# Patient Record
Sex: Female | Born: 1990 | Race: Black or African American | Hispanic: No | Marital: Single | Smoking: Former smoker
Health system: Southern US, Community
[De-identification: ages and names within clinical notes are randomized; demographics above are authoritative.]

## PROBLEM LIST (undated history)

## (undated) DIAGNOSIS — J45909 Unspecified asthma, uncomplicated: Secondary | ICD-10-CM

## (undated) DIAGNOSIS — I10 Essential (primary) hypertension: Secondary | ICD-10-CM

## (undated) DIAGNOSIS — N83209 Unspecified ovarian cyst, unspecified side: Secondary | ICD-10-CM

## (undated) DIAGNOSIS — K2970 Gastritis, unspecified, without bleeding: Secondary | ICD-10-CM

## (undated) DIAGNOSIS — N390 Urinary tract infection, site not specified: Secondary | ICD-10-CM

## (undated) HISTORY — DX: Urinary tract infection, site not specified: N39.0

## (undated) HISTORY — PX: NO PAST SURGERIES: SHX2092

## (undated) HISTORY — DX: Essential (primary) hypertension: I10

---
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
## 2009-08-26 ENCOUNTER — Emergency Department (HOSPITAL_COMMUNITY): Admission: EM | Admit: 2009-08-26 | Discharge: 2009-08-26 | Payer: Self-pay | Admitting: Emergency Medicine

## 2010-09-20 LAB — DIFFERENTIAL
Basophils Absolute: 0.1 10*3/uL (ref 0.0–0.1)
Basophils Relative: 0 % (ref 0–1)
Eosinophils Absolute: 0 10*3/uL (ref 0.0–0.7)
Eosinophils Relative: 0 % (ref 0–5)
Lymphocytes Relative: 3 % — ABNORMAL LOW (ref 12–46)
Lymphs Abs: 0.6 10*3/uL — ABNORMAL LOW (ref 0.7–4.0)
Monocytes Absolute: 0.8 10*3/uL (ref 0.1–1.0)
Monocytes Relative: 4 % (ref 3–12)
Neutro Abs: 19 10*3/uL — ABNORMAL HIGH (ref 1.7–7.7)
Neutrophils Relative %: 93 % — ABNORMAL HIGH (ref 43–77)

## 2010-09-20 LAB — URINALYSIS, ROUTINE W REFLEX MICROSCOPIC
Bilirubin Urine: NEGATIVE
Glucose, UA: NEGATIVE mg/dL
Nitrite: NEGATIVE
Protein, ur: 30 mg/dL — AB
Specific Gravity, Urine: 1.02 (ref 1.005–1.030)
Urobilinogen, UA: 0.2 mg/dL (ref 0.0–1.0)
pH: 6 (ref 5.0–8.0)

## 2010-09-20 LAB — WET PREP, GENITAL
Trich, Wet Prep: NONE SEEN
Yeast Wet Prep HPF POC: NONE SEEN

## 2010-09-20 LAB — URINE CULTURE: Colony Count: 30000

## 2010-09-20 LAB — CBC
HCT: 37.8 % (ref 36.0–46.0)
Hemoglobin: 12.2 g/dL (ref 12.0–15.0)
MCHC: 32.3 g/dL (ref 30.0–36.0)
MCV: 81.5 fL (ref 78.0–100.0)
Platelets: 339 10*3/uL (ref 150–400)
RBC: 4.63 MIL/uL (ref 3.87–5.11)
RDW: 13.4 % (ref 11.5–15.5)
WBC: 20.5 10*3/uL — ABNORMAL HIGH (ref 4.0–10.5)

## 2010-09-20 LAB — URINE MICROSCOPIC-ADD ON

## 2010-09-20 LAB — POCT PREGNANCY, URINE: Preg Test, Ur: NEGATIVE

## 2010-09-20 LAB — POCT I-STAT, CHEM 8
BUN: 7 mg/dL (ref 6–23)
Calcium, Ion: 1.13 mmol/L (ref 1.12–1.32)
Chloride: 106 mEq/L (ref 96–112)
Creatinine, Ser: 0.8 mg/dL (ref 0.4–1.2)
Glucose, Bld: 101 mg/dL — ABNORMAL HIGH (ref 70–99)
HCT: 40 % (ref 36.0–46.0)
Hemoglobin: 13.6 g/dL (ref 12.0–15.0)
Potassium: 3.6 mEq/L (ref 3.5–5.1)
Sodium: 139 mEq/L (ref 135–145)
TCO2: 24 mmol/L (ref 0–100)

## 2010-09-20 LAB — GC/CHLAMYDIA PROBE AMP, GENITAL
Chlamydia, DNA Probe: NEGATIVE
GC Probe Amp, Genital: POSITIVE — AB

## 2010-10-14 ENCOUNTER — Emergency Department (HOSPITAL_COMMUNITY)
Admission: EM | Admit: 2010-10-14 | Discharge: 2010-10-14 | Disposition: A | Discharge status: Home / Self Care | Payer: 59 | Attending: Emergency Medicine | Admitting: Emergency Medicine

## 2010-10-14 DIAGNOSIS — N949 Unspecified condition associated with female genital organs and menstrual cycle: Secondary | ICD-10-CM | POA: Insufficient documentation

## 2010-10-14 DIAGNOSIS — N925 Other specified irregular menstruation: Secondary | ICD-10-CM | POA: Insufficient documentation

## 2010-10-14 DIAGNOSIS — N938 Other specified abnormal uterine and vaginal bleeding: Secondary | ICD-10-CM | POA: Insufficient documentation

## 2010-10-14 LAB — WET PREP, GENITAL
Trich, Wet Prep: NONE SEEN
Yeast Wet Prep HPF POC: NONE SEEN

## 2010-10-14 LAB — POCT I-STAT, CHEM 8
BUN: 9 mg/dL (ref 6–23)
Calcium, Ion: 1.19 mmol/L (ref 1.12–1.32)
Chloride: 104 mEq/L (ref 96–112)
Creatinine, Ser: 0.8 mg/dL (ref 0.4–1.2)
Glucose, Bld: 99 mg/dL (ref 70–99)
HCT: 34 % — ABNORMAL LOW (ref 36.0–46.0)
Hemoglobin: 11.6 g/dL — ABNORMAL LOW (ref 12.0–15.0)
Potassium: 4.1 mEq/L (ref 3.5–5.1)
Sodium: 140 mEq/L (ref 135–145)
TCO2: 23 mmol/L (ref 0–100)

## 2010-10-14 LAB — URINALYSIS, ROUTINE W REFLEX MICROSCOPIC
Bilirubin Urine: NEGATIVE
Glucose, UA: NEGATIVE mg/dL
Ketones, ur: NEGATIVE mg/dL
Leukocytes, UA: NEGATIVE
Nitrite: NEGATIVE
Protein, ur: 30 mg/dL — AB
Specific Gravity, Urine: 1.026 (ref 1.005–1.030)
Urobilinogen, UA: 1 mg/dL (ref 0.0–1.0)
pH: 8 (ref 5.0–8.0)

## 2010-10-14 LAB — URINE MICROSCOPIC-ADD ON

## 2010-10-14 LAB — POCT PREGNANCY, URINE: Preg Test, Ur: NEGATIVE

## 2010-10-15 LAB — GC/CHLAMYDIA PROBE AMP, GENITAL
Chlamydia, DNA Probe: NEGATIVE
GC Probe Amp, Genital: NEGATIVE

## 2011-08-14 ENCOUNTER — Encounter: Payer: Self-pay | Admitting: Internal Medicine

## 2011-08-14 DIAGNOSIS — Z0000 Encounter for general adult medical examination without abnormal findings: Secondary | ICD-10-CM | POA: Insufficient documentation

## 2011-08-19 ENCOUNTER — Ambulatory Visit (INDEPENDENT_AMBULATORY_CARE_PROVIDER_SITE_OTHER): Payer: 59 | Admitting: Internal Medicine

## 2011-08-19 ENCOUNTER — Encounter: Payer: Self-pay | Admitting: Internal Medicine

## 2011-08-19 ENCOUNTER — Other Ambulatory Visit (INDEPENDENT_AMBULATORY_CARE_PROVIDER_SITE_OTHER): Payer: 59

## 2011-08-19 VITALS — BP 122/80 | HR 79 | Temp 98.1°F | Ht <= 58 in | Wt 129.5 lb

## 2011-08-19 DIAGNOSIS — Z0000 Encounter for general adult medical examination without abnormal findings: Secondary | ICD-10-CM

## 2011-08-19 LAB — URINALYSIS, ROUTINE W REFLEX MICROSCOPIC
Bilirubin Urine: NEGATIVE
Hgb urine dipstick: NEGATIVE
Ketones, ur: NEGATIVE
Leukocytes, UA: NEGATIVE
Nitrite: POSITIVE
Specific Gravity, Urine: 1.015 (ref 1.000–1.030)
Total Protein, Urine: NEGATIVE
Urine Glucose: NEGATIVE
Urobilinogen, UA: 0.2 (ref 0.0–1.0)
pH: 7 (ref 5.0–8.0)

## 2011-08-19 LAB — BASIC METABOLIC PANEL
BUN: 6 mg/dL (ref 6–23)
CO2: 27 mEq/L (ref 19–32)
Calcium: 9 mg/dL (ref 8.4–10.5)
Chloride: 103 mEq/L (ref 96–112)
Creatinine, Ser: 0.8 mg/dL (ref 0.4–1.2)
GFR: 113.92 mL/min (ref >60.00)
Glucose, Bld: 83 mg/dL (ref 70–99)
Potassium: 4.3 mEq/L (ref 3.5–5.1)
Sodium: 136 mEq/L (ref 135–145)

## 2011-08-19 LAB — HEPATIC FUNCTION PANEL
ALT: 17 U/L (ref 0–35)
AST: 28 U/L (ref 0–37)
Albumin: 4 g/dL (ref 3.5–5.2)
Alkaline Phosphatase: 60 U/L (ref 39–117)
Bilirubin, Direct: 0.1 mg/dL (ref 0.0–0.3)
Total Bilirubin: 0.5 mg/dL (ref 0.3–1.2)
Total Protein: 7.8 g/dL (ref 6.0–8.3)

## 2011-08-19 LAB — CBC WITH DIFFERENTIAL/PLATELET
Basophils Absolute: 0 10*3/uL (ref 0.0–0.1)
Basophils Relative: 0.6 % (ref 0.0–3.0)
Eosinophils Absolute: 0.4 10*3/uL (ref 0.0–0.7)
Eosinophils Relative: 6.1 % — ABNORMAL HIGH (ref 0.0–5.0)
HCT: 35.9 % — ABNORMAL LOW (ref 36.0–46.0)
Hemoglobin: 11.4 g/dL — ABNORMAL LOW (ref 12.0–15.0)
Lymphocytes Relative: 44.5 % (ref 12.0–46.0)
Lymphs Abs: 2.9 10*3/uL (ref 0.7–4.0)
MCHC: 31.6 g/dL (ref 30.0–36.0)
MCV: 76.9 fl — ABNORMAL LOW (ref 78.0–100.0)
Monocytes Absolute: 0.4 10*3/uL (ref 0.1–1.0)
Monocytes Relative: 5.7 % (ref 3.0–12.0)
Neutro Abs: 2.8 10*3/uL (ref 1.4–7.7)
Neutrophils Relative %: 43.1 % (ref 43.0–77.0)
Platelets: 378 10*3/uL (ref 150.0–400.0)
RBC: 4.67 Mil/uL (ref 3.87–5.11)
RDW: 17.6 % — ABNORMAL HIGH (ref 11.5–14.6)
WBC: 6.5 10*3/uL (ref 4.5–10.5)

## 2011-08-19 LAB — LIPID PANEL
Cholesterol: 169 mg/dL (ref 0–200)
HDL: 79.7 mg/dL (ref >39.00)
LDL Cholesterol: 81 mg/dL (ref 0–99)
Total CHOL/HDL Ratio: 2
Triglycerides: 41 mg/dL (ref 0.0–149.0)
VLDL: 8.2 mg/dL (ref 0.0–40.0)

## 2011-08-19 LAB — TSH: TSH: 0.59 u[IU]/mL (ref 0.35–5.50)

## 2011-08-19 NOTE — Progress Notes (Signed)
  Subjective:    Patient ID: Andrea Daniels, female    DOB: 1991-02-02, 21 y.o.   MRN: [id number]  HPI  Here for wellness and f/u;  Overall doing ok;  Pt denies CP, worsening SOB, DOE, wheezing, orthopnea, PND, worsening LE edema, palpitations, dizziness or syncope.  Pt denies neurological change such as new Headache, facial or extremity weakness.  Pt denies polydipsia, polyuria, or low sugar symptoms. Pt states overall good compliance with treatment and medications, good tolerability, and trying to follow lower cholesterol diet.  Pt denies worsening depressive symptoms, suicidal ideation or panic. No fever, wt loss, night sweats, loss of appetite, or other constitutional symptoms.  Pt states good ability with ADL's, low fall risk, home safety reviewed and adequate, no significant changes in hearing or vision, and occasionally active with exercise.  No other complaints.  Due for pap. Past Medical History  Diagnosis Date  . UTI (lower urinary tract infection)    History reviewed. No pertinent past surgical history.  reports that she has been smoking.  She does not have any smokeless tobacco history on file. She reports that she drinks alcohol. She reports that she does not use illicit drugs. family history includes Arthritis in her other; Cancer in her other; Diabetes in her other; Hyperlipidemia in her other; and Hypertension in her others. No Known Allergies No current outpatient prescriptions on file prior to visit.   Review of Systems Review of Systems  Constitutional: Negative for diaphoresis, activity change, appetite change and unexpected weight change.  HENT: Negative for hearing loss, ear pain, facial swelling, mouth sores and neck stiffness.   Eyes: Negative for pain, redness and visual disturbance.  Respiratory: Negative for shortness of breath and wheezing.   Cardiovascular: Negative for chest pain and palpitations.  Gastrointestinal: Negative for diarrhea, blood in stool, abdominal  distention and rectal pain.  Genitourinary: Negative for hematuria, flank pain and decreased urine volume.  Musculoskeletal: Negative for myalgias and joint swelling.  Skin: Negative for color change and wound.  Neurological: Negative for syncope and numbness.  Hematological: Negative for adenopathy.  Psychiatric/Behavioral: Negative for hallucinations, self-injury, decreased concentration and agitation.      Objective:   Physical Exam BP 122/80  Pulse 79  Temp(Src) 98.1 F (36.7 C) (Oral)  Ht 4\' 10"  (1.473 m)  Wt 129 lb 8 oz (58.741 kg)  BMI 27.07 kg/m2  SpO2 98%  LMP 07/29/2011 Physical Exam  VS noted Constitutional: Pt is oriented to person, place, and time. Appears well-developed and well-nourished.  HENT:  Head: Normocephalic and atraumatic.  Right Ear: External ear normal.  Left Ear: External ear normal.  Nose: Nose normal.  Mouth/Throat: Oropharynx is clear and moist.  Eyes: Conjunctivae and EOM are normal. Pupils are equal, round, and reactive to light.  Neck: Normal range of motion. Neck supple. No JVD present. No tracheal deviation present.  Cardiovascular: Normal rate, regular rhythm, normal heart sounds and intact distal pulses.   Pulmonary/Chest: Effort normal and breath sounds normal.  Abdominal: Soft. Bowel sounds are normal. There is no tenderness.  Musculoskeletal: Normal range of motion. Exhibits no edema.  Lymphadenopathy:  Has no cervical adenopathy.  Neurological: Pt is alert and oriented to person, place, and time. Pt has normal reflexes. No cranial nerve deficit.  Skin: Skin is warm and dry. No rash noted.  Psychiatric:  Has  normal mood and affect. Behavior is normal.     Assessment & Plan:

## 2011-08-19 NOTE — Patient Instructions (Addendum)
No new medications or issues today Please remember to followup with your GYN for the yearly pap smear Please stop smoking (you can call for Chantix if you want, as we discussed) You are otherwise up to date with prevention Please go to LAB in the Basement for the blood and/or urine tests to be done today Please call the phone number [phone] (the [employer] System) for results of testing in 2-3 days;  When calling, simply dial the number, and when prompted enter the MRN number above (the Medical Record Number) and the # key, then the message should start. You can return as needed, and remember you are no considered an "active" patient here for the next 3 yrs

## 2011-08-19 NOTE — Assessment & Plan Note (Signed)
Overall doing well, age appropriate education and counseling updated, referrals for preventative services and immunizations addressed, dietary and smoking counseling addressed, most recent labs and ECG reviewed.  I have personally reviewed and have noted: 1) the patient's medical and social history 2) The pt's use of alcohol, tobacco, and illicit drugs 3) The patient's current medications and supplements 4) Functional ability including ADL's, fall risk, home safety risk, hearing and visual impairment 5) Diet and physical activities 6) Evidence for depression or mood disorder 7) The patient's height, weight, and BMI have been recorded in the chart I have made referrals, and provided counseling and education based on review of the above Due for pap, pt to call GYN.  Also for chantix for smoking if she requests, d/w pt today.

## 2012-03-03 ENCOUNTER — Emergency Department (HOSPITAL_BASED_OUTPATIENT_CLINIC_OR_DEPARTMENT_OTHER)
Admission: EM | Admit: 2012-03-03 | Discharge: 2012-03-03 | Disposition: A | Discharge status: Home / Self Care | Payer: 59 | Attending: Emergency Medicine | Admitting: Emergency Medicine

## 2012-03-03 ENCOUNTER — Encounter (HOSPITAL_BASED_OUTPATIENT_CLINIC_OR_DEPARTMENT_OTHER): Payer: Self-pay | Admitting: Emergency Medicine

## 2012-03-03 DIAGNOSIS — F17200 Nicotine dependence, unspecified, uncomplicated: Secondary | ICD-10-CM | POA: Insufficient documentation

## 2012-03-03 DIAGNOSIS — Z853 Personal history of malignant neoplasm of breast: Secondary | ICD-10-CM | POA: Insufficient documentation

## 2012-03-03 DIAGNOSIS — E119 Type 2 diabetes mellitus without complications: Secondary | ICD-10-CM | POA: Insufficient documentation

## 2012-03-03 DIAGNOSIS — I10 Essential (primary) hypertension: Secondary | ICD-10-CM | POA: Insufficient documentation

## 2012-03-03 DIAGNOSIS — A499 Bacterial infection, unspecified: Secondary | ICD-10-CM | POA: Insufficient documentation

## 2012-03-03 DIAGNOSIS — N760 Acute vaginitis: Secondary | ICD-10-CM | POA: Insufficient documentation

## 2012-03-03 DIAGNOSIS — B9689 Other specified bacterial agents as the cause of diseases classified elsewhere: Secondary | ICD-10-CM | POA: Insufficient documentation

## 2012-03-03 DIAGNOSIS — N946 Dysmenorrhea, unspecified: Secondary | ICD-10-CM | POA: Insufficient documentation

## 2012-03-03 LAB — CBC WITH DIFFERENTIAL/PLATELET
Basophils Absolute: 0.1 10*3/uL (ref 0.0–0.1)
Basophils Relative: 1 % (ref 0–1)
Eosinophils Absolute: 0.3 10*3/uL (ref 0.0–0.7)
Eosinophils Relative: 5 % (ref 0–5)
HCT: 35.4 % — ABNORMAL LOW (ref 36.0–46.0)
Hemoglobin: 12.1 g/dL (ref 12.0–15.0)
Lymphocytes Relative: 23 % (ref 12–46)
Lymphs Abs: 1.5 10*3/uL (ref 0.7–4.0)
MCH: 26.5 pg (ref 26.0–34.0)
MCHC: 34.2 g/dL (ref 30.0–36.0)
MCV: 77.6 fL — ABNORMAL LOW (ref 78.0–100.0)
Monocytes Absolute: 0.4 10*3/uL (ref 0.1–1.0)
Monocytes Relative: 7 % (ref 3–12)
Neutro Abs: 4.1 10*3/uL (ref 1.7–7.7)
Neutrophils Relative %: 64 % (ref 43–77)
Platelets: 156 10*3/uL (ref 150–400)
RBC: 4.56 MIL/uL (ref 3.87–5.11)
RDW: 13.9 % (ref 11.5–15.5)
WBC: 6.4 10*3/uL (ref 4.0–10.5)

## 2012-03-03 LAB — BASIC METABOLIC PANEL
BUN: 7 mg/dL (ref 6–23)
CO2: 20 mEq/L (ref 19–32)
Calcium: 8.3 mg/dL — ABNORMAL LOW (ref 8.4–10.5)
Chloride: 105 mEq/L (ref 96–112)
Creatinine, Ser: 0.8 mg/dL (ref 0.50–1.10)
GFR calc Af Amer: >90 mL/min (ref >90)
GFR calc non Af Amer: >90 mL/min (ref >90)
Glucose, Bld: 105 mg/dL — ABNORMAL HIGH (ref 70–99)
Potassium: 4.3 mEq/L (ref 3.5–5.1)
Sodium: 138 mEq/L (ref 135–145)

## 2012-03-03 LAB — URINALYSIS, ROUTINE W REFLEX MICROSCOPIC
Bilirubin Urine: NEGATIVE
Glucose, UA: NEGATIVE mg/dL
Ketones, ur: NEGATIVE mg/dL
Leukocytes, UA: NEGATIVE
Nitrite: NEGATIVE
Protein, ur: NEGATIVE mg/dL
Specific Gravity, Urine: 1.012 (ref 1.005–1.030)
Urobilinogen, UA: 0.2 mg/dL (ref 0.0–1.0)
pH: 7 (ref 5.0–8.0)

## 2012-03-03 LAB — URINE MICROSCOPIC-ADD ON

## 2012-03-03 LAB — WET PREP, GENITAL
Trich, Wet Prep: NONE SEEN
Yeast Wet Prep HPF POC: NONE SEEN

## 2012-03-03 LAB — PREGNANCY, URINE: Preg Test, Ur: NEGATIVE

## 2012-03-03 LAB — LACTIC ACID, PLASMA: Lactic Acid, Venous: 3.1 mmol/L — ABNORMAL HIGH (ref 0.5–2.2)

## 2012-03-03 MED ORDER — ONDANSETRON HCL 4 MG/2ML IJ SOLN
4.0000 mg | Freq: Once | INTRAMUSCULAR | Status: AC
  Administered 2012-03-03: 4 mg via INTRAVENOUS
  Filled 2012-03-03: qty 2

## 2012-03-03 MED ORDER — IBUPROFEN 800 MG PO TABS: 800.0000 mg | ORAL_TABLET | Freq: Three times a day (TID) | ORAL | Status: AC

## 2012-03-03 MED ORDER — ONDANSETRON HCL 4 MG/2ML IJ SOLN
INTRAMUSCULAR | Status: AC
  Administered 2012-03-03: 4 mg via INTRAVENOUS
  Filled 2012-03-03: qty 2

## 2012-03-03 MED ORDER — SODIUM CHLORIDE 0.9 % IV BOLUS (SEPSIS)
1000.0000 mL | Freq: Once | INTRAVENOUS | Status: AC
  Administered 2012-03-03: 1000 mL via INTRAVENOUS

## 2012-03-03 MED ORDER — METRONIDAZOLE 500 MG PO TABS: 500.0000 mg | ORAL_TABLET | Freq: Two times a day (BID) | ORAL | Status: AC

## 2012-03-03 MED ORDER — KETOROLAC TROMETHAMINE 30 MG/ML IJ SOLN
30.0000 mg | Freq: Once | INTRAMUSCULAR | Status: AC
  Administered 2012-03-03: 30 mg via INTRAVENOUS
  Filled 2012-03-03: qty 1

## 2012-03-03 NOTE — ED Notes (Addendum)
Patient c/o severe menstrual cramps, pelvic pain, states she has a Hx of severe cramps and today is the worse.

## 2012-03-05 LAB — GC/CHLAMYDIA PROBE AMP, GENITAL
Chlamydia, DNA Probe: NEGATIVE
GC Probe Amp, Genital: NEGATIVE

## 2012-03-08 NOTE — ED Provider Notes (Signed)
History     CSN: [id number]  Arrival date & time 03/03/12  1359   First MD Initiated Contact with Patient 03/03/12 1416      Chief Complaint  Patient presents with  . Menstrual Problem    (Consider location/radiation/quality/duration/timing/severity/associated sxs/prior treatment) HPI Patient presented by EMS shivering and complaining of 10/10 bilateral lower abdominal menstrual cramps beginning 2 hours to presentation.  Shivering was distractible and abnormal initial vital signs taken during extreme patient behavior normalized with patient relaxation and repeated measurement.  Cessation of shaking movements and vital sign improvement were precipitated by notifying patient of need to perform rectal temp if chattering of teeth could not cease to perform oral temp.  Patient reported taking midol at home without relief.  She endorsed history of anemia but has never received blood transfusion for this.  Patient denied vaginal discharge other than menstrual bleeding and denied possibility of pregnancy.  Endorsed nausea but denied vomiting.  Denied other GI symptoms and urinary symptoms.  No recent sick contacts, cough, fever, chest pain, or shortness of breath.  Nothing makes pain better and palpation worsens it.  No radiation of pain.  There are no other associated or modifying factors.  Past Medical History  Diagnosis Date  . UTI (lower urinary tract infection)     History reviewed. No pertinent past surgical history.  Family History  Problem Relation Age of Onset  . Hyperlipidemia Other   . Hypertension Other   . Arthritis Other   . Cancer Other     breast cancer  . Hypertension Other   . Diabetes Other     History  Substance Use Topics  . Smoking status: Current Every Day Smoker  . Smokeless tobacco: Not on file  . Alcohol Use: Yes    OB History    Grav Para Term Preterm Abortions TAB SAB Ect Mult Living                  Review of Systems  Constitutional: Positive for  chills.  HENT: Negative.   Eyes: Negative.   Respiratory: Negative.   Cardiovascular: Negative.   Gastrointestinal: Positive for abdominal pain.  Genitourinary: Positive for vaginal bleeding and menstrual problem.  Musculoskeletal: Negative.   Skin: Negative.   Neurological: Negative.   Hematological: Negative.   Psychiatric/Behavioral: Negative.   All other systems reviewed and are negative.    Allergies  Review of patient's allergies indicates no known allergies.  Home Medications   Current Outpatient Rx  Name Route Sig Dispense Refill  . IBUPROFEN 200 MG PO TABS Oral Take 400-600 mg by mouth as needed.     . IBUPROFEN 800 MG PO TABS Oral Take 1 tablet (800 mg total) by mouth 3 (three) times daily. 30 tablet 0    Please take on a regular basis during your menstru ...  . METRONIDAZOLE 500 MG PO TABS Oral Take 1 tablet (500 mg total) by mouth 2 (two) times daily. 14 tablet 0    BP 124/76  Pulse 74  Temp 97.4 F (36.3 C) (Oral)  Resp 20  SpO2 100%  Physical Exam  Nursing note and vitals reviewed. GEN: Well-developed, well-nourished female shaking on the stretcher and minimally cooperative initially with improved verbal response and cessation of shaking upon notification of possible need for rectal temp HEENT: Atraumatic, normocephalic. Oropharynx clear without erythema EYES: PERRLA BL, no scleral icterus. NECK: Trachea midline, no meningismus CV: regular rate and rhythm. No murmurs, rubs, or gallops PULM: No respiratory distress.  No crackles, wheezes, or rales. GI: soft, mild BL lower quadrant TTP. No guarding, rebound, or distension. + bowel sounds  GU: moderate vaginal bleeding, no CMT or adnexal TTP, specimens collected Neuro: cranial nerves grossly 2-12 intact, no abnormalities of strength or sensation, A and O x 3 MSK: Patient moves all 4 extremities symmetrically, no deformity, edema, or injury noted Skin: No rashes petechiae, purpura, or jaundice Psych: no  abnormality of mood   ED Course  Procedures (including critical care time)  Labs Reviewed  CBC WITH DIFFERENTIAL - Abnormal; Notable for the following:    HCT 35.4 (*)     MCV 77.6 (*)     All other components within normal limits  BASIC METABOLIC PANEL - Abnormal; Notable for the following:    Glucose, Bld 105 (*)     Calcium 8.3 (*)     All other components within normal limits  URINALYSIS, ROUTINE W REFLEX MICROSCOPIC - Abnormal; Notable for the following:    Hgb urine dipstick LARGE (*)     All other components within normal limits  LACTIC ACID, PLASMA - Abnormal; Notable for the following:    Lactic Acid, Venous 3.1 (*)     All other components within normal limits  WET PREP, GENITAL - Abnormal; Notable for the following:    Clue Cells Wet Prep HPF POC FEW (*)     WBC, Wet Prep HPF POC FEW (*)     All other components within normal limits  URINE MICROSCOPIC-ADD ON - Abnormal; Notable for the following:    Bacteria, UA FEW (*)     All other components within normal limits  PREGNANCY, URINE  GC/CHLAMYDIA PROBE AMP, GENITAL   No results found.   1. BV (bacterial vaginosis)   2. Menstrual cramps       MDM  Patient evaluated by myself and treated initially with IVF.  No anemia, leukocytosis, electrolyte abnormalities noted on labs.  Inadvertently elevated lactate performed and elevated.  Likely due to patient repeated shaking movements vs lab error.  Not repeated as vitals and patient condition continued to improve.  Patient received a total of 2 L of NS and pain treated with toradol after u preg neg.  RBCs on UA due to menstrual blood.  Pelvic unremarkable aside from menstrual bleeding.  Patient had clue cells noted on exam but denied vaginal discharge.  Discharged with Rx for flagyl in case d/c noted after completion of period.  Patient advised to follow-up with her OB and to start taking ibuprofen 800 mg po TID 2 days before her period is scheduled to start in the future  to prevent severe cramping.  Patient stated understanding and was discharged in good condition.        Cyndra Numbers, MD 03/08/12 1225

## 2012-04-24 ENCOUNTER — Ambulatory Visit: Payer: 59 | Admitting: Internal Medicine

## 2012-04-24 ENCOUNTER — Encounter: Payer: Self-pay | Admitting: Internal Medicine

## 2012-04-24 ENCOUNTER — Ambulatory Visit (INDEPENDENT_AMBULATORY_CARE_PROVIDER_SITE_OTHER): Payer: 59 | Admitting: Internal Medicine

## 2012-04-24 VITALS — BP 110/80 | HR 98 | Temp 97.4°F | Ht 59.0 in | Wt 129.5 lb

## 2012-04-24 DIAGNOSIS — J309 Allergic rhinitis, unspecified: Secondary | ICD-10-CM

## 2012-04-24 DIAGNOSIS — R062 Wheezing: Secondary | ICD-10-CM

## 2012-04-24 DIAGNOSIS — J029 Acute pharyngitis, unspecified: Secondary | ICD-10-CM

## 2012-04-24 MED ORDER — ALBUTEROL SULFATE HFA 108 (90 BASE) MCG/ACT IN AERS: 2.0000 | INHALATION_SPRAY | Freq: Four times a day (QID) | RESPIRATORY_TRACT | Status: DC | PRN

## 2012-04-24 MED ORDER — HYDROCODONE-HOMATROPINE 5-1.5 MG/5ML PO SYRP: 5.0000 mL | ORAL_SOLUTION | Freq: Four times a day (QID) | ORAL | Status: DC | PRN

## 2012-04-24 MED ORDER — AZITHROMYCIN 250 MG PO TABS: ORAL_TABLET | ORAL | Status: DC

## 2012-04-24 MED ORDER — PREDNISONE 10 MG PO TABS: ORAL_TABLET | ORAL | Status: DC

## 2012-04-24 NOTE — Patient Instructions (Addendum)
Take all new medications as prescribed - the antibiotic, cough medicine, prednisone, and inhaler Continue all other medications as before You may not need the inhaler during the winter, but you may need to re-start in the spring If you are using the Albuterol Inhaler more than 2-3 times per week in the future after this illness, you may want to also take Advair as this will treat the wheezing better You can also take Allegra OTC for allergy symptoms Please remember to sign up for My Chart at your earliest convenience, as this will be important to you in the future with finding out test results.

## 2012-04-24 NOTE — Assessment & Plan Note (Signed)
Ok also for [employer] prn,  to f/u any worsening symptoms or concerns

## 2012-04-24 NOTE — Assessment & Plan Note (Signed)
I suspect new for her  Mild intermittent asthma prior to onset more recent infecitous symtpoms, for alb MDI prn, consider add advair , for short course predpack now

## 2012-04-24 NOTE — Assessment & Plan Note (Signed)
Mild to mod, for antibx course,  to f/u any worsening symptoms or concerns 

## 2012-04-24 NOTE — Progress Notes (Signed)
  Subjective:    Patient ID: Andrea Daniels, female    DOB: 08-11-1990, 21 y.o.   MRN: [id number]  HPI   Here with 3 days acute onset fever, facial pain, pressure, general weakness and malaise,  with severe ST worse on the left with left neck lumps and tenderness as well,and Pt denies chest pain, increased sob or doe, wheezing, orthopnea, PND, increased LE swelling, palpitations, dizziness or syncope. Pt denies new neurological symptoms such as new headache, or facial or extremity weakness or numbness  Pt denies polydipsia, polyuria.   Does have several wks ongoing nasal allergy symptoms with clear congestion, itch and sneeze, without fever, pain, ST, but has had intemittent non prod cough and wheezing that has sometimes woke her up at night. Past Medical History  Diagnosis Date  . UTI (lower urinary tract infection)    No past surgical history on file.  reports that she has been smoking.  She does not have any smokeless tobacco history on file. She reports that she drinks alcohol. She reports that she does not use illicit drugs. family history includes Arthritis in her other; Cancer in her other; Diabetes in her other; Hyperlipidemia in her other; and Hypertension in her others. No Known Allergies Current Outpatient Prescriptions on File Prior to Visit  Medication Sig Dispense Refill  . albuterol (PROVENTIL HFA;VENTOLIN HFA) 108 (90 BASE) MCG/ACT inhaler Inhale 2 puffs into the lungs every 6 (six) hours as needed for wheezing.  1 Inhaler  2  . ibuprofen (ADVIL,MOTRIN) 200 MG tablet Take 400-600 mg by mouth as needed.        Review of Systems  Constitutional: Negative for diaphoresis and unexpected weight change.  HENT: Negative for tinnitus.   Eyes: Negative for photophobia and visual disturbance.  Respiratory: Negative for choking and stridor.   Gastrointestinal: Negative for vomiting and blood in stool.  Genitourinary: Negative for hematuria and decreased urine volume.  Musculoskeletal:  Negative for gait problem.  Skin: Negative for color change and wound.  Neurological: Negative for tremors and numbness.  Psychiatric/Behavioral: Negative for decreased concentration. The patient is not hyperactive.       Objective:   Physical Exam BP 110/80  Pulse 98  Temp 97.4 F (36.3 C) (Oral)  Ht 4\' 11"  (1.499 m)  Wt 129 lb 8 oz (58.741 kg)  BMI 26.16 kg/m2  SpO2 97% Physical Exam  VS noted, mild ill Constitutional: Pt appears well-developed and well-nourished.  HENT: Head: Normocephalic.  Right Ear: External ear normal.  Left Ear: External ear normal.  Bilat tm's mild erythema.  Sinus nontender.  Pharynx severe erythema, tonsil enlarged with white areas Eyes: Conjunctivae and EOM are normal. Pupils are equal, round, and reactive to light.  Neck: Normal range of motion. Neck supple.  Cardiovascular: Normal rate and regular rhythm.   Pulmonary/Chest: Effort normal and breath sounds decreased bilat, few wheeze bilat.  Neurological: Pt is alert. Not confused  Skin: Skin is warm. No erythema.  Psychiatric: Pt behavior is normal. Thought content normal.     Assessment & Plan:

## 2012-08-09 ENCOUNTER — Ambulatory Visit: Payer: 59 | Admitting: Internal Medicine

## 2012-08-16 ENCOUNTER — Ambulatory Visit (INDEPENDENT_AMBULATORY_CARE_PROVIDER_SITE_OTHER): Payer: 59 | Admitting: Internal Medicine

## 2012-08-16 ENCOUNTER — Encounter: Payer: Self-pay | Admitting: Internal Medicine

## 2012-08-16 ENCOUNTER — Ambulatory Visit (INDEPENDENT_AMBULATORY_CARE_PROVIDER_SITE_OTHER)
Admission: RE | Admit: 2012-08-16 | Discharge: 2012-08-16 | Disposition: A | Discharge status: Home / Self Care | Payer: 59 | Source: Ambulatory Visit | Attending: Internal Medicine | Admitting: Internal Medicine

## 2012-08-16 ENCOUNTER — Ambulatory Visit: Payer: 59 | Admitting: Internal Medicine

## 2012-08-16 ENCOUNTER — Other Ambulatory Visit (INDEPENDENT_AMBULATORY_CARE_PROVIDER_SITE_OTHER): Payer: 59

## 2012-08-16 VITALS — BP 130/90 | HR 101 | Temp 97.9°F

## 2012-08-16 DIAGNOSIS — R062 Wheezing: Secondary | ICD-10-CM

## 2012-08-16 DIAGNOSIS — Z0000 Encounter for general adult medical examination without abnormal findings: Secondary | ICD-10-CM

## 2012-08-16 DIAGNOSIS — J209 Acute bronchitis, unspecified: Secondary | ICD-10-CM

## 2012-08-16 DIAGNOSIS — R197 Diarrhea, unspecified: Secondary | ICD-10-CM | POA: Insufficient documentation

## 2012-08-16 DIAGNOSIS — R112 Nausea with vomiting, unspecified: Secondary | ICD-10-CM

## 2012-08-16 LAB — CBC WITH DIFFERENTIAL/PLATELET
Basophils Absolute: 0 10*3/uL (ref 0.0–0.1)
Basophils Relative: 0.4 % (ref 0.0–3.0)
Eosinophils Absolute: 0.9 10*3/uL — ABNORMAL HIGH (ref 0.0–0.7)
Eosinophils Relative: 14.1 % — ABNORMAL HIGH (ref 0.0–5.0)
HCT: 41.2 % (ref 36.0–46.0)
Hemoglobin: 13.9 g/dL (ref 12.0–15.0)
Lymphocytes Relative: 17.8 % (ref 12.0–46.0)
Lymphs Abs: 1.2 10*3/uL (ref 0.7–4.0)
MCHC: 33.7 g/dL (ref 30.0–36.0)
MCV: 80.6 fl (ref 78.0–100.0)
Monocytes Absolute: 0.3 10*3/uL (ref 0.1–1.0)
Monocytes Relative: 4.8 % (ref 3.0–12.0)
Neutro Abs: 4.2 10*3/uL (ref 1.4–7.7)
Neutrophils Relative %: 62.9 % (ref 43.0–77.0)
Platelets: 338 10*3/uL (ref 150.0–400.0)
RBC: 5.11 Mil/uL (ref 3.87–5.11)
RDW: 13.8 % (ref 11.5–14.6)
WBC: 6.6 10*3/uL (ref 4.5–10.5)

## 2012-08-16 LAB — URINALYSIS, ROUTINE W REFLEX MICROSCOPIC
Bilirubin Urine: NEGATIVE
Hgb urine dipstick: NEGATIVE
Ketones, ur: NEGATIVE
Leukocytes, UA: NEGATIVE
Nitrite: POSITIVE
Specific Gravity, Urine: 1.02 (ref 1.000–1.030)
Total Protein, Urine: NEGATIVE
Urine Glucose: NEGATIVE
Urobilinogen, UA: 0.2 (ref 0.0–1.0)
pH: 8 (ref 5.0–8.0)

## 2012-08-16 LAB — HEPATIC FUNCTION PANEL
ALT: 15 U/L (ref 0–35)
AST: 24 U/L (ref 0–37)
Albumin: 4.1 g/dL (ref 3.5–5.2)
Alkaline Phosphatase: 59 U/L (ref 39–117)
Bilirubin, Direct: 0.1 mg/dL (ref 0.0–0.3)
Total Bilirubin: 1 mg/dL (ref 0.3–1.2)
Total Protein: 8 g/dL (ref 6.0–8.3)

## 2012-08-16 LAB — BASIC METABOLIC PANEL
BUN: 7 mg/dL (ref 6–23)
CO2: 26 mEq/L (ref 19–32)
Calcium: 9.1 mg/dL (ref 8.4–10.5)
Chloride: 108 mEq/L (ref 96–112)
Creatinine, Ser: 0.8 mg/dL (ref 0.4–1.2)
GFR: 116.2 mL/min (ref >60.00)
Glucose, Bld: 96 mg/dL (ref 70–99)
Potassium: 4.6 mEq/L (ref 3.5–5.1)
Sodium: 140 mEq/L (ref 135–145)

## 2012-08-16 LAB — LIPID PANEL
Cholesterol: 158 mg/dL (ref 0–200)
HDL: 62.1 mg/dL (ref >39.00)
LDL Cholesterol: 90 mg/dL (ref 0–99)
Total CHOL/HDL Ratio: 3
Triglycerides: 32 mg/dL (ref 0.0–149.0)
VLDL: 6.4 mg/dL (ref 0.0–40.0)

## 2012-08-16 LAB — TSH: TSH: 0.98 u[IU]/mL (ref 0.35–5.50)

## 2012-08-16 MED ORDER — HYDROCODONE-HOMATROPINE 5-1.5 MG/5ML PO SYRP: 5.0000 mL | ORAL_SOLUTION | Freq: Four times a day (QID) | ORAL | Status: DC | PRN

## 2012-08-16 MED ORDER — METHYLPREDNISOLONE ACETATE 80 MG/ML IJ SUSP
80.0000 mg | Freq: Once | INTRAMUSCULAR | Status: AC
  Administered 2012-08-16: 80 mg via INTRAMUSCULAR

## 2012-08-16 MED ORDER — ALBUTEROL SULFATE HFA 108 (90 BASE) MCG/ACT IN AERS: 2.0000 | INHALATION_SPRAY | Freq: Four times a day (QID) | RESPIRATORY_TRACT | Status: DC | PRN

## 2012-08-16 MED ORDER — AZITHROMYCIN 250 MG PO TABS: ORAL_TABLET | ORAL | Status: DC

## 2012-08-16 MED ORDER — PREDNISONE 10 MG PO TABS: ORAL_TABLET | ORAL | Status: DC

## 2012-08-16 MED ORDER — ONDANSETRON HCL 4 MG PO TABS: ORAL_TABLET | ORAL | Status: DC

## 2012-08-16 NOTE — Patient Instructions (Addendum)
You had the steroid shot today Please take all new medication as prescribed - the antibiotic, cough medicine, nausea medication, and prednisone Please continue all other medications as before, including the inhaler Please go to the LAB in the Basement (turn left off the elevator) for the tests to be done today You will be contacted by phone if any changes need to be made immediately.  Otherwise, you will receive a letter about your results with an explanation, but please check with MyChart first. Thank you for enrolling in MyChart. Please follow the instructions below to securely access your online medical record. MyChart allows you to send messages to your doctor, view your test results, renew your prescriptions, schedule appointments, and more. To Log into My Chart online, please go by [employer] or [employer] to [email or web address], or download the MyChart App from the [employer] of [employer] .  Your Username is: [email or web address] (pass me-time)

## 2012-08-16 NOTE — Assessment & Plan Note (Signed)
Overall doing well, age appropriate education and counseling updated, referrals for preventative services and immunizations addressed, dietary and smoking counseling addressed, most recent labs reviewed.  I have personally reviewed and have noted: 1) the patient's medical and social history 2) The pt's use of alcohol, tobacco, and illicit drugs 3) The patient's current medications and supplements 4) Functional ability including ADL's, fall risk, home safety risk, hearing and visual impairment 5) Diet and physical activities 6) Evidence for depression or mood disorder 7) The patient's height, weight, and BMI have been recorded in the chart I have made referrals, and provided counseling and education based on review of the above Pt states not pregnant, has f/u planned with GYN

## 2012-08-16 NOTE — Assessment & Plan Note (Signed)
Mild to mod, for depomedrol 880 mg, predpac asd,  to f/u any worsening symptoms or concerns

## 2012-08-16 NOTE — Progress Notes (Signed)
Subjective:    Patient ID: Andrea Daniels, female    DOB: 06/22/1991, 22 y.o.   MRN: [id number]  HPI  Here for wellness and f/u;  Overall doing ok;  Pt denies CP, worsening SOB, DOE, wheezing, orthopnea, PND, worsening LE edema, palpitations, dizziness or syncope,, except for fever, nonprod cough, and mild wheezing for 3-4 days with mild sob/doe  Pt denies neurological change such as new headache, facial or extremity weakness.  Pt denies polydipsia, polyuria, or low sugar symptoms. Pt states overall good compliance with treatment and medications, good tolerability.  Pt denies worsening depressive symptoms, suicidal ideation or panic. No night sweats, wt loss, loss of appetite, or other constitutional symptoms.  Pt states good ability with ADL's, home safety reviewed and adequate, no other significant changes in hearing or vision, and only occasionally active with exercise. Did have an episode of n/v last night as well but Denies worsening reflux, abd pain, dysphagia, n/v, bowel change or blood. Past Medical History  Diagnosis Date  . UTI (lower urinary tract infection)    No past surgical history on file.  reports that she has been smoking.  She does not have any smokeless tobacco history on file. She reports that  drinks alcohol. She reports that she does not use illicit drugs. family history includes Arthritis in her other; Cancer in her other; Diabetes in her other; Hyperlipidemia in her other; and Hypertension in her others. No Known Allergies No current outpatient prescriptions on file prior to visit.   No current facility-administered medications on file prior to visit.   Review of Systems VS noted,  Constitutional: Pt is oriented to person, place, and time. Appears well-developed and well-nourished.  Head: Normocephalic and atraumatic.  Right Ear: External ear normal.  Left Ear: External ear normal.  Nose: Nose normal.  Mouth/Throat: Oropharynx is clear and moist.  Eyes: Conjunctivae  and EOM are normal. Pupils are equal, round, and reactive to light.  Neck: Normal range of motion. Neck supple. No JVD present. No tracheal deviation present.  Cardiovascular: Normal rate, regular rhythm, normal heart sounds and intact distal pulses.   Pulmonary/Chest: Effort normal and breath sounds normal.  Abdominal: Soft. Bowel sounds are normal. There is no tenderness. No HSM  Musculoskeletal: Normal range of motion. Exhibits no edema.  Lymphadenopathy:  Has no cervical adenopathy.  Neurological: Pt is alert and oriented to person, place, and time. Pt has normal reflexes. No cranial nerve deficit.  Skin: Skin is warm and dry. No rash noted.  Psychiatric:  Has  normal mood and affect. Behavior is normal.      Objective:   Physical Exam BP 130/90  Pulse 101  Temp(Src) 97.9 F (36.6 C) (Oral)  LMP 08/12/2012 VS noted, mild ill appearing Constitutional: Pt is oriented to person, place, and time. Appears well-developed and well-nourished.  Head: Normocephalic and atraumatic.  Right Ear: External ear normal.  Left Ear: External ear normal.  Nose: Nose normal.  Mouth/Throat: Oropharynx is clear and moist.  but mild erythema Eyes: Conjunctivae and EOM are normal. Pupils are equal, round, and reactive to light.  Neck: Normal range of motion. Neck supple. No JVD present. No tracheal deviation present.  Cardiovascular: Normal rate, regular rhythm, normal heart sounds and intact distal pulses.   Pulmonary/Chest: Effort normal and breath sounds decreased with bilat few wheezes.  Abdominal: Soft. Bowel sounds are normal. There is no tenderness. No HSM  Musculoskeletal: Normal range of motion. Exhibits no edema.  Lymphadenopathy:  Has no cervical  adenopathy.  Neurological: Pt is alert and oriented to person, place, and time. Pt has normal reflexes. No cranial nerve deficit.  Skin: Skin is warm and dry. No rash noted.  Psychiatric:  Has  normal mood and affect. Behavior is normal.      Assessment & Plan:

## 2012-08-16 NOTE — Assessment & Plan Note (Signed)
For zofran prn,  to f/u any worsening symptoms or concerns 

## 2012-08-16 NOTE — Assessment & Plan Note (Signed)
Mild to mod, for antibx course,  to f/u any worsening symptoms or concerns, cant ro pna - for cxr 

## 2012-11-11 ENCOUNTER — Emergency Department (HOSPITAL_BASED_OUTPATIENT_CLINIC_OR_DEPARTMENT_OTHER)
Admission: EM | Admit: 2012-11-11 | Discharge: 2012-11-11 | Disposition: A | Discharge status: Home / Self Care | Payer: 59 | Attending: Emergency Medicine | Admitting: Emergency Medicine

## 2012-11-11 ENCOUNTER — Encounter (HOSPITAL_BASED_OUTPATIENT_CLINIC_OR_DEPARTMENT_OTHER): Payer: Self-pay | Admitting: *Deleted

## 2012-11-11 DIAGNOSIS — F17200 Nicotine dependence, unspecified, uncomplicated: Secondary | ICD-10-CM | POA: Insufficient documentation

## 2012-11-11 DIAGNOSIS — Z3202 Encounter for pregnancy test, result negative: Secondary | ICD-10-CM | POA: Insufficient documentation

## 2012-11-11 DIAGNOSIS — N946 Dysmenorrhea, unspecified: Secondary | ICD-10-CM | POA: Insufficient documentation

## 2012-11-11 DIAGNOSIS — IMO0002 Reserved for concepts with insufficient information to code with codable children: Secondary | ICD-10-CM | POA: Insufficient documentation

## 2012-11-11 DIAGNOSIS — R112 Nausea with vomiting, unspecified: Secondary | ICD-10-CM | POA: Insufficient documentation

## 2012-11-11 DIAGNOSIS — Z79899 Other long term (current) drug therapy: Secondary | ICD-10-CM | POA: Insufficient documentation

## 2012-11-11 DIAGNOSIS — Z792 Long term (current) use of antibiotics: Secondary | ICD-10-CM | POA: Insufficient documentation

## 2012-11-11 DIAGNOSIS — Z87440 Personal history of urinary (tract) infections: Secondary | ICD-10-CM | POA: Insufficient documentation

## 2012-11-11 LAB — URINALYSIS, ROUTINE W REFLEX MICROSCOPIC
Bilirubin Urine: NEGATIVE
Glucose, UA: NEGATIVE mg/dL
Ketones, ur: NEGATIVE mg/dL
Leukocytes, UA: NEGATIVE
Nitrite: NEGATIVE
Protein, ur: 100 mg/dL — AB
Specific Gravity, Urine: 1.024 (ref 1.005–1.030)
Urobilinogen, UA: 0.2 mg/dL (ref 0.0–1.0)
pH: 8.5 — ABNORMAL HIGH (ref 5.0–8.0)

## 2012-11-11 LAB — PREGNANCY, URINE: Preg Test, Ur: NEGATIVE

## 2012-11-11 LAB — URINE MICROSCOPIC-ADD ON

## 2012-11-11 MED ORDER — IBUPROFEN 800 MG PO TABS
800.0000 mg | ORAL_TABLET | Freq: Once | ORAL | Status: AC
  Administered 2012-11-11: 800 mg via ORAL
  Filled 2012-11-11: qty 1

## 2012-11-11 MED ORDER — ONDANSETRON 4 MG PO TBDP
4.0000 mg | ORAL_TABLET | Freq: Once | ORAL | Status: AC
  Administered 2012-11-11: 4 mg via ORAL
  Filled 2012-11-11: qty 1

## 2012-11-11 MED ORDER — IBUPROFEN 800 MG PO TABS: 800.0000 mg | ORAL_TABLET | Freq: Three times a day (TID) | ORAL | Status: DC | PRN

## 2012-11-11 MED ORDER — ONDANSETRON HCL 4 MG PO TABS: 4.0000 mg | ORAL_TABLET | Freq: Three times a day (TID) | ORAL | Status: DC | PRN

## 2012-11-11 NOTE — ED Notes (Signed)
Pt states she has always had problems with abd cramping, but sometimes it is worse than others. Usually takes Ibuprofen, but did not this time.

## 2012-11-11 NOTE — ED Notes (Signed)
Pt reports onset of lower abd cramping that began today, pt also started her menses today - states she normally takes ibuprofen for abd cramping however did not today b/c she was afraid she was going to vomit. Pt in no acute distress at present, abd non-tender, non-distended.

## 2012-11-11 NOTE — ED Provider Notes (Signed)
History    This chart was scribed for Akacia Boltz B. Bernette Mayers, MD by Quintella Reichert, ED scribe.  This patient was seen in room MH10/MH10 and the patient's care was started at 9:35 PM.   CSN: [id number]  Arrival date & time 11/11/12  2019      Chief Complaint  Patient presents with  . Abdominal Cramping     The history is provided by the patient. No language interpreter was used.    HPI Comments: Andrea Daniels is a 22 y.o. female who presents to the Emergency Department complaining of constant, moderate abdominal cramping that began 3-4 hours ago, with accompanying nausea and emesis.  Pt notes that her period began today.  She reports h/o episodes of unusually severe menstrual cramping every 5-6 months for the past 3-4 years.  Pt states she usually takes ibuprofen with some relief but did not today because she was afraid she would throw it up.  She denies fever, chills, diarrhea, urinary symptoms, weakness, numbness or any other associated symptoms.  Pt does not have an OB/GYN.  She is not on birth control or hormone medication.  Past Medical History  Diagnosis Date  . UTI (lower urinary tract infection)     History reviewed. No pertinent past surgical history.  Family History  Problem Relation Age of Onset  . Hyperlipidemia Other   . Hypertension Other   . Arthritis Other   . Cancer Other     breast cancer  . Hypertension Other   . Diabetes Other     History  Substance Use Topics  . Smoking status: Current Every Day Smoker  . Smokeless tobacco: Not on file  . Alcohol Use: Yes    OB History   Grav Para Term Preterm Abortions TAB SAB Ect Mult Living                  Review of Systems A complete 10 system review of systems was obtained and all systems are negative except as noted in the HPI and PMH.    Allergies  Review of patient's allergies indicates no known allergies.  Home Medications   Current Outpatient Rx  Name  Route  Sig  Dispense  Refill  .  albuterol (PROVENTIL HFA;VENTOLIN HFA) 108 (90 BASE) MCG/ACT inhaler   Inhalation   Inhale 2 puffs into the lungs every 6 (six) hours as needed for wheezing.   1 Inhaler   2   . azithromycin (ZITHROMAX Z-PAK) 250 MG tablet      Use as dirrected   6 each   1   . HYDROcodone-homatropine (HYCODAN) 5-1.5 MG/5ML syrup   Oral   Take 5 mLs by mouth every 6 (six) hours as needed for cough.   120 mL   1   . ondansetron (ZOFRAN) 4 MG tablet      1-2 tabs by mouth every 8 hrs as needed for nausea   30 tablet   0   . predniSONE (DELTASONE) 10 MG tablet      3 tabs by mouth per day for 3 days,2tabs per day for 3 days,1tab per day for 3 days   18 tablet   0     BP 151/99  Pulse 71  Temp(Src) 97.5 F (36.4 C) (Oral)  Resp 16  Ht 4\' 11"  (1.499 m)  Wt 129 lb (58.514 kg)  BMI 26.04 kg/m2  SpO2 100%  LMP 11/11/2012  Physical Exam  Nursing note and vitals reviewed. Constitutional: She is oriented  to person, place, and time. She appears well-developed and well-nourished.  HENT:  Head: Normocephalic and atraumatic.  Eyes: EOM are normal. Pupils are equal, round, and reactive to light.  Neck: Normal range of motion. Neck supple.  Cardiovascular: Normal rate, normal heart sounds and intact distal pulses.   Pulmonary/Chest: Effort normal and breath sounds normal.  Abdominal: Bowel sounds are normal. She exhibits no distension. There is no tenderness.  Musculoskeletal: Normal range of motion. She exhibits no edema and no tenderness.  Neurological: She is alert and oriented to person, place, and time. She has normal strength. No cranial nerve deficit or sensory deficit.  Skin: Skin is warm and dry. No rash noted.  Psychiatric: She has a normal mood and affect.    ED Course  Procedures (including critical care time)  DIAGNOSTIC STUDIES: Oxygen Saturation is 100% on room air, normal by my interpretation.    COORDINATION OF CARE: 9:38 PM-Informed pt that her labs came back normal.   Discussed treatment plan which includes IV fluids, pain medication and anti-emetics with pt at bedside and pt agreed to plan.      Labs Reviewed  URINALYSIS, ROUTINE W REFLEX MICROSCOPIC - Abnormal; Notable for the following:    APPearance CLOUDY (*)    pH 8.5 (*)    Hgb urine dipstick LARGE (*)    Protein, ur 100 (*)    All other components within normal limits  URINE MICROSCOPIC-ADD ON - Abnormal; Notable for the following:    Squamous Epithelial / LPF FEW (*)    Bacteria, UA MANY (*)    All other components within normal limits  PREGNANCY, URINE   No results found.   No diagnosis found.    MDM  Pt with dysmenorrhea ongoing for months during her menses reports cramping for the last 3 hours. Preg negative. UA contaminated, but no infection. Abd benign. Advised follow up with Gyn for management of her symptoms. NSAIDs, antiemetics if needed at home. No indication for further ED workup.       I personally performed the services described in this documentation, which was scribed in my presence. The recorded information has been reviewed and is accurate.     Kris No B. Bernette Mayers, MD 11/11/12 2148

## 2012-12-12 ENCOUNTER — Ambulatory Visit: Payer: 59 | Admitting: Internal Medicine

## 2012-12-12 DIAGNOSIS — Z0289 Encounter for other administrative examinations: Secondary | ICD-10-CM

## 2013-01-28 ENCOUNTER — Encounter (HOSPITAL_COMMUNITY): Payer: Self-pay

## 2013-01-28 ENCOUNTER — Emergency Department (HOSPITAL_COMMUNITY)
Admission: EM | Admit: 2013-01-28 | Discharge: 2013-01-28 | Disposition: A | Discharge status: Home / Self Care | Payer: 59 | Attending: Emergency Medicine | Admitting: Emergency Medicine

## 2013-01-28 DIAGNOSIS — F17200 Nicotine dependence, unspecified, uncomplicated: Secondary | ICD-10-CM | POA: Insufficient documentation

## 2013-01-28 DIAGNOSIS — Z3202 Encounter for pregnancy test, result negative: Secondary | ICD-10-CM | POA: Insufficient documentation

## 2013-01-28 DIAGNOSIS — R112 Nausea with vomiting, unspecified: Secondary | ICD-10-CM

## 2013-01-28 DIAGNOSIS — R197 Diarrhea, unspecified: Secondary | ICD-10-CM | POA: Insufficient documentation

## 2013-01-28 DIAGNOSIS — Z87440 Personal history of urinary (tract) infections: Secondary | ICD-10-CM | POA: Insufficient documentation

## 2013-01-28 DIAGNOSIS — Z79899 Other long term (current) drug therapy: Secondary | ICD-10-CM | POA: Insufficient documentation

## 2013-01-28 DIAGNOSIS — R109 Unspecified abdominal pain: Secondary | ICD-10-CM

## 2013-01-28 LAB — CBC WITH DIFFERENTIAL/PLATELET
Band Neutrophils: 0 % (ref 0–10)
Basophils Absolute: 0.1 10*3/uL (ref 0.0–0.1)
Basophils Relative: 3 % — ABNORMAL HIGH (ref 0–1)
Blasts: 0 %
Eosinophils Absolute: 0.2 10*3/uL (ref 0.0–0.7)
Eosinophils Relative: 5 % (ref 0–5)
HCT: 39.1 % (ref 36.0–46.0)
Hemoglobin: 13.7 g/dL (ref 12.0–15.0)
Lymphocytes Relative: 52 % — ABNORMAL HIGH (ref 12–46)
Lymphs Abs: 2.1 10*3/uL (ref 0.7–4.0)
MCH: 28.5 pg (ref 26.0–34.0)
MCHC: 35 g/dL (ref 30.0–36.0)
MCV: 81.3 fL (ref 78.0–100.0)
Metamyelocytes Relative: 0 %
Monocytes Absolute: 0.2 10*3/uL (ref 0.1–1.0)
Monocytes Relative: 4 % (ref 3–12)
Myelocytes: 0 %
Neutro Abs: 1.4 10*3/uL — ABNORMAL LOW (ref 1.7–7.7)
Neutrophils Relative %: 36 % — ABNORMAL LOW (ref 43–77)
Platelets: 361 10*3/uL (ref 150–400)
Promyelocytes Absolute: 0 %
RBC: 4.81 MIL/uL (ref 3.87–5.11)
RDW: 13 % (ref 11.5–15.5)
WBC: 4 10*3/uL (ref 4.0–10.5)
nRBC: 0 /100 WBC

## 2013-01-28 LAB — COMPREHENSIVE METABOLIC PANEL
ALT: 18 U/L (ref 0–35)
AST: 24 U/L (ref 0–37)
Albumin: 3.8 g/dL (ref 3.5–5.2)
Alkaline Phosphatase: 73 U/L (ref 39–117)
BUN: 8 mg/dL (ref 6–23)
CO2: 25 mEq/L (ref 19–32)
Calcium: 8.9 mg/dL (ref 8.4–10.5)
Chloride: 104 mEq/L (ref 96–112)
Creatinine, Ser: 0.68 mg/dL (ref 0.50–1.10)
GFR calc Af Amer: >90 mL/min (ref >90)
GFR calc non Af Amer: >90 mL/min (ref >90)
Glucose, Bld: 101 mg/dL — ABNORMAL HIGH (ref 70–99)
Potassium: 3.9 mEq/L (ref 3.5–5.1)
Sodium: 140 mEq/L (ref 135–145)
Total Bilirubin: 0.8 mg/dL (ref 0.3–1.2)
Total Protein: 7.8 g/dL (ref 6.0–8.3)

## 2013-01-28 LAB — URINALYSIS, ROUTINE W REFLEX MICROSCOPIC
Bilirubin Urine: NEGATIVE
Glucose, UA: NEGATIVE mg/dL
Hgb urine dipstick: NEGATIVE
Ketones, ur: NEGATIVE mg/dL
Nitrite: NEGATIVE
Protein, ur: 30 mg/dL — AB
Specific Gravity, Urine: 1.021 (ref 1.005–1.030)
Urobilinogen, UA: 0.2 mg/dL (ref 0.0–1.0)
pH: 8.5 — ABNORMAL HIGH (ref 5.0–8.0)

## 2013-01-28 LAB — URINE MICROSCOPIC-ADD ON

## 2013-01-28 LAB — POCT PREGNANCY, URINE: Preg Test, Ur: NEGATIVE

## 2013-01-28 LAB — LIPASE, BLOOD: Lipase: 38 U/L (ref 11–59)

## 2013-01-28 MED ORDER — HYDROCODONE-ACETAMINOPHEN 5-325 MG PO TABS: ORAL_TABLET | ORAL | Status: DC

## 2013-01-28 MED ORDER — MORPHINE SULFATE 4 MG/ML IJ SOLN
4.0000 mg | Freq: Once | INTRAMUSCULAR | Status: AC
  Administered 2013-01-28: 4 mg via INTRAVENOUS
  Filled 2013-01-28: qty 1

## 2013-01-28 MED ORDER — FAMOTIDINE IN NACL 20-0.9 MG/50ML-% IV SOLN
20.0000 mg | Freq: Once | INTRAVENOUS | Status: AC
  Administered 2013-01-28: 20 mg via INTRAVENOUS
  Filled 2013-01-28: qty 50

## 2013-01-28 MED ORDER — SODIUM CHLORIDE 0.9 % IV BOLUS (SEPSIS)
1000.0000 mL | Freq: Once | INTRAVENOUS | Status: AC
  Administered 2013-01-28: 1000 mL via INTRAVENOUS

## 2013-01-28 MED ORDER — PROMETHAZINE HCL 25 MG PO TABS: 25.0000 mg | ORAL_TABLET | Freq: Four times a day (QID) | ORAL | Status: DC | PRN

## 2013-01-28 MED ORDER — GI COCKTAIL ~~LOC~~
30.0000 mL | Freq: Once | ORAL | Status: AC
  Administered 2013-01-28: 30 mL via ORAL
  Filled 2013-01-28: qty 30

## 2013-01-28 MED ORDER — ONDANSETRON 4 MG PO TBDP
8.0000 mg | ORAL_TABLET | Freq: Once | ORAL | Status: AC
  Administered 2013-01-28: 8 mg via ORAL
  Filled 2013-01-28: qty 2

## 2013-01-28 MED ORDER — DICYCLOMINE HCL 20 MG PO TABS: 20.0000 mg | ORAL_TABLET | Freq: Two times a day (BID) | ORAL | Status: DC

## 2013-01-28 MED ORDER — ONDANSETRON HCL 4 MG/2ML IJ SOLN
4.0000 mg | Freq: Once | INTRAMUSCULAR | Status: AC
  Administered 2013-01-28: 4 mg via INTRAVENOUS
  Filled 2013-01-28: qty 2

## 2013-01-28 NOTE — ED Notes (Addendum)
Abdominal pain with n/v /d began this am. Pt. Is alert and oriented X4, Skin is warm and dry. Pt. Vomiting in triage

## 2013-01-28 NOTE — ED Provider Notes (Signed)
CSN: [id number]     Arrival date & time 01/28/13  1417 History     First MD Initiated Contact with Patient 01/28/13 1653     Chief Complaint  Patient presents with  . Abdominal Pain   (Consider location/radiation/quality/duration/timing/severity/associated sxs/prior Treatment) HPI  Andrea Daniels is a 22 y.o. female complaining of acute onset of nausea vomiting diarrhea and epigastric abdominal pain this morning at approximately 9 AM. Patient has had 6 episodes of nonbloody, nonbilious, no coffee ground emesis and 2 episodes of loose stool. Patient denies fever, sick contacts, recent travel, camping. Rates her pain at 5/6, described as aching and no exacerbating or alleviating factors identified. Patient has never had abdominal surgery.   Past Medical History  Diagnosis Date  . UTI (lower urinary tract infection)    History reviewed. No pertinent past surgical history. Family History  Problem Relation Age of Onset  . Hyperlipidemia Other   . Hypertension Other   . Arthritis Other   . Cancer Other     breast cancer  . Hypertension Other   . Diabetes Other    History  Substance Use Topics  . Smoking status: Current Every Day Smoker  . Smokeless tobacco: Not on file  . Alcohol Use: Yes   OB History   Grav Para Term Preterm Abortions TAB SAB Ect Mult Living                 Review of Systems 10 systems reviewed and found to be negative, except as noted in the HPI  Allergies  Review of patient's allergies indicates no known allergies.  Home Medications   Current Outpatient Rx  Name  Route  Sig  Dispense  Refill  . albuterol (PROVENTIL HFA;VENTOLIN HFA) 108 (90 BASE) MCG/ACT inhaler   Inhalation   Inhale 2 puffs into the lungs every 6 (six) hours as needed for wheezing.   1 Inhaler   2    BP 143/105  Pulse 75  Temp(Src) 98.2 F (36.8 C) (Oral)  Resp 20  SpO2 98% Physical Exam  Nursing note and vitals reviewed. Constitutional: She is oriented to person, place,  and time. She appears well-developed and well-nourished. No distress.  HENT:  Head: Normocephalic.  Eyes: Conjunctivae and EOM are normal.  Cardiovascular: Normal rate.   Pulmonary/Chest: Effort normal and breath sounds normal. No stridor. No respiratory distress. She has no wheezes. She has no rales. She exhibits no tenderness.  Abdominal: Soft. Bowel sounds are normal. She exhibits no distension and no mass. There is tenderness. There is no rebound and no guarding.  Patient has tender in the epigastrium, right upper and right lower quadrant. Rovsing, obturator and psoas are negative. No guarding or rebound.  Musculoskeletal: Normal range of motion.  Neurological: She is alert and oriented to person, place, and time.  Psychiatric: She has a normal mood and affect.    ED Course   Procedures (including critical care time)  Labs Reviewed  CBC WITH DIFFERENTIAL - Abnormal; Notable for the following:    Neutrophils Relative % 36 (*)    Lymphocytes Relative 52 (*)    Basophils Relative 3 (*)    Neutro Abs 1.4 (*)    All other components within normal limits  COMPREHENSIVE METABOLIC PANEL - Abnormal; Notable for the following:    Glucose, Bld 101 (*)    All other components within normal limits  URINALYSIS, ROUTINE W REFLEX MICROSCOPIC - Abnormal; Notable for the following:    pH 8.5 (*)  Protein, ur 30 (*)    Leukocytes, UA TRACE (*)    All other components within normal limits  URINE MICROSCOPIC-ADD ON - Abnormal; Notable for the following:    Squamous Epithelial / LPF FEW (*)    Bacteria, UA MANY (*)    All other components within normal limits  LIPASE, BLOOD  POCT PREGNANCY, URINE   No results found. 1. Nausea vomiting and diarrhea   2. Abdominal pain     MDM   Filed Vitals:   01/28/13 1450 01/28/13 1843 01/28/13 1900 01/28/13 1930  BP: 143/105 145/95 133/88 126/93  Pulse: 75 63 79 73  Temp: 98.2 F (36.8 C)     TempSrc: Oral     Resp: 20 16    SpO2: 98% 98% 97%  100%     Andrea Daniels is a 22 y.o. female with nausea vomiting diarrhea and abdominal pain. Patient cannot say exactly which came first. Serial abdominal exams remained nonsurgical. Patient is tolerating by mouth and is amenable to discharge. We have discussed return precautions including but not limited to pain localization to the right lower quadrant.  Medications  ondansetron (ZOFRAN-ODT) disintegrating tablet 8 mg (8 mg Oral Given 01/28/13 1457)  sodium chloride 0.9 % bolus 1,000 mL (0 mLs Intravenous Stopped 01/28/13 1959)  famotidine (PEPCID) IVPB 20 mg (0 mg Intravenous Stopped 01/28/13 1959)  ondansetron (ZOFRAN) injection 4 mg (4 mg Intravenous Given 01/28/13 1845)  morphine 4 MG/ML injection 4 mg (4 mg Intravenous Given 01/28/13 1845)  gi cocktail (Maalox,Lidocaine,Donnatal) (30 mLs Oral Given 01/28/13 1845)    Pt is hemodynamically stable, appropriate for, and amenable to discharge at this time. Pt verbalized understanding and agrees with care plan. All questions answered. Outpatient follow-up and specific return precautions discussed.    Discharge Medication List as of 01/28/2013  6:59 PM    START taking these medications   Details  dicyclomine (BENTYL) 20 MG tablet Take 1 tablet (20 mg total) by mouth 2 (two) times daily., Starting 01/28/2013, Until Discontinued, Print    HYDROcodone-acetaminophen (NORCO/VICODIN) 5-325 MG per tablet Take 1-2 tablets by mouth every 6 hours as needed for pain., Print    promethazine (PHENERGAN) 25 MG tablet Take 1 tablet (25 mg total) by mouth every 6 (six) hours as needed for nausea., Starting 01/28/2013, Until Discontinued, Print        Note: Portions of this report may have been transcribed using voice recognition software. Every effort was made to ensure accuracy; however, inadvertent computerized transcription errors may be present    Wynetta Emery, PA-C 01/28/13 2100

## 2013-01-29 NOTE — ED Provider Notes (Signed)
Medical screening examination/treatment/procedure(s) were performed by non-physician practitioner and as supervising physician I was immediately available for consultation/collaboration.   Richardean Canal, MD 01/29/13 [phone]

## 2013-01-30 LAB — URINE CULTURE: Colony Count: >=100000

## 2013-01-31 ENCOUNTER — Telehealth (HOSPITAL_COMMUNITY): Payer: Self-pay | Admitting: Emergency Medicine

## 2013-01-31 NOTE — ED Notes (Signed)
Post ED Visit - Positive Culture Follow-up  Culture report reviewed by antimicrobial stewardship pharmacist: [x]  Wes Dulaney, Pharm.D., BCPS []  Celedonio Miyamoto, Pharm.D., BCPS []  Georgina Pillion, [address].D., BCPS []  [place], [address].D., BCPS, AAHIVP []  Estella Husk, Pharm.D., BCPS, AAHIVP  Positive urine culture Per Heather VanWingen PA-C, no treatment indicated and no further patient follow-up is required at this time.  Kylie A Holland 01/31/2013, 2:42 PM

## 2013-01-31 NOTE — Progress Notes (Signed)
ED Antimicrobial Stewardship Positive Culture Follow Up   Andrea Daniels is an 22 y.o. female who presented to [facility] on 01/28/2013 with a chief complaint of  Chief Complaint  Patient presents with  . Abdominal Pain    Recent Results (from the past 720 hour(s))  URINE CULTURE     Status: None   Collection Time    01/28/13  6:14 PM      Result Value Range Status   Specimen Description URINE, CLEAN CATCH   Final   Special Requests NONE   Final   Culture  Setup Time     Final   Value: 01/28/2013 20:43     Performed at [facility] Count     Final   Value: >=100,000 COLONIES/ML     Performed at [facility]   Culture     Final   Value: ESCHERICHIA COLI     Performed at [facility]   Report Status 01/30/2013 FINAL   Final   Organism ID, Bacteria ESCHERICHIA COLI   Final    No antibiotic received at discharge. Asymptomatic bacteruria - no treatment indicated at this time.   ED Provider: Doran Durand, PA-C   Andrea Daniels 01/31/2013, 10:25 AM Infectious Diseases Pharmacist Phone# [phone]

## 2013-02-26 ENCOUNTER — Emergency Department (HOSPITAL_COMMUNITY)
Admission: EM | Admit: 2013-02-26 | Discharge: 2013-02-26 | Disposition: A | Discharge status: Home / Self Care | Payer: 59 | Attending: Emergency Medicine | Admitting: Emergency Medicine

## 2013-02-26 ENCOUNTER — Encounter (HOSPITAL_COMMUNITY): Payer: Self-pay | Admitting: *Deleted

## 2013-02-26 DIAGNOSIS — Z79899 Other long term (current) drug therapy: Secondary | ICD-10-CM | POA: Insufficient documentation

## 2013-02-26 DIAGNOSIS — R197 Diarrhea, unspecified: Secondary | ICD-10-CM | POA: Insufficient documentation

## 2013-02-26 DIAGNOSIS — R000 Tachycardia, unspecified: Secondary | ICD-10-CM | POA: Insufficient documentation

## 2013-02-26 DIAGNOSIS — R112 Nausea with vomiting, unspecified: Secondary | ICD-10-CM | POA: Insufficient documentation

## 2013-02-26 DIAGNOSIS — Z3202 Encounter for pregnancy test, result negative: Secondary | ICD-10-CM | POA: Insufficient documentation

## 2013-02-26 DIAGNOSIS — F17200 Nicotine dependence, unspecified, uncomplicated: Secondary | ICD-10-CM | POA: Insufficient documentation

## 2013-02-26 DIAGNOSIS — Z87440 Personal history of urinary (tract) infections: Secondary | ICD-10-CM | POA: Insufficient documentation

## 2013-02-26 DIAGNOSIS — R1012 Left upper quadrant pain: Secondary | ICD-10-CM | POA: Insufficient documentation

## 2013-02-26 LAB — URINALYSIS, ROUTINE W REFLEX MICROSCOPIC
Bilirubin Urine: NEGATIVE
Glucose, UA: NEGATIVE mg/dL
Hgb urine dipstick: NEGATIVE
Ketones, ur: NEGATIVE mg/dL
Leukocytes, UA: NEGATIVE
Nitrite: NEGATIVE
Protein, ur: NEGATIVE mg/dL
Specific Gravity, Urine: 1.021 (ref 1.005–1.030)
Urobilinogen, UA: 0.2 mg/dL (ref 0.0–1.0)
pH: 6 (ref 5.0–8.0)

## 2013-02-26 LAB — COMPREHENSIVE METABOLIC PANEL
ALT: 26 U/L (ref 0–35)
AST: 30 U/L (ref 0–37)
Albumin: 4.1 g/dL (ref 3.5–5.2)
Alkaline Phosphatase: 62 U/L (ref 39–117)
BUN: 7 mg/dL (ref 6–23)
CO2: 23 mEq/L (ref 19–32)
Calcium: 9 mg/dL (ref 8.4–10.5)
Chloride: 101 mEq/L (ref 96–112)
Creatinine, Ser: 0.73 mg/dL (ref 0.50–1.10)
GFR calc Af Amer: >90 mL/min (ref >90)
GFR calc non Af Amer: >90 mL/min (ref >90)
Glucose, Bld: 102 mg/dL — ABNORMAL HIGH (ref 70–99)
Potassium: 3.2 mEq/L — ABNORMAL LOW (ref 3.5–5.1)
Sodium: 136 mEq/L (ref 135–145)
Total Bilirubin: 0.4 mg/dL (ref 0.3–1.2)
Total Protein: 8.1 g/dL (ref 6.0–8.3)

## 2013-02-26 LAB — POCT PREGNANCY, URINE: Preg Test, Ur: NEGATIVE

## 2013-02-26 LAB — CBC WITH DIFFERENTIAL/PLATELET
Basophils Absolute: 0.1 10*3/uL (ref 0.0–0.1)
Basophils Relative: 1 % (ref 0–1)
Eosinophils Absolute: 0.3 10*3/uL (ref 0.0–0.7)
Eosinophils Relative: 4 % (ref 0–5)
HCT: 36.4 % (ref 36.0–46.0)
Hemoglobin: 13.2 g/dL (ref 12.0–15.0)
Lymphocytes Relative: 20 % (ref 12–46)
Lymphs Abs: 1.3 10*3/uL (ref 0.7–4.0)
MCH: 28.4 pg (ref 26.0–34.0)
MCHC: 36.3 g/dL — ABNORMAL HIGH (ref 30.0–36.0)
MCV: 78.3 fL (ref 78.0–100.0)
Monocytes Absolute: 0.3 10*3/uL (ref 0.1–1.0)
Monocytes Relative: 4 % (ref 3–12)
Neutro Abs: 4.8 10*3/uL (ref 1.7–7.7)
Neutrophils Relative %: 70 % (ref 43–77)
Platelets: 323 10*3/uL (ref 150–400)
RBC: 4.65 MIL/uL (ref 3.87–5.11)
RDW: 12.7 % (ref 11.5–15.5)
WBC: 6.8 10*3/uL (ref 4.0–10.5)

## 2013-02-26 MED ORDER — SODIUM CHLORIDE 0.9 % IV SOLN
1000.0000 mL | Freq: Once | INTRAVENOUS | Status: AC
  Administered 2013-02-26: 1000 mL via INTRAVENOUS

## 2013-02-26 MED ORDER — LOPERAMIDE HCL 2 MG PO CAPS
4.0000 mg | ORAL_CAPSULE | Freq: Once | ORAL | Status: AC
  Administered 2013-02-26: 4 mg via ORAL
  Filled 2013-02-26: qty 2

## 2013-02-26 MED ORDER — ONDANSETRON HCL 4 MG PO TABS: 4.0000 mg | ORAL_TABLET | Freq: Four times a day (QID) | ORAL | Status: DC | PRN

## 2013-02-26 MED ORDER — SODIUM CHLORIDE 0.9 % IV SOLN
1000.0000 mL | INTRAVENOUS | Status: DC
  Administered 2013-02-26: 1000 mL via INTRAVENOUS

## 2013-02-26 MED ORDER — ONDANSETRON HCL 4 MG/2ML IJ SOLN
4.0000 mg | Freq: Once | INTRAMUSCULAR | Status: AC
  Administered 2013-02-26: 4 mg via INTRAVENOUS
  Filled 2013-02-26: qty 2

## 2013-02-26 NOTE — ED Notes (Signed)
Pt states she woke up 2 hours ago with diffuse abdominal pain, diarrhea, and vomiting and feels like heart is racing.

## 2013-02-26 NOTE — ED Notes (Signed)
MD at bedside. 

## 2013-02-26 NOTE — ED Notes (Signed)
Patient presents with c/o an acute onset of abdominal pain, n/v and diarrhea. Started about 2 hours PTA "all of a sudden." reports having 5 episodes of diarrhea and 4 episodes on vomiting PTA. Denies any blood in stool/urine. No fevers, sweats or chills. Reports being in normal state of health prior to onset of sx tonight.

## 2013-02-26 NOTE — ED Provider Notes (Signed)
CSN: [id number]     Arrival date & time 02/26/13  0533 History   First MD Initiated Contact with Patient 02/26/13 0541     Chief Complaint  Patient presents with  . Abdominal Pain  . Emesis  . Diarrhea  . Racing heart rate    (Consider location/radiation/quality/duration/timing/severity/associated sxs/prior Treatment) Patient is a 22 y.o. female presenting with abdominal pain, vomiting, and diarrhea. The history is provided by the patient.  Abdominal Pain Associated symptoms: diarrhea and vomiting   Emesis Associated symptoms: abdominal pain and diarrhea   Diarrhea Associated symptoms: abdominal pain and vomiting   She woke up about 2 hours ago with crampy abdominal pain which is worse than the left upper quadrant, nausea, vomiting, diarrhea. Abdominal pain is not improved after vomiting or diarrhea. She rates her pain at 8/10. She denies fever, chills, sweats. There's no blood or mucus in stool or emesis. She has not had any sick contacts but she did heat some chicken that didn't taste right to have a family cookout. No one else got sick who was at the cookout. Her mother gave her a pill for nausea but it did not help.  Past Medical History  Diagnosis Date  . UTI (lower urinary tract infection)    History reviewed. No pertinent past surgical history. Family History  Problem Relation Age of Onset  . Hyperlipidemia Other   . Hypertension Other   . Arthritis Other   . Cancer Other     breast cancer  . Hypertension Other   . Diabetes Other    History  Substance Use Topics  . Smoking status: Current Every Day Smoker  . Smokeless tobacco: Not on file  . Alcohol Use: Yes   OB History   Grav Para Term Preterm Abortions TAB SAB Ect Mult Living                 Review of Systems  Gastrointestinal: Positive for vomiting, abdominal pain and diarrhea.  All other systems reviewed and are negative.    Allergies  Review of patient's allergies indicates no known allergies.  Home  Medications   Current Outpatient Rx  Name  Route  Sig  Dispense  Refill  . albuterol (PROVENTIL HFA;VENTOLIN HFA) 108 (90 BASE) MCG/ACT inhaler   Inhalation   Inhale 2 puffs into the lungs every 6 (six) hours as needed for wheezing.   1 Inhaler   2   . dicyclomine (BENTYL) 20 MG tablet   Oral   Take 1 tablet (20 mg total) by mouth 2 (two) times daily.   20 tablet   0   . HYDROcodone-acetaminophen (NORCO/VICODIN) 5-325 MG per tablet      Take 1-2 tablets by mouth every 6 hours as needed for pain.   15 tablet   0   . promethazine (PHENERGAN) 25 MG tablet   Oral   Take 1 tablet (25 mg total) by mouth every 6 (six) hours as needed for nausea.   12 tablet   0    BP 176/104  Pulse 81  Temp(Src) 98.8 F (37.1 C) (Oral)  SpO2 94% Physical Exam  Nursing note and vitals reviewed.  22 year old female, resting comfortably and in no acute distress. Vital signs are significant for hypertension with blood pressure 176/104. Oxygen saturation is 94%, which is normal. Head is normocephalic and atraumatic. PERRLA, EOMI. Oropharynx is clear. Neck is nontender and supple without adenopathy or JVD. Back is nontender and there is no CVA tenderness. Lungs  are clear without rales, wheezes, or rhonchi. Chest is nontender. Heart has regular rate and rhythm without murmur. Abdomen is soft, flat, with mild tenderness diffusely. There is no rebound or guarding. There are no masses or hepatosplenomegaly and peristalsis is hypoactive. Extremities have no cyanosis or edema, full range of motion is present. Skin is warm and dry without rash. Neurologic: Mental status is normal, cranial nerves are intact, there are no motor or sensory deficits.  ED Course  Procedures (including critical care time) Labs Review Results for orders placed during the hospital encounter of 02/26/13  CBC WITH DIFFERENTIAL      Result Value Range   WBC 6.8  4.0 - 10.5 K/uL   RBC 4.65  3.87 - 5.11 MIL/uL   Hemoglobin  13.2  12.0 - 15.0 g/dL   HCT 16.1  09.6 - 04.5 %   MCV 78.3  78.0 - 100.0 fL   MCH 28.4  26.0 - 34.0 pg   MCHC 36.3 (*) 30.0 - 36.0 g/dL   RDW 40.9  81.1 - 91.4 %   Platelets 323  150 - 400 K/uL   Neutrophils Relative % 70  43 - 77 %   Neutro Abs 4.8  1.7 - 7.7 K/uL   Lymphocytes Relative 20  12 - 46 %   Lymphs Abs 1.3  0.7 - 4.0 K/uL   Monocytes Relative 4  3 - 12 %   Monocytes Absolute 0.3  0.1 - 1.0 K/uL   Eosinophils Relative 4  0 - 5 %   Eosinophils Absolute 0.3  0.0 - 0.7 K/uL   Basophils Relative 1  0 - 1 %   Basophils Absolute 0.1  0.0 - 0.1 K/uL  COMPREHENSIVE METABOLIC PANEL      Result Value Range   Sodium 136  135 - 145 mEq/L   Potassium 3.2 (*) 3.5 - 5.1 mEq/L   Chloride 101  96 - 112 mEq/L   CO2 23  19 - 32 mEq/L   Glucose, Bld 102 (*) 70 - 99 mg/dL   BUN 7  6 - 23 mg/dL   Creatinine, Ser 7.82  0.50 - 1.10 mg/dL   Calcium 9.0  8.4 - 95.6 mg/dL   Total Protein 8.1  6.0 - 8.3 g/dL   Albumin 4.1  3.5 - 5.2 g/dL   AST 30  0 - 37 U/L   ALT 26  0 - 35 U/L   Alkaline Phosphatase 62  39 - 117 U/L   Total Bilirubin 0.4  0.3 - 1.2 mg/dL   GFR calc non Af Amer >90  >90 mL/min   GFR calc Af Amer >90  >90 mL/min  URINALYSIS, ROUTINE W REFLEX MICROSCOPIC      Result Value Range   Color, Urine YELLOW  YELLOW   APPearance CLEAR  CLEAR   Specific Gravity, Urine 1.021  1.005 - 1.030   pH 6.0  5.0 - 8.0   Glucose, UA NEGATIVE  NEGATIVE mg/dL   Hgb urine dipstick NEGATIVE  NEGATIVE   Bilirubin Urine NEGATIVE  NEGATIVE   Ketones, ur NEGATIVE  NEGATIVE mg/dL   Protein, ur NEGATIVE  NEGATIVE mg/dL   Urobilinogen, UA 0.2  0.0 - 1.0 mg/dL   Nitrite NEGATIVE  NEGATIVE   Leukocytes, UA NEGATIVE  NEGATIVE  POCT PREGNANCY, URINE      Result Value Range   Preg Test, Ur NEGATIVE  NEGATIVE    Date: 02/26/2013  Rate: 76  Rhythm: normal sinus rhythm and sinus arrhythmia  QRS Axis: normal  Intervals: normal  ST/T Wave abnormalities: normal  Conduction Disutrbances:none   Narrative Interpretation: Sinus rhythm with sinus arrhythmia. No prior ECG available for comparison.  Old EKG Reviewed: none available MDM   1. Nausea vomiting and diarrhea    Crampy abdominal pain, vomiting, diarrhea most consistent with viral gastroenteritis. Her exam is not worrisome for more serious pathology. Screening labs will be obtained and she'll be given IV fluids and IV ondansetron, and oral loperamide.  She feels much better after above noted treatment. Laboratory workup is unremarkable except for mild hypokalemia. She is discharged with prescription for ondansetron and is advised to use over-the-counter loperamide as needed for diarrhea.  [name], MD 02/26/13 0730

## 2013-04-19 ENCOUNTER — Emergency Department (HOSPITAL_COMMUNITY)
Admission: EM | Admit: 2013-04-19 | Discharge: 2013-04-19 | Disposition: A | Discharge status: Home / Self Care | Payer: 59 | Attending: Emergency Medicine | Admitting: Emergency Medicine

## 2013-04-19 ENCOUNTER — Encounter (HOSPITAL_COMMUNITY): Payer: Self-pay | Admitting: Emergency Medicine

## 2013-04-19 DIAGNOSIS — R1013 Epigastric pain: Secondary | ICD-10-CM | POA: Insufficient documentation

## 2013-04-19 DIAGNOSIS — R109 Unspecified abdominal pain: Secondary | ICD-10-CM

## 2013-04-19 DIAGNOSIS — Z3202 Encounter for pregnancy test, result negative: Secondary | ICD-10-CM | POA: Insufficient documentation

## 2013-04-19 DIAGNOSIS — Z87440 Personal history of urinary (tract) infections: Secondary | ICD-10-CM | POA: Insufficient documentation

## 2013-04-19 DIAGNOSIS — F17200 Nicotine dependence, unspecified, uncomplicated: Secondary | ICD-10-CM | POA: Insufficient documentation

## 2013-04-19 LAB — CBC WITH DIFFERENTIAL/PLATELET
Basophils Absolute: 0.1 10*3/uL (ref 0.0–0.1)
Basophils Relative: 1 % (ref 0–1)
Eosinophils Absolute: 0.5 10*3/uL (ref 0.0–0.7)
Eosinophils Relative: 7 % — ABNORMAL HIGH (ref 0–5)
HCT: 39.8 % (ref 36.0–46.0)
Hemoglobin: 14 g/dL (ref 12.0–15.0)
Lymphocytes Relative: 29 % (ref 12–46)
Lymphs Abs: 2.2 10*3/uL (ref 0.7–4.0)
MCH: 28.2 pg (ref 26.0–34.0)
MCHC: 35.2 g/dL (ref 30.0–36.0)
MCV: 80.1 fL (ref 78.0–100.0)
Monocytes Absolute: 0.5 10*3/uL (ref 0.1–1.0)
Monocytes Relative: 6 % (ref 3–12)
Neutro Abs: 4.1 10*3/uL (ref 1.7–7.7)
Neutrophils Relative %: 56 % (ref 43–77)
Platelets: 301 10*3/uL (ref 150–400)
RBC: 4.97 MIL/uL (ref 3.87–5.11)
RDW: 13.2 % (ref 11.5–15.5)
WBC: 7.4 10*3/uL (ref 4.0–10.5)

## 2013-04-19 LAB — BASIC METABOLIC PANEL
BUN: 13 mg/dL (ref 6–23)
CO2: 21 mEq/L (ref 19–32)
Calcium: 9.4 mg/dL (ref 8.4–10.5)
Chloride: 103 mEq/L (ref 96–112)
Creatinine, Ser: 0.75 mg/dL (ref 0.50–1.10)
GFR calc Af Amer: >90 mL/min (ref >90)
GFR calc non Af Amer: >90 mL/min (ref >90)
Glucose, Bld: 86 mg/dL (ref 70–99)
Potassium: 3.9 mEq/L (ref 3.5–5.1)
Sodium: 139 mEq/L (ref 135–145)

## 2013-04-19 LAB — HEPATIC FUNCTION PANEL
ALT: 29 U/L (ref 0–35)
AST: 28 U/L (ref 0–37)
Albumin: 4.1 g/dL (ref 3.5–5.2)
Alkaline Phosphatase: 64 U/L (ref 39–117)
Bilirubin, Direct: <0.1 mg/dL (ref 0.0–0.3)
Total Bilirubin: 0.3 mg/dL (ref 0.3–1.2)
Total Protein: 8.5 g/dL — ABNORMAL HIGH (ref 6.0–8.3)

## 2013-04-19 LAB — URINE MICROSCOPIC-ADD ON

## 2013-04-19 LAB — URINALYSIS, ROUTINE W REFLEX MICROSCOPIC
Bilirubin Urine: NEGATIVE
Glucose, UA: NEGATIVE mg/dL
Ketones, ur: 15 mg/dL — AB
Leukocytes, UA: NEGATIVE
Nitrite: NEGATIVE
Protein, ur: NEGATIVE mg/dL
Specific Gravity, Urine: >1.03 — ABNORMAL HIGH (ref 1.005–1.030)
Urobilinogen, UA: 0.2 mg/dL (ref 0.0–1.0)
pH: 6 (ref 5.0–8.0)

## 2013-04-19 LAB — LIPASE, BLOOD: Lipase: 40 U/L (ref 11–59)

## 2013-04-19 LAB — POCT PREGNANCY, URINE: Preg Test, Ur: NEGATIVE

## 2013-04-19 MED ORDER — DICYCLOMINE HCL 10 MG PO CAPS
10.0000 mg | ORAL_CAPSULE | Freq: Once | ORAL | Status: AC
  Administered 2013-04-19: 10 mg via ORAL
  Filled 2013-04-19: qty 1

## 2013-04-19 MED ORDER — ACETAMINOPHEN 325 MG PO TABS
650.0000 mg | ORAL_TABLET | Freq: Once | ORAL | Status: AC
  Administered 2013-04-19: 650 mg via ORAL
  Filled 2013-04-19: qty 2

## 2013-04-19 MED ORDER — GI COCKTAIL ~~LOC~~
30.0000 mL | Freq: Once | ORAL | Status: AC
  Administered 2013-04-19: 30 mL via ORAL
  Filled 2013-04-19: qty 30

## 2013-04-19 MED ORDER — DICYCLOMINE HCL 20 MG PO TABS: 20.0000 mg | ORAL_TABLET | Freq: Two times a day (BID) | ORAL | Status: DC

## 2013-04-19 NOTE — ED Provider Notes (Signed)
CSN: [id number]     Arrival date & time 04/19/13  0957 History   First MD Initiated Contact with Patient 04/19/13 1009     Chief Complaint  Patient presents with  . Abdominal Pain    nausea, no Vomiting, diarrhea   (Consider location/radiation/quality/duration/timing/severity/associated sxs/prior Treatment) HPI  Andrea Daniels is a 22 y.o. female complaining of 6/10 epigastric abdominal pain associated with single episode of slightly looser than normal stool. Onset this a.m. Patient denies fever, sick contacts, nausea vomiting, melena or hematochezia, dysuria. Patient has had several episodes similar exacerbations in the past.   Past Medical History  Diagnosis Date  . UTI (lower urinary tract infection)    History reviewed. No pertinent past surgical history. Family History  Problem Relation Age of Onset  . Hyperlipidemia Other   . Hypertension Other   . Arthritis Other   . Cancer Other     breast cancer  . Hypertension Other   . Diabetes Other    History  Substance Use Topics  . Smoking status: Current Every Day Smoker  . Smokeless tobacco: Never Used  . Alcohol Use: Yes     Comment: 1-5 cans a week   OB History   Grav Para Term Preterm Abortions TAB SAB Ect Mult Living                 Review of Systems  10 systems reviewed and found to be negative, except as noted in the HPI   Allergies  Review of patient's allergies indicates no known allergies.  Home Medications   Current Outpatient Rx  Name  Route  Sig  Dispense  Refill  . albuterol (PROVENTIL HFA;VENTOLIN HFA) 108 (90 BASE) MCG/ACT inhaler   Inhalation   Inhale 2 puffs into the lungs every 6 (six) hours as needed for wheezing.   1 Inhaler   2   . ibuprofen (ADVIL,MOTRIN) 200 MG tablet   Oral   Take 800 mg by mouth every 6 (six) hours as needed for pain.          BP 148/108  Pulse 95  Temp(Src) 98.7 F (37.1 C) (Oral)  Resp 18  SpO2 97%  LMP 03/13/2013 Physical Exam  Nursing note and vitals  reviewed. Constitutional: She is oriented to person, place, and time. She appears well-developed and well-nourished. No distress.  HENT:  Head: Normocephalic.  Eyes: Conjunctivae and EOM are normal.  Neck: Normal range of motion. Neck supple.  Cardiovascular: Normal rate, regular rhythm and intact distal pulses.   Pulmonary/Chest: Effort normal and breath sounds normal. No stridor. No respiratory distress. She has no wheezes. She has no rales. She exhibits no tenderness.  Abdominal: Soft. Bowel sounds are normal.  Musculoskeletal: Normal range of motion.  Neurological: She is alert and oriented to person, place, and time.  Psychiatric: She has a normal mood and affect.    ED Course  Procedures (including critical care time) Labs Review Labs Reviewed - No data to display Imaging Review No results found.  EKG Interpretation   None       MDM   1. Abdominal pain    Filed Vitals:   04/19/13 1200 04/19/13 1215 04/19/13 1300 04/19/13 1400  BP: 141/87 138/93 132/84 142/103  Pulse: 87 93 100 107  Temp:      TempSrc:      Resp:      SpO2: 97% 96% 94% 98%     Andrea Daniels is a 22 y.o. female with isolated  epigastric abdominal pain and single episode of loose stool earlier in the day. Serial abdominal exams remained benign. Patient's blood work and urinalysis are nonacute. Patient's pain has improved with a GI cocktail and Bentyl. I have encouraged patient to follow with her primary care physician if she's had several similar episodes in the past and is also reasonable to have a GI evaluation for IBS.  Medications  dicyclomine (BENTYL) capsule 10 mg (10 mg Oral Given 04/19/13 1123)  acetaminophen (TYLENOL) tablet 650 mg (650 mg Oral Given 04/19/13 1123)  gi cocktail (Maalox,Lidocaine,Donnatal) (30 mLs Oral Given 04/19/13 1222)    Pt is hemodynamically stable, appropriate for, and amenable to discharge at this time. Pt verbalized understanding and agrees with care plan. All  questions answered. Outpatient follow-up and specific return precautions discussed.    Discharge Medication List as of 04/19/2013  1:23 PM    START taking these medications   Details  dicyclomine (BENTYL) 20 MG tablet Take 1 tablet (20 mg total) by mouth 2 (two) times daily., Starting 04/19/2013, Until Discontinued, Print        Note: Portions of this report may have been transcribed using voice recognition software. Every effort was made to ensure accuracy; however, inadvertent computerized transcription errors may be present      Wynetta Emery, PA-C 04/20/13 1537

## 2013-04-19 NOTE — ED Notes (Signed)
Family at bedside. 

## 2013-04-19 NOTE — ED Notes (Signed)
Patient is alert and orientedx4.  Patient was explained discharge instructions and they understood them with no questions.   Andrea Daniels, her friend is taking the patient home.

## 2013-04-19 NOTE — ED Notes (Signed)
Patient said she woke up with abdominal pain, nausea no vomiting.  The patient also said she had one instance of diarrhea.  She rates her pain a 10/10 in the epigastric area and her stomach is distended but non tender to touch.

## 2013-04-20 NOTE — ED Provider Notes (Signed)
Medical screening examination/treatment/procedure(s) were performed by non-physician practitioner and as supervising physician I was immediately available for consultation/collaboration.    Devoria Albe, MD, Armando Gang   Ward Givens, MD 04/20/13 [phone]

## 2013-05-02 ENCOUNTER — Other Ambulatory Visit: Payer: Self-pay

## 2013-05-11 ENCOUNTER — Emergency Department (HOSPITAL_COMMUNITY)
Admission: EM | Admit: 2013-05-11 | Discharge: 2013-05-11 | Disposition: A | Discharge status: Home / Self Care | Payer: 59 | Attending: Emergency Medicine | Admitting: Emergency Medicine

## 2013-05-11 ENCOUNTER — Encounter (HOSPITAL_COMMUNITY): Payer: Self-pay | Admitting: Emergency Medicine

## 2013-05-11 DIAGNOSIS — J45909 Unspecified asthma, uncomplicated: Secondary | ICD-10-CM | POA: Insufficient documentation

## 2013-05-11 DIAGNOSIS — Z87440 Personal history of urinary (tract) infections: Secondary | ICD-10-CM | POA: Insufficient documentation

## 2013-05-11 DIAGNOSIS — K2970 Gastritis, unspecified, without bleeding: Secondary | ICD-10-CM

## 2013-05-11 DIAGNOSIS — R112 Nausea with vomiting, unspecified: Secondary | ICD-10-CM

## 2013-05-11 DIAGNOSIS — Z3202 Encounter for pregnancy test, result negative: Secondary | ICD-10-CM | POA: Insufficient documentation

## 2013-05-11 DIAGNOSIS — F17200 Nicotine dependence, unspecified, uncomplicated: Secondary | ICD-10-CM | POA: Insufficient documentation

## 2013-05-11 DIAGNOSIS — R197 Diarrhea, unspecified: Secondary | ICD-10-CM | POA: Insufficient documentation

## 2013-05-11 DIAGNOSIS — R51 Headache: Secondary | ICD-10-CM | POA: Insufficient documentation

## 2013-05-11 DIAGNOSIS — Z79899 Other long term (current) drug therapy: Secondary | ICD-10-CM | POA: Insufficient documentation

## 2013-05-11 HISTORY — DX: Unspecified asthma, uncomplicated: J45.909

## 2013-05-11 LAB — URINALYSIS, ROUTINE W REFLEX MICROSCOPIC
Bilirubin Urine: NEGATIVE
Glucose, UA: NEGATIVE mg/dL
Hgb urine dipstick: NEGATIVE
Ketones, ur: 15 mg/dL — AB
Leukocytes, UA: NEGATIVE
Nitrite: POSITIVE — AB
Protein, ur: NEGATIVE mg/dL
Specific Gravity, Urine: 1.023 (ref 1.005–1.030)
Urobilinogen, UA: 0.2 mg/dL (ref 0.0–1.0)
pH: 6 (ref 5.0–8.0)

## 2013-05-11 LAB — COMPREHENSIVE METABOLIC PANEL
ALT: 28 U/L (ref 0–35)
AST: 30 U/L (ref 0–37)
Albumin: 4.1 g/dL (ref 3.5–5.2)
Alkaline Phosphatase: 71 U/L (ref 39–117)
BUN: 6 mg/dL (ref 6–23)
CO2: 20 mEq/L (ref 19–32)
Calcium: 9 mg/dL (ref 8.4–10.5)
Chloride: 105 mEq/L (ref 96–112)
Creatinine, Ser: 0.67 mg/dL (ref 0.50–1.10)
GFR calc Af Amer: >90 mL/min (ref >90)
GFR calc non Af Amer: >90 mL/min (ref >90)
Glucose, Bld: 88 mg/dL (ref 70–99)
Potassium: 4 mEq/L (ref 3.5–5.1)
Sodium: 140 mEq/L (ref 135–145)
Total Bilirubin: 0.4 mg/dL (ref 0.3–1.2)
Total Protein: 8.5 g/dL — ABNORMAL HIGH (ref 6.0–8.3)

## 2013-05-11 LAB — URINE MICROSCOPIC-ADD ON

## 2013-05-11 LAB — CBC WITH DIFFERENTIAL/PLATELET
Basophils Absolute: 0 10*3/uL (ref 0.0–0.1)
Basophils Relative: 0 % (ref 0–1)
Eosinophils Absolute: 0 10*3/uL (ref 0.0–0.7)
Eosinophils Relative: 0 % (ref 0–5)
HCT: 41.2 % (ref 36.0–46.0)
Hemoglobin: 14.5 g/dL (ref 12.0–15.0)
Lymphocytes Relative: 18 % (ref 12–46)
Lymphs Abs: 1.3 10*3/uL (ref 0.7–4.0)
MCH: 28.3 pg (ref 26.0–34.0)
MCHC: 35.2 g/dL (ref 30.0–36.0)
MCV: 80.3 fL (ref 78.0–100.0)
Monocytes Absolute: 0.2 10*3/uL (ref 0.1–1.0)
Monocytes Relative: 3 % (ref 3–12)
Neutro Abs: 5.4 10*3/uL (ref 1.7–7.7)
Neutrophils Relative %: 78 % — ABNORMAL HIGH (ref 43–77)
Platelets: 329 10*3/uL (ref 150–400)
RBC: 5.13 MIL/uL — ABNORMAL HIGH (ref 3.87–5.11)
RDW: 13 % (ref 11.5–15.5)
WBC: 7 10*3/uL (ref 4.0–10.5)

## 2013-05-11 LAB — PREGNANCY, URINE: Preg Test, Ur: NEGATIVE

## 2013-05-11 LAB — LIPASE, BLOOD: Lipase: 24 U/L (ref 11–59)

## 2013-05-11 MED ORDER — HYDROMORPHONE HCL PF 1 MG/ML IJ SOLN
1.0000 mg | Freq: Once | INTRAMUSCULAR | Status: AC
  Administered 2013-05-11: 1 mg via INTRAVENOUS
  Filled 2013-05-11: qty 1

## 2013-05-11 MED ORDER — SODIUM CHLORIDE 0.9 % IV BOLUS (SEPSIS)
1000.0000 mL | Freq: Once | INTRAVENOUS | Status: AC
  Administered 2013-05-11: 1000 mL via INTRAVENOUS

## 2013-05-11 MED ORDER — ONDANSETRON HCL 4 MG PO TABS
4.0000 mg | ORAL_TABLET | Freq: Three times a day (TID) | ORAL | Status: DC | PRN
Start: 2013-05-11 — End: 2013-07-11

## 2013-05-11 MED ORDER — ONDANSETRON HCL 4 MG/2ML IJ SOLN
4.0000 mg | Freq: Once | INTRAMUSCULAR | Status: AC
  Administered 2013-05-11: 4 mg via INTRAVENOUS
  Filled 2013-05-11: qty 2

## 2013-05-11 NOTE — ED Provider Notes (Signed)
CSN: [id number]     Arrival date & time 05/11/13  1034 History   First MD Initiated Contact with Patient 05/11/13 1059     Chief Complaint  Patient presents with  . Abdominal Pain  . Headache  . Nausea  . Emesis  . Diarrhea   (Consider location/radiation/quality/duration/timing/severity/associated sxs/prior Treatment) Patient is a 23 y.o. female presenting with abdominal pain, headaches, vomiting, and diarrhea.  Abdominal Pain Associated symptoms: diarrhea and vomiting   Headache Associated symptoms: abdominal pain, diarrhea and vomiting   Emesis Associated symptoms: abdominal pain, diarrhea and headaches   Diarrhea Associated symptoms: abdominal pain, headaches and vomiting    Pt with history of intermittent bouts of abdominal pain, nausea and vomiting off and on for months reports onset of similar symptoms last night after drinking EtOH and continuing this morning. No blood in emesis or stool. Complaining of diffuse abdominal soreness and nausea now. No fever.   Past Medical History  Diagnosis Date  . UTI (lower urinary tract infection)   . Asthma    History reviewed. No pertinent past surgical history. Family History  Problem Relation Age of Onset  . Hyperlipidemia Other   . Hypertension Other   . Arthritis Other   . Cancer Other     breast cancer  . Hypertension Other   . Diabetes Other    History  Substance Use Topics  . Smoking status: Current Every Day Smoker  . Smokeless tobacco: Never Used  . Alcohol Use: Yes     Comment: 1-5 cans a week   OB History   Grav Para Term Preterm Abortions TAB SAB Ect Mult Living                 Review of Systems  Gastrointestinal: Positive for vomiting, abdominal pain and diarrhea.  Neurological: Positive for headaches.   All other systems reviewed and are negative except as noted in HPI.   Allergies  Review of patient's allergies indicates no known allergies.  Home Medications   Current Outpatient Rx  Name  Route   Sig  Dispense  Refill  . albuterol (PROVENTIL HFA;VENTOLIN HFA) 108 (90 BASE) MCG/ACT inhaler   Inhalation   Inhale 2 puffs into the lungs every 6 (six) hours as needed for wheezing.   1 Inhaler   2   . dicyclomine (BENTYL) 20 MG tablet   Oral   Take 1 tablet (20 mg total) by mouth 2 (two) times daily.   20 tablet   0    BP 149/71  Pulse 79  Temp(Src) 97.8 F (36.6 C) (Oral)  Resp 30  SpO2 100%  LMP 04/26/2013 Physical Exam  Nursing note and vitals reviewed. Constitutional: She is oriented to person, place, and time. She appears well-developed and well-nourished.  HENT:  Head: Normocephalic and atraumatic.  Eyes: EOM are normal. Pupils are equal, round, and reactive to light.  Neck: Normal range of motion. Neck supple.  Cardiovascular: Normal rate, normal heart sounds and intact distal pulses.   Pulmonary/Chest: Effort normal and breath sounds normal.  Abdominal: Bowel sounds are normal. She exhibits no distension. There is tenderness (diffuse, mild, no peritoneal signs). There is no rebound and no guarding.  Musculoskeletal: Normal range of motion. She exhibits no edema and no tenderness.  Neurological: She is alert and oriented to person, place, and time. She has normal strength. No cranial nerve deficit or sensory deficit.  Skin: Skin is warm and dry. No rash noted.  Psychiatric: She has a normal mood  and affect.    ED Course  Procedures (including critical care time) Labs Review Labs Reviewed  CBC WITH DIFFERENTIAL - Abnormal; Notable for the following:    RBC 5.13 (*)    Neutrophils Relative % 78 (*)    All other components within normal limits  COMPREHENSIVE METABOLIC PANEL - Abnormal; Notable for the following:    Total Protein 8.5 (*)    All other components within normal limits  URINALYSIS, ROUTINE W REFLEX MICROSCOPIC - Abnormal; Notable for the following:    APPearance CLOUDY (*)    Ketones, ur 15 (*)    Nitrite POSITIVE (*)    All other components  within normal limits  URINE MICROSCOPIC-ADD ON - Abnormal; Notable for the following:    Bacteria, UA MANY (*)    All other components within normal limits  LIPASE, BLOOD  PREGNANCY, URINE   Imaging Review No results found.  EKG Interpretation   None       MDM   1. Nausea with vomiting   2. Gastritis     Labs reviewed and unremarkable, tolerating PO here without vomiting in the ED. Recently started Bentyl for same, advised to continue and follow up with PCP/GI as needed.    Charles B. Bernette Mayers, MD 05/11/13 1309

## 2013-05-11 NOTE — ED Notes (Signed)
Pt arrived from home by [facility] with c/o h/a, n/v/d, and abdominal pain. EMS reported some alcohol use last night. Pt stated that her headache started last night and she woke up early this morning with abd pain, n/v/d. Denies any blood in stool.

## 2013-06-07 ENCOUNTER — Inpatient Hospital Stay (HOSPITAL_COMMUNITY)
Admission: EM | Admit: 2013-06-07 | Discharge: 2013-06-09 | DRG: 203 | Disposition: A | Discharge status: Home / Self Care | Payer: 59 | Attending: Internal Medicine | Admitting: Internal Medicine

## 2013-06-07 ENCOUNTER — Encounter (HOSPITAL_COMMUNITY): Payer: Self-pay | Admitting: Emergency Medicine

## 2013-06-07 ENCOUNTER — Emergency Department (HOSPITAL_COMMUNITY): Payer: 59

## 2013-06-07 DIAGNOSIS — J111 Influenza due to unidentified influenza virus with other respiratory manifestations: Secondary | ICD-10-CM | POA: Diagnosis present

## 2013-06-07 DIAGNOSIS — G8929 Other chronic pain: Secondary | ICD-10-CM | POA: Diagnosis present

## 2013-06-07 DIAGNOSIS — Z23 Encounter for immunization: Secondary | ICD-10-CM

## 2013-06-07 DIAGNOSIS — R062 Wheezing: Secondary | ICD-10-CM

## 2013-06-07 DIAGNOSIS — J101 Influenza due to other identified influenza virus with other respiratory manifestations: Secondary | ICD-10-CM

## 2013-06-07 DIAGNOSIS — I498 Other specified cardiac arrhythmias: Secondary | ICD-10-CM | POA: Diagnosis present

## 2013-06-07 DIAGNOSIS — R112 Nausea with vomiting, unspecified: Secondary | ICD-10-CM

## 2013-06-07 DIAGNOSIS — Z87440 Personal history of urinary (tract) infections: Secondary | ICD-10-CM

## 2013-06-07 DIAGNOSIS — Z833 Family history of diabetes mellitus: Secondary | ICD-10-CM

## 2013-06-07 DIAGNOSIS — F17200 Nicotine dependence, unspecified, uncomplicated: Secondary | ICD-10-CM | POA: Diagnosis present

## 2013-06-07 DIAGNOSIS — R109 Unspecified abdominal pain: Secondary | ICD-10-CM | POA: Diagnosis present

## 2013-06-07 DIAGNOSIS — Z803 Family history of malignant neoplasm of breast: Secondary | ICD-10-CM

## 2013-06-07 DIAGNOSIS — J45902 Unspecified asthma with status asthmaticus: Principal | ICD-10-CM | POA: Diagnosis present

## 2013-06-07 LAB — CBC
HCT: 39.1 % (ref 36.0–46.0)
Hemoglobin: 13.7 g/dL (ref 12.0–15.0)
MCH: 28.2 pg (ref 26.0–34.0)
MCHC: 35 g/dL (ref 30.0–36.0)
MCV: 80.5 fL (ref 78.0–100.0)
Platelets: 311 10*3/uL (ref 150–400)
RBC: 4.86 MIL/uL (ref 3.87–5.11)
RDW: 13.2 % (ref 11.5–15.5)
WBC: 6.2 10*3/uL (ref 4.0–10.5)

## 2013-06-07 LAB — BASIC METABOLIC PANEL
BUN: 4 mg/dL — ABNORMAL LOW (ref 6–23)
CO2: 22 mEq/L (ref 19–32)
Calcium: 9.2 mg/dL (ref 8.4–10.5)
Chloride: 103 mEq/L (ref 96–112)
Creatinine, Ser: 0.77 mg/dL (ref 0.50–1.10)
GFR calc Af Amer: >90 mL/min (ref >90)
GFR calc non Af Amer: >90 mL/min (ref >90)
Glucose, Bld: 102 mg/dL — ABNORMAL HIGH (ref 70–99)
Potassium: 3.7 mEq/L (ref 3.5–5.1)
Sodium: 138 mEq/L (ref 135–145)

## 2013-06-07 MED ORDER — PREDNISONE 20 MG PO TABS
60.0000 mg | ORAL_TABLET | Freq: Once | ORAL | Status: AC
  Administered 2013-06-07: 60 mg via ORAL
  Filled 2013-06-07: qty 3

## 2013-06-07 MED ORDER — ACETAMINOPHEN 325 MG PO TABS
650.0000 mg | ORAL_TABLET | Freq: Once | ORAL | Status: AC
  Administered 2013-06-07: 650 mg via ORAL
  Filled 2013-06-07: qty 2

## 2013-06-07 MED ORDER — LORAZEPAM 1 MG PO TABS
1.0000 mg | ORAL_TABLET | ORAL | Status: AC
  Administered 2013-06-07: 1 mg via ORAL
  Filled 2013-06-07: qty 1

## 2013-06-07 MED ORDER — ONDANSETRON HCL 4 MG/2ML IJ SOLN
4.0000 mg | Freq: Once | INTRAMUSCULAR | Status: AC
  Administered 2013-06-07: 4 mg via INTRAVENOUS
  Filled 2013-06-07: qty 2

## 2013-06-07 MED ORDER — ALBUTEROL SULFATE (5 MG/ML) 0.5% IN NEBU
10.0000 mg | INHALATION_SOLUTION | Freq: Once | RESPIRATORY_TRACT | Status: AC
  Administered 2013-06-07: 10 mg via RESPIRATORY_TRACT
  Filled 2013-06-07 (×2): qty 2

## 2013-06-07 MED ORDER — ALBUTEROL (5 MG/ML) CONTINUOUS INHALATION SOLN
10.0000 mg/h | INHALATION_SOLUTION | RESPIRATORY_TRACT | Status: DC
  Administered 2013-06-07: 10 mg/h via RESPIRATORY_TRACT
  Filled 2013-06-07: qty 20

## 2013-06-07 MED ORDER — KETOROLAC TROMETHAMINE 30 MG/ML IJ SOLN
30.0000 mg | Freq: Once | INTRAMUSCULAR | Status: AC
  Administered 2013-06-07: 30 mg via INTRAVENOUS
  Filled 2013-06-07: qty 1

## 2013-06-07 MED ORDER — SODIUM CHLORIDE 0.9 % IV BOLUS (SEPSIS)
1000.0000 mL | Freq: Once | INTRAVENOUS | Status: AC
  Administered 2013-06-07: 1000 mL via INTRAVENOUS

## 2013-06-07 NOTE — ED Notes (Signed)
Pt HR 120s prior to giving albuterol treatment, MD aware. After albuterol, pt's HR 150-160. Notified MD. Obtained EKG. Pt is alert and resting in bed. No further orders at this time.

## 2013-06-07 NOTE — ED Provider Notes (Signed)
CSN: [id number]     Arrival date & time 06/07/13  1716 History   First MD Initiated Contact with Patient 06/07/13 1720     Chief Complaint  Patient presents with  . Cough   (Consider location/radiation/quality/duration/timing/severity/associated sxs/prior Treatment) Patient is a 22 y.o. female presenting with cough.  Cough Associated symptoms: shortness of breath and wheezing   Associated symptoms: no chest pain, no chills, no fever, no headaches, no rash and no rhinorrhea    Andrea Daniels is a 22 y.o. female who presents to the emergency department for concern of cough, congestion, runny nose, malaise, diffuse myalgias, and subjective f/c.  Started yesterday.  No alleviating/aggravating factors.  Moderate in severity.  Patient reports that she has asthma and that this is making this worse.  Now with SOB.  Feels similar to previous asthma exacerbation.  Took some albuterol with improvement.  No other symptoms.  Past Medical History  Diagnosis Date  . UTI (lower urinary tract infection)   . Asthma    History reviewed. No pertinent past surgical history. Family History  Problem Relation Age of Onset  . Hyperlipidemia Other   . Hypertension Other   . Arthritis Other   . Cancer Other     breast cancer  . Hypertension Other   . Diabetes Other    History  Substance Use Topics  . Smoking status: Current Every Day Smoker  . Smokeless tobacco: Never Used  . Alcohol Use: Yes     Comment: 1-5 cans a week   OB History   Grav Para Term Preterm Abortions TAB SAB Ect Mult Living                 Review of Systems  Constitutional: Negative for fever and chills.  HENT: Negative for congestion and rhinorrhea.   Respiratory: Positive for cough, shortness of breath and wheezing.   Cardiovascular: Negative for chest pain.  Gastrointestinal: Positive for nausea and vomiting. Negative for abdominal pain, diarrhea and abdominal distention.  Endocrine: Negative for polyuria.  Genitourinary:  Negative for dysuria.  Musculoskeletal: Negative for neck pain and neck stiffness.  Skin: Negative for rash.  Neurological: Negative for headaches.  Psychiatric/Behavioral: Negative.     Allergies  Review of patient's allergies indicates no known allergies.  Home Medications   Current Outpatient Rx  Name  Route  Sig  Dispense  Refill  . albuterol (PROVENTIL HFA;VENTOLIN HFA) 108 (90 BASE) MCG/ACT inhaler   Inhalation   Inhale 2 puffs into the lungs every 6 (six) hours as needed for wheezing.   1 Inhaler   2   . ondansetron (ZOFRAN) 4 MG tablet   Oral   Take 1 tablet (4 mg total) by mouth every 8 (eight) hours as needed for nausea or vomiting.   12 tablet   0    BP 149/87  Pulse 142  Temp(Src) 99.2 F (37.3 C) (Oral)  Resp 22  SpO2 100%  LMP 04/26/2013 Physical Exam  Nursing note and vitals reviewed. Constitutional: She is oriented to person, place, and time. She appears well-developed and well-nourished. No distress.  HENT:  Head: Normocephalic and atraumatic.  Right Ear: External ear normal.  Left Ear: External ear normal.  Nose: Nose normal.  Mouth/Throat: Oropharynx is clear and moist. No oropharyngeal exudate.  Eyes: EOM are normal. Pupils are equal, round, and reactive to light.  Neck: Normal range of motion. Neck supple. No tracheal deviation present.  Cardiovascular: Normal rate.   Pulmonary/Chest: Effort normal. No stridor.  No respiratory distress. She has wheezes (diffuse, symmetric). She has no rales.  Abdominal: Soft. She exhibits no distension. There is no tenderness. There is no rebound.  Musculoskeletal: Normal range of motion.  Neurological: She is alert and oriented to person, place, and time.  Skin: Skin is warm and dry. She is not diaphoretic.    ED Course  Procedures (including critical care time) Labs Review Labs Reviewed  BASIC METABOLIC PANEL - Abnormal; Notable for the following:    Glucose, Bld 102 (*)    BUN 4 (*)    All other  components within normal limits  CBC   Imaging Review No results found.  EKG Interpretation    Date/Time:  Friday June 07 2013 19:06:16 EST Ventricular Rate:  153 PR Interval:  78 QRS Duration: 68 QT Interval:  283 QTC Calculation: 451 R Axis:   71 Text Interpretation:  Age not entered, assumed to be  22 years old for purpose of ECG interpretation Sinus tachycardia Nonspecific repol abnormality, diffuse leads compared to prior, rate is increased and nonspecific ST/T changes now present.   Confirmed by [facility]  MD, TREY ([id number]) on 06/07/2013 7:11:39 PM            MDM   1. Status asthmaticus   2. Nausea with vomiting   3. Wheezing    Andrea Daniels is a 22 y.o. female with history of asthma who presents to the emergency department with flu like symptoms.  On exam, mildly tachycardic and with mild wheezing.  Albuterol administered with some resolution of symptoms, however patient still hypoxic.  Requiring admission for further monitoring and treatment.  No longer in distress.  Patient safe for admission.  No indication for ICU treatment.  Patient admitted.    Arloa Koh, MD 06/09/13 [phone]

## 2013-06-07 NOTE — ED Notes (Addendum)
Pt has been in the bathroom vomiting for the last . Had 1 episode of incontinence. Will be moved to B15. For IV placement. Ok'd with PA and charge RN

## 2013-06-07 NOTE — ED Notes (Signed)
Pt ambulatory to bathroom

## 2013-06-07 NOTE — Progress Notes (Signed)
Pts HR currently 136. Verified with MD before starting 10mg  Albuterol CAT.

## 2013-06-07 NOTE — ED Notes (Addendum)
Pt with one episode of vomiting while in ED. Denies recent fever or abdominal pain at present. States hx of gastritis.

## 2013-06-07 NOTE — ED Provider Notes (Signed)
Patient at triage presents with cough and congestion has been ongoing for the past couple of days. This provider was unable to see the patient for the patient was in the bathroom for approximately 25-30 minutes with continuous retching and vomiting. Patient has a low-grade fever of 99.73F, tachycardic with a heart rate of 117 beats per minute. This provider ordered IV saline lock, IV Zofran, IV fluids. Patient does not meet fast-track criteria. Patient requires further workup to be performed, IV fluids. Patient transferred to main Emergency Department. Patient stable for transfer.   Raymon Mutton, PA-C 06/08/13 [phone]

## 2013-06-07 NOTE — ED Notes (Signed)
Pt in c/o cough and congestion over the last few days, no relief with OTC medications, unknown fever at home

## 2013-06-08 ENCOUNTER — Encounter (HOSPITAL_COMMUNITY): Payer: Self-pay | Admitting: *Deleted

## 2013-06-08 DIAGNOSIS — J45902 Unspecified asthma with status asthmaticus: Secondary | ICD-10-CM | POA: Diagnosis present

## 2013-06-08 DIAGNOSIS — R062 Wheezing: Secondary | ICD-10-CM

## 2013-06-08 DIAGNOSIS — R112 Nausea with vomiting, unspecified: Secondary | ICD-10-CM

## 2013-06-08 LAB — MRSA PCR SCREENING: MRSA by PCR: NEGATIVE

## 2013-06-08 LAB — PREGNANCY, URINE: Preg Test, Ur: NEGATIVE

## 2013-06-08 LAB — INFLUENZA PANEL BY PCR (TYPE A & B)
H1N1 flu by pcr: NOT DETECTED
Influenza A By PCR: POSITIVE — AB
Influenza B By PCR: NEGATIVE

## 2013-06-08 MED ORDER — ACETAMINOPHEN 325 MG PO TABS: 650.0000 mg | ORAL_TABLET | Freq: Four times a day (QID) | ORAL | Status: DC | PRN

## 2013-06-08 MED ORDER — LEVALBUTEROL HCL 0.63 MG/3ML IN NEBU
0.6300 mg | INHALATION_SOLUTION | Freq: Four times a day (QID) | RESPIRATORY_TRACT | Status: DC
  Administered 2013-06-08 – 2013-06-09 (×3): 0.63 mg via RESPIRATORY_TRACT
  Filled 2013-06-08 (×4): qty 3

## 2013-06-08 MED ORDER — KETOROLAC TROMETHAMINE 30 MG/ML IJ SOLN
30.0000 mg | Freq: Three times a day (TID) | INTRAMUSCULAR | Status: DC | PRN
  Administered 2013-06-08: 30 mg via INTRAVENOUS
  Filled 2013-06-08 (×2): qty 1

## 2013-06-08 MED ORDER — GUAIFENESIN-DM 100-10 MG/5ML PO SYRP
5.0000 mL | ORAL_SOLUTION | ORAL | Status: DC | PRN
  Administered 2013-06-08 – 2013-06-09 (×3): 5 mL via ORAL
  Filled 2013-06-08 (×2): qty 5

## 2013-06-08 MED ORDER — OSELTAMIVIR PHOSPHATE 75 MG PO CAPS
75.0000 mg | ORAL_CAPSULE | Freq: Two times a day (BID) | ORAL | Status: DC
  Administered 2013-06-08 – 2013-06-09 (×2): 75 mg via ORAL
  Filled 2013-06-08 (×4): qty 1

## 2013-06-08 MED ORDER — LEVALBUTEROL HCL 0.63 MG/3ML IN NEBU: 0.6300 mg | INHALATION_SOLUTION | RESPIRATORY_TRACT | Status: DC | PRN

## 2013-06-08 MED ORDER — PNEUMOCOCCAL VAC POLYVALENT 25 MCG/0.5ML IJ INJ
0.5000 mL | INJECTION | INTRAMUSCULAR | Status: AC
  Administered 2013-06-09: 0.5 mL via INTRAMUSCULAR
  Filled 2013-06-08: qty 0.5

## 2013-06-08 MED ORDER — ALBUTEROL SULFATE (5 MG/ML) 0.5% IN NEBU
2.5000 mg | INHALATION_SOLUTION | Freq: Once | RESPIRATORY_TRACT | Status: AC
  Administered 2013-06-08: 2.5 mg via RESPIRATORY_TRACT
  Filled 2013-06-08: qty 0.5

## 2013-06-08 MED ORDER — LEVALBUTEROL HCL 0.63 MG/3ML IN NEBU
0.6300 mg | INHALATION_SOLUTION | Freq: Four times a day (QID) | RESPIRATORY_TRACT | Status: DC | PRN
  Administered 2013-06-08: 0.63 mg via RESPIRATORY_TRACT
  Filled 2013-06-08: qty 3

## 2013-06-08 MED ORDER — SODIUM CHLORIDE 0.9 % IJ SOLN
3.0000 mL | Freq: Two times a day (BID) | INTRAMUSCULAR | Status: DC
  Administered 2013-06-08 – 2013-06-09 (×3): 3 mL via INTRAVENOUS

## 2013-06-08 MED ORDER — METHYLPREDNISOLONE SODIUM SUCC 125 MG IJ SOLR
60.0000 mg | Freq: Two times a day (BID) | INTRAMUSCULAR | Status: AC
  Administered 2013-06-08 (×3): 60 mg via INTRAVENOUS
  Filled 2013-06-08 (×2): qty 0.96
  Filled 2013-06-08: qty 2

## 2013-06-08 MED ORDER — INFLUENZA VAC SPLIT QUAD 0.5 ML IM SUSP
0.5000 mL | INTRAMUSCULAR | Status: AC
  Administered 2013-06-09: 0.5 mL via INTRAMUSCULAR
  Filled 2013-06-08: qty 0.5

## 2013-06-08 MED ORDER — MAGNESIUM SULFATE 40 MG/ML IJ SOLN
2.0000 g | Freq: Once | INTRAMUSCULAR | Status: AC
  Administered 2013-06-08: 2 g via INTRAVENOUS
  Filled 2013-06-08: qty 50

## 2013-06-08 MED ORDER — METHYLPREDNISOLONE SODIUM SUCC 125 MG IJ SOLR: 60.0000 mg | Freq: Four times a day (QID) | INTRAMUSCULAR | Status: DC

## 2013-06-08 MED ORDER — ENOXAPARIN SODIUM 40 MG/0.4ML ~~LOC~~ SOLN
40.0000 mg | SUBCUTANEOUS | Status: DC
  Administered 2013-06-08: 40 mg via SUBCUTANEOUS
  Filled 2013-06-08 (×2): qty 0.4

## 2013-06-08 MED ORDER — IPRATROPIUM BROMIDE 0.02 % IN SOLN
0.5000 mg | Freq: Once | RESPIRATORY_TRACT | Status: AC
  Administered 2013-06-08: 0.5 mg via RESPIRATORY_TRACT
  Filled 2013-06-08: qty 2.5

## 2013-06-08 MED ORDER — PREDNISONE 50 MG PO TABS
60.0000 mg | ORAL_TABLET | Freq: Every day | ORAL | Status: DC
  Administered 2013-06-09: 60 mg via ORAL
  Filled 2013-06-08 (×3): qty 1

## 2013-06-08 NOTE — H&P (Signed)
[facility] History and Physical  Andrea Daniels ZOX:[id number] DOB: [birth year] DOA: 06/07/2013  Referring physician: EDP PCP: Oliver Barre, MD   Chief Complaint: Asthma attack   HPI: Andrea Daniels is a 22 y.o. female presents to ED with cough and wheezing ongoing for past couple of days.  Review of Systems: positive for chronic abdominal pain, n/v, Systems reviewed.  As above, otherwise negative  Past Medical History  Diagnosis Date  . UTI (lower urinary tract infection)   . Asthma    History reviewed. No pertinent past surgical history. Social History:  reports that she has been smoking.  She has never used smokeless tobacco. She reports that she drinks alcohol. She reports that she does not use illicit drugs.  No Known Allergies  Family History  Problem Relation Age of Onset  . Hyperlipidemia Other   . Hypertension Other   . Arthritis Other   . Cancer Other     breast cancer  . Hypertension Other   . Diabetes Other      Prior to Admission medications   Medication Sig Start Date End Date Taking? Authorizing Provider  albuterol (PROVENTIL HFA;VENTOLIN HFA) 108 (90 BASE) MCG/ACT inhaler Inhale 2 puffs into the lungs every 6 (six) hours as needed for wheezing. 08/16/12  Yes Corwin Levins, MD  ondansetron (ZOFRAN) 4 MG tablet Take 1 tablet (4 mg total) by mouth every 8 (eight) hours as needed for nausea or vomiting. 05/11/13  Yes Charles B. Bernette Mayers, MD   Physical Exam: Filed Vitals:   06/08/13 0100  BP: 122/65  Pulse: 145  Temp:   Resp: 12    BP 122/65  Pulse 145  Temp(Src) 98.8 F (37.1 C) (Oral)  Resp 12  SpO2 98%  LMP 05/17/2013  General Appearance:    Alert, oriented, no distress, appears stated age  Head:    Normocephalic, atraumatic  Eyes:    PERRL, EOMI, sclera non-icteric        Nose:   Nares without drainage or epistaxis. Mucosa, turbinates normal  Throat:   Moist mucous membranes. Oropharynx without erythema or exudate.  Neck:   Supple. No  carotid bruits.  No thyromegaly.  No lymphadenopathy.   Back:     No CVA tenderness, no spinal tenderness  Lungs:     Clear to auscultation bilaterally, without wheezes, rhonchi or rales  Chest wall:    No tenderness to palpitation  Heart:    Regular rate and rhythm without murmurs, gallops, rubs  Abdomen:     Soft, non-tender, nondistended, normal bowel sounds, no organomegaly  Genitalia:    deferred  Rectal:    deferred  Extremities:   No clubbing, cyanosis or edema.  Pulses:   2+ and symmetric all extremities  Skin:   Skin color, texture, turgor normal, no rashes or lesions  Lymph nodes:   Cervical, supraclavicular, and axillary nodes normal  Neurologic:   CNII-XII intact. Normal strength, sensation and reflexes      throughout    Labs on Admission:  Basic Metabolic Panel:  Recent Labs Lab 06/07/13 1825  NA 138  K 3.7  CL 103  CO2 22  GLUCOSE 102*  BUN 4*  CREATININE 0.77  CALCIUM 9.2   Liver Function Tests: No results found for this basename: AST, ALT, ALKPHOS, BILITOT, PROT, ALBUMIN,  in the last 168 hours No results found for this basename: LIPASE, AMYLASE,  in the last 168 hours No results found for this basename: AMMONIA,  in the  last 168 hours CBC:  Recent Labs Lab 06/07/13 1825  WBC 6.2  HGB 13.7  HCT 39.1  MCV 80.5  PLT 311   Cardiac Enzymes: No results found for this basename: CKTOTAL, CKMB, CKMBINDEX, TROPONINI,  in the last 168 hours  BNP (last 3 results) No results found for this basename: PROBNP,  in the last 8760 hours CBG: No results found for this basename: GLUCAP,  in the last 168 hours  Radiological Exams on Admission: Dg Chest 2 View  06/07/2013   CLINICAL DATA:  Three day history of cough and chest congestion. Onset of fever and chest pain today. Headache and dizziness.  EXAM: CHEST  2 VIEW  COMPARISON:  08/16/2012.  FINDINGS: Cardiomediastinal silhouette unremarkable. Lungs clear. Bronchovascular markings normal. Pulmonary vascularity  normal. No pneumothorax. No pleural effusions. Visualized bony thorax intact.  IMPRESSION: Normal examination.   Electronically Signed   By: Hulan Saas M.D.   On: 06/07/2013 20:09    EKG: Independently reviewed.  Assessment/Plan Principal Problem:   Status asthmaticus   1. Status asthmaticus - admitting patient to SDU, adult wheeze protocol, O2 via protocol, adding magnesium, steroids. 2. Chronic abdominal pain - with N/V, patient frequents the ED often with this complaint, will attempt to manage with Toradol IV and avoid IV dilaudid at this time.    Code Status: Full  Family Communication: Family at bedside Disposition Plan: Admit to inpatient   Time spent: 50 min  Jarry Manon M. [facility] Pager [phone]  If 7AM-7PM, please contact the day team taking care of the patient [email or web address] Password [email or web address] 06/08/2013, 2:02 AM

## 2013-06-08 NOTE — Progress Notes (Signed)
[facility] Progress Note [place] TEAM 1 - Stepdown/ICU TEAM   Andrea Daniels WUJ:[id number] DOB: 11-26-1990 DOA: 06/07/2013 PCP: Oliver Barre, MD  Admit HPI / Brief Narrative: 22 y.o. female presented to the ED with cough and wheezing for a couple of days.  HPI/Subjective: Pt seen for f/u visit.   Assessment/Plan:  Acute exacerbation of chronic asthma / status asthmaticus Much improved - rapidly wean steroids - counseled on absolute need to stop smoking - wean O2 as able - ambulate - possible d/c in 24-48hrs - would benefit from outpt PFTs to more clearly define the nature of her disease and allow tayloring of medical tx   Sinus tachycardia Due to albuterol nebs - change to xopenex and follow   Chronic abdom pain with N/V patient frequents the ED often with this complaint - well controlled at time of exam today - avoid narcotics which will lead to constipation and worsen pain  Tobacco abuse  counseled on absolute need to stop smoking   Code Status: FULL Family Communication: spoke w/ pt and both parents at bedside  Disposition Plan: stable for transfer to medical bed - wean steroids and O2 - hopeful for d/c home in 24-48hrs  Consultants: none  Procedures: none  Antibiotics: none  DVT prophylaxis: lovenox started today  Objective: Blood pressure 123/70, pulse 134, temperature 98.6 F (37 C), temperature source Oral, resp. rate 27, height 4\' 11"  (1.499 m), weight 64 kg (141 lb 1.5 oz), last menstrual period 05/17/2013, SpO2 100.00%.  Intake/Output Summary (Last 24 hours) at 06/08/13 1422 Last data filed at 06/08/13 1029  Gross per 24 hour  Intake    243 ml  Output      0 ml  Net    243 ml   Exam: F/U exam completed  Data Reviewed: Basic Metabolic Panel:  Recent Labs Lab 06/07/13 1825  NA 138  K 3.7  CL 103  CO2 22  GLUCOSE 102*  BUN 4*  CREATININE 0.77  CALCIUM 9.2   Liver Function Tests: No results found for this basename: AST, ALT,  ALKPHOS, BILITOT, PROT, ALBUMIN,  in the last 168 hours No results found for this basename: LIPASE, AMYLASE,  in the last 168 hours No results found for this basename: AMMONIA,  in the last 168 hours  CBC:  Recent Labs Lab 06/07/13 1825  WBC 6.2  HGB 13.7  HCT 39.1  MCV 80.5  PLT 311    Recent Results (from the past 240 hour(s))  MRSA PCR SCREENING     Status: None   Collection Time    06/08/13  2:23 AM      Result Value Range Status   MRSA by PCR NEGATIVE  NEGATIVE Final   Comment:            The GeneXpert MRSA Assay (FDA     approved for NASAL specimens     only), is one component of a     comprehensive MRSA colonization     surveillance program. It is not     intended to diagnose MRSA     infection nor to guide or     monitor treatment for     MRSA infections.     Studies:  Recent x-ray studies have been reviewed in detail by the Attending Physician  Scheduled Meds:  Scheduled Meds: . enoxaparin (LOVENOX) injection  40 mg Subcutaneous Q24H  . [START ON 06/09/2013] influenza vac split quadrivalent PF  0.5 mL Intramuscular Tomorrow-1000  .  levalbuterol  0.63 mg Nebulization Q6H  . methylPREDNISolone (SOLU-MEDROL) injection  60 mg Intravenous Q12H  . [START ON 06/09/2013] pneumococcal 23 valent vaccine  0.5 mL Intramuscular Tomorrow-1000  . [START ON 06/09/2013] predniSONE  60 mg Oral Q breakfast  . sodium chloride  3 mL Intravenous Q12H    Time spent on care of this patient: 25+ mins   [facility] T  [facility] Office  [phone] Pager - Text Page per Loretha Stapler as per below:  On-Call/Text Page:      [email or web address]      password [email or web address]  If 7PM-7AM, please contact night-coverage [email or web address] Password [email or web address] 06/08/2013, 2:22 PM   LOS: 1 day

## 2013-06-08 NOTE — Progress Notes (Signed)
Pt. Showing signs/symptoms of flu; cough, runny nose, fever (99.9), and n/v.  MD made aware; received orders for flu pcr and droplet precautions.  Will continue to monitor.  Vivi Martens RN

## 2013-06-08 NOTE — Progress Notes (Signed)
Informed from lab that patient tested positive for flu A.  MD made aware; will continue to monitor.  Vivi Martens RN

## 2013-06-09 DIAGNOSIS — J111 Influenza due to unidentified influenza virus with other respiratory manifestations: Secondary | ICD-10-CM

## 2013-06-09 DIAGNOSIS — J101 Influenza due to other identified influenza virus with other respiratory manifestations: Secondary | ICD-10-CM | POA: Diagnosis present

## 2013-06-09 MED ORDER — LEVALBUTEROL HCL 0.63 MG/3ML IN NEBU
0.6300 mg | INHALATION_SOLUTION | Freq: Three times a day (TID) | RESPIRATORY_TRACT | Status: DC
  Administered 2013-06-09: 0.63 mg via RESPIRATORY_TRACT
  Filled 2013-06-09 (×5): qty 3

## 2013-06-09 MED ORDER — PREDNISONE 10 MG PO TABS: ORAL_TABLET | ORAL | Status: DC

## 2013-06-09 MED ORDER — OSELTAMIVIR PHOSPHATE 75 MG PO CAPS: 75.0000 mg | ORAL_CAPSULE | Freq: Two times a day (BID) | ORAL | Status: DC

## 2013-06-09 NOTE — Progress Notes (Signed)
06/09/13 Patient to be discharged home. IV site removed, Discharge instructions reviewed with patient.

## 2013-06-09 NOTE — ED Provider Notes (Signed)
I saw and evaluated the patient, reviewed the resident's note and I agree with the findings and plan.  EKG Interpretation    Date/Time:  Friday June 07 2013 19:06:16 EST Ventricular Rate:  153 PR Interval:  78 QRS Duration: 68 QT Interval:  283 QTC Calculation: 451 R Axis:   71 Text Interpretation:  Age not entered, assumed to be  22 years old for purpose of ECG interpretation Sinus tachycardia Nonspecific repol abnormality, diffuse leads compared to prior, rate is increased and nonspecific ST/T changes now present.   Confirmed by [facility]  MD, TREY [phone]) on 06/07/2013 7:11:39 PM            22 yo female w hx of asthma presenting with flu like symptoms and SOB.  On exam, uncomfortable, tachypneic, tachycardic, wheezing and poor air movement in all lung fields.  Some improvement with albuterol treatments, but required admission for further respiratory treatment and monitoring.   Clinical Impression: 1. Status asthmaticus   2. Nausea with vomiting   3. Wheezing      Candyce Churn, MD 06/09/13 1044

## 2013-06-09 NOTE — Progress Notes (Signed)
Resp Care Note;Pt not wanting to wake up at night for tx,pt assessment done,BBS clear,cxry clear no O2 required,changed tx frequency to TID,to allow pt to sleep at night.

## 2013-06-09 NOTE — Discharge Summary (Signed)
Physician Discharge Summary  Andrea Daniels ZOX:[id number] DOB: Dec 24, 1990 DOA: 06/07/2013  PCP: Oliver Barre, MD  Admit date: 06/07/2013 Discharge date: 06/09/2013  Time spent: 40 minutes  Recommendations for Outpatient Follow-up:  1. Followup with primary care physician within one week  Discharge Diagnoses:  Principal Problem:   Status asthmaticus Active Problems:   Influenza A with respiratory manifestations   Discharge Condition: Stable  Diet recommendation: Regular  Filed Weights   06/08/13 0227 06/08/13 1813  Weight: 64 kg (141 lb 1.5 oz) 58.695 kg (129 lb 6.4 oz)    History of present illness:  22 year old African American female with past medical history of asthma, came in to the hospital with cough and wheezing for one day prior to admission. Patient had extensive wheezes and admitted to step down for further evaluation.  Hospital Course:   1. Acute exacerbation of asthma: Status  Asthmaticus, patient presents with severe wheezing and shortness of breath, admitted to the step down level initially. Started on high dose of steroids and oxygen as well as bronchodilators. Her asthma exacerbation was secondary to influenza type A. upper respiratory tract infection. Patient started on Tamiflu above in addition and to continue that for total of 5 days. On discharge patient will be on albuterol and prednisone taper.  2. Influenza type A infection: Likely this would cause of her acute asthma exacerbation, patient started on Tamiflu and she is afebrile today, advised to keep herself hydrated and to followup with her primary care physician within one week.  3. Chronic abdominal pain: Patient has frequent ED visits because of his complaints, no issues at this admission. Patient to followup with primary care physician.  4. Tobacco abuse: Counseled extensively about smoking cessation.  Procedures:  None  Consultations:  None  Discharge Exam: Filed Vitals:   06/09/13 0549   BP: 140/95  Pulse: 84  Temp: 98 F (36.7 C)  Resp: 20   General: Alert and awake, oriented x3, not in any acute distress. HEENT: anicteric sclera, pupils reactive to light and accommodation, EOMI CVS: S1-S2 clear, no murmur rubs or gallops Chest: clear to auscultation bilaterally, no wheezing, rales or rhonchi Abdomen: soft nontender, nondistended, normal bowel sounds, no organomegaly Extremities: no cyanosis, clubbing or edema noted bilaterally Neuro: Cranial nerves II-XII intact, no focal neurological deficits  Discharge Instructions  Discharge Orders   Future Orders Complete By Expires   Increase activity slowly  As directed        Medication List         albuterol 108 (90 BASE) MCG/ACT inhaler  Commonly known as:  PROVENTIL HFA;VENTOLIN HFA  Inhale 2 puffs into the lungs every 6 (six) hours as needed for wheezing.     ondansetron 4 MG tablet  Commonly known as:  ZOFRAN  Take 1 tablet (4 mg total) by mouth every 8 (eight) hours as needed for nausea or vomiting.     oseltamivir 75 MG capsule  Commonly known as:  TAMIFLU  Take 1 capsule (75 mg total) by mouth 2 (two) times daily.     predniSONE 10 MG tablet  Commonly known as:  DELTASONE  Take 6-5-4-3-2-1 tablets PO daily till gone       No Known Allergies     Follow-up Information   Follow up with Oliver Barre, MD In 1 week.   Specialties:  Internal Medicine, Radiology   Contact information:   [address] [place] [address] [phone]  The results of significant diagnostics from this hospitalization (including imaging, microbiology, ancillary and laboratory) are listed below for reference.    Significant Diagnostic Studies: Dg Chest 2 View  06/07/2013   CLINICAL DATA:  Three day history of cough and chest congestion. Onset of fever and chest pain today. Headache and dizziness.  EXAM: CHEST  2 VIEW  COMPARISON:  08/16/2012.  FINDINGS: Cardiomediastinal silhouette unremarkable. Lungs  clear. Bronchovascular markings normal. Pulmonary vascularity normal. No pneumothorax. No pleural effusions. Visualized bony thorax intact.  IMPRESSION: Normal examination.   Electronically Signed   By: Hulan Saas M.D.   On: 06/07/2013 20:09    Microbiology: Recent Results (from the past 240 hour(s))  MRSA PCR SCREENING     Status: None   Collection Time    06/08/13  2:23 AM      Result Value Range Status   MRSA by PCR NEGATIVE  NEGATIVE Final   Comment:            The GeneXpert MRSA Assay (FDA     approved for NASAL specimens     only), is one component of a     comprehensive MRSA colonization     surveillance program. It is not     intended to diagnose MRSA     infection nor to guide or     monitor treatment for     MRSA infections.     Labs: Basic Metabolic Panel:  Recent Labs Lab 06/07/13 1825  NA 138  K 3.7  CL 103  CO2 22  GLUCOSE 102*  BUN 4*  CREATININE 0.77  CALCIUM 9.2   Liver Function Tests: No results found for this basename: AST, ALT, ALKPHOS, BILITOT, PROT, ALBUMIN,  in the last 168 hours No results found for this basename: LIPASE, AMYLASE,  in the last 168 hours No results found for this basename: AMMONIA,  in the last 168 hours CBC:  Recent Labs Lab 06/07/13 1825  WBC 6.2  HGB 13.7  HCT 39.1  MCV 80.5  PLT 311   Cardiac Enzymes: No results found for this basename: CKTOTAL, CKMB, CKMBINDEX, TROPONINI,  in the last 168 hours BNP: BNP (last 3 results) No results found for this basename: PROBNP,  in the last 8760 hours CBG: No results found for this basename: GLUCAP,  in the last 168 hours     Signed:  Almond Fitzgibbon A  [facility] 06/09/2013, 10:27 AM

## 2013-06-09 NOTE — ED Provider Notes (Signed)
Medical screening examination/treatment/procedure(s) were performed by non-physician practitioner and as supervising physician I was immediately available for consultation/collaboration.  Jatavia Keltner L Raechell Singleton, MD 06/09/13 0043 

## 2013-06-09 NOTE — ED Provider Notes (Signed)
I saw and evaluated the patient, reviewed the resident's note and I agree with the findings and plan.  EKG Interpretation    Date/Time:  Friday June 07 2013 19:06:16 EST Ventricular Rate:  153 PR Interval:  78 QRS Duration: 68 QT Interval:  283 QTC Calculation: 451 R Axis:   71 Text Interpretation:  Age not entered, assumed to be  22 years old for purpose of ECG interpretation Sinus tachycardia Nonspecific repol abnormality, diffuse leads compared to prior, rate is increased and nonspecific ST/T changes now present.   Confirmed by [facility]  MD, TREY [phone]) on 06/07/2013 7:11:39 PM              Candyce Churn, MD 06/09/13 1044

## 2013-06-11 NOTE — Progress Notes (Signed)
UR completed. Russ Looper RN CCM Case Mgmt 

## 2013-07-11 ENCOUNTER — Ambulatory Visit (INDEPENDENT_AMBULATORY_CARE_PROVIDER_SITE_OTHER): Payer: 59 | Admitting: Internal Medicine

## 2013-07-11 ENCOUNTER — Encounter: Payer: Self-pay | Admitting: Internal Medicine

## 2013-07-11 VITALS — BP 106/70 | HR 78 | Temp 99.0°F | Ht 59.0 in | Wt 144.5 lb

## 2013-07-11 DIAGNOSIS — J45902 Unspecified asthma with status asthmaticus: Secondary | ICD-10-CM

## 2013-07-11 DIAGNOSIS — J309 Allergic rhinitis, unspecified: Secondary | ICD-10-CM

## 2013-07-11 DIAGNOSIS — L509 Urticaria, unspecified: Secondary | ICD-10-CM

## 2013-07-11 MED ORDER — METHYLPREDNISOLONE ACETATE 80 MG/ML IJ SUSP
80.0000 mg | Freq: Once | INTRAMUSCULAR | Status: AC
  Administered 2013-07-11: 80 mg via INTRAMUSCULAR

## 2013-07-11 MED ORDER — PREDNISONE 10 MG PO TABS: ORAL_TABLET | ORAL | Status: DC

## 2013-07-11 NOTE — Patient Instructions (Signed)
You had the steroid shot today Please take all new medication as prescribed - the prednisone You can also consider taking OTC Pepcid AC which can help with the rash, as well as benadryl OTC (pills at 50 mg every 6 hrs as needed) or the cream Please continue all other medications as before Please have the pharmacy call with any other refills you may need.  If not better or the rash comes right back in 1-2 wks, you may need to consider seeing an allergist

## 2013-07-12 ENCOUNTER — Telehealth: Payer: Self-pay | Admitting: Internal Medicine

## 2013-07-12 NOTE — Telephone Encounter (Signed)
Relevant patient education assigned to patient using Emmi. ° °

## 2013-07-13 NOTE — Assessment & Plan Note (Signed)
stable overall by history and exam, and pt to continue medical treatment as before,  to f/u any worsening symptoms or concerns 

## 2013-07-13 NOTE — Assessment & Plan Note (Signed)
Etiology unclear, for benadryl otc po prn, as well as depomedrol IM, predpack asd

## 2013-07-13 NOTE — Progress Notes (Signed)
   Subjective:    Patient ID: Andrea Daniels, female    DOB: 06/20/1991, 23 y.o.   MRN: [id number]  HPI  Here after having flu shot appro 3 wks ago.  Had onset hive like rash x 6 days with marked itching, but no angioedema like edema, lip or tongue swelling, no sob, wheezing.  Pt denies chest pain, increased sob or doe, wheezing, orthopnea, PND, increased LE swelling, palpitations, dizziness or syncope   Pt denies polydipsia, polyuria.   Pt denies fever, wt loss, night sweats, loss of appetite, or other constitutional symptoms Past Medical History  Diagnosis Date  . UTI (lower urinary tract infection)   . Asthma    No past surgical history on file.  reports that she has been smoking.  She has never used smokeless tobacco. She reports that she drinks alcohol. She reports that she does not use illicit drugs. family history includes Arthritis in her other; Cancer in her other; Diabetes in her other; Hyperlipidemia in her other; Hypertension in her other and other. No Known Allergies Current Outpatient Prescriptions on File Prior to Visit  Medication Sig Dispense Refill  . albuterol (PROVENTIL HFA;VENTOLIN HFA) 108 (90 BASE) MCG/ACT inhaler Inhale 2 puffs into the lungs every 6 (six) hours as needed for wheezing.  1 Inhaler  2  . oseltamivir (TAMIFLU) 75 MG capsule Take 1 capsule (75 mg total) by mouth 2 (two) times daily.  10 capsule  0   No current facility-administered medications on file prior to visit.   Review of Systems  Constitutional: Negative for unexpected weight change, or unusual diaphoresis  HENT: Negative for tinnitus.   Eyes: Negative for photophobia and visual disturbance.  Respiratory: Negative for choking and stridor.   Gastrointestinal: Negative for vomiting and blood in stool.  Genitourinary: Negative for hematuria and decreased urine volume.  Musculoskeletal: Negative for acute joint swelling Skin: Negative for color change and wound.  Neurological: Negative for tremors  and numbness other than noted  Psychiatric/Behavioral: Negative for decreased concentration or  hyperactivity.       Objective:   Physical Exam BP 106/70  Pulse 78  Temp(Src) 99 F (37.2 C) (Oral)  Ht 4\' 11"  (1.499 m)  Wt 144 lb 8 oz (65.545 kg)  BMI 29.17 kg/m2  SpO2 97% VS noted,  Constitutional: Pt appears well-developed and well-nourished.  HENT: Head: NCAT.  Right Ear: External ear normal.  Left Ear: External ear normal.  Eyes: Conjunctivae and EOM are normal. Pupils are equal, round, and reactive to light.  Neck: Normal range of motion. Neck supple.  Cardiovascular: Normal rate and regular rhythm.   Pulmonary/Chest: Effort normal and breath sounds normal.  - no rales or wheezing Neurological: Pt is alert. Not confused  Skin: with diffuse hive like rash to torso and extrmities Psychiatric: Pt behavior is normal. Thought content normal.     Assessment & Plan:

## 2013-08-06 ENCOUNTER — Encounter (HOSPITAL_COMMUNITY): Payer: Self-pay | Admitting: Emergency Medicine

## 2013-08-06 ENCOUNTER — Emergency Department (HOSPITAL_COMMUNITY)
Admission: EM | Admit: 2013-08-06 | Discharge: 2013-08-06 | Disposition: A | Discharge status: Home / Self Care | Payer: 59 | Attending: Emergency Medicine | Admitting: Emergency Medicine

## 2013-08-06 ENCOUNTER — Emergency Department (HOSPITAL_COMMUNITY): Payer: 59

## 2013-08-06 DIAGNOSIS — J45901 Unspecified asthma with (acute) exacerbation: Secondary | ICD-10-CM | POA: Insufficient documentation

## 2013-08-06 DIAGNOSIS — Z87440 Personal history of urinary (tract) infections: Secondary | ICD-10-CM | POA: Insufficient documentation

## 2013-08-06 DIAGNOSIS — K529 Noninfective gastroenteritis and colitis, unspecified: Secondary | ICD-10-CM

## 2013-08-06 DIAGNOSIS — Z79899 Other long term (current) drug therapy: Secondary | ICD-10-CM | POA: Insufficient documentation

## 2013-08-06 DIAGNOSIS — F17200 Nicotine dependence, unspecified, uncomplicated: Secondary | ICD-10-CM | POA: Insufficient documentation

## 2013-08-06 DIAGNOSIS — K5289 Other specified noninfective gastroenteritis and colitis: Secondary | ICD-10-CM | POA: Insufficient documentation

## 2013-08-06 DIAGNOSIS — R059 Cough, unspecified: Secondary | ICD-10-CM

## 2013-08-06 DIAGNOSIS — R05 Cough: Secondary | ICD-10-CM

## 2013-08-06 DIAGNOSIS — Z3202 Encounter for pregnancy test, result negative: Secondary | ICD-10-CM | POA: Insufficient documentation

## 2013-08-06 LAB — COMPREHENSIVE METABOLIC PANEL
ALT: 43 U/L — ABNORMAL HIGH (ref 0–35)
AST: 40 U/L — ABNORMAL HIGH (ref 0–37)
Albumin: 4 g/dL (ref 3.5–5.2)
Alkaline Phosphatase: 54 U/L (ref 39–117)
BUN: 7 mg/dL (ref 6–23)
CO2: 20 mEq/L (ref 19–32)
Calcium: 8.8 mg/dL (ref 8.4–10.5)
Chloride: 106 mEq/L (ref 96–112)
Creatinine, Ser: 0.71 mg/dL (ref 0.50–1.10)
GFR calc Af Amer: >90 mL/min (ref >90)
GFR calc non Af Amer: >90 mL/min (ref >90)
Glucose, Bld: 98 mg/dL (ref 70–99)
Potassium: 3.5 mEq/L — ABNORMAL LOW (ref 3.7–5.3)
Sodium: 141 mEq/L (ref 137–147)
Total Bilirubin: 0.5 mg/dL (ref 0.3–1.2)
Total Protein: 8.2 g/dL (ref 6.0–8.3)

## 2013-08-06 LAB — CBC WITH DIFFERENTIAL/PLATELET
Basophils Absolute: 0.1 10*3/uL (ref 0.0–0.1)
Basophils Relative: 1 % (ref 0–1)
Eosinophils Absolute: 0.7 10*3/uL (ref 0.0–0.7)
Eosinophils Relative: 12 % — ABNORMAL HIGH (ref 0–5)
HCT: 36.5 % (ref 36.0–46.0)
Hemoglobin: 12.7 g/dL (ref 12.0–15.0)
Lymphocytes Relative: 29 % (ref 12–46)
Lymphs Abs: 1.7 10*3/uL (ref 0.7–4.0)
MCH: 27.7 pg (ref 26.0–34.0)
MCHC: 34.8 g/dL (ref 30.0–36.0)
MCV: 79.7 fL (ref 78.0–100.0)
Monocytes Absolute: 0.3 10*3/uL (ref 0.1–1.0)
Monocytes Relative: 5 % (ref 3–12)
Neutro Abs: 3.1 10*3/uL (ref 1.7–7.7)
Neutrophils Relative %: 53 % (ref 43–77)
Platelets: 342 10*3/uL (ref 150–400)
RBC: 4.58 MIL/uL (ref 3.87–5.11)
RDW: 13.8 % (ref 11.5–15.5)
WBC: 5.8 10*3/uL (ref 4.0–10.5)

## 2013-08-06 LAB — URINALYSIS, ROUTINE W REFLEX MICROSCOPIC
Bilirubin Urine: NEGATIVE
Glucose, UA: NEGATIVE mg/dL
Ketones, ur: NEGATIVE mg/dL
Leukocytes, UA: NEGATIVE
Nitrite: NEGATIVE
Protein, ur: NEGATIVE mg/dL
Specific Gravity, Urine: 1.035 — ABNORMAL HIGH (ref 1.005–1.030)
Urobilinogen, UA: 0.2 mg/dL (ref 0.0–1.0)
pH: 6 (ref 5.0–8.0)

## 2013-08-06 LAB — URINE MICROSCOPIC-ADD ON

## 2013-08-06 LAB — PREGNANCY, URINE: Preg Test, Ur: NEGATIVE

## 2013-08-06 LAB — LIPASE, BLOOD: Lipase: 27 U/L (ref 11–59)

## 2013-08-06 MED ORDER — ALBUTEROL SULFATE HFA 108 (90 BASE) MCG/ACT IN AERS
2.0000 | INHALATION_SPRAY | Freq: Once | RESPIRATORY_TRACT | Status: AC
  Administered 2013-08-06: 2 via RESPIRATORY_TRACT
  Filled 2013-08-06: qty 6.7

## 2013-08-06 MED ORDER — ONDANSETRON HCL 4 MG/2ML IJ SOLN: 4.0000 mg | Freq: Once | INTRAMUSCULAR | Status: DC

## 2013-08-06 MED ORDER — ONDANSETRON 4 MG PO TBDP
4.0000 mg | ORAL_TABLET | Freq: Once | ORAL | Status: AC
  Administered 2013-08-06: 4 mg via ORAL
  Filled 2013-08-06: qty 1

## 2013-08-06 MED ORDER — IPRATROPIUM-ALBUTEROL 0.5-2.5 (3) MG/3ML IN SOLN
3.0000 mL | Freq: Once | RESPIRATORY_TRACT | Status: AC
  Administered 2013-08-06: 3 mL via RESPIRATORY_TRACT
  Filled 2013-08-06: qty 3

## 2013-08-06 NOTE — ED Notes (Signed)
To ED from home via [facility], vomiting since last night, diarrhea today, no fever, A/O X4, ambulatory, VSS

## 2013-08-06 NOTE — ED Provider Notes (Signed)
CSN: [id number]     Arrival date & time 08/06/13  1417 History   First MD Initiated Contact with Patient 08/06/13 1717     Chief Complaint  Patient presents with  . Emesis    HPI  Andrea Daniels is a 23 y.o. female with a PMH of UTI and asthma who presents to the ED for evaluation of emesis.  History was provided by the patient. Patient states she had had 2-3 episodes of emesis last night and this morning. Emesis is described as food-like and yellow in color. No hematemesis. She also had 2-3 episodes of watery-brown diarrhea today with no hematochezia. She states she continues to have nausea. She had diffuse abdominal cramping with diarrhea and vomiting but this has since resolved. No back pain, dysuria, hematuria, vaginal bleeding/discharge. Her LNMP ended last night. She denies any recent travel. No close contacts with similar symptoms. She also complains of a chronic cough since December when she was hospitalized for Influenza. She states her cough has not worsened or improved since discharge. She has a hx of asthma has been having intermittent wheezing which improves with her inhaler. Cough is non-productive. No chest pain, SOB, or dyspnea. No fevers, chills, change in appetite/activity, rhinorrhea, congestion, sore throat, headaches, dizziness or lightheadedness.    Past Medical History  Diagnosis Date  . UTI (lower urinary tract infection)   . Asthma    History reviewed. No pertinent past surgical history. Family History  Problem Relation Age of Onset  . Hyperlipidemia Other   . Hypertension Other   . Arthritis Other   . Cancer Other     breast cancer  . Hypertension Other   . Diabetes Other    History  Substance Use Topics  . Smoking status: Current Every Day Smoker  . Smokeless tobacco: Never Used  . Alcohol Use: Yes     Comment: 1-5 cans a week   OB History   Grav Para Term Preterm Abortions TAB SAB Ect Mult Living                 Review of Systems  Constitutional:  Negative for fever, chills, diaphoresis, activity change, appetite change and fatigue.  HENT: Negative for congestion, ear pain, rhinorrhea and sore throat.   Respiratory: Positive for cough and wheezing. Negative for chest tightness and shortness of breath.   Cardiovascular: Negative for chest pain and leg swelling.  Gastrointestinal: Positive for nausea, vomiting, abdominal pain (resolved) and diarrhea. Negative for constipation, blood in stool and rectal pain.  Genitourinary: Negative for dysuria, hematuria, vaginal bleeding, vaginal discharge, difficulty urinating, vaginal pain and menstrual problem.  Musculoskeletal: Negative for back pain and myalgias.  Neurological: Negative for dizziness, weakness, light-headedness and headaches.    Allergies  Review of patient's allergies indicates no known allergies.  Home Medications   Current Outpatient Rx  Name  Route  Sig  Dispense  Refill  . acetaminophen (TYLENOL) 325 MG tablet   Oral   Take 650 mg by mouth every 6 (six) hours as needed for mild pain.         Marland Kitchen albuterol (PROVENTIL HFA;VENTOLIN HFA) 108 (90 BASE) MCG/ACT inhaler   Inhalation   Inhale 2 puffs into the lungs every 6 (six) hours as needed for wheezing.   1 Inhaler   2   . dicyclomine (BENTYL) 10 MG capsule   Oral   Take 30 mg by mouth daily as needed for spasms.         Marland Kitchen  diphenhydrAMINE (BENADRYL) 25 MG tablet   Oral   Take 25 mg by mouth every 6 (six) hours as needed for itching or allergies.         . Pseudoeph-Doxylamine-DM-APAP (NYQUIL PO)   Oral   Take 2 tablets by mouth at bedtime as needed (cold).         . Pseudoephedrine-APAP-DM (DAYQUIL PO)   Oral   Take 2 tablets by mouth every 6 (six) hours as needed (cold).          BP 157/103  Pulse 77  Temp(Src) 97.1 F (36.2 C) (Oral)  Resp 18  SpO2 100%  Filed Vitals:   08/06/13 1425 08/06/13 1846  BP: 157/103 144/90  Pulse: 77 77  Temp: 97.1 F (36.2 C)   TempSrc: Oral   Resp: 18 15   SpO2: 100% 100%    Physical Exam  Nursing note and vitals reviewed. Constitutional: She is oriented to person, place, and time. She appears well-developed and well-nourished. No distress.  HENT:  Head: Normocephalic and atraumatic.  Right Ear: External ear normal.  Left Ear: External ear normal.  Nose: Nose normal.  Mouth/Throat: Oropharynx is clear and moist. No oropharyngeal exudate.  Eyes: Conjunctivae are normal. Right eye exhibits no discharge. Left eye exhibits no discharge.  Neck: Normal range of motion. Neck supple.  Cardiovascular: Normal rate, regular rhythm, normal heart sounds and intact distal pulses.  Exam reveals no gallop and no friction rub.   No murmur heard. Pulmonary/Chest: Effort normal. No respiratory distress. She has wheezes. She has no rales. She exhibits no tenderness.  Mild intermittent inspiratory and expiratory wheezing throughout.   Abdominal: Soft. Bowel sounds are normal. She exhibits no distension and no mass. There is no tenderness. There is no rebound and no guarding.  Musculoskeletal: Normal range of motion. She exhibits no edema and no tenderness.  No LE edema or calf tenderness bilaterally. No tenderness to palpation to the thoracic or lumbar spinous processes throughout.  No tenderness to palpation to the paraspinal muscles throughout.    Neurological: She is alert and oriented to person, place, and time.  Skin: Skin is warm and dry. She is not diaphoretic.    ED Course  Procedures (including critical care time) Labs Review Labs Reviewed  CBC WITH DIFFERENTIAL - Abnormal; Notable for the following:    Eosinophils Relative 12 (*)    All other components within normal limits  COMPREHENSIVE METABOLIC PANEL - Abnormal; Notable for the following:    Potassium 3.5 (*)    AST 40 (*)    ALT 43 (*)    All other components within normal limits  URINALYSIS, ROUTINE W REFLEX MICROSCOPIC - Abnormal; Notable for the following:    Specific Gravity,  Urine 1.035 (*)    Hgb urine dipstick LARGE (*)    All other components within normal limits  PREGNANCY, URINE  LIPASE, BLOOD  URINE MICROSCOPIC-ADD ON   Imaging Review No results found.  EKG Interpretation   None      Results for orders placed during the hospital encounter of 08/06/13  CBC WITH DIFFERENTIAL      Result Value Ref Range   WBC 5.8  4.0 - 10.5 K/uL   RBC 4.58  3.87 - 5.11 MIL/uL   Hemoglobin 12.7  12.0 - 15.0 g/dL   HCT 95.636.5  38.736.0 - 56.446.0 %   MCV 79.7  78.0 - 100.0 fL   MCH 27.7  26.0 - 34.0 pg   MCHC 34.8  30.0 - 36.0 g/dL   RDW 96.0  45.4 - 09.8 %   Platelets 342  150 - 400 K/uL   Neutrophils Relative % 53  43 - 77 %   Neutro Abs 3.1  1.7 - 7.7 K/uL   Lymphocytes Relative 29  12 - 46 %   Lymphs Abs 1.7  0.7 - 4.0 K/uL   Monocytes Relative 5  3 - 12 %   Monocytes Absolute 0.3  0.1 - 1.0 K/uL   Eosinophils Relative 12 (*) 0 - 5 %   Eosinophils Absolute 0.7  0.0 - 0.7 K/uL   Basophils Relative 1  0 - 1 %   Basophils Absolute 0.1  0.0 - 0.1 K/uL  COMPREHENSIVE METABOLIC PANEL      Result Value Ref Range   Sodium 141  137 - 147 mEq/L   Potassium 3.5 (*) 3.7 - 5.3 mEq/L   Chloride 106  96 - 112 mEq/L   CO2 20  19 - 32 mEq/L   Glucose, Bld 98  70 - 99 mg/dL   BUN 7  6 - 23 mg/dL   Creatinine, Ser 1.19  0.50 - 1.10 mg/dL   Calcium 8.8  8.4 - 14.7 mg/dL   Total Protein 8.2  6.0 - 8.3 g/dL   Albumin 4.0  3.5 - 5.2 g/dL   AST 40 (*) 0 - 37 U/L   ALT 43 (*) 0 - 35 U/L   Alkaline Phosphatase 54  39 - 117 U/L   Total Bilirubin 0.5  0.3 - 1.2 mg/dL   GFR calc non Af Amer >90  >90 mL/min   GFR calc Af Amer >90  >90 mL/min  PREGNANCY, URINE      Result Value Ref Range   Preg Test, Ur NEGATIVE  NEGATIVE  URINALYSIS, ROUTINE W REFLEX MICROSCOPIC      Result Value Ref Range   Color, Urine YELLOW  YELLOW   APPearance CLEAR  CLEAR   Specific Gravity, Urine 1.035 (*) 1.005 - 1.030   pH 6.0  5.0 - 8.0   Glucose, UA NEGATIVE  NEGATIVE mg/dL   Hgb urine dipstick  LARGE (*) NEGATIVE   Bilirubin Urine NEGATIVE  NEGATIVE   Ketones, ur NEGATIVE  NEGATIVE mg/dL   Protein, ur NEGATIVE  NEGATIVE mg/dL   Urobilinogen, UA 0.2  0.0 - 1.0 mg/dL   Nitrite NEGATIVE  NEGATIVE   Leukocytes, UA NEGATIVE  NEGATIVE  LIPASE, BLOOD      Result Value Ref Range   Lipase 27  11 - 59 U/L  URINE MICROSCOPIC-ADD ON      Result Value Ref Range   Squamous Epithelial / LPF RARE  RARE   RBC / HPF 7-10  <3 RBC/hpf   Bacteria, UA RARE  RARE   Urine-Other AMORPHOUS URATES/PHOSPHATES      DG Chest 2 View (Final result)  Result time: 08/06/13 18:08:24    Final result by Rad Results In Interface (08/06/13 82:95:62)    Narrative:   CLINICAL DATA: Chest pain, cough  EXAM: CHEST 2 VIEW  COMPARISON: None prior radiograph from 06/07/2013  FINDINGS: The cardiac and mediastinal silhouettes are stable in size and contour, and remain within normal limits.  The lungs are normally inflated. No airspace consolidation, pleural effusion, or pulmonary edema is identified. There is no pneumothorax.  No acute osseous abnormality identified.  IMPRESSION: No active cardiopulmonary disease.   Electronically Signed By: Rise Mu M.D. On: 08/06/2013 18:08  MDM   Andrea Daniels is a 23 y.o. female with a PMH of UTI and asthma who presents to the ED for evaluation of emesis.    Rechecks  7:05 PM = Patient states she feels better. No nausea. Drinking water without difficulty. Patient states breathing improved. No SOB or dyspnea. Lungs clear to auscultation.    Etiology of nausea, emesis, and diarrhea possibly due to gastroenteritis. Abdominal exam benign. Patient able to tolerate PO liquids in the ED. Labs showed mild elevated liver enzymes. Patient instructed to follow-up regarding this (AST 40 and ALT 43). Labs otherwise unremarkable. No evidence of UTI on UA. Patient also complained of a chronic cough, which may be due to asthma/bronchospasm vs bronchitis.  Chest x-ray negative for an acute cardiopulmonary process. Patient's wheezing and symptoms resolved with 1 Duoneb. Did not feel prednisone was necessary at this time. No hypoxia or respiratory distress. Patient instructed to follow-up with her PCP, drink plenty of fluids and rest. Return precautions, discharge instructions, and follow-up was discussed with the patient before discharge.     Discharge Medication List as of 08/06/2013  7:08 PM      Final impressions: 1. Gastroenteritis   2. Cough       Greer Ee Marks Scalera PA-C          Jillyn Ledger, PA-C 08/07/13 1013

## 2013-08-06 NOTE — Discharge Instructions (Signed)
Continue to use albuterol inhaler. 2 puffs every 4-6 hours  Drink a clear liquid diet for the next 24-48 hours - see below  Return to the emergency department if you develop any changing/worsening condition, abdominal pain, repeated vomiting, blood in your stool/vomit, feeling like you are going to faint, or any other concerns (please read additional information regarding your condition below)   Cough, Adult  A cough is a reflex that helps clear your throat and airways. It can help heal the body or may be a reaction to an irritated airway. A cough may only last 2 or 3 weeks (acute) or may last more than 8 weeks (chronic).  CAUSES Acute cough:  Viral or bacterial infections. Chronic cough:  Infections.  Allergies.  Asthma.  Post-nasal drip.  Smoking.  Heartburn or acid reflux.  Some medicines.  Chronic lung problems (COPD).  Cancer. SYMPTOMS   Cough.  Fever.  Chest pain.  Increased breathing rate.  High-pitched whistling sound when breathing (wheezing).  Colored mucus that you cough up (sputum). TREATMENT   A bacterial cough may be treated with antibiotic medicine.  A viral cough must run its course and will not respond to antibiotics.  Your caregiver may recommend other treatments if you have a chronic cough. HOME CARE INSTRUCTIONS   Only take over-the-counter or prescription medicines for pain, discomfort, or fever as directed by your caregiver. Use cough suppressants only as directed by your caregiver.  Use a cold steam vaporizer or humidifier in your bedroom or home to help loosen secretions.  Sleep in a semi-upright position if your cough is worse at night.  Rest as needed.  Stop smoking if you smoke. SEEK IMMEDIATE MEDICAL CARE IF:   You have pus in your sputum.  Your cough starts to worsen.  You cannot control your cough with suppressants and are losing sleep.  You begin coughing up blood.  You have difficulty breathing.  You develop pain  which is getting worse or is uncontrolled with medicine.  You have a fever. MAKE SURE YOU:   Understand these instructions.  Will watch your condition.  Will get help right away if you are not doing well or get worse. Document Released: 12/10/2010 Document Revised: 09/05/2011 Document Reviewed: 12/10/2010 [facility] Patient Information 2014 [place], [place].  Diet The clear liquid diet consists of foods that are liquid or will become liquid at room temperature. Examples of foods allowed on a clear liquid diet include fruit juice, broth or bouillon, gelatin, or frozen ice pops. You should be able to see through the liquid. The purpose of this diet is to provide the necessary fluids, electrolytes (such as sodium and potassium), and energy to keep the body functioning during times when you are not able to consume a regular diet. A clear liquid diet should not be continued for long periods of time, as it is not nutritionally adequate.  A CLEAR LIQUID DIET MAY BE NEEDED:  When a sudden-onset (acute) condition occurs before or after surgery.   As the first step in oral feeding.   For fluid and electrolyte replacement in diarrheal diseases.   As a diet before certain medical tests are performed.  ADEQUACY The clear liquid diet is adequate only in ascorbic acid, according to the Recommended Dietary Allowances of the [employer].  CHOOSING FOODS Breads and Starches  Allowed: None are allowed.   Avoid: All are to be avoided.  Vegetables  Allowed: Strained vegetable juices.   Avoid: Any others.  Fruit  Allowed: Strained fruit juices and fruit drinks. Include 1 serving of citrus or vitamin C-enriched fruit juice daily.   Avoid: Any others.  Meat and Meat Substitutes  Allowed: None are allowed.   Avoid: All are to be avoided.  Milk Products  Allowed: None are allowed.   Avoid: All are to be avoided.  Soups and Combination  Foods  Allowed: Clear bouillon, broth, or strained broth-based soups.   Avoid: Any others.  Desserts and Sweets  Allowed: Sugar, honey. High-protein gelatin. Flavored gelatin, ices, or frozen ice pops that do not contain milk.   Avoid: Any others.  Fats and Oils  Allowed: None are allowed.   Avoid: All are to be avoided.  Beverages  Allowed: Cereal beverages, coffee (regular or decaffeinated), tea, or soda at the discretion of your health care provider.   Avoid: Any others.  Condiments  Allowed: Salt.   Avoid: Any others, including pepper.  Supplements  Allowed: Liquid nutrition beverages that you can see through.   Avoid: Any others that contain lactose or fiber. SAMPLE MEAL PLAN Breakfast  4 oz (120 mL) strained orange juice.   to 1 cup (120 to 240 mL) gelatin (plain or fortified).  1 cup (240 mL) beverage (coffee or tea).  Sugar, if desired. Midmorning Snack   cup (120 mL) gelatin (plain or fortified). Lunch  1 cup (240 mL) broth or consomm.  4 oz (120 mL) strained grapefruit juice.   cup (120 mL) gelatin (plain or fortified).  1 cup (240 mL) beverage (coffee or tea).  Sugar, if desired. Midafternoon Snack   cup (120 mL) fruit ice.   cup (120 mL) strained fruit juice. Dinner  1 cup (240 mL) broth or consomm.   cup (120 mL) cranberry juice.   cup (120 mL) flavored gelatin (plain or fortified).  1 cup (240 mL) beverage (coffee or tea).  Sugar, if desired. Evening Snack  4 oz (120 mL) strained apple juice (vitamin C-fortified).   cup (120 mL) flavored gelatin (plain or fortified). MAKE SURE YOU:  Understand these instructions.  Will watch your child's condition.  Will get help right away if your child is not doing well or gets worse. Document Released: 06/13/2005 Document Revised: 02/13/2013 Document Reviewed: 11/13/2012 [facility] Patient Information 2014 [place], [place].  Viral  Gastroenteritis Viral gastroenteritis is also known as stomach flu. This condition affects the stomach and intestinal tract. It can cause sudden diarrhea and vomiting. The illness typically lasts 3 to 8 days. Most people develop an immune response that eventually gets rid of the virus. While this natural response develops, the virus can make you quite ill. CAUSES  Many different viruses can cause gastroenteritis, such as rotavirus or noroviruses. You can catch one of these viruses by consuming contaminated food or water. You may also catch a virus by sharing utensils or other personal items with an infected person or by touching a contaminated surface. SYMPTOMS  The most common symptoms are diarrhea and vomiting. These problems can cause a severe loss of body fluids (dehydration) and a body salt (electrolyte) imbalance. Other symptoms may include:  Fever.  Headache.  Fatigue.  Abdominal pain. DIAGNOSIS  Your caregiver can usually diagnose viral gastroenteritis based on your symptoms and a physical exam. A stool sample may also be taken to test for the presence of viruses or other infections. TREATMENT  This illness typically goes away on its own. Treatments are aimed at rehydration. The most serious cases of viral gastroenteritis involve vomiting so severely  that you are not able to keep fluids down. In these cases, fluids must be given through an intravenous line (IV). HOME CARE INSTRUCTIONS   Drink enough fluids to keep your urine clear or pale yellow. Drink small amounts of fluids frequently and increase the amounts as tolerated.  Ask your caregiver for specific rehydration instructions.  Avoid:  Foods high in sugar.  Alcohol.  Carbonated drinks.  Tobacco.  Juice.  Caffeine drinks.  Extremely hot or cold fluids.  Fatty, greasy foods.  Too much intake of anything at one time.  Dairy products until 24 to 48 hours after diarrhea stops.  You may consume probiotics.  Probiotics are active cultures of beneficial bacteria. They may lessen the amount and number of diarrheal stools in adults. Probiotics can be found in yogurt with active cultures and in supplements.  Wash your hands well to avoid spreading the virus.  Only take over-the-counter or prescription medicines for pain, discomfort, or fever as directed by your caregiver. Do not give aspirin to children. Antidiarrheal medicines are not recommended.  Ask your caregiver if you should continue to take your regular prescribed and over-the-counter medicines.  Keep all follow-up appointments as directed by your caregiver. SEEK IMMEDIATE MEDICAL CARE IF:   You are unable to keep fluids down.  You do not urinate at least once every 6 to 8 hours.  You develop shortness of breath.  You notice blood in your stool or vomit. This may look like coffee grounds.  You have abdominal pain that increases or is concentrated in one small area (localized).  You have persistent vomiting or diarrhea.  You have a fever.  The patient is a child younger than 3 months, and he or she has a fever.  The patient is a child older than 3 months, and he or she has a fever and persistent symptoms.  The patient is a child older than 3 months, and he or she has a fever and symptoms suddenly get worse.  The patient is a baby, and he or she has no tears when crying. MAKE SURE YOU:   Understand these instructions.  Will watch your condition.  Will get help right away if you are not doing well or get worse. Document Released: 06/13/2005 Document Revised: 09/05/2011 Document Reviewed: 03/30/2011 [facility] Patient Information 2014 [place], [place].   Emergency Department Resource Guide 1) Find a Doctor and Pay Out of Pocket Although you won't have to find out who is covered by your insurance plan, it is a good idea to ask around and get recommendations. You will then need to call the office and see if the doctor you have  chosen will accept you as a new patient and what types of options they offer for patients who are self-pay. Some doctors offer discounts or will set up payment plans for their patients who do not have insurance, but you will need to ask so you aren't surprised when you get to your appointment.  2) Contact Your Local Health Department Not all health departments have doctors that can see patients for sick visits, but many do, so it is worth a call to see if yours does. If you don't know where your local health department is, you can check in your phone book. The CDC also has a tool to help you locate your state's health department, and many state websites also have listings of all of their local health departments.  3) Find a Walk-in Clinic If your illness is not likely  to be very severe or complicated, you may want to try a walk in clinic. These are popping up all over the country in pharmacies, drugstores, and shopping centers. They're usually staffed by nurse practitioners or physician assistants that have been trained to treat common illnesses and complaints. They're usually fairly quick and inexpensive. However, if you have serious medical issues or chronic medical problems, these are probably not your best option.  No Primary Care Doctor: - Call Health Connect at  [phone] - they can help you locate a primary care doctor that  accepts your insurance, provides certain services, etc. - Physician Referral Service- [phone]  Chronic Pain Problems: Organization         Address  Phone   Notes  [facility]  [phone] Patients need to be referred by their primary care doctor.   Medication Assistance: Organization         Address  Phone   Notes  [facility] Medication [facility] [address] [place] [place], [place] [address] [phone] --Must be a resident of [facility] -- Must have NO insurance coverage whatsoever (no Medicaid/ Medicare,  etc.) -- The pt. MUST have a primary care doctor that directs their care regularly and follows them in the community   [facility]  [phone]   [employer]  [phone]    Agencies that provide inexpensive medical care: Organization         Address  Phone   Notes  [facility]  [phone]   [facility]    [phone]   [facility] [address] [place], [place] [address] [phone]   [facility] [address] [place], [place] [phone]   Planned Parenthood    [phone]   [facility]    [phone]   [facility]  [address], [place] Phone:  [phone], Fax:  [phone] Hours of Operation:  9 am - 6 pm, M-F.  Also accepts Medicaid/Medicare and self-pay.  [facility]  [address], [place] Phone: [phone], Fax: [phone]. Hours of Operation:  8:30 am - 5:30 pm, M-F.  Also accepts Medicaid and self-pay.  [facility] [place] [address] [place] Phone: [phone]   [facility] [address] Natasha Bence [place], [place] [phone], Ext. [phone] Mondays & Thursdays: 7-9 AM.  First 15 patients are seen on a first come, first serve basis.    Medicaid-accepting [facility] Providers:  Organization         Address  Phone   Notes  [facility] [address],  [phone] Also accepts self-pay patients.  [facility] [address] Laurell Josephs [place], [place]  [phone]   [facility] [address], [place] [phone]   [facility] [address] [place] [phone]   Renaye Rakers [address], [place]   [phone] Only accepts [place] patients after they have their name applied to their card.   Self-Pay (no  insurance) in [facility]:  Organization         Address  Phone   Notes  Sickle Cell Patients, [facility] [address] [place], [place] [phone]   [facility] [address] [place], [place] [phone]   [facility] [place]  [address], [place] [phone]   [facility]/Dr. Osei-Bonsu  [address] [place] or [address], [place] [phone] Phone number for both [place] and [place] locations is the same.  Urgent Medical and [facility] [address], [place] [phone]   [facility] [address], [place] or [address] [phone]   [facility] [address] [place] [phone], phone; [phone], fax Sees patients 1st and 3rd Saturday of every month.  Must not qualify for public or private insurance (i.e. Medicaid, Medicare, [place] Health Choice, Veterans' Benefits)  Household income should be no more than 200% of the poverty level The clinic cannot treat you if you are pregnant or think you are pregnant  Sexually transmitted diseases are not treated at the clinic.    Dental Care: Organization         Address  Phone  Notes  [facility] Department of [facility] [address] [place], [place] [phone] Accepts children up to age 43 who are enrolled in [place] or [place] Health Choice; pregnant women with a Medicaid card; and children who have applied for Medicaid or [place] Health Choice, but were declined, whose parents can pay a reduced fee at time of service.  [facility] Department of [facility]  [address], [place] [phone] Accepts children up to age 66 who are enrolled in [place] or [place] Health Choice; pregnant women with a Medicaid card; and children who have applied for Medicaid or [place] Health Choice, but were  declined, whose parents can pay a reduced fee at time of service.  [facility]  [address] [place], [place] [phone] Patients are seen by appointment only. Walk-ins are not accepted. [facility] will see patients 78 years of age and older. Monday - Tuesday (8am-5pm) Most Wednesdays (8:30-5pm) $30 per visit, cash only  [facility] Adult Dental Access PROGRAM  [address], [facility] [phone] Patients are seen by appointment only. Walk-ins are not accepted. [facility] will see patients 60 years of age and older. One Wednesday Evening (Monthly: Volunteer Based).  $30 per visit, cash only  [employer]  [phone] for adults; Children under age 45, call Graduate Pediatric Dentistry at [phone]. Children aged 38-14, please call [phone] to request a pediatric application.  Dental services are provided in all areas of dental care including fillings, crowns and bridges, complete and partial dentures, implants, gum treatment, root canals, and extractions. Preventive care is also provided. Treatment is provided to both adults and children. Patients are selected via a lottery and there is often a waiting list.   [facility] [address] [place]  [phone] [email or web address]   [facility] [address] [place], [place] [phone], Ext. [phone] Second and Fourth Thursday of each month, opens at 6:30 AM; Clinic ends at 9 AM.  Patients are seen on a first-come first-served basis, and a limited number are seen during each clinic.   [facility]  [address] [place], [place] [phone]   Eligibility Requirements You must have lived in [place], [place], or [place] counties for at least the last three months.  You cannot be eligible for state or federal sponsored [place]healthcare insurance, including [employer]ans [employer], [place]icaid, or [place]icare.   You generally cannot be  eligible for healthcare insurance through your employer.    How to apply: Eligibility screenings are held every Tuesday and Wednesday afternoon from 1:00 pm until 4:00 pm. You do not need an appointment for the interview!  [facility] [address], [place], [place] [phone]   [facility]  [phone]   [facility]  [phone]   [facility]  [phone]    Behavioral Health Resources in the Community: Intensive Outpatient Programs Organization         Address  Phone  Notes  [facility] [address], [place], [place] [phone]   [facility] [address], [place], [place] [phone]   [facility] [address], [place], [place]  [phone]   [facility] [address],  [place], [place] [phone] or [phone]   Substance Abuse Resources Organization         Address  Phone  Notes  [facility]  [phone]   [facility]  [phone]   [facility]  [phone]   [facility]  [phone]   Residential & Outpatient Substance Abuse Program  [phone]   Psychological Services Organization         Address  Phone  Notes  [facility]  [phone]   [facility]  [phone]- [phone]   [facility] [address], [place] [phone] or [phone]    Mobile Crisis Teams Organization         Address  Phone  Notes  [facility], Mobile Crisis Care Unit  [phone]   [facility]  [address] [place], [place] [phone]   Doristine LocksSharon DeEsch [address] [place] [phone]    Self-Help/Support Groups Organization         Address  Phone             Notes  [facility] [place] - variety of support groups  336- [phone] Call for more information   Narcotics Anonymous (NA), [facility]aring [facility] [address], [place] Home  2 meetings at this location   Statisticianesidential Treatment Programs Organization         Address  Phone  Notes  [facility] [address],    [place]  [phone]   [facility]  [address], [place] [phone], [place], [place] [phone]   [facility] [address], [place] [phone] Admissions: 8am-3pm M-F  [facility] [address]    [place], [place] [phone]   [facility] [address], [place], [place] [phone]   [facility] [address]  [place], [place] [phone]   [facility] - Intensive Outpatient [address], [place], [place] [phone]   [facility] ([facility] [address]  [place], [place] [phone] or [phone]   [facility] ([facility]) [address] [place], [place] [phone] Accepts Medicaid  [facility] [address]  [place] [phone] Substance Abuse/Addiction Treatment   [facility] Resources Organization         Address  Phone  Notes  [facility]  [phone]   Angie FavaJulie Brannon, PhD [phone] Coach  RdDuanne Moron, [place]   [phone] or [phone]   [facility]   [address] [place], [place] [phone]   [facility] [address], [place], [place] [phone] Insurance/Medicaid/sponsorship through [facility] Families [address]                                    [place], [place] [phone] Therapy/tele-psych/case  [facility] [address], [place] [phone]    Dr. Lolly Mustache  [phone]   [facility]  [employer] [facility] Dept. 1) [address] [place] 2) [address] [place] 3)  [address], [place] [phone]  [phone]   [facility] Child Abuse Hotline 4505739093 or [phone] (After Hours)

## 2013-08-06 NOTE — ED Notes (Signed)
Patient transported to X-ray 

## 2013-08-09 NOTE — ED Provider Notes (Signed)
Medical screening examination/treatment/procedure(s) were performed by non-physician practitioner and as supervising physician I was immediately available for consultation/collaboration.    Faren Florence R Judeen Geralds, MD 08/09/13 0910 

## 2013-09-30 ENCOUNTER — Encounter (HOSPITAL_COMMUNITY): Payer: Self-pay | Admitting: Emergency Medicine

## 2013-09-30 ENCOUNTER — Emergency Department (HOSPITAL_COMMUNITY)
Admission: EM | Admit: 2013-09-30 | Discharge: 2013-09-30 | Discharge status: Against Medical Advice | Payer: 59 | Attending: Emergency Medicine | Admitting: Emergency Medicine

## 2013-09-30 DIAGNOSIS — R1084 Generalized abdominal pain: Secondary | ICD-10-CM | POA: Insufficient documentation

## 2013-09-30 DIAGNOSIS — J45909 Unspecified asthma, uncomplicated: Secondary | ICD-10-CM | POA: Insufficient documentation

## 2013-09-30 DIAGNOSIS — F17200 Nicotine dependence, unspecified, uncomplicated: Secondary | ICD-10-CM | POA: Insufficient documentation

## 2013-09-30 DIAGNOSIS — R112 Nausea with vomiting, unspecified: Secondary | ICD-10-CM | POA: Insufficient documentation

## 2013-09-30 LAB — URINALYSIS, ROUTINE W REFLEX MICROSCOPIC
Bilirubin Urine: NEGATIVE
Glucose, UA: NEGATIVE mg/dL
Ketones, ur: NEGATIVE mg/dL
Nitrite: NEGATIVE
Protein, ur: 100 mg/dL — AB
Specific Gravity, Urine: 1.024 (ref 1.005–1.030)
Urobilinogen, UA: 1 mg/dL (ref 0.0–1.0)
pH: 8.5 — ABNORMAL HIGH (ref 5.0–8.0)

## 2013-09-30 LAB — URINE MICROSCOPIC-ADD ON

## 2013-09-30 LAB — POC URINE PREG, ED: Preg Test, Ur: NEGATIVE

## 2013-09-30 MED ORDER — ONDANSETRON 4 MG PO TBDP
8.0000 mg | ORAL_TABLET | Freq: Once | ORAL | Status: AC
  Administered 2013-09-30: 8 mg via ORAL
  Filled 2013-09-30: qty 2

## 2013-09-30 NOTE — ED Notes (Signed)
Pt reports nausea that started today. Vomited x1. Pt reports generalized abdominal pain with frequent urination.

## 2013-09-30 NOTE — ED Notes (Signed)
Called for room x3 °

## 2013-10-16 ENCOUNTER — Emergency Department (HOSPITAL_COMMUNITY)
Admission: EM | Admit: 2013-10-16 | Discharge: 2013-10-16 | Disposition: A | Discharge status: Home / Self Care | Payer: 59 | Attending: Emergency Medicine | Admitting: Emergency Medicine

## 2013-10-16 ENCOUNTER — Encounter (HOSPITAL_COMMUNITY): Payer: Self-pay | Admitting: Emergency Medicine

## 2013-10-16 DIAGNOSIS — J45901 Unspecified asthma with (acute) exacerbation: Secondary | ICD-10-CM | POA: Insufficient documentation

## 2013-10-16 DIAGNOSIS — Z87440 Personal history of urinary (tract) infections: Secondary | ICD-10-CM | POA: Insufficient documentation

## 2013-10-16 DIAGNOSIS — F17200 Nicotine dependence, unspecified, uncomplicated: Secondary | ICD-10-CM | POA: Insufficient documentation

## 2013-10-16 DIAGNOSIS — R109 Unspecified abdominal pain: Secondary | ICD-10-CM | POA: Insufficient documentation

## 2013-10-16 DIAGNOSIS — Z79899 Other long term (current) drug therapy: Secondary | ICD-10-CM | POA: Insufficient documentation

## 2013-10-16 LAB — URINALYSIS, ROUTINE W REFLEX MICROSCOPIC
Bilirubin Urine: NEGATIVE
Glucose, UA: NEGATIVE mg/dL
Hgb urine dipstick: NEGATIVE
Ketones, ur: NEGATIVE mg/dL
Leukocytes, UA: NEGATIVE
Nitrite: NEGATIVE
Protein, ur: NEGATIVE mg/dL
Specific Gravity, Urine: 1.015 (ref 1.005–1.030)
Urobilinogen, UA: 0.2 mg/dL (ref 0.0–1.0)
pH: 5.5 (ref 5.0–8.0)

## 2013-10-16 MED ORDER — ONDANSETRON HCL 4 MG/2ML IJ SOLN
4.0000 mg | Freq: Once | INTRAMUSCULAR | Status: AC
  Administered 2013-10-16: 4 mg via INTRAVENOUS
  Filled 2013-10-16: qty 2

## 2013-10-16 MED ORDER — PREDNISONE 10 MG PO TABS: 40.0000 mg | ORAL_TABLET | Freq: Every day | ORAL | Status: DC

## 2013-10-16 MED ORDER — SODIUM CHLORIDE 0.9 % IV BOLUS (SEPSIS)
1000.0000 mL | Freq: Once | INTRAVENOUS | Status: AC
  Administered 2013-10-16: 1000 mL via INTRAVENOUS

## 2013-10-16 MED ORDER — GI COCKTAIL ~~LOC~~
30.0000 mL | Freq: Once | ORAL | Status: AC
  Administered 2013-10-16: 30 mL via ORAL
  Filled 2013-10-16: qty 30

## 2013-10-16 MED ORDER — ALBUTEROL SULFATE HFA 108 (90 BASE) MCG/ACT IN AERS
6.0000 | INHALATION_SPRAY | Freq: Once | RESPIRATORY_TRACT | Status: AC
  Administered 2013-10-16: 6 via RESPIRATORY_TRACT
  Filled 2013-10-16: qty 6.7

## 2013-10-16 MED ORDER — SODIUM CHLORIDE 0.45 % IV BOLUS: 1000.0000 mL | Freq: Once | INTRAVENOUS | Status: DC

## 2013-10-16 NOTE — ED Notes (Signed)
Pt to ED via [facility] for further evaluation of shortness of breath.  Pt has hx of asthma, became short of breath earlier today- used Albuterol inhaler X 3 at home without relief and called EMS- inhaler was found to be expired.  EMS found pt to have expiratory wheezing throughout- duo neb administered 5 Albuterol 0.5 Atrovent- wheezing improved, EMS then found pt to have stridor- 2:1 Epi treatment given- pt refused transport at that time.  Called EMS 10 minutes later and expiratory wheezing was present again, a second duo neb 5 Albuterol 0.5 Atrovent was given, 125 Solumedrol- pt completing treatment upon arrival to ED.  Tachycardic at present, able to speak in full sentences.

## 2013-10-16 NOTE — ED Notes (Signed)
Social work at bedside.  

## 2013-10-16 NOTE — ED Notes (Signed)
Dr. Harper at bedside. 

## 2013-10-16 NOTE — Discharge Instructions (Signed)
TAKE 2 PUFFS OF YOUR INHALER EVERY 4 HOURS FOR THE NEXT THREE DAYS.  TAKE PREDNISONE AS DIRECTED.   Asthma, Adult Asthma is a recurring condition in which the airways tighten and narrow. Asthma can make it difficult to breathe. It can cause coughing, wheezing, and shortness of breath. Asthma episodes (also called asthma attacks) range from minor to life-threatening. Asthma cannot be cured, but medicines and lifestyle changes can help control it. CAUSES Asthma is believed to be caused by inherited (genetic) and environmental factors, but its exact cause is unknown. Asthma may be triggered by allergens, lung infections, or irritants in the air. Asthma triggers are different for each person. Common triggers include:   Animal dander.  Dust mites.  Cockroaches.  Pollen from trees or grass.  Mold.  Smoke.  Air pollutants such as dust, household cleaners, hair sprays, aerosol sprays, paint fumes, strong chemicals, or strong odors.  Cold air, weather changes, and winds (which increase molds and pollens in the air).  Strong emotional expressions such as crying or laughing hard.  Stress.  Certain medicines (such as aspirin) or types of drugs (such as beta-blockers).  Sulfites in foods and drinks. Foods and drinks that may contain sulfites include dried fruit, potato chips, and sparkling grape juice.  Infections or inflammatory conditions such as the flu, a cold, or an inflammation of the nasal membranes (rhinitis).  Gastroesophageal reflux disease (GERD).  Exercise or strenuous activity. SYMPTOMS Symptoms may occur immediately after asthma is triggered or many hours later. Symptoms include:  Wheezing.  Excessive nighttime or early morning coughing.  Frequent or severe coughing with a common cold.  Chest tightness.  Shortness of breath. DIAGNOSIS  The diagnosis of asthma is made by a review of your medical history and a physical exam. Tests may also be performed. These may  include:  Lung function studies. These tests show how much air you breath in and out.  Allergy tests.  Imaging tests such as X-rays. TREATMENT  Asthma cannot be cured, but it can usually be controlled. Treatment involves identifying and avoiding your asthma triggers. It also involves medicines. There are 2 classes of medicine used for asthma treatment:   Controller medicines. These prevent asthma symptoms from occurring. They are usually taken every day.  Reliever or rescue medicines. These quickly relieve asthma symptoms. They are used as needed and provide short-term relief. Your health care provider will help you create an asthma action plan. An asthma action plan is a written plan for managing and treating your asthma attacks. It includes a list of your asthma triggers and how they may be avoided. It also includes information on when medicines should be taken and when their dosage should be changed. An action plan may also involve the use of a device called a peak flow meter. A peak flow meter measures how well the lungs are working. It helps you monitor your condition. HOME CARE INSTRUCTIONS   Take medicine as directed by your health care provider. Speak with your health care provider if you have questions about how or when to take the medicines.  Use a peak flow meter as directed by your health care provider. Record and keep track of readings.  Understand and use the action plan to help minimize or stop an asthma attack without needing to seek medical care.  Control your home environment in the following ways to help prevent asthma attacks:  Do not smoke. Avoid being exposed to secondhand smoke.  Change your heating and air  conditioning filter regularly.  Limit your use of fireplaces and wood stoves.  Get rid of pests (such as roaches and mice) and their droppings.  Throw away plants if you see mold on them.  Clean your floors and dust regularly. Use unscented cleaning  products.  Try to have someone else vacuum for you regularly. Stay out of rooms while they are being vacuumed and for a short while afterward. If you vacuum, use a dust mask from a hardware store, a double-layered or microfilter vacuum cleaner bag, or a vacuum cleaner with a HEPA filter.  Replace carpet with wood, tile, or vinyl flooring. Carpet can trap dander and dust.  Use allergy-proof pillows, mattress covers, and box spring covers.  Wash bed sheets and blankets every week in hot water and dry them in a dryer.  Use blankets that are made of polyester or cotton.  Clean bathrooms and kitchens with bleach. If possible, have someone repaint the walls in these rooms with mold-resistant paint. Keep out of the rooms that are being cleaned and painted.  Wash hands frequently. SEEK MEDICAL CARE IF:   You have wheezing, shortness of breath, or a cough even if taking medicine to prevent attacks.  The colored mucus you cough up (sputum) is thicker than usual.  Your sputum changes from clear or white to yellow, green, gray, or bloody.  You have any problems that may be related to the medicines you are taking (such as a rash, itching, swelling, or trouble breathing).  You are using a reliever medicine more than 2 3 times per week.  Your peak flow is still at 50 79% of you personal best after following your action plan for 1 hour. SEEK IMMEDIATE MEDICAL CARE IF:   You seem to be getting worse and are unresponsive to treatment during an asthma attack.  You are short of breath even at rest.  You get short of breath when doing very little physical activity.  You have difficulty eating, drinking, or talking due to asthma symptoms.  You develop chest pain.  You develop a fast heartbeat.  You have a bluish color to your lips or fingernails.  You are lightheaded, dizzy, or faint.  Your peak flow is less than 50% of your personal best.  You have a fever or persistent symptoms for more  than 2 3 days.  You have a fever and symptoms suddenly get worse. MAKE SURE YOU:   Understand these instructions.  Will watch your condition.  Will get help right away if you are not doing well or get worse. Document Released: 06/13/2005 Document Revised: 02/13/2013 Document Reviewed: 01/10/2013 [facility] Patient Information 2014 [employer], [place].

## 2013-10-16 NOTE — ED Provider Notes (Signed)
CSN: [id number]     Arrival date & time 10/16/13  1808 History   First MD Initiated Contact with Patient 10/16/13 1812     Chief Complaint  Patient presents with  . Shortness of Breath     (Consider location/radiation/quality/duration/timing/severity/associated sxs/prior Treatment) HPI  This is a 23 y.o. female with PMH of UTIs, asthma, presenting with dyspnea. Onset earlier today, at home. Worsened, so she called EMS. EMS present gave her albuterol, Atrovent. Patient's symptoms improved, so she refused transport here. She then again became dyspneic. Additional DuoNeb was administered and she agreed for transfer here after also receiving 125 mg of Solu-Medrol IV. Patient states that her inhaler did not help today; however, it has become apparent that her inhaler is expired. Patient denies chest pain, fever.  Negative for nausea, vomiting, dysuria, hematuria. Positive for vague abdominal pain.    Past Medical History  Diagnosis Date  . UTI (lower urinary tract infection)   . Asthma    History reviewed. No pertinent past surgical history. Family History  Problem Relation Age of Onset  . Hyperlipidemia Other   . Hypertension Other   . Arthritis Other   . Cancer Other     breast cancer  . Hypertension Other   . Diabetes Other    History  Substance Use Topics  . Smoking status: Current Every Day Smoker  . Smokeless tobacco: Never Used  . Alcohol Use: Yes     Comment: 1-5 cans a week   OB History   Grav Para Term Preterm Abortions TAB SAB Ect Mult Living                 Review of Systems  Constitutional: Negative for fever and chills.  HENT: Negative for facial swelling.   Eyes: Negative for photophobia and pain.  Respiratory: Positive for shortness of breath. Negative for cough.   Cardiovascular: Negative for chest pain and leg swelling.  Gastrointestinal: Positive for abdominal pain. Negative for nausea and vomiting.  Genitourinary: Negative for dysuria.   Musculoskeletal: Negative for arthralgias.  Skin: Negative for rash and wound.  Neurological: Negative for seizures.  Hematological: Negative for adenopathy.      Allergies  Review of patient's allergies indicates no known allergies.  Home Medications   Prior to Admission medications   Medication Sig Start Date End Date Taking? Authorizing Provider  acetaminophen (TYLENOL) 325 MG tablet Take 650 mg by mouth every 6 (six) hours as needed for mild pain.   Yes Historical Provider, MD  albuterol (PROVENTIL HFA;VENTOLIN HFA) 108 (90 BASE) MCG/ACT inhaler Inhale 2 puffs into the lungs every 6 (six) hours as needed for wheezing. 08/16/12  Yes Corwin LevinsJames W John, MD  fexofenadine (ALLEGRA) 60 MG tablet Take 60 mg by mouth 2 (two) times daily.   Yes Historical Provider, MD   BP 156/86  Pulse 142  Temp(Src) 98.3 F (36.8 C)  Resp 24  Ht 4\' 11"  (1.499 m)  Wt 144 lb (65.318 kg)  BMI 29.07 kg/m2  SpO2 94%  LMP 09/30/2013 Physical Exam  Constitutional: She is oriented to person, place, and time. She appears well-developed and well-nourished. No distress.  HENT:  Head: Normocephalic and atraumatic.  Mouth/Throat: No oropharyngeal exudate.  Eyes: Conjunctivae are normal. Pupils are equal, round, and reactive to light. No scleral icterus.  Neck: Normal range of motion. No tracheal deviation present. No thyromegaly present.  Cardiovascular: Normal rate, regular rhythm and normal heart sounds.  Exam reveals no gallop and no friction rub.  No murmur heard. Pulmonary/Chest: Effort normal. No stridor. No respiratory distress. She has wheezes. She has no rales. She exhibits no tenderness.  Abdominal: Soft. She exhibits no distension and no mass. There is no tenderness. There is no rebound and no guarding.  Musculoskeletal: Normal range of motion. She exhibits no edema.  Neurological: She is alert and oriented to person, place, and time.  Skin: Skin is warm and dry. She is not diaphoretic.    ED  Course  Procedures (including critical care time) Labs Review Labs Reviewed  URINALYSIS, ROUTINE W REFLEX MICROSCOPIC    MDM   Final diagnoses:  None    This is a 23 y.o. female with PMH of UTIs, asthma, presenting with dyspnea. Onset earlier today, at home. Worsened, so she called EMS. EMS present gave her albuterol, Atrovent. Patient's symptoms improved, so she refused transport here. She then again became dyspneic. Additional DuoNeb was administered and she agreed for transfer here after also receiving 125 mg of Solu-Medrol IV. Patient states that her inhaler did not help today; however, it has become apparent that her inhaler is expired. Patient denies chest pain, fever.  Negative for nausea, vomiting, dysuria, hematuria. Positive for vague abdominal pain.   Patient states that abdominal pain is reminiscent of past UTIs. Examination reveals diffuse wheezing, tachycardia. Patient was not tachycardic per EMS until albuterol was administered. Patient is afebrile, with normal respiratory rate, normal work of breathing. I do not appreciate retractions at this time. Steroids had been administered, as well as 6 puffs from [facility] as well as after mentioned treatments by paramedics.  Will followup urinalysis, ambulate patient and ensure that she does not desaturate.  Patient is ambulated throughout the departments with lowest O2 saturation being 90%. This is reassuring. I have presented to bedside for reevaluation. Patient is lying comfortably, supine, without work of breathing. She states she is ready for discharge. I agree with this. Urinalysis is negative for signs of UTI. Pt stable for discharge with prescriptions for steroids, counseling on asthma exacerbation, FU.  All questions answered.  Return precautions given.  I have discussed case and care has been guided by my attending physician, Dr. Patria Maneampos.  Loma BostonStirling Henretta Quist, MD 10/17/13 [phone]

## 2013-10-17 NOTE — ED Provider Notes (Signed)
ECG interpretation   Date: 10/17/2013  Rate: 133  Rhythm: sinus tachycardia  QRS Axis: normal  Intervals: normal  ST/T Wave abnormalities: normal  Conduction Disutrbances: none  Narrative Interpretation:   Old EKG Reviewed: No significant changes noted     Lyanne CoKevin M Zuriel Yeaman, MD 10/17/13 [phone]

## 2013-10-18 NOTE — Progress Notes (Addendum)
Late Entry: ED CM consulted to met with patient regarding f/u care for asthma exacerbation. Pt presented to [facility] ED with asthma symptoms exacerbation. Pt has had 5 ED visits in the past 6 months. Pt has PCP and health coverage. Met with patient at bedside. Pt stated, that she has had to come to ED more often in the last couple of months.  Discussed an asthma action plan. Pt states, she is not familiar with the action plan. Printed an action plan and explained how it is used, Also recommended that she f/u with a Pulmonologist, patient agrees with plan.  Provided  patient with [facility] she selcted  Dr. P. Wright's office.  Number was given  to call schedule an appt. Pt states, she will call to schedule appt. Pt verbalizes understanding teach back done. Discussed discharge plan with Dr. Campos who is agreeable with plan. No further ED CM needs identified.  

## 2013-10-19 NOTE — ED Provider Notes (Signed)
I saw and evaluated the patient, reviewed the resident's note and I agree with the findings and plan.   EKG Interpretation   Date/Time:  Wednesday October 16 2013 18:17:47 EDT Ventricular Rate:  133 PR Interval:  112 QRS Duration: 73 QT Interval:  314 QTC Calculation: 467 R Axis:   64 Text Interpretation:  Sinus tachycardia Ventricular premature complex  Abnormal T, consider ischemia, diffuse leads ED PHYSICIAN INTERPRETATION  AVAILABLE IN [facility] Confirmed by TEST, Record ([id number]) on  10/18/2013 6:59:45 AM      Well appearing. Improvement in symptoms with bronchodilators.   Lyanne CoKevin M Janeal Abadi, MD 10/19/13 [phone]

## 2013-10-22 ENCOUNTER — Ambulatory Visit (INDEPENDENT_AMBULATORY_CARE_PROVIDER_SITE_OTHER): Payer: 59 | Admitting: Internal Medicine

## 2013-10-22 ENCOUNTER — Encounter: Payer: Self-pay | Admitting: Internal Medicine

## 2013-10-22 VITALS — BP 112/80 | HR 82 | Temp 98.2°F | Ht 59.0 in | Wt 146.4 lb

## 2013-10-22 DIAGNOSIS — J45901 Unspecified asthma with (acute) exacerbation: Secondary | ICD-10-CM

## 2013-10-22 MED ORDER — BUDESONIDE-FORMOTEROL FUMARATE 160-4.5 MCG/ACT IN AERO: 2.0000 | INHALATION_SPRAY | Freq: Two times a day (BID) | RESPIRATORY_TRACT | Status: DC

## 2013-10-22 MED ORDER — METHYLPREDNISOLONE ACETATE 80 MG/ML IJ SUSP
80.0000 mg | Freq: Once | INTRAMUSCULAR | Status: AC
  Administered 2013-10-22: 80 mg via INTRAMUSCULAR

## 2013-10-22 NOTE — Patient Instructions (Signed)
You had the steroid shot today  OK to finish the prednisone if you have some left over  Please use the Symbicort sample at 2 puffs twice per day, then start the prescription if not too expensive (ok to call for change if too expensive)  You can likely stop the symbicort about mid June, but please re-start if your symtpoms and wheezing return  Please return in 1 year for your yearly visit, or sooner if needed

## 2013-10-22 NOTE — Progress Notes (Signed)
Pre visit review using our clinic review tool, if applicable. No additional management support is needed unless otherwise documented below in the visit note. 

## 2013-10-22 NOTE — Assessment & Plan Note (Signed)
Likely seasonal exac; mild but with persistent wheeze and mild doe though improved with oral predpack from ER; ok for sample symbicort 160 asd, depomedrol IM x 1 today; I suspect seasonal allergy/asthma, so may be able to hold on taking symbicort about mid June; consider allergy referral if persists or worsens

## 2013-10-22 NOTE — Progress Notes (Signed)
   Subjective:    Patient ID: Andrea Daniels, female    DOB: 09/06/1990, 23 y.o.   MRN: [id number]  HPI  Here to f/u recent ER visit asthma exac, now subjectively improved but still mild cough, sob/doe, wheeze, tighness.  No fever and Pt denies chest pain, increased sob or doe, wheezing, orthopnea, PND, increased LE swelling, palpitations, dizziness or syncope other than above;Marland Kitchen. No nighttime awakenings. Symptoms onset with the recent onset spring allergy season.  Does have several wks ongoing nasal allergy symptoms with clearish congestion, itch and sneezing, Past Medical History  Diagnosis Date  . UTI (lower urinary tract infection)   . Asthma    No past surgical history on file.  reports that she has been smoking.  She has never used smokeless tobacco. She reports that she drinks alcohol. She reports that she does not use illicit drugs. family history includes Arthritis in her other; Cancer in her other; Diabetes in her other; Hyperlipidemia in her other; Hypertension in her other and other. No Known Allergies Current Outpatient Prescriptions on File Prior to Visit  Medication Sig Dispense Refill  . acetaminophen (TYLENOL) 325 MG tablet Take 650 mg by mouth every 6 (six) hours as needed for mild pain.      Marland Kitchen. albuterol (PROVENTIL HFA;VENTOLIN HFA) 108 (90 BASE) MCG/ACT inhaler Inhale 2 puffs into the lungs every 6 (six) hours as needed for wheezing.  1 Inhaler  2  . fexofenadine (ALLEGRA) 60 MG tablet Take 60 mg by mouth 2 (two) times daily.      . predniSONE (DELTASONE) 10 MG tablet Take 4 tablets (40 mg total) by mouth daily.  16 tablet  0   No current facility-administered medications on file prior to visit.   Review of Systems All otherwise neg per pt     Objective:   Physical Exam BP 112/80  Pulse 82  Temp(Src) 98.2 F (36.8 C) (Oral)  Ht 4\' 11"  (1.499 m)  Wt 146 lb 6 oz (66.395 kg)  BMI 29.55 kg/m2  SpO2 98%  LMP 09/30/2013 VS noted,  Constitutional: Pt appears  well-developed, well-nourished.  HENT: Head: NCAT.  Right Ear: External ear normal.  Left Ear: External ear normal.  Eyes: . Pupils are equal, round, and reactive to light. Conjunctivae and EOM are normal Neck: Normal range of motion. Neck supple.  Cardiovascular: Normal rate and regular rhythm.   Pulmonary/Chest: Effort normal and breath sounds decreased bilat with polyphonic wheeze bases bilat.  Neurological: Pt is alert. Not confused , motor grossly intact Skin: Skin is warm. No rash Psychiatric: Pt behavior is normal. No agitation.     Assessment & Plan:

## 2013-11-18 ENCOUNTER — Emergency Department (HOSPITAL_COMMUNITY)
Admission: EM | Admit: 2013-11-18 | Discharge: 2013-11-18 | Disposition: A | Discharge status: Home / Self Care | Payer: 59 | Attending: Emergency Medicine | Admitting: Emergency Medicine

## 2013-11-18 ENCOUNTER — Encounter (HOSPITAL_COMMUNITY): Payer: Self-pay | Admitting: Emergency Medicine

## 2013-11-18 DIAGNOSIS — R1032 Left lower quadrant pain: Secondary | ICD-10-CM | POA: Insufficient documentation

## 2013-11-18 DIAGNOSIS — R1013 Epigastric pain: Secondary | ICD-10-CM | POA: Insufficient documentation

## 2013-11-18 DIAGNOSIS — Z8719 Personal history of other diseases of the digestive system: Secondary | ICD-10-CM | POA: Insufficient documentation

## 2013-11-18 DIAGNOSIS — Z87440 Personal history of urinary (tract) infections: Secondary | ICD-10-CM | POA: Insufficient documentation

## 2013-11-18 DIAGNOSIS — IMO0002 Reserved for concepts with insufficient information to code with codable children: Secondary | ICD-10-CM | POA: Insufficient documentation

## 2013-11-18 DIAGNOSIS — F17200 Nicotine dependence, unspecified, uncomplicated: Secondary | ICD-10-CM | POA: Insufficient documentation

## 2013-11-18 DIAGNOSIS — J45909 Unspecified asthma, uncomplicated: Secondary | ICD-10-CM | POA: Insufficient documentation

## 2013-11-18 DIAGNOSIS — Z8742 Personal history of other diseases of the female genital tract: Secondary | ICD-10-CM | POA: Insufficient documentation

## 2013-11-18 DIAGNOSIS — Z3202 Encounter for pregnancy test, result negative: Secondary | ICD-10-CM | POA: Insufficient documentation

## 2013-11-18 DIAGNOSIS — Z79899 Other long term (current) drug therapy: Secondary | ICD-10-CM | POA: Insufficient documentation

## 2013-11-18 DIAGNOSIS — R112 Nausea with vomiting, unspecified: Secondary | ICD-10-CM

## 2013-11-18 HISTORY — DX: Gastritis, unspecified, without bleeding: K29.70

## 2013-11-18 HISTORY — DX: Unspecified ovarian cyst, unspecified side: N83.209

## 2013-11-18 LAB — COMPREHENSIVE METABOLIC PANEL
ALT: 9 U/L (ref 0–35)
AST: 17 U/L (ref 0–37)
Albumin: 3.9 g/dL (ref 3.5–5.2)
Alkaline Phosphatase: 71 U/L (ref 39–117)
BUN: 9 mg/dL (ref 6–23)
CO2: 20 mEq/L (ref 19–32)
Calcium: 9 mg/dL (ref 8.4–10.5)
Chloride: 103 mEq/L (ref 96–112)
Creatinine, Ser: 0.72 mg/dL (ref 0.50–1.10)
GFR calc Af Amer: >90 mL/min (ref >90)
GFR calc non Af Amer: >90 mL/min (ref >90)
Glucose, Bld: 92 mg/dL (ref 70–99)
Potassium: 3.8 mEq/L (ref 3.7–5.3)
Sodium: 139 mEq/L (ref 137–147)
Total Bilirubin: 0.4 mg/dL (ref 0.3–1.2)
Total Protein: 8.2 g/dL (ref 6.0–8.3)

## 2013-11-18 LAB — CBC WITH DIFFERENTIAL/PLATELET
Basophils Absolute: 0.1 10*3/uL (ref 0.0–0.1)
Basophils Relative: 1 % (ref 0–1)
Eosinophils Absolute: 0.3 10*3/uL (ref 0.0–0.7)
Eosinophils Relative: 5 % (ref 0–5)
HCT: 38.7 % (ref 36.0–46.0)
Hemoglobin: 13.4 g/dL (ref 12.0–15.0)
Lymphocytes Relative: 40 % (ref 12–46)
Lymphs Abs: 2.6 10*3/uL (ref 0.7–4.0)
MCH: 28 pg (ref 26.0–34.0)
MCHC: 34.6 g/dL (ref 30.0–36.0)
MCV: 80.8 fL (ref 78.0–100.0)
Monocytes Absolute: 0.5 10*3/uL (ref 0.1–1.0)
Monocytes Relative: 8 % (ref 3–12)
Neutro Abs: 3 10*3/uL (ref 1.7–7.7)
Neutrophils Relative %: 46 % (ref 43–77)
Platelets: 371 10*3/uL (ref 150–400)
RBC: 4.79 MIL/uL (ref 3.87–5.11)
RDW: 12.8 % (ref 11.5–15.5)
WBC: 6.4 10*3/uL (ref 4.0–10.5)

## 2013-11-18 LAB — URINALYSIS, ROUTINE W REFLEX MICROSCOPIC
Bilirubin Urine: NEGATIVE
Glucose, UA: NEGATIVE mg/dL
Hgb urine dipstick: NEGATIVE
Ketones, ur: NEGATIVE mg/dL
Leukocytes, UA: NEGATIVE
Nitrite: POSITIVE — AB
Protein, ur: NEGATIVE mg/dL
Specific Gravity, Urine: 1.015 (ref 1.005–1.030)
Urobilinogen, UA: 0.2 mg/dL (ref 0.0–1.0)
pH: 6.5 (ref 5.0–8.0)

## 2013-11-18 LAB — LIPASE, BLOOD: Lipase: 39 U/L (ref 11–59)

## 2013-11-18 LAB — POC URINE PREG, ED: Preg Test, Ur: NEGATIVE

## 2013-11-18 LAB — URINE MICROSCOPIC-ADD ON

## 2013-11-18 MED ORDER — ONDANSETRON HCL 4 MG PO TABS: 4.0000 mg | ORAL_TABLET | Freq: Four times a day (QID) | ORAL | Status: DC

## 2013-11-18 MED ORDER — PROMETHAZINE HCL 25 MG/ML IJ SOLN
12.5000 mg | Freq: Once | INTRAMUSCULAR | Status: AC
  Administered 2013-11-18: 12.5 mg via INTRAVENOUS
  Filled 2013-11-18: qty 1

## 2013-11-18 MED ORDER — SODIUM CHLORIDE 0.9 % IV SOLN
1000.0000 mL | Freq: Once | INTRAVENOUS | Status: AC
  Administered 2013-11-18: 1000 mL via INTRAVENOUS

## 2013-11-18 MED ORDER — ONDANSETRON HCL 4 MG/2ML IJ SOLN: 4.0000 mg | Freq: Once | INTRAMUSCULAR | Status: DC

## 2013-11-18 MED ORDER — ONDANSETRON HCL 4 MG/2ML IJ SOLN
4.0000 mg | Freq: Once | INTRAMUSCULAR | Status: AC | PRN
  Administered 2013-11-18: 4 mg via INTRAVENOUS
  Filled 2013-11-18: qty 2

## 2013-11-18 MED ORDER — HYDROMORPHONE HCL PF 1 MG/ML IJ SOLN
0.5000 mg | INTRAMUSCULAR | Status: DC | PRN
  Administered 2013-11-18: 0.5 mg via INTRAVENOUS
  Filled 2013-11-18: qty 1

## 2013-11-18 MED ORDER — SODIUM CHLORIDE 0.9 % IV SOLN
1000.0000 mL | INTRAVENOUS | Status: DC
  Administered 2013-11-18: 500 mL via INTRAVENOUS

## 2013-11-18 NOTE — ED Provider Notes (Signed)
CSN: [id number]     Arrival date & time 11/18/13  1110 History   First MD Initiated Contact with Patient 11/18/13 1112     Chief Complaint  Patient presents with  . Abdominal Pain  . Emesis     HPI Comments: Pt went out drinking last night.  Prior to that she felt fine.  This morning she woke up vomiting with pain.  Patient is a 23 y.o. female presenting with abdominal pain and vomiting. The history is provided by the patient.  Abdominal Pain Pain location:  LLQ Pain quality: aching   Pain radiates to:  Chest Pain severity:  Moderate Onset quality:  Gradual Timing:  Constant Progression:  Worsening Chronicity:  Recurrent Context: alcohol use   Relieved by:  Nothing Worsened by:  Nothing tried Associated symptoms: nausea and vomiting   Associated symptoms: no constipation, no diarrhea, no dysuria, no fever, no vaginal bleeding and no vaginal discharge   Emesis Associated symptoms: abdominal pain   Associated symptoms: no diarrhea    patient states she's had trouble with gastritis in the past. These symptoms feel similar to that. she denies any prior abdominal surgeries.  Past Medical History  Diagnosis Date  . UTI (lower urinary tract infection)   . Asthma   . Gastritis   . Ovarian cyst    History reviewed. No pertinent past surgical history. Family History  Problem Relation Age of Onset  . Hyperlipidemia Other   . Hypertension Other   . Arthritis Other   . Cancer Other     breast cancer  . Hypertension Other   . Diabetes Other    History  Substance Use Topics  . Smoking status: Current Every Day Smoker  . Smokeless tobacco: Never Used  . Alcohol Use: Yes     Comment: 1-5 cans a week   OB History   Grav Para Term Preterm Abortions TAB SAB Ect Mult Living                 Review of Systems  Constitutional: Negative for fever.  Gastrointestinal: Positive for nausea, vomiting and abdominal pain. Negative for diarrhea and constipation.  Genitourinary:  Negative for dysuria, vaginal bleeding and vaginal discharge.  All other systems reviewed and are negative.     Allergies  Review of patient's allergies indicates no known allergies.  Home Medications   Prior to Admission medications   Medication Sig Start Date End Date Taking? Authorizing Provider  acetaminophen (TYLENOL) 325 MG tablet Take 650 mg by mouth every 6 (six) hours as needed for mild pain.    Historical Provider, MD  albuterol (PROVENTIL HFA;VENTOLIN HFA) 108 (90 BASE) MCG/ACT inhaler Inhale 2 puffs into the lungs every 6 (six) hours as needed for wheezing. 08/16/12   Corwin Levins, MD  budesonide-formoterol [facility]) 160-4.5 MCG/ACT inhaler Inhale 2 puffs into the lungs 2 (two) times daily. 10/22/13   Corwin Levins, MD  fexofenadine (ALLEGRA) 60 MG tablet Take 60 mg by mouth 2 (two) times daily.    Historical Provider, MD  predniSONE (DELTASONE) 10 MG tablet Take 4 tablets (40 mg total) by mouth daily. 10/17/13   Loma Boston, MD   BP 137/85  Pulse 78  Temp(Src) 97.9 F (36.6 C) (Oral)  Resp 16  SpO2 93%  LMP 11/11/2013 Physical Exam  Nursing note and vitals reviewed. Constitutional: She appears well-developed and well-nourished.  HENT:  Head: Normocephalic and atraumatic.  Right Ear: External ear normal.  Left Ear: External ear normal.  Eyes: Conjunctivae are normal. Right eye exhibits no discharge. Left eye exhibits no discharge. No scleral icterus.  Neck: Neck supple. No tracheal deviation present.  Cardiovascular: Normal rate, regular rhythm and intact distal pulses.   Pulmonary/Chest: Effort normal and breath sounds normal. No stridor. No respiratory distress. She has no wheezes. She has no rales.  Abdominal: Soft. Bowel sounds are normal. She exhibits no distension and no mass. There is tenderness in the epigastric area and left lower quadrant. There is no rebound and no guarding.  Musculoskeletal: She exhibits no edema and no tenderness.  Neurological: She  is alert. She has normal strength. No cranial nerve deficit (no facial droop, extraocular movements intact, no slurred speech) or sensory deficit. She exhibits normal muscle tone. She displays no seizure activity. Coordination normal.  Skin: Skin is warm and dry. No rash noted. She is not diaphoretic.  Psychiatric: She has a normal mood and affect.    ED Course  Procedures (including critical care time) Labs Review Labs Reviewed  URINALYSIS, ROUTINE W REFLEX MICROSCOPIC - Abnormal; Notable for the following:    Nitrite POSITIVE (*)    All other components within normal limits  URINE MICROSCOPIC-ADD ON - Abnormal; Notable for the following:    Bacteria, UA MANY (*)    All other components within normal limits  CBC WITH DIFFERENTIAL  COMPREHENSIVE METABOLIC PANEL  LIPASE, BLOOD  POC URINE PREG, ED    Medications  0.9 %  sodium chloride infusion (1,000 mLs Intravenous New Bag/Given 11/18/13 1140)    Followed by  0.9 %  sodium chloride infusion (not administered)  HYDROmorphone (DILAUDID) injection 0.5 mg (0.5 mg Intravenous Given 11/18/13 1140)  promethazine (PHENERGAN) injection 12.5 mg (not administered)  ondansetron (ZOFRAN) injection 4 mg (4 mg Intravenous Given 11/18/13 1140)    MDM   Final diagnoses:  Nausea and vomiting    Symptoms may be related to her alcohol consumption.  Abdomen is soft.  Doubt appendicits, diverticulitis or other acute emergency condition.  Clean catch urine sample with bacteria.  Will send off urine cx.  Dc home with ant emetics     Linwood DibblesJon Kosta Schnitzler, MD 11/18/13 [phone]

## 2013-11-18 NOTE — ED Notes (Signed)
Per EMS. Pt diagnosed with gastritis a year ago. Was drinking etoh last night, told EMS she had 3 "alcohol." Woke up with abd pain and emesis. Threw up 1x with EMS, EMS gave 4mg  zofran prior to arrival.

## 2013-11-18 NOTE — ED Notes (Signed)
Bed: YP95 Expected date:  Expected time:  Means of arrival:  Comments: EMS- 23yo F, abdominal pain, n/v, ETOH

## 2013-11-18 NOTE — Discharge Instructions (Signed)

## 2013-11-20 ENCOUNTER — Telehealth (HOSPITAL_BASED_OUTPATIENT_CLINIC_OR_DEPARTMENT_OTHER): Payer: Self-pay | Admitting: Emergency Medicine

## 2013-11-20 LAB — URINE CULTURE: Colony Count: >=100000

## 2013-11-20 NOTE — Telephone Encounter (Signed)
Post ED Visit - Positive Culture Follow-up  Culture report reviewed by antimicrobial stewardship pharmacist: []  Wes Dulaney, Pharm.D., BCPS []  Celedonio Miyamoto, Pharm.D., BCPS []  Georgina Pillion, [address].D., BCPS []  [place], [address].D., BCPS, AAHIVP []  Estella Husk, Pharm.D., BCPS, AAHIVP []  Harvie Junior, Pharm.D. [x]  Ofilia Neas, Pharm.D.  Positive urine culture Per [facility] PA-C, no treatment needed and no further patient follow-up is required at this time.  Ramona Slinger 11/20/2013, 2:09 PM

## 2014-01-08 ENCOUNTER — Inpatient Hospital Stay (HOSPITAL_COMMUNITY): Payer: 59

## 2014-01-08 ENCOUNTER — Encounter (HOSPITAL_COMMUNITY): Payer: Self-pay | Admitting: *Deleted

## 2014-01-08 ENCOUNTER — Inpatient Hospital Stay (HOSPITAL_COMMUNITY)
Admission: AD | Admit: 2014-01-08 | Discharge: 2014-01-08 | Disposition: A | Discharge status: Home / Self Care | Payer: 59 | Source: Ambulatory Visit | Attending: Obstetrics & Gynecology | Admitting: Obstetrics & Gynecology

## 2014-01-08 DIAGNOSIS — N7011 Chronic salpingitis: Secondary | ICD-10-CM

## 2014-01-08 DIAGNOSIS — N898 Other specified noninflammatory disorders of vagina: Secondary | ICD-10-CM | POA: Insufficient documentation

## 2014-01-08 DIAGNOSIS — Z8249 Family history of ischemic heart disease and other diseases of the circulatory system: Secondary | ICD-10-CM | POA: Insufficient documentation

## 2014-01-08 DIAGNOSIS — N7013 Chronic salpingitis and oophoritis: Secondary | ICD-10-CM | POA: Insufficient documentation

## 2014-01-08 DIAGNOSIS — R109 Unspecified abdominal pain: Secondary | ICD-10-CM | POA: Insufficient documentation

## 2014-01-08 DIAGNOSIS — F17200 Nicotine dependence, unspecified, uncomplicated: Secondary | ICD-10-CM | POA: Insufficient documentation

## 2014-01-08 DIAGNOSIS — N83209 Unspecified ovarian cyst, unspecified side: Secondary | ICD-10-CM | POA: Insufficient documentation

## 2014-01-08 DIAGNOSIS — R197 Diarrhea, unspecified: Secondary | ICD-10-CM | POA: Insufficient documentation

## 2014-01-08 DIAGNOSIS — R112 Nausea with vomiting, unspecified: Secondary | ICD-10-CM | POA: Insufficient documentation

## 2014-01-08 DIAGNOSIS — Z833 Family history of diabetes mellitus: Secondary | ICD-10-CM | POA: Insufficient documentation

## 2014-01-08 LAB — WET PREP, GENITAL
Clue Cells Wet Prep HPF POC: NONE SEEN
Trich, Wet Prep: NONE SEEN
WBC, Wet Prep HPF POC: NONE SEEN
Yeast Wet Prep HPF POC: NONE SEEN

## 2014-01-08 LAB — URINALYSIS, ROUTINE W REFLEX MICROSCOPIC
Bilirubin Urine: NEGATIVE
Glucose, UA: NEGATIVE mg/dL
Ketones, ur: NEGATIVE mg/dL
Leukocytes, UA: NEGATIVE
Nitrite: NEGATIVE
Protein, ur: 30 mg/dL — AB
Specific Gravity, Urine: 1.02 (ref 1.005–1.030)
Urobilinogen, UA: 0.2 mg/dL (ref 0.0–1.0)
pH: 6 (ref 5.0–8.0)

## 2014-01-08 LAB — URINE MICROSCOPIC-ADD ON

## 2014-01-08 LAB — POCT PREGNANCY, URINE: Preg Test, Ur: NEGATIVE

## 2014-01-08 MED ORDER — DOXYCYCLINE HYCLATE 100 MG PO TABS: 100.0000 mg | ORAL_TABLET | Freq: Two times a day (BID) | ORAL | Status: DC

## 2014-01-08 MED ORDER — CEFTRIAXONE SODIUM 250 MG IJ SOLR
250.0000 mg | Freq: Once | INTRAMUSCULAR | Status: AC
  Administered 2014-01-08: 250 mg via INTRAMUSCULAR
  Filled 2014-01-08: qty 250

## 2014-01-08 MED ORDER — DOXYCYCLINE HYCLATE 100 MG PO TABS
100.0000 mg | ORAL_TABLET | Freq: Once | ORAL | Status: AC
  Administered 2014-01-08: 100 mg via ORAL
  Filled 2014-01-08: qty 1

## 2014-01-08 MED ORDER — ONDANSETRON 8 MG PO TBDP
8.0000 mg | ORAL_TABLET | Freq: Once | ORAL | Status: AC
  Administered 2014-01-08: 8 mg via ORAL
  Filled 2014-01-08: qty 1

## 2014-01-08 NOTE — MAU Note (Signed)
Pt arrived by EMS with menstrual pain x 1 week which has become more severe in the last 24 hours.  Pt started bleeding yesterday.  Doesn't see a regular GYN.  Last month pt had two periods.  Received toradol and fentanyl x 50mg  by EMS prior to arrival.  Pt resting with eyes closed on stretcher.

## 2014-01-08 NOTE — MAU Provider Note (Signed)
History     CSN: [id number]  Arrival date and time: 01/08/14 1247   First Provider Initiated Contact with Patient 01/08/14 1311      Chief Complaint  Patient presents with  . Abdominal Pain   Abdominal Pain   Andrea Daniels is a 23 y.o. G0P0 who presents today with cramping and abdominal pain. She states that the pain started this morning when she woke up. She took a [employer] ASA, but that did not help the pain. She states that her period started on 01/07/14, and she states that she cannot be pregnant because she is in a homosexual relationship. She states that she has had nausea/vomiting and diarrhea. She has not had a fever. Prior to her period starting she had a thick green vaginal discharge with odor.  Past Medical History  Diagnosis Date  . UTI (lower urinary tract infection)   . Asthma   . Gastritis   . Ovarian cyst     Past Surgical History  Procedure Laterality Date  . No past surgeries      Family History  Problem Relation Age of Onset  . Hyperlipidemia Other   . Hypertension Other   . Arthritis Other   . Cancer Other     breast cancer  . Hypertension Other   . Diabetes Other     History  Substance Use Topics  . Smoking status: Current Every Day Smoker -- 0.10 packs/day    Types: Cigarettes  . Smokeless tobacco: Never Used  . Alcohol Use: Yes     Comment: 1-5 cans a week    Allergies: No Known Allergies  Prescriptions prior to admission  Medication Sig Dispense Refill  . acetaminophen (TYLENOL) 325 MG tablet Take 650 mg by mouth every 6 (six) hours as needed for mild pain.      Marland Kitchen. albuterol (PROVENTIL HFA;VENTOLIN HFA) 108 (90 BASE) MCG/ACT inhaler Inhale 2 puffs into the lungs every 6 (six) hours as needed for wheezing.  1 Inhaler  2  . budesonide-formoterol (SYMBICORT) 160-4.5 MCG/ACT inhaler Inhale 2 puffs into the lungs 2 (two) times daily.  1 Inhaler  3  . ondansetron (ZOFRAN) 4 MG tablet Take 1 tablet (4 mg total) by mouth every 6 (six) hours.  12  tablet  0  . predniSONE (DELTASONE) 10 MG tablet Take 4 tablets (40 mg total) by mouth daily.  16 tablet  0    Review of Systems  Gastrointestinal: Positive for abdominal pain.   Physical Exam   Blood pressure 147/90, pulse 59, temperature 98.5 F (36.9 C), temperature source Oral, resp. rate 18, height 4\' 11"  (1.499 m), weight 61.689 kg (136 lb), last menstrual period 01/07/2014, SpO2 99.00%.  Physical Exam  Nursing note and vitals reviewed. Constitutional: She is oriented to person, place, and time. She appears well-developed and well-nourished. No distress.  Cardiovascular: Normal rate.   Respiratory: Effort normal.  GI: Soft. There is no tenderness. There is no rebound.  Genitourinary:   External: no lesion Vagina: small amount of blood in the vagina  Cervix: pink, smooth, +CMT Uterus: NSSC Adnexa: Left tender, right non-tender    Neurological: She is alert and oriented to person, place, and time.  Skin: Skin is warm and dry.  Psychiatric: She has a normal mood and affect.    MAU Course  Procedures Results for orders placed during the hospital encounter of 01/08/14 (from the past 24 hour(s))  WET PREP, GENITAL     Status: None   Collection Time  01/08/14  1:10 PM      Result Value Ref Range   Yeast Wet Prep HPF POC NONE SEEN  NONE SEEN   Trich, Wet Prep NONE SEEN  NONE SEEN   Clue Cells Wet Prep HPF POC NONE SEEN  NONE SEEN   WBC, Wet Prep HPF POC NONE SEEN  NONE SEEN  POCT PREGNANCY, URINE     Status: None   Collection Time    01/08/14  3:23 PM      Result Value Ref Range   Preg Test, Ur NEGATIVE  NEGATIVE   US Transvaginal Non-ob  01/08/2014   CLINICAL DATA:  Pelvic pain, history of ovarian cysts  EXAM: TRANSABDOMINAL AND TRANSVAGINAL ULTRASOUND OF PELVIS  DOPPLER ULTRASOUND OF OVARIES  TECHNIQUE: Both transabdominal and transvaginal ultrasound examinations of the pelvis were performed. Transabdominal technique was performed for global imaging of the pelvis  including uterus, ovaries, adnexal regions, and pelvic cul-de-sac.  It was necessary to proceed with endovaginal exam following the transabdominal exam to visualize the endometrium. Color and duplex Doppler ultrasound was utilized to evaluate blood flow to the ovaries.  COMPARISON:  CT abdomen pelvis dated 08/26/2009  FINDINGS: Uterus  Measurements: 7.9 x 4.0 x 4.4 cm. No fibroids or other mass visualized.  Endometrium  Thickness: 9 mm.  No focal abnormality visualized.  Right ovary  Measurements: 8.5 x 5.0 x 6.2 cm. Dominant 7.5 x 4.7 x 6.7 cm simple cyst without septations, soft tissue components, or mural nodularity.  Left ovary  Measurements: 5.0 x 2.2 x 2.1 cm. Dilated tubular structure in the left adnexa, favored to reflect a hydrosalpinx.  Pulsed Doppler evaluation of both ovaries demonstrates normal low-resistance arterial and venous waveforms.  Other findings  No free fluid.  IMPRESSION: Dominant 7.5 cm simple cyst in the right ovary, almost certainly benign. Follow-up pelvic ultrasound is suggested in 6-12 weeks.  Suspected left hydrosalpinx.  No evidence of ovarian torsion.   Electronically Signed   By: Charline Bills M.D.   On: 01/08/2014 15:22   US Pelvis Complete  01/08/2014   CLINICAL DATA:  Pelvic pain, history of ovarian cysts  EXAM: TRANSABDOMINAL AND TRANSVAGINAL ULTRASOUND OF PELVIS  DOPPLER ULTRASOUND OF OVARIES  TECHNIQUE: Both transabdominal and transvaginal ultrasound examinations of the pelvis were performed. Transabdominal technique was performed for global imaging of the pelvis including uterus, ovaries, adnexal regions, and pelvic cul-de-sac.  It was necessary to proceed with endovaginal exam following the transabdominal exam to visualize the endometrium. Color and duplex Doppler ultrasound was utilized to evaluate blood flow to the ovaries.  COMPARISON:  CT abdomen pelvis dated 08/26/2009  FINDINGS: Uterus  Measurements: 7.9 x 4.0 x 4.4 cm. No fibroids or other mass visualized.   Endometrium  Thickness: 9 mm.  No focal abnormality visualized.  Right ovary  Measurements: 8.5 x 5.0 x 6.2 cm. Dominant 7.5 x 4.7 x 6.7 cm simple cyst without septations, soft tissue components, or mural nodularity.  Left ovary  Measurements: 5.0 x 2.2 x 2.1 cm. Dilated tubular structure in the left adnexa, favored to reflect a hydrosalpinx.  Pulsed Doppler evaluation of both ovaries demonstrates normal low-resistance arterial and venous waveforms.  Other findings  No free fluid.  IMPRESSION: Dominant 7.5 cm simple cyst in the right ovary, almost certainly benign. Follow-up pelvic ultrasound is suggested in 6-12 weeks.  Suspected left hydrosalpinx.  No evidence of ovarian torsion.   Electronically Signed   By: Charline Bills M.D.   On: 01/08/2014 15:22  [place] Art/ven Flow Abd Pelv Doppler  01/08/2014   CLINICAL DATA:  Pelvic pain, history of ovarian cysts  EXAM: TRANSABDOMINAL AND TRANSVAGINAL ULTRASOUND OF PELVIS  DOPPLER ULTRASOUND OF OVARIES  TECHNIQUE: Both transabdominal and transvaginal ultrasound examinations of the pelvis were performed. Transabdominal technique was performed for global imaging of the pelvis including uterus, ovaries, adnexal regions, and pelvic cul-de-sac.  It was necessary to proceed with endovaginal exam following the transabdominal exam to visualize the endometrium. Color and duplex Doppler ultrasound was utilized to evaluate blood flow to the ovaries.  COMPARISON:  CT abdomen pelvis dated 08/26/2009  FINDINGS: Uterus  Measurements: 7.9 x 4.0 x 4.4 cm. No fibroids or other mass visualized.  Endometrium  Thickness: 9 mm.  No focal abnormality visualized.  Right ovary  Measurements: 8.5 x 5.0 x 6.2 cm. Dominant 7.5 x 4.7 x 6.7 cm simple cyst without septations, soft tissue components, or mural nodularity.  Left ovary  Measurements: 5.0 x 2.2 x 2.1 cm. Dilated tubular structure in the left adnexa, favored to reflect a hydrosalpinx.  Pulsed Doppler evaluation of both ovaries  demonstrates normal low-resistance arterial and venous waveforms.  Other findings  No free fluid.  IMPRESSION: Dominant 7.5 cm simple cyst in the right ovary, almost certainly benign. Follow-up pelvic ultrasound is suggested in 6-12 weeks.  Suspected left hydrosalpinx.  No evidence of ovarian torsion.   Electronically Signed   By: Charline Bills M.D.   On: 01/08/2014 15:22     Assessment and Plan   1. Hydrosalpinx   2. Other and unspecified ovarian cyst    GC/CT pending Treated with rocephin IM here in MAU RX doxycycline x 14 days FU in the clinic in 2 weeks   Tawnya Crook 01/08/2014, 1:17 PM

## 2014-01-08 NOTE — MAU Note (Addendum)
Pt unable to void on arrival after attempting.  Pt reports she usually takes ASA for pain.  Pt rates pain now after pain meds given 4 out of 10.  Started with 10 out of 10.  Pt resting with eyes closed.

## 2014-01-08 NOTE — Discharge Instructions (Signed)
Pelvic Inflammatory Disease °Pelvic inflammatory disease (PID) refers to an infection in some or all of the female organs. The infection can be in the uterus, ovaries, fallopian tubes, or the surrounding tissues in the pelvis. PID can cause abdominal or pelvic pain that comes on suddenly (acute pelvic pain). PID is a serious infection because it can lead to lasting (chronic) pelvic pain or the inability to have children (infertile).  °CAUSES  °The infection is often caused by the normal bacteria found in the vaginal tissues. PID may also be caused by an infection that is spread during sexual contact. PID can also occur following:  °· The birth of a baby.   °· A miscarriage.   °· An abortion.   °· Major pelvic surgery.   °· The use of an intrauterine device (IUD).   °· A sexual assault.   °RISK FACTORS °Certain factors can put a person at higher risk for PID, such as: °· Being younger than 25 years. °· Being sexually active at a young age. °· Using nonbarrier contraception. °· Having multiple sexual partners. °· Having sex with someone who has symptoms of a genital infection. °· Using oral contraception. °Other times, certain behaviors can increase the possibility of getting PID, such as: °· Having sex during your period. °· Using a vaginal douche. °· Having an intrauterine device (IUD) in place. °SYMPTOMS  °· Abdominal or pelvic pain.   °· Fever.   °· Chills.   °· Abnormal vaginal discharge. °· Abnormal uterine bleeding.   °· Unusual pain shortly after finishing your period. °DIAGNOSIS  °Your caregiver will choose some of the following methods to make a diagnosis, such as:  °· Performing a physical exam and history. A pelvic exam typically reveals a very tender uterus and surrounding pelvis.   °· Ordering laboratory tests including a pregnancy test, blood tests, and urine test.  °· Ordering cultures of the vagina and cervix to check for a sexually transmitted infection (STI). °· Performing an ultrasound.    °· Performing a laparoscopic procedure to look inside the pelvis.   °TREATMENT  °· Antibiotic medicines may be prescribed and taken by mouth.   °· Sexual partners may be treated when the infection is caused by a sexually transmitted disease (STD).   °· Hospitalization may be needed to give antibiotics intravenously. °· Surgery may be needed, but this is rare. °It may take weeks until you are completely well. If you are diagnosed with PID, you should also be checked for human immunodeficiency virus (HIV).   °HOME CARE INSTRUCTIONS  °· If given, take your antibiotics as directed. Finish the medicine even if you start to feel better.   °· Only take over-the-counter or prescription medicines for pain, discomfort, or fever as directed by your caregiver.   °· Do not have sexual intercourse until treatment is completed or as directed by your caregiver. If PID is confirmed, your recent sexual partner(s) will need treatment.   °· Keep your follow-up appointments. °SEEK MEDICAL CARE IF:  °· You have increased or abnormal vaginal discharge.   °· You need prescription medicine for your pain.   °· You vomit.   °· You cannot take your medicines.   °· Your partner has an STD.   °SEEK IMMEDIATE MEDICAL CARE IF:  °· You have a fever.   °· You have increased abdominal or pelvic pain.   °· You have chills.   °· You have pain when you urinate.   °· You are not better after 72 hours following treatment.   °MAKE SURE YOU:  °· Understand these instructions. °· Will watch your condition. °· Will get help right away if you are not doing well or get worse. °  Document Released: 06/13/2005 Document Revised: 10/08/2012 Document Reviewed: 06/09/2011 [facility] Patient Information 2015 [place], [place]. This information is not intended to replace advice given to you by your health care provider. Make sure you discuss any questions you have with your health care provider.  Ovarian Cyst An ovarian cyst is a fluid-filled sac that forms on an  ovary. The ovaries are small organs that produce eggs in women. Various types of cysts can form on the ovaries. Most are not cancerous. Many do not cause problems, and they often go away on their own. Some may cause symptoms and require treatment. Common types of ovarian cysts include:  Functional cysts--These cysts may occur every month during the menstrual cycle. This is normal. The cysts usually go away with the next menstrual cycle if the woman does not get pregnant. Usually, there are no symptoms with a functional cyst.  Endometrioma cysts--These cysts form from the tissue that lines the uterus. They are also called "chocolate cysts" because they become filled with blood that turns brown. This type of cyst can cause pain in the lower abdomen during intercourse and with your menstrual period.  Cystadenoma cysts--This type develops from the cells on the outside of the ovary. These cysts can get very big and cause lower abdomen pain and pain with intercourse. This type of cyst can twist on itself, cut off its blood supply, and cause severe pain. It can also easily rupture and cause a lot of pain.  Dermoid cysts--This type of cyst is sometimes found in both ovaries. These cysts may contain different kinds of body tissue, such as skin, teeth, hair, or cartilage. They usually do not cause symptoms unless they get very big.  Theca lutein cysts--These cysts occur when too much of a certain hormone (human chorionic gonadotropin) is produced and overstimulates the ovaries to produce an egg. This is most common after procedures used to assist with the conception of a baby (in vitro fertilization). CAUSES   Fertility drugs can cause a condition in which multiple large cysts are formed on the ovaries. This is called ovarian hyperstimulation syndrome.  A condition called polycystic ovary syndrome can cause hormonal imbalances that can lead to nonfunctional ovarian cysts. SIGNS AND SYMPTOMS  Many ovarian  cysts do not cause symptoms. If symptoms are present, they may include:  Pelvic pain or pressure.  Pain in the lower abdomen.  Pain during sexual intercourse.  Increasing girth (swelling) of the abdomen.  Abnormal menstrual periods.  Increasing pain with menstrual periods.  Stopping having menstrual periods without being pregnant. DIAGNOSIS  These cysts are commonly found during a routine or annual pelvic exam. Tests may be ordered to find out more about the cyst. These tests may include:  Ultrasound.  X-ray of the pelvis.  CT scan.  MRI.  Blood tests. TREATMENT  Many ovarian cysts go away on their own without treatment. Your health care provider may want to check your cyst regularly for 2-3 months to see if it changes. For women in menopause, it is particularly important to monitor a cyst closely because of the higher rate of ovarian cancer in menopausal women. When treatment is needed, it may include any of the following:  A procedure to drain the cyst (aspiration). This may be done using a long needle and ultrasound. It can also be done through a laparoscopic procedure. This involves using a thin, lighted tube with a tiny camera on the end (laparoscope) inserted through a small incision.  Surgery to remove the  whole cyst. This may be done using laparoscopic surgery or an open surgery involving a larger incision in the lower abdomen.  Hormone treatment or birth control pills. These methods are sometimes used to help dissolve a cyst. HOME CARE INSTRUCTIONS   Only take over-the-counter or prescription medicines as directed by your health care provider.  Follow up with your health care provider as directed.  Get regular pelvic exams and Pap tests. SEEK MEDICAL CARE IF:   Your periods are late, irregular, or painful, or they stop.  Your pelvic pain or abdominal pain does not go away.  Your abdomen becomes larger or swollen.  You have pressure on your bladder or trouble  emptying your bladder completely.  You have pain during sexual intercourse.  You have feelings of fullness, pressure, or discomfort in your stomach.  You lose weight for no apparent reason.  You feel generally ill.  You become constipated.  You lose your appetite.  You develop acne.  You have an increase in body and facial hair.  You are gaining weight, without changing your exercise and eating habits.  You think you are pregnant. SEEK IMMEDIATE MEDICAL CARE IF:   You have increasing abdominal pain.  You feel sick to your stomach (nauseous), and you throw up (vomit).  You develop a fever that comes on suddenly.  You have abdominal pain during a bowel movement.  Your menstrual periods become heavier than usual. MAKE SURE YOU:  Understand these instructions.  Will watch your condition.  Will get help right away if you are not doing well or get worse. Document Released: 06/13/2005 Document Revised: 06/18/2013 Document Reviewed: 02/18/2013 [facility] Patient Information 2015 [place]. This information is not intended to replace advice given to you by your health care provider. Make sure you discuss any questions you have with your health care provider.

## 2014-01-09 LAB — GC/CHLAMYDIA PROBE AMP
CT Probe RNA: NEGATIVE
GC Probe RNA: NEGATIVE

## 2014-01-10 ENCOUNTER — Encounter (HOSPITAL_COMMUNITY): Payer: Self-pay | Admitting: General Practice

## 2014-01-10 ENCOUNTER — Encounter: Payer: Self-pay | Admitting: Obstetrics & Gynecology

## 2014-01-10 ENCOUNTER — Inpatient Hospital Stay (HOSPITAL_COMMUNITY)
Admission: AD | Admit: 2014-01-10 | Discharge: 2014-01-10 | Disposition: A | Discharge status: Home / Self Care | Payer: 59 | Source: Ambulatory Visit | Attending: Obstetrics & Gynecology | Admitting: Obstetrics & Gynecology

## 2014-01-10 DIAGNOSIS — R112 Nausea with vomiting, unspecified: Secondary | ICD-10-CM | POA: Insufficient documentation

## 2014-01-10 DIAGNOSIS — R197 Diarrhea, unspecified: Secondary | ICD-10-CM | POA: Insufficient documentation

## 2014-01-10 DIAGNOSIS — R109 Unspecified abdominal pain: Secondary | ICD-10-CM | POA: Insufficient documentation

## 2014-01-10 DIAGNOSIS — F17200 Nicotine dependence, unspecified, uncomplicated: Secondary | ICD-10-CM | POA: Insufficient documentation

## 2014-01-10 DIAGNOSIS — N7013 Chronic salpingitis and oophoritis: Secondary | ICD-10-CM | POA: Insufficient documentation

## 2014-01-10 DIAGNOSIS — N730 Acute parametritis and pelvic cellulitis: Secondary | ICD-10-CM

## 2014-01-10 LAB — CBC
HCT: 34.4 % — ABNORMAL LOW (ref 36.0–46.0)
Hemoglobin: 11.7 g/dL — ABNORMAL LOW (ref 12.0–15.0)
MCH: 28.1 pg (ref 26.0–34.0)
MCHC: 34 g/dL (ref 30.0–36.0)
MCV: 82.5 fL (ref 78.0–100.0)
Platelets: 311 10*3/uL (ref 150–400)
RBC: 4.17 MIL/uL (ref 3.87–5.11)
RDW: 13.3 % (ref 11.5–15.5)
WBC: 5.9 10*3/uL (ref 4.0–10.5)

## 2014-01-10 LAB — COMPREHENSIVE METABOLIC PANEL
ALT: 10 U/L (ref 0–35)
AST: 21 U/L (ref 0–37)
Albumin: 3.4 g/dL — ABNORMAL LOW (ref 3.5–5.2)
Alkaline Phosphatase: 66 U/L (ref 39–117)
Anion gap: 11 (ref 5–15)
BUN: 6 mg/dL (ref 6–23)
CO2: 24 mEq/L (ref 19–32)
Calcium: 8.6 mg/dL (ref 8.4–10.5)
Chloride: 104 mEq/L (ref 96–112)
Creatinine, Ser: 0.82 mg/dL (ref 0.50–1.10)
GFR calc Af Amer: >90 mL/min (ref >90)
GFR calc non Af Amer: >90 mL/min (ref >90)
Glucose, Bld: 101 mg/dL — ABNORMAL HIGH (ref 70–99)
Potassium: 3.1 mEq/L — ABNORMAL LOW (ref 3.7–5.3)
Sodium: 139 mEq/L (ref 137–147)
Total Bilirubin: 0.3 mg/dL (ref 0.3–1.2)
Total Protein: 7.3 g/dL (ref 6.0–8.3)

## 2014-01-10 MED ORDER — ONDANSETRON 8 MG PO TBDP
8.0000 mg | ORAL_TABLET | ORAL | Status: AC
  Administered 2014-01-10: 8 mg via ORAL
  Filled 2014-01-10: qty 1

## 2014-01-10 MED ORDER — KETOROLAC TROMETHAMINE 60 MG/2ML IM SOLN
60.0000 mg | INTRAMUSCULAR | Status: AC
  Administered 2014-01-10: 60 mg via INTRAMUSCULAR
  Filled 2014-01-10: qty 2

## 2014-01-10 MED ORDER — ONDANSETRON 4 MG PO TBDP: 4.0000 mg | ORAL_TABLET | Freq: Four times a day (QID) | ORAL | Status: DC | PRN

## 2014-01-10 NOTE — MAU Note (Signed)
Pt presents to MAU with c/o abdominal pain/ cramping from her period, diarrhea, nausea, sob and feeling weak. PT was here two days ago with the same symptoms and stated that she was prescribed an antibiotic for PID. She appears to be in mild to moderate distress at this time with rapid shallow breathing and shaking due to pain holding a emesis bag.

## 2014-01-10 NOTE — Discharge Instructions (Signed)
Pelvic Inflammatory Disease °Pelvic inflammatory disease (PID) refers to an infection in some or all of the female organs. The infection can be in the uterus, ovaries, fallopian tubes, or the surrounding tissues in the pelvis. PID can cause abdominal or pelvic pain that comes on suddenly (acute pelvic pain). PID is a serious infection because it can lead to lasting (chronic) pelvic pain or the inability to have children (infertile).  °CAUSES  °The infection is often caused by the normal bacteria found in the vaginal tissues. PID may also be caused by an infection that is spread during sexual contact. PID can also occur following:  °· The birth of a baby.   °· A miscarriage.   °· An abortion.   °· Major pelvic surgery.   °· The use of an intrauterine device (IUD).   °· A sexual assault.   °RISK FACTORS °Certain factors can put a person at higher risk for PID, such as: °· Being younger than 25 years. °· Being sexually active at a young age. °· Using nonbarrier contraception. °· Having multiple sexual partners. °· Having sex with someone who has symptoms of a genital infection. °· Using oral contraception. °Other times, certain behaviors can increase the possibility of getting PID, such as: °· Having sex during your period. °· Using a vaginal douche. °· Having an intrauterine device (IUD) in place. °SYMPTOMS  °· Abdominal or pelvic pain.   °· Fever.   °· Chills.   °· Abnormal vaginal discharge. °· Abnormal uterine bleeding.   °· Unusual pain shortly after finishing your period. °DIAGNOSIS  °Your caregiver will choose some of the following methods to make a diagnosis, such as:  °· Performing a physical exam and history. A pelvic exam typically reveals a very tender uterus and surrounding pelvis.   °· Ordering laboratory tests including a pregnancy test, blood tests, and urine test.  °· Ordering cultures of the vagina and cervix to check for a sexually transmitted infection (STI). °· Performing an ultrasound.    °· Performing a laparoscopic procedure to look inside the pelvis.   °TREATMENT  °· Antibiotic medicines may be prescribed and taken by mouth.   °· Sexual partners may be treated when the infection is caused by a sexually transmitted disease (STD).   °· Hospitalization may be needed to give antibiotics intravenously. °· Surgery may be needed, but this is rare. °It may take weeks until you are completely well. If you are diagnosed with PID, you should also be checked for human immunodeficiency virus (HIV).   °HOME CARE INSTRUCTIONS  °· If given, take your antibiotics as directed. Finish the medicine even if you start to feel better.   °· Only take over-the-counter or prescription medicines for pain, discomfort, or fever as directed by your caregiver.   °· Do not have sexual intercourse until treatment is completed or as directed by your caregiver. If PID is confirmed, your recent sexual partner(s) will need treatment.   °· Keep your follow-up appointments. °SEEK MEDICAL CARE IF:  °· You have increased or abnormal vaginal discharge.   °· You need prescription medicine for your pain.   °· You vomit.   °· You cannot take your medicines.   °· Your partner has an STD.   °SEEK IMMEDIATE MEDICAL CARE IF:  °· You have a fever.   °· You have increased abdominal or pelvic pain.   °· You have chills.   °· You have pain when you urinate.   °· You are not better after 72 hours following treatment.   °MAKE SURE YOU:  °· Understand these instructions. °· Will watch your condition. °· Will get help right away if you are not doing well or get worse. °  Document Released: 06/13/2005 Document Revised: 10/08/2012 Document Reviewed: 06/09/2011 [facility] Patient Information 2015 [place]. This information is not intended to replace advice given to you by your health care provider. Make sure you discuss any questions you have with your health care provider.  Nausea and Vomiting Nausea is a sick feeling that often comes before  throwing up (vomiting). Vomiting is a reflex where stomach contents come out of your mouth. Vomiting can cause severe loss of body fluids (dehydration). Children and elderly adults can become dehydrated quickly, especially if they also have diarrhea. Nausea and vomiting are symptoms of a condition or disease. It is important to find the cause of your symptoms. CAUSES   Direct irritation of the stomach lining. This irritation can result from increased acid production (gastroesophageal reflux disease), infection, food poisoning, taking certain medicines (such as nonsteroidal anti-inflammatory drugs), alcohol use, or tobacco use.  Signals from the brain.These signals could be caused by a headache, heat exposure, an inner ear disturbance, increased pressure in the brain from injury, infection, a tumor, or a concussion, pain, emotional stimulus, or metabolic problems.  An obstruction in the gastrointestinal tract (bowel obstruction).  Illnesses such as diabetes, hepatitis, gallbladder problems, appendicitis, kidney problems, cancer, sepsis, atypical symptoms of a heart attack, or eating disorders.  Medical treatments such as chemotherapy and radiation.  Receiving medicine that makes you sleep (general anesthetic) during surgery. DIAGNOSIS Your caregiver may ask for tests to be done if the problems do not improve after a few days. Tests may also be done if symptoms are severe or if the reason for the nausea and vomiting is not clear. Tests may include:  Urine tests.  Blood tests.  Stool tests.  Cultures (to look for evidence of infection).  X-rays or other imaging studies. Test results can help your caregiver make decisions about treatment or the need for additional tests. TREATMENT You need to stay well hydrated. Drink frequently but in small amounts.You may wish to drink water, sports drinks, clear broth, or eat frozen ice pops or gelatin dessert to help stay hydrated.When you eat, eating  slowly may help prevent nausea.There are also some antinausea medicines that may help prevent nausea. HOME CARE INSTRUCTIONS   Take all medicine as directed by your caregiver.  If you do not have an appetite, do not force yourself to eat. However, you must continue to drink fluids.  If you have an appetite, eat a normal diet unless your caregiver tells you differently.  Eat a variety of complex carbohydrates (rice, wheat, potatoes, bread), lean meats, yogurt, fruits, and vegetables.  Avoid high-fat foods because they are more difficult to digest.  Drink enough water and fluids to keep your urine clear or pale yellow.  If you are dehydrated, ask your caregiver for specific rehydration instructions. Signs of dehydration may include:  Severe thirst.  Dry lips and mouth.  Dizziness.  Dark urine.  Decreasing urine frequency and amount.  Confusion.  Rapid breathing or pulse. SEEK IMMEDIATE MEDICAL CARE IF:   You have blood or brown flecks (like coffee grounds) in your vomit.  You have black or bloody stools.  You have a severe headache or stiff neck.  You are confused.  You have severe abdominal pain.  You have chest pain or trouble breathing.  You do not urinate at least once every 8 hours.  You develop cold or clammy skin.  You continue to vomit for longer than 24 to 48 hours.  You have a fever. MAKE SURE  YOU:   Understand these instructions.  Will watch your condition.  Will get help right away if you are not doing well or get worse. Document Released: 06/13/2005 Document Revised: 09/05/2011 Document Reviewed: 11/10/2010 [facility] Patient Information 2015 [place], [place]. This information is not intended to replace advice given to you by your health care provider. Make sure you discuss any questions you have with your health care provider.

## 2014-01-10 NOTE — MAU Provider Note (Signed)
Chief Complaint: Abdominal Pain   First Provider Initiated Contact with Patient 01/10/14 1126     SUBJECTIVE HPI: Andrea Daniels is a 23 y.o. G0P0 who presents to maternity admissions reporting increased abdominal pain, n/v/d with onset today.  She was seen in MAU 01/08/14 and treated for PID.  U/S showed left hydrosalpinx at that time.  She is taking Doxycycline as prescribed and after her morning dose with breakfast, she started to have increased abdominal pain, nausea, vomiting x1, diarrhea x4, chills, and heavier vaginal bleeding than yesterday.  She denies vaginal itching/burning, urinary symptoms, h/a, or dizziness.     Past Medical History  Diagnosis Date  . UTI (lower urinary tract infection)   . Asthma   . Gastritis   . Ovarian cyst    Past Surgical History  Procedure Laterality Date  . No past surgeries     History   Social History  . Marital Status: Single    Spouse Name: N/A    Number of Children: N/A  . Years of Education: 12   Occupational History  . Not on file.   Social History Main Topics  . Smoking status: Current Every Day Smoker -- 0.10 packs/day    Types: Cigarettes  . Smokeless tobacco: Never Used  . Alcohol Use: Yes     Comment: 1-5 cans a week  . Drug Use: No  . Sexual Activity: Yes    Birth Control/ Protection: None   Other Topics Concern  . Not on file   Social History Narrative  . No narrative on file   No current facility-administered medications on file prior to encounter.   Current Outpatient Prescriptions on File Prior to Encounter  Medication Sig Dispense Refill  . doxycycline (VIBRA-TABS) 100 MG tablet Take 1 tablet (100 mg total) by mouth 2 (two) times daily.  27 tablet  0  . albuterol (PROVENTIL HFA;VENTOLIN HFA) 108 (90 BASE) MCG/ACT inhaler Inhale 2 puffs into the lungs every 6 (six) hours as needed for wheezing.  1 Inhaler  2  . budesonide-formoterol (SYMBICORT) 160-4.5 MCG/ACT inhaler Inhale 2 puffs into the lungs 2 (two) times  daily.  1 Inhaler  3   No Known Allergies  ROS: Pertinent items in HPI  OBJECTIVE Blood pressure 135/82, pulse 85, temperature 97.7 F (36.5 C), resp. rate 20, last menstrual period 01/07/2014, SpO2 100.00%. GENERAL: Well-developed, well-nourished female in no acute distress.  HEENT: Normocephalic HEART: normal rate RESP: normal effort ABDOMEN: Soft, non-tender EXTREMITIES: Nontender, no edema NEURO: Alert and oriented SPECULUM EXAM: Deferred--done 01/08/14  LAB RESULTS Results for orders placed during the hospital encounter of 01/10/14 (from the past 24 hour(s))  CBC     Status: Abnormal   Collection Time    01/10/14 11:48 AM      Result Value Ref Range   WBC 5.9  4.0 - 10.5 K/uL   RBC 4.17  3.87 - 5.11 MIL/uL   Hemoglobin 11.7 (*) 12.0 - 15.0 g/dL   HCT 16.1 (*) 09.6 - 04.5 %   MCV 82.5  78.0 - 100.0 fL   MCH 28.1  26.0 - 34.0 pg   MCHC 34.0  30.0 - 36.0 g/dL   RDW 40.9  81.1 - 91.4 %   Platelets 311  150 - 400 K/uL  COMPREHENSIVE METABOLIC PANEL     Status: Abnormal   Collection Time    01/10/14 11:48 AM      Result Value Ref Range   Sodium 139  137 - 147  mEq/L   Potassium 3.1 (*) 3.7 - 5.3 mEq/L   Chloride 104  96 - 112 mEq/L   CO2 24  19 - 32 mEq/L   Glucose, Bld 101 (*) 70 - 99 mg/dL   BUN 6  6 - 23 mg/dL   Creatinine, Ser 5.780.82  0.50 - 1.10 mg/dL   Calcium 8.6  8.4 - 46.910.5 mg/dL   Total Protein 7.3  6.0 - 8.3 g/dL   Albumin 3.4 (*) 3.5 - 5.2 g/dL   AST 21  0 - 37 U/L   ALT 10  0 - 35 U/L   Alkaline Phosphatase 66  39 - 117 U/L   Total Bilirubin 0.3  0.3 - 1.2 mg/dL   GFR calc non Af Amer >90  >90 mL/min   GFR calc Af Amer >90  >90 mL/min   Anion gap 11  5 - 15    IMAGING [place] Transvaginal Non-ob  01/08/2014   CLINICAL DATA:  Pelvic pain, history of ovarian cysts  EXAM: TRANSABDOMINAL AND TRANSVAGINAL ULTRASOUND OF PELVIS  DOPPLER ULTRASOUND OF OVARIES  TECHNIQUE: Both transabdominal and transvaginal ultrasound examinations of the pelvis were performed.  Transabdominal technique was performed for global imaging of the pelvis including uterus, ovaries, adnexal regions, and pelvic cul-de-sac.  It was necessary to proceed with endovaginal exam following the transabdominal exam to visualize the endometrium. Color and duplex Doppler ultrasound was utilized to evaluate blood flow to the ovaries.  COMPARISON:  CT abdomen pelvis dated 08/26/2009  FINDINGS: Uterus  Measurements: 7.9 x 4.0 x 4.4 cm. No fibroids or other mass visualized.  Endometrium  Thickness: 9 mm.  No focal abnormality visualized.  Right ovary  Measurements: 8.5 x 5.0 x 6.2 cm. Dominant 7.5 x 4.7 x 6.7 cm simple cyst without septations, soft tissue components, or mural nodularity.  Left ovary  Measurements: 5.0 x 2.2 x 2.1 cm. Dilated tubular structure in the left adnexa, favored to reflect a hydrosalpinx.  Pulsed Doppler evaluation of both ovaries demonstrates normal low-resistance arterial and venous waveforms.  Other findings  No free fluid.  IMPRESSION: Dominant 7.5 cm simple cyst in the right ovary, almost certainly benign. Follow-up pelvic ultrasound is suggested in 6-12 weeks.  Suspected left hydrosalpinx.  No evidence of ovarian torsion.   Electronically Signed   By: Charline BillsSriyesh  Krishnan M.D.   On: 01/08/2014 15:22   [place] Pelvis Complete  01/08/2014   CLINICAL DATA:  Pelvic pain, history of ovarian cysts  EXAM: TRANSABDOMINAL AND TRANSVAGINAL ULTRASOUND OF PELVIS  DOPPLER ULTRASOUND OF OVARIES  TECHNIQUE: Both transabdominal and transvaginal ultrasound examinations of the pelvis were performed. Transabdominal technique was performed for global imaging of the pelvis including uterus, ovaries, adnexal regions, and pelvic cul-de-sac.  It was necessary to proceed with endovaginal exam following the transabdominal exam to visualize the endometrium. Color and duplex Doppler ultrasound was utilized to evaluate blood flow to the ovaries.  COMPARISON:  CT abdomen pelvis dated 08/26/2009  FINDINGS: Uterus   Measurements: 7.9 x 4.0 x 4.4 cm. No fibroids or other mass visualized.  Endometrium  Thickness: 9 mm.  No focal abnormality visualized.  Right ovary  Measurements: 8.5 x 5.0 x 6.2 cm. Dominant 7.5 x 4.7 x 6.7 cm simple cyst without septations, soft tissue components, or mural nodularity.  Left ovary  Measurements: 5.0 x 2.2 x 2.1 cm. Dilated tubular structure in the left adnexa, favored to reflect a hydrosalpinx.  Pulsed Doppler evaluation of both ovaries demonstrates normal low-resistance arterial and venous waveforms.  Other findings  No free fluid.  IMPRESSION: Dominant 7.5 cm simple cyst in the right ovary, almost certainly benign. Follow-up pelvic ultrasound is suggested in 6-12 weeks.  Suspected left hydrosalpinx.  No evidence of ovarian torsion.   Electronically Signed   By: Charline Bills M.D.   On: 01/08/2014 15:22   [place] Art/ven Flow Abd Pelv Doppler  01/08/2014   CLINICAL DATA:  Pelvic pain, history of ovarian cysts  EXAM: TRANSABDOMINAL AND TRANSVAGINAL ULTRASOUND OF PELVIS  DOPPLER ULTRASOUND OF OVARIES  TECHNIQUE: Both transabdominal and transvaginal ultrasound examinations of the pelvis were performed. Transabdominal technique was performed for global imaging of the pelvis including uterus, ovaries, adnexal regions, and pelvic cul-de-sac.  It was necessary to proceed with endovaginal exam following the transabdominal exam to visualize the endometrium. Color and duplex Doppler ultrasound was utilized to evaluate blood flow to the ovaries.  COMPARISON:  CT abdomen pelvis dated 08/26/2009  FINDINGS: Uterus  Measurements: 7.9 x 4.0 x 4.4 cm. No fibroids or other mass visualized.  Endometrium  Thickness: 9 mm.  No focal abnormality visualized.  Right ovary  Measurements: 8.5 x 5.0 x 6.2 cm. Dominant 7.5 x 4.7 x 6.7 cm simple cyst without septations, soft tissue components, or mural nodularity.  Left ovary  Measurements: 5.0 x 2.2 x 2.1 cm. Dilated tubular structure in the left adnexa, favored to  reflect a hydrosalpinx.  Pulsed Doppler evaluation of both ovaries demonstrates normal low-resistance arterial and venous waveforms.  Other findings  No free fluid.  IMPRESSION: Dominant 7.5 cm simple cyst in the right ovary, almost certainly benign. Follow-up pelvic ultrasound is suggested in 6-12 weeks.  Suspected left hydrosalpinx.  No evidence of ovarian torsion.   Electronically Signed   By: Charline Bills M.D.   On: 01/08/2014 15:22    ASSESSMENT 1. PID (acute pelvic inflammatory disease)   2. Nausea vomiting and diarrhea     PLAN Consult Dr Debroah Loop Zofran 8 mg ODT and Toradol 60 mg IM given in MAU with significant relief of symptoms Discharge home Zofran 4 mg ODT Rx Ibuprofen PRN for pain  Follow-up Information   Follow up with [facility]. (On 01/30/14, at 3 pm, with Dr Erin Fulling.  )    Specialty:  Obstetrics and Gynecology   Contact information:   [address] [place] [address] [phone]      Follow up with [facility] [place] MATERNITY ADMISSIONS. (As needed for emergencies)    Contact information:   [address] [id number] [place] [address] [phone]      Sharen Counter Certified Nurse-Midwife 01/10/2014  1:33 PM

## 2014-01-11 NOTE — MAU Provider Note (Signed)
Attestation of Attending Supervision of Advanced Practitioner (CNM/NP): Evaluation and management procedures were performed by the Advanced Practitioner under my supervision and collaboration.  I have reviewed the Advanced Practitioner's note and chart, and I agree with the management and plan.  HARRAWAY-SMITH, Moselle Rister 9:10 AM     

## 2014-01-22 ENCOUNTER — Encounter: Payer: Self-pay | Admitting: General Practice

## 2014-01-30 ENCOUNTER — Encounter: Payer: Self-pay | Admitting: Obstetrics & Gynecology

## 2014-01-30 ENCOUNTER — Ambulatory Visit (INDEPENDENT_AMBULATORY_CARE_PROVIDER_SITE_OTHER): Payer: 59 | Admitting: Obstetrics & Gynecology

## 2014-01-30 VITALS — BP 146/86 | Ht 59.0 in | Wt 147.6 lb

## 2014-01-30 DIAGNOSIS — N83209 Unspecified ovarian cyst, unspecified side: Secondary | ICD-10-CM

## 2014-01-30 NOTE — Progress Notes (Signed)
Subjective:     Patient ID: Andrea Daniels, female   DOB: 11/04/1990, 23 y.o.   MRN: [id number]  HPI Pt was seen in the MAU and was treated for PID and 7.5cm simple cyst.  She reports that her sx have almost resolved but, she still feels some mild cramping.  She is taking only Tylenol for the pain.    Review of Systems     Objective:   Physical Exam BP 146/86  Ht 4\' 11"  (1.499 m)  Wt 147 lb 9.6 oz (66.951 kg)  BMI 29.80 kg/m2  LMP 01/07/2014 Pt in NAD ABD; soft, NT; ND    01/08/2014 CLINICAL DATA: Pelvic pain, history of ovarian cysts  EXAM:  TRANSABDOMINAL AND TRANSVAGINAL ULTRASOUND OF PELVIS  DOPPLER ULTRASOUND OF OVARIES  TECHNIQUE:  Both transabdominal and transvaginal ultrasound examinations of the  pelvis were performed. Transabdominal technique was performed for  global imaging of the pelvis including uterus, ovaries, adnexal  regions, and pelvic cul-de-sac.  It was necessary to proceed with endovaginal exam following the  transabdominal exam to visualize the endometrium. Color and duplex  Doppler ultrasound was utilized to evaluate blood flow to the  ovaries.  COMPARISON: CT abdomen pelvis dated 08/26/2009  FINDINGS:  Uterus  Measurements: 7.9 x 4.0 x 4.4 cm. No fibroids or other mass  visualized.  Endometrium  Thickness: 9 mm. No focal abnormality visualized.  Right ovary  Measurements: 8.5 x 5.0 x 6.2 cm. Dominant 7.5 x 4.7 x 6.7 cm simple  cyst without septations, soft tissue components, or mural  nodularity.  Left ovary  Measurements: 5.0 x 2.2 x 2.1 cm. Dilated tubular structure in the  left adnexa, favored to reflect a hydrosalpinx.  Pulsed Doppler evaluation of both ovaries demonstrates normal  low-resistance arterial and venous waveforms.  Other findings  No free fluid.  IMPRESSION:  Dominant 7.5 cm simple cyst in the right ovary, almost certainly  benign. Follow-up pelvic ultrasound is suggested in 6-12 weeks.  Suspected left hydrosalpinx.  No  evidence of ovarian torsion.   Assessment:     PID and simple ovarian cyst- sx markedly improved     Plan:     F/u pelvic sono in 4 week F/u in 3 months for Annual or sooner prn

## 2014-01-30 NOTE — Patient Instructions (Signed)
Ovarian Cyst An ovarian cyst is a fluid-filled sac that forms on an ovary. The ovaries are small organs that produce eggs in women. Various types of cysts can form on the ovaries. Most are not cancerous. Many do not cause problems, and they often go away on their own. Some may cause symptoms and require treatment. Common types of ovarian cysts include:  Functional cysts--These cysts may occur every month during the menstrual cycle. This is normal. The cysts usually go away with the next menstrual cycle if the woman does not get pregnant. Usually, there are no symptoms with a functional cyst.  Endometrioma cysts--These cysts form from the tissue that lines the uterus. They are also called "chocolate cysts" because they become filled with blood that turns brown. This type of cyst can cause pain in the lower abdomen during intercourse and with your menstrual period.  Cystadenoma cysts--This type develops from the cells on the outside of the ovary. These cysts can get very big and cause lower abdomen pain and pain with intercourse. This type of cyst can twist on itself, cut off its blood supply, and cause severe pain. It can also easily rupture and cause a lot of pain.  Dermoid cysts--This type of cyst is sometimes found in both ovaries. These cysts may contain different kinds of body tissue, such as skin, teeth, hair, or cartilage. They usually do not cause symptoms unless they get very big.  Theca lutein cysts--These cysts occur when too much of a certain hormone (human chorionic gonadotropin) is produced and overstimulates the ovaries to produce an egg. This is most common after procedures used to assist with the conception of a baby (in vitro fertilization). CAUSES   Fertility drugs can cause a condition in which multiple large cysts are formed on the ovaries. This is called ovarian hyperstimulation syndrome.  A condition called polycystic ovary syndrome can cause hormonal imbalances that can lead to  nonfunctional ovarian cysts. SIGNS AND SYMPTOMS  Many ovarian cysts do not cause symptoms. If symptoms are present, they may include:  Pelvic pain or pressure.  Pain in the lower abdomen.  Pain during sexual intercourse.  Increasing girth (swelling) of the abdomen.  Abnormal menstrual periods.  Increasing pain with menstrual periods.  Stopping having menstrual periods without being pregnant. DIAGNOSIS  These cysts are commonly found during a routine or annual pelvic exam. Tests may be ordered to find out more about the cyst. These tests may include:  Ultrasound.  X-ray of the pelvis.  CT scan.  MRI.  Blood tests. TREATMENT  Many ovarian cysts go away on their own without treatment. Your health care provider may want to check your cyst regularly for 2-3 months to see if it changes. For women in menopause, it is particularly important to monitor a cyst closely because of the higher rate of ovarian cancer in menopausal women. When treatment is needed, it may include any of the following:  A procedure to drain the cyst (aspiration). This may be done using a long needle and ultrasound. It can also be done through a laparoscopic procedure. This involves using a thin, lighted tube with a tiny camera on the end (laparoscope) inserted through a small incision.  Surgery to remove the whole cyst. This may be done using laparoscopic surgery or an open surgery involving a larger incision in the lower abdomen.  Hormone treatment or birth control pills. These methods are sometimes used to help dissolve a cyst. HOME CARE INSTRUCTIONS   Only take over-the-counter   or prescription medicines as directed by your health care provider.  Follow up with your health care provider as directed.  Get regular pelvic exams and Pap tests. SEEK MEDICAL CARE IF:   Your periods are late, irregular, or painful, or they stop.  Your pelvic pain or abdominal pain does not go away.  Your abdomen becomes  larger or swollen.  You have pressure on your bladder or trouble emptying your bladder completely.  You have pain during sexual intercourse.  You have feelings of fullness, pressure, or discomfort in your stomach.  You lose weight for no apparent reason.  You feel generally ill.  You become constipated.  You lose your appetite.  You develop acne.  You have an increase in body and facial hair.  You are gaining weight, without changing your exercise and eating habits.  You think you are pregnant. SEEK IMMEDIATE MEDICAL CARE IF:   You have increasing abdominal pain.  You feel sick to your stomach (nauseous), and you throw up (vomit).  You develop a fever that comes on suddenly.  You have abdominal pain during a bowel movement.  Your menstrual periods become heavier than usual. MAKE SURE YOU:  Understand these instructions.  Will watch your condition.  Will get help right away if you are not doing well or get worse. Document Released: 06/13/2005 Document Revised: 06/18/2013 Document Reviewed: 02/18/2013 [employer] Patient Information 2015 [employer]. This information is not intended to replace advice given to you by your health care provider. Make sure you discuss any questions you have with your health care provider.  

## 2014-02-27 ENCOUNTER — Telehealth: Payer: Self-pay

## 2014-02-27 ENCOUNTER — Ambulatory Visit (HOSPITAL_COMMUNITY)
Admission: RE | Admit: 2014-02-27 | Discharge: 2014-02-27 | Disposition: A | Discharge status: Home / Self Care | Payer: 59 | Source: Ambulatory Visit | Attending: Obstetrics & Gynecology | Admitting: Obstetrics & Gynecology

## 2014-02-27 DIAGNOSIS — N83209 Unspecified ovarian cyst, unspecified side: Secondary | ICD-10-CM | POA: Diagnosis not present

## 2014-02-27 NOTE — Telephone Encounter (Signed)
Message copied by Louanna Raw on Thu Feb 27, 2014  1:56 PM ------      Message from: Willodean Rosenthal      Created: Thu Feb 27, 2014 10:57 AM       Please call pt. Her previously diagnosed ovarian cyst has resolved.  She may f/u for annual as prev discussed.            Thx,      clh-S  ------

## 2014-02-27 NOTE — Telephone Encounter (Signed)
Attempted to call patient regarding results. Both phone numbers disconnected. Will send letter.

## 2014-03-04 ENCOUNTER — Encounter: Payer: Self-pay | Admitting: General Practice

## 2014-03-11 ENCOUNTER — Emergency Department (HOSPITAL_COMMUNITY): Payer: 59

## 2014-03-11 ENCOUNTER — Encounter (HOSPITAL_COMMUNITY): Payer: Self-pay | Admitting: Emergency Medicine

## 2014-03-11 ENCOUNTER — Emergency Department (HOSPITAL_COMMUNITY)
Admission: EM | Admit: 2014-03-11 | Discharge: 2014-03-11 | Disposition: A | Discharge status: Home / Self Care | Payer: 59 | Attending: Emergency Medicine | Admitting: Emergency Medicine

## 2014-03-11 DIAGNOSIS — N730 Acute parametritis and pelvic cellulitis: Secondary | ICD-10-CM | POA: Diagnosis not present

## 2014-03-11 DIAGNOSIS — R1033 Periumbilical pain: Secondary | ICD-10-CM | POA: Insufficient documentation

## 2014-03-11 DIAGNOSIS — R110 Nausea: Secondary | ICD-10-CM | POA: Insufficient documentation

## 2014-03-11 DIAGNOSIS — Z79899 Other long term (current) drug therapy: Secondary | ICD-10-CM | POA: Diagnosis not present

## 2014-03-11 DIAGNOSIS — Z3202 Encounter for pregnancy test, result negative: Secondary | ICD-10-CM | POA: Insufficient documentation

## 2014-03-11 DIAGNOSIS — N83209 Unspecified ovarian cyst, unspecified side: Secondary | ICD-10-CM | POA: Diagnosis not present

## 2014-03-11 DIAGNOSIS — J45909 Unspecified asthma, uncomplicated: Secondary | ICD-10-CM | POA: Insufficient documentation

## 2014-03-11 DIAGNOSIS — Z87440 Personal history of urinary (tract) infections: Secondary | ICD-10-CM | POA: Insufficient documentation

## 2014-03-11 DIAGNOSIS — Z8719 Personal history of other diseases of the digestive system: Secondary | ICD-10-CM | POA: Diagnosis not present

## 2014-03-11 DIAGNOSIS — N83202 Unspecified ovarian cyst, left side: Secondary | ICD-10-CM

## 2014-03-11 DIAGNOSIS — N898 Other specified noninflammatory disorders of vagina: Secondary | ICD-10-CM | POA: Insufficient documentation

## 2014-03-11 DIAGNOSIS — Z87891 Personal history of nicotine dependence: Secondary | ICD-10-CM | POA: Insufficient documentation

## 2014-03-11 DIAGNOSIS — R1030 Lower abdominal pain, unspecified: Secondary | ICD-10-CM

## 2014-03-11 DIAGNOSIS — R6883 Chills (without fever): Secondary | ICD-10-CM | POA: Insufficient documentation

## 2014-03-11 LAB — COMPREHENSIVE METABOLIC PANEL
ALT: 13 U/L (ref 0–35)
AST: 19 U/L (ref 0–37)
Albumin: 3.9 g/dL (ref 3.5–5.2)
Alkaline Phosphatase: 58 U/L (ref 39–117)
Anion gap: 11 (ref 5–15)
BUN: 9 mg/dL (ref 6–23)
CO2: 21 mEq/L (ref 19–32)
Calcium: 9.5 mg/dL (ref 8.4–10.5)
Chloride: 104 mEq/L (ref 96–112)
Creatinine, Ser: 0.81 mg/dL (ref 0.50–1.10)
GFR calc Af Amer: >90 mL/min (ref >90)
GFR calc non Af Amer: >90 mL/min (ref >90)
Glucose, Bld: 88 mg/dL (ref 70–99)
Potassium: 3.9 mEq/L (ref 3.7–5.3)
Sodium: 136 mEq/L — ABNORMAL LOW (ref 137–147)
Total Bilirubin: 0.5 mg/dL (ref 0.3–1.2)
Total Protein: 8 g/dL (ref 6.0–8.3)

## 2014-03-11 LAB — URINALYSIS, ROUTINE W REFLEX MICROSCOPIC
Bilirubin Urine: NEGATIVE
Glucose, UA: NEGATIVE mg/dL
Ketones, ur: NEGATIVE mg/dL
Leukocytes, UA: NEGATIVE
Nitrite: NEGATIVE
Protein, ur: NEGATIVE mg/dL
Specific Gravity, Urine: 1.023 (ref 1.005–1.030)
Urobilinogen, UA: 0.2 mg/dL (ref 0.0–1.0)
pH: 7.5 (ref 5.0–8.0)

## 2014-03-11 LAB — CBC WITH DIFFERENTIAL/PLATELET
Basophils Absolute: 0.1 10*3/uL (ref 0.0–0.1)
Basophils Relative: 1 % (ref 0–1)
Eosinophils Absolute: 0.3 10*3/uL (ref 0.0–0.7)
Eosinophils Relative: 6 % — ABNORMAL HIGH (ref 0–5)
HCT: 37.7 % (ref 36.0–46.0)
Hemoglobin: 12.6 g/dL (ref 12.0–15.0)
Lymphocytes Relative: 50 % — ABNORMAL HIGH (ref 12–46)
Lymphs Abs: 2.4 10*3/uL (ref 0.7–4.0)
MCH: 27 pg (ref 26.0–34.0)
MCHC: 33.4 g/dL (ref 30.0–36.0)
MCV: 80.7 fL (ref 78.0–100.0)
Monocytes Absolute: 0.3 10*3/uL (ref 0.1–1.0)
Monocytes Relative: 6 % (ref 3–12)
Neutro Abs: 1.8 10*3/uL (ref 1.7–7.7)
Neutrophils Relative %: 37 % — ABNORMAL LOW (ref 43–77)
Platelets: 333 10*3/uL (ref 150–400)
RBC: 4.67 MIL/uL (ref 3.87–5.11)
RDW: 12.6 % (ref 11.5–15.5)
WBC: 4.8 10*3/uL (ref 4.0–10.5)

## 2014-03-11 LAB — WET PREP, GENITAL
Clue Cells Wet Prep HPF POC: NONE SEEN
Trich, Wet Prep: NONE SEEN
Yeast Wet Prep HPF POC: NONE SEEN

## 2014-03-11 LAB — URINE MICROSCOPIC-ADD ON

## 2014-03-11 LAB — PREGNANCY, URINE: Preg Test, Ur: NEGATIVE

## 2014-03-11 MED ORDER — DOXYCYCLINE HYCLATE 50 MG PO CAPS
100.0000 mg | ORAL_CAPSULE | Freq: Two times a day (BID) | ORAL | Status: DC
Start: 2014-03-11 — End: 2014-04-15

## 2014-03-11 MED ORDER — LIDOCAINE HCL 1 % IJ SOLN
INTRAMUSCULAR | Status: AC
  Filled 2014-03-11: qty 20

## 2014-03-11 MED ORDER — MORPHINE SULFATE 2 MG/ML IJ SOLN
2.0000 mg | Freq: Once | INTRAMUSCULAR | Status: AC
  Administered 2014-03-11: 2 mg via INTRAVENOUS
  Filled 2014-03-11: qty 1

## 2014-03-11 MED ORDER — CEFTRIAXONE SODIUM 250 MG IJ SOLR
250.0000 mg | Freq: Once | INTRAMUSCULAR | Status: AC
  Administered 2014-03-11: 250 mg via INTRAMUSCULAR
  Filled 2014-03-11: qty 250

## 2014-03-11 MED ORDER — HYDROCODONE-ACETAMINOPHEN 5-325 MG PO TABS: 1.0000 | ORAL_TABLET | Freq: Four times a day (QID) | ORAL | Status: DC | PRN

## 2014-03-11 MED ORDER — ONDANSETRON HCL 4 MG/2ML IJ SOLN
4.0000 mg | Freq: Once | INTRAMUSCULAR | Status: AC
  Administered 2014-03-11: 4 mg via INTRAVENOUS
  Filled 2014-03-11: qty 2

## 2014-03-11 NOTE — ED Provider Notes (Signed)
I saw and evaluated the patient, reviewed the resident's note and I agree with the findings and plan.   EKG Interpretation None     Patient here with abdominal pain and vaginal discharge. Workup consistent with PID. Patient will be treated for this. No surgical abdomen present  Toy Baker, MD 03/11/14 1606

## 2014-03-11 NOTE — ED Provider Notes (Signed)
CSN: [id number]     Arrival date & time 03/11/14  1300 History   First MD Initiated Contact with Patient 03/11/14 1317     Chief Complaint  Patient presents with  . Abdominal Pain  . Nausea  . Diarrhea     (Consider location/radiation/quality/duration/timing/severity/associated sxs/prior Treatment) HPI Comments: The patient presents with suprapubic and periumbilical pain beginning today.  He is described as sharp/pressure and does not radiate.  Reports chills, but no fevers. Associated with nausea without vomiting.  Reports the pain feels similar to previous episodes of gastritis, as well as menstrual cramping.  History of PID with similar symptoms.  LMP one month ago; not currently on birth control.  Description reports that 8 vaginal discharge for several days without vaginal bleeding, itching, or pain.  Also has history of ovarian cyst.   Patient is a 23 y.o. female presenting with abdominal pain.  Abdominal Pain Pain location:  Suprapubic and periumbilical Pain quality: pressure, sharp and tugging   Pain radiates to:  Does not radiate Onset quality:  Sudden Duration:  3 hours Progression:  Improving Chronicity:  New Context: not retching and not sick contacts   Relieved by:  None tried Worsened by:  Movement Ineffective treatments:  None tried Associated symptoms: chills, nausea and vaginal discharge   Associated symptoms: no constipation, no diarrhea, no dysuria, no fever, no hematochezia, no hematuria, no vaginal bleeding and no vomiting   Vaginal discharge:    Quality:  Thick and white   Duration:  2 days   Timing:  Intermittent   Chronicity:  New   Past Medical History  Diagnosis Date  . UTI (lower urinary tract infection)   . Asthma   . Gastritis   . Ovarian cyst    Past Surgical History  Procedure Laterality Date  . No past surgeries     Family History  Problem Relation Age of Onset  . Hyperlipidemia Other   . Hypertension Other   . Arthritis Other   .  Cancer Other     breast cancer  . Hypertension Other   . Diabetes Other    History  Substance Use Topics  . Smoking status: Former Smoker -- 0.10 packs/day    Types: Cigarettes  . Smokeless tobacco: Never Used  . Alcohol Use: Yes     Comment: 1-5 cans a week   OB History   Grav Para Term Preterm Abortions TAB SAB Ect Mult Living   0              Review of Systems  Constitutional: Positive for chills. Negative for fever.  Eyes: Negative for redness and visual disturbance.  Gastrointestinal: Positive for nausea and abdominal pain. Negative for vomiting, diarrhea, constipation, blood in stool and hematochezia.  Genitourinary: Positive for vaginal discharge. Negative for dysuria, frequency, hematuria, flank pain and vaginal bleeding.  Musculoskeletal: Negative for arthralgias.  Skin: Negative for rash.      Allergies  Review of patient's allergies indicates no known allergies.  Home Medications   Prior to Admission medications   Medication Sig Start Date End Date Taking? Authorizing Provider  acetaminophen (TYLENOL) 325 MG tablet Take 650 mg by mouth every 6 (six) hours as needed.   Yes Historical Provider, MD  albuterol (PROVENTIL HFA;VENTOLIN HFA) 108 (90 BASE) MCG/ACT inhaler Inhale 2 puffs into the lungs every 6 (six) hours as needed for wheezing. 08/16/12  Yes Corwin Levins, MD  doxycycline (VIBRAMYCIN) 50 MG capsule Take 2 capsules (100 mg total) by  mouth 2 (two) times daily. 03/11/14   Jamal Collin, MD  HYDROcodone-acetaminophen (NORCO) 5-325 MG per tablet Take 1 tablet by mouth every 6 (six) hours as needed for moderate pain. 03/11/14   Jamal Collin, MD   BP 136/68  Pulse 84  Temp(Src) 98.4 F (36.9 C) (Oral)  Resp 18  SpO2 100%  LMP 02/12/2014 Physical Exam  Constitutional: She appears well-developed and well-nourished. No distress.  Eyes: Pupils are equal, round, and reactive to light.  Cardiovascular: Normal rate and regular rhythm.   No murmur  heard. Pulmonary/Chest: Effort normal and breath sounds normal. No respiratory distress.  Abdominal: Soft. She exhibits no mass. There is tenderness. There is no rebound and no guarding.  Diffuse tenderness greatest in the suprapubic area  Genitourinary: Vagina normal.  Speculum Exam: Ext genitalia: wnl; Vaginal discharge: bloody mucus; Cervix: closed, no petechia.   Bimanual Exam: Cervical motion tenderness with b/l adnexal tenderness; No Vaginal wall defects  Neurological: She is alert.  Skin: Skin is warm. No rash noted.    ED Course  Procedures (including critical care time) Labs Review Labs Reviewed  CBC WITH DIFFERENTIAL - Abnormal; Notable for the following:    Neutrophils Relative % 37 (*)    Lymphocytes Relative 50 (*)    Eosinophils Relative 6 (*)    All other components within normal limits  COMPREHENSIVE METABOLIC PANEL - Abnormal; Notable for the following:    Sodium 136 (*)    All other components within normal limits  URINALYSIS, ROUTINE W REFLEX MICROSCOPIC - Abnormal; Notable for the following:    Hgb urine dipstick MODERATE (*)    All other components within normal limits  URINE MICROSCOPIC-ADD ON - Abnormal; Notable for the following:    Bacteria, UA FEW (*)    All other components within normal limits  WET PREP, GENITAL  GC/CHLAMYDIA PROBE AMP  PREGNANCY, URINE    Imaging Review US Transvaginal Non-ob  03/11/2014   CLINICAL DATA:  Abdominal pain  EXAM: TRANSABDOMINAL AND TRANSVAGINAL ULTRASOUND OF PELVIS  TECHNIQUE: Both transabdominal and transvaginal ultrasound examinations of the pelvis were performed. Transabdominal technique was performed for global imaging of the pelvis including uterus, ovaries, adnexal regions, and pelvic cul-de-sac. It was necessary to proceed with endovaginal exam following the transabdominal exam to visualize the adnexa.  COMPARISON:  02/27/2014  FINDINGS: Uterus  Measurements: 9.4 x 4.6 x 5.5 cm. No fibroids or other mass  visualized.  Endometrium  Thickness: 11 mm.  No focal abnormality visualized.  Right ovary  Measurements: 5.0 x 2.7 x 2.2 cm. Normal appearance/no adnexal mass.  Left ovary  Measurements: 7.1 x 3.4 x 3.1 cm. There is an irregular cystic area within the left ovary measuring 4.4 x 1.6 x 1.7 cm. There is no internal vascularity. Internal echoes and thin septations are noted within the abnormality. Left hydrosalpinx is again present.  Other findings  Moderate free fluid is present.  IMPRESSION: Complex irregular cystic lesion in the left ovary likely represents an involuting or hemorrhagic cyst.  Left hydrosalpinx.   Electronically Signed   By: Maryclare Bean M.D.   On: 03/11/2014 15:17   US Pelvis Complete  03/11/2014   CLINICAL DATA:  Abdominal pain  EXAM: TRANSABDOMINAL AND TRANSVAGINAL ULTRASOUND OF PELVIS  TECHNIQUE: Both transabdominal and transvaginal ultrasound examinations of the pelvis were performed. Transabdominal technique was performed for global imaging of the pelvis including uterus, ovaries, adnexal regions, and pelvic cul-de-sac. It was necessary to proceed with endovaginal exam following  the transabdominal exam to visualize the adnexa.  COMPARISON:  02/27/2014  FINDINGS: Uterus  Measurements: 9.4 x 4.6 x 5.5 cm. No fibroids or other mass visualized.  Endometrium  Thickness: 11 mm.  No focal abnormality visualized.  Right ovary  Measurements: 5.0 x 2.7 x 2.2 cm. Normal appearance/no adnexal mass.  Left ovary  Measurements: 7.1 x 3.4 x 3.1 cm. There is an irregular cystic area within the left ovary measuring 4.4 x 1.6 x 1.7 cm. There is no internal vascularity. Internal echoes and thin septations are noted within the abnormality. Left hydrosalpinx is again present.  Other findings  Moderate free fluid is present.  IMPRESSION: Complex irregular cystic lesion in the left ovary likely represents an involuting or hemorrhagic cyst.  Left hydrosalpinx.   Electronically Signed   By: Maryclare Bean M.D.   On:  03/11/2014 15:17     EKG Interpretation None      MDM   Final diagnoses:  Acute PID (pelvic inflammatory disease)  Lower abdominal pain  Cyst of left ovary   Patient presented with lower abdominal pain times one day with a history of PID and ovarian cyst.  WBC within normal limits.  UA negative for UTI.  Negative urine pregnancy test.  This exam produced cervical motion tenderness with bilateral adnexal tenderness concerning for PID. Pelvic ultrasound showed left ovarian cyst. Pain likely due to PID vs ovarian cyst. Patient treated with CTX 250 mg IM and given Rx for Doxy 100 mg BID x 2 weeks for presumptive PID treatment; GC/Chlamydia swab obtained, and patient possible with women's health clinic for results and test of cure if positive.  Patient given Norco for pain relief    Jamal Collin, MD 03/11/14 [phone]

## 2014-03-11 NOTE — Discharge Instructions (Signed)
Pelvic Inflammatory Disease °Pelvic inflammatory disease (PID) refers to an infection in some or all of the female organs. The infection can be in the uterus, ovaries, fallopian tubes, or the surrounding tissues in the pelvis. PID can cause abdominal or pelvic pain that comes on suddenly (acute pelvic pain). PID is a serious infection because it can lead to lasting (chronic) pelvic pain or the inability to have children (infertile).  °CAUSES  °The infection is often caused by the normal bacteria found in the vaginal tissues. PID may also be caused by an infection that is spread during sexual contact. PID can also occur following:  °· The birth of a baby.   °· A miscarriage.   °· An abortion.   °· Major pelvic surgery.   °· The use of an intrauterine device (IUD).   °· A sexual assault.   °RISK FACTORS °Certain factors can put a person at higher risk for PID, such as: °· Being younger than 25 years. °· Being sexually active at a young age. °· Using nonbarrier contraception. °· Having multiple sexual partners. °· Having sex with someone who has symptoms of a genital infection. °· Using oral contraception. °Other times, certain behaviors can increase the possibility of getting PID, such as: °· Having sex during your period. °· Using a vaginal douche. °· Having an intrauterine device (IUD) in place. °SYMPTOMS  °· Abdominal or pelvic pain.   °· Fever.   °· Chills.   °· Abnormal vaginal discharge. °· Abnormal uterine bleeding.   °· Unusual pain shortly after finishing your period. °DIAGNOSIS  °Your caregiver will choose some of the following methods to make a diagnosis, such as:  °· Performing a physical exam and history. A pelvic exam typically reveals a very tender uterus and surrounding pelvis.   °· Ordering laboratory tests including a pregnancy test, blood tests, and urine test.  °· Ordering cultures of the vagina and cervix to check for a sexually transmitted infection (STI). °· Performing an ultrasound.    °· Performing a laparoscopic procedure to look inside the pelvis.   °TREATMENT  °· Antibiotic medicines may be prescribed and taken by mouth.   °· Sexual partners may be treated when the infection is caused by a sexually transmitted disease (STD).   °· Hospitalization may be needed to give antibiotics intravenously. °· Surgery may be needed, but this is rare. °It may take weeks until you are completely well. If you are diagnosed with PID, you should also be checked for human immunodeficiency virus (HIV).   °HOME CARE INSTRUCTIONS  °· If given, take your antibiotics as directed. Finish the medicine even if you start to feel better.   °· Only take over-the-counter or prescription medicines for pain, discomfort, or fever as directed by your caregiver.   °· Do not have sexual intercourse until treatment is completed or as directed by your caregiver. If PID is confirmed, your recent sexual partner(s) will need treatment.   °· Keep your follow-up appointments. °SEEK MEDICAL CARE IF:  °· You have increased or abnormal vaginal discharge.   °· You need prescription medicine for your pain.   °· You vomit.   °· You cannot take your medicines.   °· Your partner has an STD.   °SEEK IMMEDIATE MEDICAL CARE IF:  °· You have a fever.   °· You have increased abdominal or pelvic pain.   °· You have chills.   °· You have pain when you urinate.   °· You are not better after 72 hours following treatment.   °MAKE SURE YOU:  °· Understand these instructions. °· Will watch your condition. °· Will get help right away if you are not doing well or get worse. °  Document Released: 06/13/2005 Document Revised: 10/08/2012 Document Reviewed: 06/09/2011 °[employer]® Patient Information ©2015 [employer]. This information is not intended to replace advice given to you by your health care provider. Make sure you discuss any questions you have with your health care provider. ° °

## 2014-03-11 NOTE — ED Notes (Signed)
Patient presents from home via [facility] for periumbilical pain "constant cramping", with nausea, "hot flashes". Hx of gastritis. Patient reports pain is similar to same.   VS: 135/108 BP, 92 HR, 99% RA.

## 2014-03-11 NOTE — ED Notes (Signed)
Pt is aware that we need a urine sample 

## 2014-03-11 NOTE — ED Notes (Signed)
Bed: LK44 Expected date:  Expected time:  Means of arrival:  Comments: EMS- abdominal pain, Hx of gasteritis

## 2014-03-12 LAB — GC/CHLAMYDIA PROBE AMP
CT Probe RNA: NEGATIVE
GC Probe RNA: NEGATIVE

## 2014-03-14 NOTE — ED Provider Notes (Signed)
I saw and evaluated the patient, reviewed the resident's note and I agree with the findings and plan.   EKG Interpretation None       Toy Baker, MD 03/14/14 2330

## 2014-03-16 ENCOUNTER — Encounter (HOSPITAL_COMMUNITY): Payer: Self-pay | Admitting: Emergency Medicine

## 2014-03-16 ENCOUNTER — Emergency Department (HOSPITAL_COMMUNITY)
Admission: EM | Admit: 2014-03-16 | Discharge: 2014-03-17 | Disposition: A | Discharge status: Home / Self Care | Payer: 59 | Attending: Emergency Medicine | Admitting: Emergency Medicine

## 2014-03-16 ENCOUNTER — Emergency Department (HOSPITAL_COMMUNITY)
Admission: EM | Admit: 2014-03-16 | Discharge: 2014-03-16 | Disposition: A | Discharge status: Home / Self Care | Payer: 59 | Attending: Emergency Medicine | Admitting: Emergency Medicine

## 2014-03-16 DIAGNOSIS — Z3202 Encounter for pregnancy test, result negative: Secondary | ICD-10-CM | POA: Diagnosis not present

## 2014-03-16 DIAGNOSIS — J45901 Unspecified asthma with (acute) exacerbation: Secondary | ICD-10-CM | POA: Diagnosis not present

## 2014-03-16 DIAGNOSIS — R1031 Right lower quadrant pain: Secondary | ICD-10-CM | POA: Diagnosis present

## 2014-03-16 DIAGNOSIS — R1110 Vomiting, unspecified: Secondary | ICD-10-CM | POA: Diagnosis not present

## 2014-03-16 DIAGNOSIS — Z87891 Personal history of nicotine dependence: Secondary | ICD-10-CM | POA: Diagnosis not present

## 2014-03-16 DIAGNOSIS — Z8719 Personal history of other diseases of the digestive system: Secondary | ICD-10-CM | POA: Diagnosis not present

## 2014-03-16 DIAGNOSIS — R102 Pelvic and perineal pain: Secondary | ICD-10-CM

## 2014-03-16 DIAGNOSIS — R112 Nausea with vomiting, unspecified: Secondary | ICD-10-CM | POA: Diagnosis not present

## 2014-03-16 DIAGNOSIS — N949 Unspecified condition associated with female genital organs and menstrual cycle: Secondary | ICD-10-CM | POA: Diagnosis not present

## 2014-03-16 DIAGNOSIS — J45909 Unspecified asthma, uncomplicated: Secondary | ICD-10-CM | POA: Diagnosis not present

## 2014-03-16 DIAGNOSIS — R109 Unspecified abdominal pain: Secondary | ICD-10-CM | POA: Insufficient documentation

## 2014-03-16 DIAGNOSIS — Z792 Long term (current) use of antibiotics: Secondary | ICD-10-CM | POA: Insufficient documentation

## 2014-03-16 DIAGNOSIS — Z87440 Personal history of urinary (tract) infections: Secondary | ICD-10-CM | POA: Diagnosis not present

## 2014-03-16 DIAGNOSIS — R42 Dizziness and giddiness: Secondary | ICD-10-CM | POA: Insufficient documentation

## 2014-03-16 DIAGNOSIS — Z8742 Personal history of other diseases of the female genital tract: Secondary | ICD-10-CM | POA: Insufficient documentation

## 2014-03-16 DIAGNOSIS — Z79899 Other long term (current) drug therapy: Secondary | ICD-10-CM | POA: Insufficient documentation

## 2014-03-16 DIAGNOSIS — N730 Acute parametritis and pelvic cellulitis: Secondary | ICD-10-CM

## 2014-03-16 DIAGNOSIS — R1032 Left lower quadrant pain: Secondary | ICD-10-CM | POA: Insufficient documentation

## 2014-03-16 DIAGNOSIS — R1030 Lower abdominal pain, unspecified: Secondary | ICD-10-CM

## 2014-03-16 LAB — POC URINE PREG, ED
Preg Test, Ur: NEGATIVE
Preg Test, Ur: NEGATIVE

## 2014-03-16 MED ORDER — HYDROMORPHONE HCL 1 MG/ML IJ SOLN
1.0000 mg | Freq: Once | INTRAMUSCULAR | Status: AC
  Administered 2014-03-17: 1 mg via INTRAVENOUS
  Filled 2014-03-16: qty 1

## 2014-03-16 MED ORDER — AZITHROMYCIN 1 G PO PACK: 1.0000 g | PACK | Freq: Once | ORAL | Status: DC

## 2014-03-16 MED ORDER — ONDANSETRON HCL 4 MG/2ML IJ SOLN
4.0000 mg | Freq: Once | INTRAMUSCULAR | Status: AC
  Administered 2014-03-17: 4 mg via INTRAVENOUS
  Filled 2014-03-16: qty 2

## 2014-03-16 MED ORDER — ONDANSETRON 4 MG PO TBDP: 4.0000 mg | ORAL_TABLET | Freq: Three times a day (TID) | ORAL | Status: DC | PRN

## 2014-03-16 MED ORDER — HYDROMORPHONE HCL 2 MG/ML IJ SOLN: 2.0000 mg | Freq: Once | INTRAMUSCULAR | Status: DC

## 2014-03-16 MED ORDER — AZITHROMYCIN 250 MG PO TABS
250.0000 mg | ORAL_TABLET | Freq: Every day | ORAL | Status: DC
Start: 2014-03-16 — End: 2014-03-16

## 2014-03-16 MED ORDER — ONDANSETRON 8 MG PO TBDP: 8.0000 mg | ORAL_TABLET | Freq: Once | ORAL | Status: DC

## 2014-03-16 MED ORDER — ONDANSETRON 8 MG PO TBDP
8.0000 mg | ORAL_TABLET | Freq: Once | ORAL | Status: AC
  Administered 2014-03-16: 8 mg via ORAL
  Filled 2014-03-16: qty 1

## 2014-03-16 MED ORDER — AZITHROMYCIN 250 MG PO TABS: ORAL_TABLET | ORAL | Status: DC

## 2014-03-16 NOTE — ED Notes (Signed)
Per EMS: Pt from home.  Pt started taking 2 medications for PID yesterday.  Hx of asthma.  C/o dizziness and nausea w/ SOB since this morning.  States that she has been out of her inhaler x several months.  Pt had some wheezing on the rt side.  Was given 5 mg of albuterol en route.

## 2014-03-16 NOTE — Discharge Instructions (Signed)
Please read and follow all provided instructions.  Your diagnoses today include:  1. Non-intractable vomiting with nausea, vomiting of unspecified type   2. Lower abdominal pain     Tests performed today include:  Vital signs. See below for your results today.   Medications prescribed:   Azithromycin - antibiotic for respiratory infection  You have been prescribed an antibiotic medicine: take the entire course of medicine even if you are feeling better. Stopping early can cause the antibiotic not to work.   Zofran (ondansetron) - for nausea and vomiting  Take any prescribed medications only as directed.  Home care instructions:   Follow any educational materials contained in this packet.  Follow-up instructions: Please follow-up with your primary care provider in the next 7 days for further evaluation of your symptoms.    Return instructions:  SEEK IMMEDIATE MEDICAL ATTENTION IF:  The pain does not go away or becomes severe   A temperature above 101F develops   Repeated vomiting occurs (multiple episodes)   The pain becomes localized to portions of the abdomen. The right side could possibly be appendicitis. In an adult, the left lower portion of the abdomen could be colitis or diverticulitis.   Blood is being passed in stools or vomit (bright red or black tarry stools)   You develop chest pain, difficulty breathing, dizziness or fainting, or become confused, poorly responsive, or inconsolable (young children)  If you have any other emergent concerns regarding your health  Your vital signs today were: BP 134/89   Pulse 86   Temp(Src) 98.3 F (36.8 C) (Oral)   Resp 22   SpO2 100%   LMP 02/12/2014 If your blood pressure (bp) was elevated above 135/85 this visit, please have this repeated by your doctor within one month. --------------

## 2014-03-16 NOTE — ED Provider Notes (Signed)
CSN: [id number]     Arrival date & time 03/16/14  1148 History   First MD Initiated Contact with Patient 03/16/14 1243     Chief Complaint  Patient presents with  . Dizziness  . Shortness of Breath     (Consider location/radiation/quality/duration/timing/severity/associated sxs/prior Treatment) HPI Comments: Patient with visit to the emergency department on 09/15 with pelvic pain and diagnosis of PID and left ovarian cyst, filled doxycycline yesterday (09/19) and started taking -- presents with complaint of dizziness, nausea, and feeling poorly since this morning. Patient states that the symptoms started after taking the doxycycline. No previous reactions to doxy. She was also complaining of shortness of breath and was given albuterol in route. Patient has history of asthma. Patient states that she vomited 3 times this morning. She denies worsening abdominal pain, chest pain. She states that her pelvic pain has been gradually improving. She denies fever or back pain. The onset of this condition was acute. The course is constant. Aggravating factors: none. Alleviating factors: none.    Patient is a 23 y.o. female presenting with dizziness and shortness of breath. The history is provided by the patient and medical records.  Dizziness Associated symptoms: nausea, shortness of breath (resolved) and vomiting   Associated symptoms: no chest pain, no diarrhea and no headaches   Shortness of Breath Associated symptoms: abdominal pain (improved from recent visit), vomiting and wheezing (resolved)   Associated symptoms: no chest pain, no cough, no fever, no headaches, no rash and no sore throat     Past Medical History  Diagnosis Date  . UTI (lower urinary tract infection)   . Asthma   . Gastritis   . Ovarian cyst    Past Surgical History  Procedure Laterality Date  . No past surgeries     Family History  Problem Relation Age of Onset  . Hyperlipidemia Other   . Hypertension Other   .  Arthritis Other   . Cancer Other     breast cancer  . Hypertension Other   . Diabetes Other    History  Substance Use Topics  . Smoking status: Former Smoker -- 0.10 packs/day    Types: Cigarettes  . Smokeless tobacco: Never Used  . Alcohol Use: Yes     Comment: 1-5 cans a week   OB History   Grav Para Term Preterm Abortions TAB SAB Ect Mult Living   0              Review of Systems  Constitutional: Negative for fever.  HENT: Negative for rhinorrhea and sore throat.   Eyes: Negative for redness.  Respiratory: Positive for shortness of breath (resolved) and wheezing (resolved). Negative for cough.   Cardiovascular: Negative for chest pain.  Gastrointestinal: Positive for nausea, vomiting and abdominal pain (improved from recent visit). Negative for diarrhea.  Genitourinary: Negative for dysuria.  Musculoskeletal: Negative for myalgias.  Skin: Negative for rash.  Neurological: Positive for dizziness. Negative for headaches.    Allergies  Review of patient's allergies indicates no known allergies.  Home Medications   Prior to Admission medications   Medication Sig Start Date End Date Taking? Authorizing Provider  acetaminophen (TYLENOL) 325 MG tablet Take 650 mg by mouth every 6 (six) hours as needed.   Yes Historical Provider, MD  doxycycline (VIBRAMYCIN) 50 MG capsule Take 2 capsules (100 mg total) by mouth 2 (two) times daily. 03/11/14  Yes Jamal Collin, MD  HYDROcodone-acetaminophen (NORCO) 5-325 MG per tablet Take 1 tablet by  mouth every 6 (six) hours as needed for moderate pain. 03/11/14  Yes Jamal Collin, MD  albuterol (PROVENTIL HFA;VENTOLIN HFA) 108 (90 BASE) MCG/ACT inhaler Inhale 2 puffs into the lungs every 6 (six) hours as needed for wheezing. 08/16/12   Corwin Levins, MD   BP 134/89  Pulse 86  Temp(Src) 98.3 F (36.8 C) (Oral)  Resp 22  SpO2 100%  LMP 02/12/2014  Physical Exam  Nursing note and vitals reviewed. Constitutional: She appears  well-developed and well-nourished.  HENT:  Head: Normocephalic and atraumatic.  Eyes: Conjunctivae are normal. Right eye exhibits no discharge. Left eye exhibits no discharge.  Neck: Normal range of motion. Neck supple.  Cardiovascular: Normal rate, regular rhythm and normal heart sounds.   No murmur heard. Pulmonary/Chest: Effort normal and breath sounds normal. No respiratory distress. She has no wheezes. She has no rales.  Abdominal: Soft. She exhibits no distension. There is tenderness. There is no rebound and no guarding.  Mild lower abdominal pain  Musculoskeletal: She exhibits no edema and no tenderness.  Neurological: She is alert.  Skin: Skin is warm and dry.  Psychiatric: She has a normal mood and affect.    ED Course  Procedures (including critical care time) Labs Review Labs Reviewed - No data to display  Imaging Review No results found.   EKG Interpretation None      1:29 PM Patient seen and examined. Medications ordered.   Vital signs reviewed and are as follows: BP 134/89  Pulse 86  Temp(Src) 98.3 F (36.8 C) (Oral)  Resp 22  SpO2 100%  LMP 02/12/2014  3:49 PM Patient states that she feels much better with Zofran. She is drinking in room. We discussed how to proceed with antibiotics for decided to discontinue the doxycycline. I will give her #2  doses of doxycycline to take tomorrow and in 1 week. Also zofran for home.   Patient urged to return with worsening symptoms or other concerns. Patient verbalized understanding and agrees with plan.    MDM   Final diagnoses:  Non-intractable vomiting with nausea, vomiting of unspecified type  Lower abdominal pain   N/V: Suspect intolerance to the doxycycline given her history. Symptoms well controlled with zofran and she is drinking now. Abd is soft.   Lower abd pain/PID: patient noting improvement in symptoms since ED visit 5 days ago.     Renne Crigler, PA-C 03/16/14 1605

## 2014-03-16 NOTE — ED Provider Notes (Addendum)
CSN: [id number]     Arrival date & time 03/16/14  2144 History   First MD Initiated Contact with Patient 03/16/14 2208     Chief Complaint  Patient presents with  . Abdominal Pain    lower  . Emesis     (Consider location/radiation/quality/duration/timing/severity/associated sxs/prior Treatment) Patient is a 23 y.o. female presenting with abdominal pain and vomiting. The history is provided by the patient and a significant other. No language interpreter was used.  Abdominal Pain Pain location:  Suprapubic, LLQ and RLQ Pain quality: cramping   Pain radiates to:  Does not radiate Pain severity:  Moderate Onset quality:  Gradual Duration:  2 days Timing:  Intermittent Progression:  Waxing and waning Chronicity:  Recurrent Context: not previous surgeries, not recent illness, not sick contacts and not suspicious food intake   Context comment:  She is currently being treated for PID, however she had a reaction to doxycycline and was switched to azithromycin when she was seen today. She was also given a prescription for Zofran. She has filled neither of these prescriptions Relieved by:  Nothing Worsened by:  Nothing tried (Eating) Ineffective treatments: 3 Tylenol. Associated symptoms: vomiting   Emesis Associated symptoms: abdominal pain     Past Medical History  Diagnosis Date  . UTI (lower urinary tract infection)   . Asthma   . Gastritis   . Ovarian cyst    Past Surgical History  Procedure Laterality Date  . No past surgeries     Family History  Problem Relation Age of Onset  . Hyperlipidemia Other   . Hypertension Other   . Arthritis Other   . Cancer Other     breast cancer  . Hypertension Other   . Diabetes Other    History  Substance Use Topics  . Smoking status: Former Smoker -- 0.10 packs/day    Types: Cigarettes  . Smokeless tobacco: Never Used  . Alcohol Use: Yes     Comment: 1-5 cans a week   OB History   Grav Para Term Preterm Abortions TAB SAB Ect  Mult Living   0              Review of Systems  Gastrointestinal: Positive for vomiting and abdominal pain.      Allergies  Review of patient's allergies indicates no known allergies.  Home Medications   Prior to Admission medications   Medication Sig Start Date End Date Taking? Authorizing Provider  acetaminophen (TYLENOL) 325 MG tablet Take 650 mg by mouth every 6 (six) hours as needed.   Yes Historical Provider, MD  doxycycline (VIBRAMYCIN) 50 MG capsule Take 2 capsules (100 mg total) by mouth 2 (two) times daily. 03/11/14  Yes Jamal Collin, MD  HYDROcodone-acetaminophen (NORCO) 5-325 MG per tablet Take 1 tablet by mouth every 6 (six) hours as needed for moderate pain. 03/11/14  Yes Jamal Collin, MD  albuterol (PROVENTIL HFA;VENTOLIN HFA) 108 (90 BASE) MCG/ACT inhaler Inhale 2 puffs into the lungs every 6 (six) hours as needed for wheezing. 08/16/12   Corwin Levins, MD  azithromycin (ZITHROMAX) 250 MG tablet Take 4 tablets PO once on 09/28. 03/17/14   Toy Cookey, MD  oxyCODONE-acetaminophen (PERCOCET/ROXICET) 5-325 MG per tablet Take 1 tablet by mouth every 6 (six) hours as needed for moderate pain or severe pain. 03/17/14   Toy Cookey, MD  promethazine (PHENERGAN) 25 MG tablet Take 1 tablet (25 mg total) by mouth every 6 (six) hours as needed for nausea or  vomiting. 03/17/14   Toy Cookey, MD   BP 108/58  Pulse 70  Temp(Src) 98.7 F (37.1 C) (Oral)  Resp 18  SpO2 100%  LMP 03/14/2014 Physical Exam  Constitutional: She is oriented to person, place, and time. She appears well-developed and well-nourished. No distress.  HENT:  Head: Normocephalic and atraumatic.  Mouth/Throat: No oropharyngeal exudate.  Eyes: Pupils are equal, round, and reactive to light.  Neck: Normal range of motion. Neck supple.  Cardiovascular: Normal rate, regular rhythm and normal heart sounds.  Exam reveals no gallop and no friction rub.   No murmur heard. Pulmonary/Chest: Effort normal and  breath sounds normal. No respiratory distress. She has no wheezes. She has no rales.  Abdominal: Soft. Bowel sounds are normal. She exhibits no distension and no mass. There is tenderness in the right lower quadrant, suprapubic area and left lower quadrant. There is no rigidity, no rebound and no guarding.  Musculoskeletal: Normal range of motion. She exhibits no edema and no tenderness.  Neurological: She is alert and oriented to person, place, and time.  Skin: Skin is warm and dry.  Psychiatric: She has a normal mood and affect.    ED Course  Procedures (including critical care time) Labs Review Labs Reviewed  POC URINE PREG, ED  POC URINE PREG, ED    Imaging Review No results found.   EKG Interpretation None      MDM   Final diagnoses:  Pelvic pain in female  PID (acute pelvic inflammatory disease)    Pt is a 23 y.o. female with Pmhx as above who presents with recurrent low abdominal pain and n/v after discharge this afternoon for untreated PID. She states she tried to eat an egg roll and and then began feeling bad again. She reports almost vomiting once this evening.  She states that she started her period yesterday and has a history of having severe cramping when she is on her period.  She had not filled Rx's from earlier today due to cost.   In chart review, she has had multiple prior visits for pelvic pain, n/v, cramping going back to 2013. She has not had a positive GC/Chlam in our system since 2011. She had recent TVUS w/ L ovarian cyst. Given recent exam concerning for PID by colleague, will continue tx w/ azithro (will give 1g tonight and then she will only have to pay for 1g on 9/24), however, symptoms may be more likely to be c/w menstrual cramping or endometriosis.   On PE, VSS, pt uncomfortable, but in NAD. She has generalized abdominal ttp w/o rebound or guarding. Will give phenergan for n/v, short course of percocet for breakthrough pain and have her f/u with GYN.  She has tolerated PO in the ED and felt improved after 1 dose of IV narcotics & zofran.        Toy Cookey, MD 03/17/14 1252  Toy Cookey, MD 04/02/14 1259

## 2014-03-16 NOTE — ED Notes (Signed)
Pt aware urine sample is needed, cup provided 

## 2014-03-16 NOTE — ED Notes (Signed)
Patient ambulated with assistance of bedside visitor to restroom.

## 2014-03-16 NOTE — ED Notes (Signed)
Bed: ZO10 Expected date: 03/16/14 Expected time: 9:29 PM Means of arrival: Ambulance Comments: N,v ruptured cyst

## 2014-03-16 NOTE — ED Notes (Signed)
Entered patient room to triage patient. Patient previously asked to undress and change into a gown by nurse tech. Patient still is not undressed and changed. Reminded patient and visitor at bedside that she will need to change into a gown. Patient was questioned where she was hurting and would not answer. Patient was advised if she can not answer our questions we can not help her. Patient then states it hurts all over and started rubbing her abd. Patient was questioned why she did not get her Rx filled after d/c, patient would not answer. Patient visitor at bedside states they did not have the money to fill them. Patient visitor states they will have the money tomorrow to fill rx. Patient was again reminded she will need to change into a gown. At this time patient has stopped moaning.

## 2014-03-16 NOTE — ED Notes (Signed)
Per EMS, patient discharged earlier today with lower abd pain and intractable vomiting. Patient arrives with unfilled prescriptions from prior visit. Patient states to EMS she did not have time to fill them. Patient is moaning loudly at this time. Patient was given Fentanyl and  Zofran IV with EMS. Patient did not vomit while en route with EMS, but did have dry heaving.

## 2014-03-17 MED ORDER — AZITHROMYCIN 250 MG PO TABS
1000.0000 mg | ORAL_TABLET | Freq: Once | ORAL | Status: AC
  Administered 2014-03-17: 1000 mg via ORAL
  Filled 2014-03-17: qty 4

## 2014-03-17 MED ORDER — OXYCODONE-ACETAMINOPHEN 5-325 MG PO TABS: 1.0000 | ORAL_TABLET | Freq: Four times a day (QID) | ORAL | Status: DC | PRN

## 2014-03-17 MED ORDER — AZITHROMYCIN 250 MG PO TABS: ORAL_TABLET | ORAL | Status: DC

## 2014-03-17 MED ORDER — PROMETHAZINE HCL 25 MG PO TABS
25.0000 mg | ORAL_TABLET | Freq: Four times a day (QID) | ORAL | Status: DC | PRN
Start: 2014-03-17 — End: 2014-04-15

## 2014-03-17 NOTE — Discharge Instructions (Signed)
Pelvic Pain Female pelvic pain can be caused by many different things and start from a variety of places. Pelvic pain refers to pain that is located in the lower half of the abdomen and between your hips. The pain may occur over a short period of time (acute) or may be reoccurring (chronic). The cause of pelvic pain may be related to disorders affecting the female reproductive organs (gynecologic), but it may also be related to the bladder, kidney stones, an intestinal complication, or muscle or skeletal problems. Getting help right away for pelvic pain is important, especially if there has been severe, sharp, or a sudden onset of unusual pain. It is also important to get help right away because some types of pelvic pain can be life threatening.  CAUSES  Below are only some of the causes of pelvic pain. The causes of pelvic pain can be in one of several categories.   Gynecologic.  Pelvic inflammatory disease.  Sexually transmitted infection.  Ovarian cyst or a twisted ovarian ligament (ovarian torsion).  Uterine lining that grows outside the uterus (endometriosis).  Fibroids, cysts, or tumors.  Ovulation.  Pregnancy.  Pregnancy that occurs outside the uterus (ectopic pregnancy).  Miscarriage.  Labor.  Abruption of the placenta or ruptured uterus.  Infection.  Uterine infection (endometritis).  Bladder infection.  Diverticulitis.  Miscarriage related to a uterine infection (septic abortion).  Bladder.  Inflammation of the bladder (cystitis).  Kidney stone(s).  Gastrointestinal.  Constipation.  Diverticulitis.  Neurologic.  Trauma.  Feeling pelvic pain because of mental or emotional causes (psychosomatic).  Cancers of the bowel or pelvis. EVALUATION  Your caregiver will want to take a careful history of your concerns. This includes recent changes in your health, a careful gynecologic history of your periods (menses), and a sexual history. Obtaining your family  history and medical history is also important. Your caregiver may suggest a pelvic exam. A pelvic exam will help identify the location and severity of the pain. It also helps in the evaluation of which organ system may be involved. In order to identify the cause of the pelvic pain and be properly treated, your caregiver may order tests. These tests may include:   A pregnancy test.  Pelvic ultrasonography.  An X-ray exam of the abdomen.  A urinalysis or evaluation of vaginal discharge.  Blood tests. HOME CARE INSTRUCTIONS   Only take over-the-counter or prescription medicines for pain, discomfort, or fever as directed by your caregiver.   Rest as directed by your caregiver.   Eat a balanced diet.   Drink enough fluids to make your urine clear or pale yellow, or as directed.   Avoid sexual intercourse if it causes pain.   Apply warm or cold compresses to the lower abdomen depending on which one helps the pain.   Avoid stressful situations.   Keep a journal of your pelvic pain. Write down when it started, where the pain is located, and if there are things that seem to be associated with the pain, such as food or your menstrual cycle.  Follow up with your caregiver as directed.  SEEK MEDICAL CARE IF:  Your medicine does not help your pain.  You have abnormal vaginal discharge. SEEK IMMEDIATE MEDICAL CARE IF:   You have heavy bleeding from the vagina.   Your pelvic pain increases.   You feel light-headed or faint.   You have chills.   You have pain with urination or blood in your urine.   You have uncontrolled diarrhea   or vomiting.   You have a fever or persistent symptoms for more than 3 days.  You have a fever and your symptoms suddenly get worse.   You are being physically or sexually abused.  MAKE SURE YOU:  Understand these instructions.  Will watch your condition.  Will get help if you are not doing well or get worse. Document Released:  05/10/2004 Document Revised: 10/28/2013 Document Reviewed: 10/03/2011 Exit[employer] Patient Information 2015 [employer]. This information is not intended to replace advice given to you by your health care provider. Make sure you discuss any questions you have with your health care provider.  

## 2014-03-17 NOTE — ED Provider Notes (Signed)
Medical screening examination/treatment/procedure(s) were performed by non-physician practitioner and as supervising physician I was immediately available for consultation/collaboration.   EKG Interpretation None        Senaida Chilcote T Hermenegildo Clausen, MD 03/17/14 0933 

## 2014-03-17 NOTE — ED Notes (Signed)
Patient tolerating POs. Patient placed for D/C per MD request.

## 2014-03-17 NOTE — ED Notes (Signed)
Patient given gingerale. Will re-evaluate if patient is tolerating.

## 2014-03-22 ENCOUNTER — Encounter: Payer: Self-pay | Admitting: Internal Medicine

## 2014-03-22 ENCOUNTER — Encounter: Payer: Self-pay | Admitting: Obstetrics & Gynecology

## 2014-03-25 MED ORDER — ALBUTEROL SULFATE HFA 108 (90 BASE) MCG/ACT IN AERS: 2.0000 | INHALATION_SPRAY | Freq: Four times a day (QID) | RESPIRATORY_TRACT | Status: DC | PRN

## 2014-04-15 ENCOUNTER — Encounter (HOSPITAL_COMMUNITY): Payer: Self-pay | Admitting: Emergency Medicine

## 2014-04-15 ENCOUNTER — Emergency Department (HOSPITAL_COMMUNITY)
Admission: EM | Admit: 2014-04-15 | Discharge: 2014-04-15 | Disposition: A | Discharge status: Home / Self Care | Payer: 59 | Attending: Emergency Medicine | Admitting: Emergency Medicine

## 2014-04-15 DIAGNOSIS — Z87448 Personal history of other diseases of urinary system: Secondary | ICD-10-CM | POA: Insufficient documentation

## 2014-04-15 DIAGNOSIS — Z79899 Other long term (current) drug therapy: Secondary | ICD-10-CM | POA: Insufficient documentation

## 2014-04-15 DIAGNOSIS — K529 Noninfective gastroenteritis and colitis, unspecified: Secondary | ICD-10-CM | POA: Insufficient documentation

## 2014-04-15 DIAGNOSIS — J45909 Unspecified asthma, uncomplicated: Secondary | ICD-10-CM | POA: Insufficient documentation

## 2014-04-15 DIAGNOSIS — Z87891 Personal history of nicotine dependence: Secondary | ICD-10-CM | POA: Insufficient documentation

## 2014-04-15 DIAGNOSIS — R109 Unspecified abdominal pain: Secondary | ICD-10-CM | POA: Diagnosis present

## 2014-04-15 DIAGNOSIS — Z8742 Personal history of other diseases of the female genital tract: Secondary | ICD-10-CM | POA: Diagnosis not present

## 2014-04-15 LAB — COMPREHENSIVE METABOLIC PANEL
ALT: 10 U/L (ref 0–35)
AST: 22 U/L (ref 0–37)
Albumin: 4.2 g/dL (ref 3.5–5.2)
Alkaline Phosphatase: 62 U/L (ref 39–117)
Anion gap: 19 — ABNORMAL HIGH (ref 5–15)
BUN: 4 mg/dL — ABNORMAL LOW (ref 6–23)
CO2: 18 mEq/L — ABNORMAL LOW (ref 19–32)
Calcium: 9.1 mg/dL (ref 8.4–10.5)
Chloride: 105 mEq/L (ref 96–112)
Creatinine, Ser: 0.75 mg/dL (ref 0.50–1.10)
GFR calc Af Amer: >90 mL/min (ref >90)
GFR calc non Af Amer: >90 mL/min (ref >90)
Glucose, Bld: 117 mg/dL — ABNORMAL HIGH (ref 70–99)
Potassium: 4.1 mEq/L (ref 3.7–5.3)
Sodium: 142 mEq/L (ref 137–147)
Total Bilirubin: 0.6 mg/dL (ref 0.3–1.2)
Total Protein: 8.7 g/dL — ABNORMAL HIGH (ref 6.0–8.3)

## 2014-04-15 LAB — URINALYSIS, ROUTINE W REFLEX MICROSCOPIC
Bilirubin Urine: NEGATIVE
Glucose, UA: NEGATIVE mg/dL
Ketones, ur: NEGATIVE mg/dL
Nitrite: NEGATIVE
Protein, ur: 100 mg/dL — AB
Specific Gravity, Urine: 1.02 (ref 1.005–1.030)
Urobilinogen, UA: 1 mg/dL (ref 0.0–1.0)
pH: 8.5 — ABNORMAL HIGH (ref 5.0–8.0)

## 2014-04-15 LAB — URINE MICROSCOPIC-ADD ON

## 2014-04-15 LAB — CBC WITH DIFFERENTIAL/PLATELET
Basophils Absolute: 0.1 10*3/uL (ref 0.0–0.1)
Basophils Relative: 2 % — ABNORMAL HIGH (ref 0–1)
Eosinophils Absolute: 0.2 10*3/uL (ref 0.0–0.7)
Eosinophils Relative: 4 % (ref 0–5)
HCT: 40.2 % (ref 36.0–46.0)
Hemoglobin: 13.8 g/dL (ref 12.0–15.0)
Lymphocytes Relative: 42 % (ref 12–46)
Lymphs Abs: 2.1 10*3/uL (ref 0.7–4.0)
MCH: 27.4 pg (ref 26.0–34.0)
MCHC: 34.3 g/dL (ref 30.0–36.0)
MCV: 79.9 fL (ref 78.0–100.0)
Monocytes Absolute: 0.3 10*3/uL (ref 0.1–1.0)
Monocytes Relative: 7 % (ref 3–12)
Neutro Abs: 2.2 10*3/uL (ref 1.7–7.7)
Neutrophils Relative %: 45 % (ref 43–77)
Platelets: 366 10*3/uL (ref 150–400)
RBC: 5.03 MIL/uL (ref 3.87–5.11)
RDW: 12.8 % (ref 11.5–15.5)
WBC: 5 10*3/uL (ref 4.0–10.5)

## 2014-04-15 LAB — LIPASE, BLOOD: Lipase: 28 U/L (ref 11–59)

## 2014-04-15 LAB — HCG, SERUM, QUALITATIVE: Preg, Serum: NEGATIVE

## 2014-04-15 MED ORDER — ONDANSETRON HCL 4 MG/2ML IJ SOLN
4.0000 mg | Freq: Once | INTRAMUSCULAR | Status: AC
  Administered 2014-04-15: 4 mg via INTRAVENOUS
  Filled 2014-04-15: qty 2

## 2014-04-15 MED ORDER — FAMOTIDINE IN NACL 20-0.9 MG/50ML-% IV SOLN
20.0000 mg | Freq: Once | INTRAVENOUS | Status: AC
  Administered 2014-04-15: 20 mg via INTRAVENOUS
  Filled 2014-04-15: qty 50

## 2014-04-15 MED ORDER — FAMOTIDINE 20 MG PO TABS: 20.0000 mg | ORAL_TABLET | Freq: Two times a day (BID) | ORAL | Status: DC

## 2014-04-15 MED ORDER — SODIUM CHLORIDE 0.9 % IV SOLN
1000.0000 mL | INTRAVENOUS | Status: DC
  Administered 2014-04-15: 1000 mL via INTRAVENOUS

## 2014-04-15 MED ORDER — ONDANSETRON 4 MG PO TBDP: 4.0000 mg | ORAL_TABLET | ORAL | Status: DC | PRN

## 2014-04-15 MED ORDER — SODIUM CHLORIDE 0.9 % IV SOLN
1000.0000 mL | Freq: Once | INTRAVENOUS | Status: AC
  Administered 2014-04-15: 1000 mL via INTRAVENOUS

## 2014-04-15 NOTE — ED Provider Notes (Signed)
CSN: [id number]     Arrival date & time 04/15/14  [id number] History   First MD Initiated Contact with Patient 04/15/14 [phone]     Chief Complaint  Patient presents with  . Abdominal Pain     (Consider location/radiation/quality/duration/timing/severity/associated sxs/prior Treatment) HPI Woke at 05:00 then started having vomiting and diarrhea at same time, 2 episodes each. Generalized abdominal aching, resolves briefly after vomiting and diarrhea. Currently due to start menstrual cycle, occasionally associated. No fever. No dysuria or urgency. Was well at bedtime last night.  Past Medical History  Diagnosis Date  . UTI (lower urinary tract infection)   . Asthma   . Gastritis   . Ovarian cyst    Past Surgical History  Procedure Laterality Date  . No past surgeries     Family History  Problem Relation Age of Onset  . Hyperlipidemia Other   . Hypertension Other   . Arthritis Other   . Cancer Other     breast cancer  . Hypertension Other   . Diabetes Other    History  Substance Use Topics  . Smoking status: Former Smoker -- 0.10 packs/day    Types: Cigarettes  . Smokeless tobacco: Never Used  . Alcohol Use: Yes     Comment: 1-5 cans a week   OB History   Grav Para Term Preterm Abortions TAB SAB Ect Mult Living   0              Review of Systems  10 Systems reviewed and are negative for acute change except as noted in the HPI.  Allergies  Review of patient's allergies indicates no known allergies.  Home Medications   Prior to Admission medications   Medication Sig Start Date End Date Taking? Authorizing Provider  acetaminophen (TYLENOL) 325 MG tablet Take 650 mg by mouth every 6 (six) hours as needed.   Yes Historical Provider, MD  albuterol (PROVENTIL HFA;VENTOLIN HFA) 108 (90 BASE) MCG/ACT inhaler Inhale 2 puffs into the lungs every 6 (six) hours as needed for wheezing. 03/25/14  Yes Corwin LevinsJames W John, MD  ibuprofen (ADVIL,MOTRIN) 200 MG tablet Take 200 mg by mouth every  6 (six) hours as needed for fever or moderate pain.   Yes Historical Provider, MD  famotidine (PEPCID) 20 MG tablet Take 1 tablet (20 mg total) by mouth 2 (two) times daily. 04/15/14   Arby BarretteMarcy Eevie Lapp, MD  ondansetron (ZOFRAN ODT) 4 MG disintegrating tablet Take 1 tablet (4 mg total) by mouth every 4 (four) hours as needed for nausea or vomiting. 04/15/14   Arby BarretteMarcy Arvella Massingale, MD   BP 125/73  Pulse 84  Temp(Src) 98.1 F (36.7 C) (Oral)  Resp 16  SpO2 100%  LMP 04/15/2014 Physical Exam  Constitutional: She is oriented to person, place, and time. She appears well-developed and well-nourished.  Mild ill appearance, nontoxic.  HENT:  Head: Normocephalic and atraumatic.  Nose: Nose normal.  Mouth/Throat: No oropharyngeal exudate.  Eyes: Conjunctivae and EOM are normal. Pupils are equal, round, and reactive to light. Right eye exhibits no discharge. Left eye exhibits no discharge.  Neck: Neck supple.  Cardiovascular: Normal rate, regular rhythm, normal heart sounds and intact distal pulses.   Pulmonary/Chest: Effort normal and breath sounds normal. No respiratory distress. She has no wheezes. She has no rales.  Abdominal: Soft. Bowel sounds are normal. She exhibits no mass. There is no tenderness (mild/moderate central discomfort without guarding.). There is no rebound and no guarding.  Musculoskeletal: Normal range of motion. She exhibits  no edema and no tenderness.  Neurological: She is alert and oriented to person, place, and time. No cranial nerve deficit. Coordination normal.  Skin: Skin is warm and dry. No rash noted.  Psychiatric: She has a normal mood and affect.    ED Course  Procedures (including critical care time) Labs Review Labs Reviewed  COMPREHENSIVE METABOLIC PANEL - Abnormal; Notable for the following:    CO2 18 (*)    Glucose, Bld 117 (*)    BUN 4 (*)    Total Protein 8.7 (*)    Anion gap 19 (*)    All other components within normal limits  CBC WITH DIFFERENTIAL -  Abnormal; Notable for the following:    Basophils Relative 2 (*)    All other components within normal limits  URINALYSIS, ROUTINE W REFLEX MICROSCOPIC - Abnormal; Notable for the following:    Color, Urine AMBER (*)    APPearance CLOUDY (*)    pH 8.5 (*)    Hgb urine dipstick LARGE (*)    Protein, ur 100 (*)    Leukocytes, UA SMALL (*)    All other components within normal limits  URINE MICROSCOPIC-ADD ON - Abnormal; Notable for the following:    Squamous Epithelial / LPF MANY (*)    Bacteria, UA MANY (*)    All other components within normal limits  LIPASE, BLOOD  HCG, SERUM, QUALITATIVE    Imaging Review No results found.   EKG Interpretation None      MDM   Final diagnoses:  Gastroenteritis   The patient presents with accommodation vomiting and diarrhea of acute onset. At this point time there no significant lab abnormalities. Her abdominal examination is nonsurgical in nature. It was she is safe for continued outpatient management. She'll be try to symptomatic treatment with Zofran and Pepcid.    Arby BarretteMarcy Tachina Spoonemore, MD 04/15/14 [phone]

## 2014-04-15 NOTE — Discharge Instructions (Signed)

## 2014-04-15 NOTE — ED Notes (Signed)
Bed: WA05 Expected date:  Expected time:  Means of arrival:  Comments: EMS/abd pain 

## 2014-04-15 NOTE — ED Notes (Addendum)
Per EMS pt severe generalized abdominal pain starting 0600 this am. Pt reports 2 episodes of vomiting and diarrhea. Pt started menstrual cycle today. Pt hx of gastritis.

## 2014-04-23 ENCOUNTER — Other Ambulatory Visit: Payer: Self-pay | Admitting: *Deleted

## 2014-04-23 MED ORDER — ALBUTEROL SULFATE HFA 108 (90 BASE) MCG/ACT IN AERS: 2.0000 | INHALATION_SPRAY | Freq: Four times a day (QID) | RESPIRATORY_TRACT | Status: DC | PRN

## 2014-04-23 NOTE — Telephone Encounter (Signed)
Left msg on traige requesting refill on her albuterol inhaler. Called pt back refills has been sent to [facility]...Raechel Chute/lmb

## 2014-05-26 ENCOUNTER — Encounter (HOSPITAL_COMMUNITY): Payer: Self-pay | Admitting: Emergency Medicine

## 2014-05-26 ENCOUNTER — Emergency Department (HOSPITAL_COMMUNITY)
Admission: EM | Admit: 2014-05-26 | Discharge: 2014-05-26 | Disposition: A | Discharge status: Home / Self Care | Payer: 59 | Attending: Emergency Medicine | Admitting: Emergency Medicine

## 2014-05-26 DIAGNOSIS — N946 Dysmenorrhea, unspecified: Secondary | ICD-10-CM | POA: Diagnosis not present

## 2014-05-26 DIAGNOSIS — Z79899 Other long term (current) drug therapy: Secondary | ICD-10-CM | POA: Diagnosis not present

## 2014-05-26 DIAGNOSIS — N390 Urinary tract infection, site not specified: Secondary | ICD-10-CM

## 2014-05-26 DIAGNOSIS — R1084 Generalized abdominal pain: Secondary | ICD-10-CM | POA: Diagnosis present

## 2014-05-26 DIAGNOSIS — Z87891 Personal history of nicotine dependence: Secondary | ICD-10-CM | POA: Diagnosis not present

## 2014-05-26 DIAGNOSIS — N939 Abnormal uterine and vaginal bleeding, unspecified: Secondary | ICD-10-CM | POA: Insufficient documentation

## 2014-05-26 DIAGNOSIS — Z8719 Personal history of other diseases of the digestive system: Secondary | ICD-10-CM | POA: Diagnosis not present

## 2014-05-26 DIAGNOSIS — J45909 Unspecified asthma, uncomplicated: Secondary | ICD-10-CM | POA: Insufficient documentation

## 2014-05-26 DIAGNOSIS — Z3202 Encounter for pregnancy test, result negative: Secondary | ICD-10-CM | POA: Insufficient documentation

## 2014-05-26 LAB — URINALYSIS, ROUTINE W REFLEX MICROSCOPIC
Glucose, UA: NEGATIVE mg/dL
Ketones, ur: 15 mg/dL — AB
Nitrite: POSITIVE — AB
Protein, ur: 100 mg/dL — AB
Specific Gravity, Urine: 1.027 (ref 1.005–1.030)
Urobilinogen, UA: 1 mg/dL (ref 0.0–1.0)
pH: 6 (ref 5.0–8.0)

## 2014-05-26 LAB — I-STAT CHEM 8, ED
BUN: 7 mg/dL (ref 6–23)
Calcium, Ion: 1.12 mmol/L (ref 1.12–1.23)
Chloride: 106 mEq/L (ref 96–112)
Creatinine, Ser: 0.8 mg/dL (ref 0.50–1.10)
Glucose, Bld: 99 mg/dL (ref 70–99)
HCT: 41 % (ref 36.0–46.0)
Hemoglobin: 13.9 g/dL (ref 12.0–15.0)
Potassium: 4.1 mEq/L (ref 3.7–5.3)
Sodium: 140 mEq/L (ref 137–147)
TCO2: 21 mmol/L (ref 0–100)

## 2014-05-26 LAB — PREGNANCY, URINE: Preg Test, Ur: NEGATIVE

## 2014-05-26 LAB — URINE MICROSCOPIC-ADD ON

## 2014-05-26 MED ORDER — KETOROLAC TROMETHAMINE 60 MG/2ML IM SOLN
60.0000 mg | Freq: Once | INTRAMUSCULAR | Status: AC
  Administered 2014-05-26: 60 mg via INTRAMUSCULAR
  Filled 2014-05-26: qty 2

## 2014-05-26 MED ORDER — ACETAMINOPHEN 325 MG PO TABS
650.0000 mg | ORAL_TABLET | Freq: Once | ORAL | Status: AC
  Administered 2014-05-26: 650 mg via ORAL
  Filled 2014-05-26: qty 2

## 2014-05-26 MED ORDER — CEPHALEXIN 500 MG PO CAPS
500.0000 mg | ORAL_CAPSULE | Freq: Two times a day (BID) | ORAL | Status: DC
Start: 2014-05-26 — End: 2014-07-03

## 2014-05-26 NOTE — ED Provider Notes (Signed)
CSN: [id number]     Arrival date & time 05/26/14  1205 History   First MD Initiated Contact with Patient 05/26/14 1213     Chief Complaint  Patient presents with  . Abdominal Pain     (Consider location/radiation/quality/duration/timing/severity/associated sxs/prior Treatment) HPI Comments: 23 year old female with history of ovarian cysts, painful menstrual cramps presents with vaginal bleeding and lower diffuse abdominal cramping similar to multiple previous episodes. Patient is had approximate 6 of these episodes where her menstrual cramps are more severe than normal. Patient feels mild fatigue and has had 3 pad soaked today. Patient denies any vaginal discharge or new sexual partners. Mild dysuria.  Patient is a 23 y.o. female presenting with abdominal pain. The history is provided by the patient.  Abdominal Pain Associated symptoms: dysuria, fatigue and vaginal bleeding   Associated symptoms: no chest pain, no chills, no fever, no shortness of breath, no vaginal discharge and no vomiting     Past Medical History  Diagnosis Date  . UTI (lower urinary tract infection)   . Asthma   . Gastritis   . Ovarian cyst    Past Surgical History  Procedure Laterality Date  . No past surgeries     Family History  Problem Relation Age of Onset  . Hyperlipidemia Other   . Hypertension Other   . Arthritis Other   . Cancer Other     breast cancer  . Hypertension Other   . Diabetes Other    History  Substance Use Topics  . Smoking status: Former Smoker -- 0.10 packs/day    Types: Cigarettes  . Smokeless tobacco: Never Used  . Alcohol Use: Yes     Comment: 1-5 cans a week   OB History    Gravida Para Term Preterm AB TAB SAB Ectopic Multiple Living   0              Review of Systems  Constitutional: Positive for fatigue. Negative for fever and chills.  HENT: Negative for congestion.   Eyes: Negative for visual disturbance.  Respiratory: Negative for shortness of breath.    Cardiovascular: Negative for chest pain.  Gastrointestinal: Positive for abdominal pain. Negative for vomiting.  Genitourinary: Positive for dysuria and vaginal bleeding. Negative for flank pain and vaginal discharge.  Musculoskeletal: Negative for back pain, neck pain and neck stiffness.  Skin: Negative for rash.  Neurological: Positive for light-headedness. Negative for headaches.      Allergies  Review of patient's allergies indicates no known allergies.  Home Medications   Prior to Admission medications   Medication Sig Start Date End Date Taking? Authorizing Provider  acetaminophen (TYLENOL) 325 MG tablet Take 650 mg by mouth every 6 (six) hours as needed.   Yes Historical Provider, MD  albuterol (PROVENTIL HFA;VENTOLIN HFA) 108 (90 BASE) MCG/ACT inhaler Inhale 2 puffs into the lungs every 6 (six) hours as needed for wheezing. 04/23/14  Yes Corwin LevinsJames W John, MD  ondansetron (ZOFRAN ODT) 4 MG disintegrating tablet Take 1 tablet (4 mg total) by mouth every 4 (four) hours as needed for nausea or vomiting. 04/15/14  Yes Arby BarretteMarcy Pfeiffer, MD  cephALEXin (KEFLEX) 500 MG capsule Take 1 capsule (500 mg total) by mouth 2 (two) times daily. 05/26/14   Enid SkeensJoshua M Seve Monette, MD  famotidine (PEPCID) 20 MG tablet Take 1 tablet (20 mg total) by mouth 2 (two) times daily. Patient not taking: Reported on 05/26/2014 04/15/14   Arby BarretteMarcy Pfeiffer, MD   BP 154/95 mmHg  Pulse 86  Temp(Src)  97.8 F (36.6 C) (Oral)  Resp 18  LMP 05/25/2014 Physical Exam  Constitutional: She is oriented to person, place, and time. She appears well-developed and well-nourished. No distress.  HENT:  Head: Normocephalic and atraumatic.  Eyes: Conjunctivae are normal. Right eye exhibits no discharge. Left eye exhibits no discharge.  Neck: Normal range of motion. Neck supple. No tracheal deviation present.  Cardiovascular: Normal rate.   Pulmonary/Chest: Effort normal.  Abdominal: Soft. There is tenderness (mild pelvic bilateral).  There is no guarding.  Genitourinary:  Mild vaginal bleeding  Musculoskeletal: She exhibits no edema.  Neurological: She is alert and oriented to person, place, and time.  Skin: Skin is warm. No rash noted.  Psychiatric: She has a normal mood and affect.  Nursing note and vitals reviewed.   ED Course  Procedures (including critical care time) Labs Review Labs Reviewed  URINALYSIS, ROUTINE W REFLEX MICROSCOPIC - Abnormal; Notable for the following:    Color, Urine RED (*)    APPearance TURBID (*)    Hgb urine dipstick LARGE (*)    Bilirubin Urine MODERATE (*)    Ketones, ur 15 (*)    Protein, ur 100 (*)    Nitrite POSITIVE (*)    Leukocytes, UA MODERATE (*)    All other components within normal limits  GC/CHLAMYDIA PROBE AMP  PREGNANCY, URINE  URINE MICROSCOPIC-ADD ON  I-STAT CHEM 8, ED    Imaging Review No results found.   EKG Interpretation None      MDM   Final diagnoses:  Vaginal bleeding  Menstrual cramps  UTI (lower urinary tract infection)    Patient presents with severe menstrual cramps similar to previous. Bilateral symptoms. I do not suspect ovarian torsion or emergent pathology. Plan for hemoglobin check, urinalysis and pregnancy check. Altered exam unremarkable. No cervical motion tenderness.  Results and differential diagnosis were discussed with the patient/parent/guardian. Close follow up outpatient was discussed, comfortable with the plan.   Medications  acetaminophen (TYLENOL) tablet 650 mg (650 mg Oral Given 05/26/14 1257)  ketorolac (TORADOL) injection 60 mg (60 mg Intramuscular Given 05/26/14 1302)    Filed Vitals:   05/26/14 1207  BP: 154/95  Pulse: 86  Temp: 97.8 F (36.6 C)  TempSrc: Oral  Resp: 18    Final diagnoses:  Vaginal bleeding  Menstrual cramps  UTI (lower urinary tract infection)        Enid SkeensJoshua M Betsaida [place], MD 05/26/14 1348

## 2014-05-26 NOTE — ED Notes (Signed)
Bed: [id number] Expected date:  Expected time:  Means of arrival:  Comments: EMS-cramps

## 2014-05-26 NOTE — ED Notes (Signed)
Pt escorted to discharge window. Pt verbalized understanding discharge instructions. In no acute distress.  

## 2014-05-26 NOTE — ED Notes (Signed)
Pt alert and oriented x4. Respirations even and unlabored, bilateral symmetrical rise and fall of chest. Skin warm and dry. In no acute distress. Denies needs.   

## 2014-05-26 NOTE — Discharge Instructions (Signed)
Take ibuprofen every 6 hours and Tylenol every 4 as for cramping. Stay well-hydrated. Use heat packs as needed. Follow-up with [facility] clinic for reassessment.  Results and differential diagnosis were discussed with the patient/parent/guardian. Close follow up outpatient was discussed, comfortable with the plan.   Medications  acetaminophen (TYLENOL) tablet 650 mg (650 mg Oral Given 05/26/14 1257)  ketorolac (TORADOL) injection 60 mg (60 mg Intramuscular Given 05/26/14 1302)    Filed Vitals:   05/26/14 1207  BP: 154/95  Pulse: 86  Temp: 97.8 F (36.6 C)  TempSrc: Oral  Resp: 18    Final diagnoses:  Vaginal bleeding  Menstrual cramps     Dysmenorrhea Menstrual cramps (dysmenorrhea) are caused by the muscles of the uterus tightening (contracting) during a menstrual period. For some women, this discomfort is merely bothersome. For others, dysmenorrhea can be severe enough to interfere with everyday activities for a few days each month. Primary dysmenorrhea is menstrual cramps that last a couple of days when you start having menstrual periods or soon after. This often begins after a teenager starts having her period. As a woman gets older or has a baby, the cramps will usually lessen or disappear. Secondary dysmenorrhea begins later in life, lasts longer, and the pain may be stronger than primary dysmenorrhea. The pain may start before the period and last a few days after the period.  CAUSES  Dysmenorrhea is usually caused by an underlying problem, such as:  The tissue lining the uterus grows outside of the uterus in other areas of the body (endometriosis).  The endometrial tissue, which normally lines the uterus, is found in or grows into the muscular walls of the uterus (adenomyosis).  The pelvic blood vessels are engorged with blood just before the menstrual period (pelvic congestive syndrome).  Overgrowth of cells (polyps) in the lining of the uterus or cervix.  Falling down  of the uterus (prolapse) because of loose or stretched ligaments.  Depression.  Bladder problems, infection, or inflammation.  Problems with the intestine, a tumor, or irritable bowel syndrome.  Cancer of the female organs or bladder.  A severely tipped uterus.  A very tight opening or closed cervix.  Noncancerous tumors of the uterus (fibroids).  Pelvic inflammatory disease (PID).  Pelvic scarring (adhesions) from a previous surgery.  Ovarian cyst.  An intrauterine device (IUD) used for birth control. RISK FACTORS You may be at greater risk of dysmenorrhea if:  You are younger than age 24.  You started puberty early.  You have irregular or heavy bleeding.  You have never given birth.  You have a family history of this problem.  You are a smoker. SIGNS AND SYMPTOMS   Cramping or throbbing pain in your lower abdomen.  Headaches.  Lower back pain.  Nausea or vomiting.  Diarrhea.  Sweating or dizziness.  Loose stools. DIAGNOSIS  A diagnosis is based on your history, symptoms, physical exam, diagnostic tests, or procedures. Diagnostic tests or procedures may include:  Blood tests.  Ultrasonography.  An examination of the lining of the uterus (dilation and curettage, D&C).  An examination inside your abdomen or pelvis with a scope (laparoscopy).  X-rays.  CT scan.  MRI.  An examination inside the bladder with a scope (cystoscopy).  An examination inside the intestine or stomach with a scope (colonoscopy, gastroscopy). TREATMENT  Treatment depends on the cause of the dysmenorrhea. Treatment may include:  Pain medicine prescribed by your health care provider.  Birth control pills or an IUD with progesterone  hormone in it.  Hormone replacement therapy.  Nonsteroidal anti-inflammatory drugs (NSAIDs). These may help stop the production of prostaglandins.  Surgery to remove adhesions, endometriosis, ovarian cyst, or fibroids.  Removal of the  uterus (hysterectomy).  Progesterone shots to stop the menstrual period.  Cutting the nerves on the sacrum that go to the female organs (presacral neurectomy).  Electric current to the sacral nerves (sacral nerve stimulation).  Antidepressant medicine.  Psychiatric therapy, counseling, or group therapy.  Exercise and physical therapy.  Meditation and yoga therapy.  Acupuncture. HOME CARE INSTRUCTIONS   Only take over-the-counter or prescription medicines as directed by your health care provider.  Place a heating pad or hot water bottle on your lower back or abdomen. Do not sleep with the heating pad.  Use aerobic exercises, walking, swimming, biking, and other exercises to help lessen the cramping.  Massage to the lower back or abdomen may help.  Stop smoking.  Avoid alcohol and caffeine. SEEK MEDICAL CARE IF:   Your pain does not get better with medicine.  You have pain with sexual intercourse.  Your pain increases and is not controlled with medicines.  You have abnormal vaginal bleeding with your period.  You develop nausea or vomiting with your period that is not controlled with medicine. SEEK IMMEDIATE MEDICAL CARE IF:  You pass out.  Document Released: 06/13/2005 Document Revised: 02/13/2013 Document Reviewed: 11/29/2012 [facility] Patient Information 2015 [place], [place]. This information is not intended to replace advice given to you by your health care provider. Make sure you discuss any questions you have with your health care provider.

## 2014-05-26 NOTE — ED Notes (Signed)
Lower abd pain started ago. Currently on period which is irregular and normal to be severe.

## 2014-05-26 NOTE — ED Notes (Signed)
Pt crying out hysterically in room, will stop when you leave room.

## 2014-06-01 ENCOUNTER — Emergency Department (HOSPITAL_COMMUNITY)
Admission: EM | Admit: 2014-06-01 | Discharge: 2014-06-01 | Disposition: A | Discharge status: Home / Self Care | Payer: 59 | Attending: Emergency Medicine | Admitting: Emergency Medicine

## 2014-06-01 ENCOUNTER — Encounter (HOSPITAL_COMMUNITY): Payer: Self-pay

## 2014-06-01 ENCOUNTER — Emergency Department (HOSPITAL_COMMUNITY): Payer: 59

## 2014-06-01 DIAGNOSIS — Z792 Long term (current) use of antibiotics: Secondary | ICD-10-CM | POA: Diagnosis not present

## 2014-06-01 DIAGNOSIS — Z8742 Personal history of other diseases of the female genital tract: Secondary | ICD-10-CM | POA: Diagnosis not present

## 2014-06-01 DIAGNOSIS — Z79899 Other long term (current) drug therapy: Secondary | ICD-10-CM | POA: Insufficient documentation

## 2014-06-01 DIAGNOSIS — Z8719 Personal history of other diseases of the digestive system: Secondary | ICD-10-CM | POA: Insufficient documentation

## 2014-06-01 DIAGNOSIS — J45901 Unspecified asthma with (acute) exacerbation: Secondary | ICD-10-CM | POA: Insufficient documentation

## 2014-06-01 DIAGNOSIS — R6883 Chills (without fever): Secondary | ICD-10-CM | POA: Diagnosis not present

## 2014-06-01 DIAGNOSIS — R0602 Shortness of breath: Secondary | ICD-10-CM | POA: Diagnosis present

## 2014-06-01 DIAGNOSIS — Z87440 Personal history of urinary (tract) infections: Secondary | ICD-10-CM | POA: Insufficient documentation

## 2014-06-01 DIAGNOSIS — Z87891 Personal history of nicotine dependence: Secondary | ICD-10-CM | POA: Diagnosis not present

## 2014-06-01 MED ORDER — PREDNISONE 50 MG PO TABS: 50.0000 mg | ORAL_TABLET | Freq: Every day | ORAL | Status: DC

## 2014-06-01 MED ORDER — BENZONATATE 100 MG PO CAPS: 100.0000 mg | ORAL_CAPSULE | Freq: Three times a day (TID) | ORAL | Status: DC

## 2014-06-01 MED ORDER — ALBUTEROL SULFATE HFA 108 (90 BASE) MCG/ACT IN AERS: 2.0000 | INHALATION_SPRAY | RESPIRATORY_TRACT | Status: DC | PRN

## 2014-06-01 MED ORDER — PREDNISONE 20 MG PO TABS
60.0000 mg | ORAL_TABLET | Freq: Once | ORAL | Status: AC
  Administered 2014-06-01: 60 mg via ORAL
  Filled 2014-06-01: qty 3

## 2014-06-01 MED ORDER — IPRATROPIUM BROMIDE 0.02 % IN SOLN
0.5000 mg | Freq: Once | RESPIRATORY_TRACT | Status: AC
  Administered 2014-06-01: 0.5 mg via RESPIRATORY_TRACT
  Filled 2014-06-01: qty 2.5

## 2014-06-01 MED ORDER — ALBUTEROL SULFATE (2.5 MG/3ML) 0.083% IN NEBU
5.0000 mg | INHALATION_SOLUTION | Freq: Once | RESPIRATORY_TRACT | Status: AC
Start: 2014-06-01 — End: 2014-06-01
  Administered 2014-06-01: 5 mg via RESPIRATORY_TRACT
  Filled 2014-06-01: qty 6

## 2014-06-01 NOTE — ED Notes (Signed)
Pt presents with c/o cough and asthma exacerbation. Pt reports she has had these symptoms for approx 2 weeks. Pt has used her inhaler today.

## 2014-06-01 NOTE — ED Provider Notes (Signed)
CSN: [id number]     Arrival date & time 06/01/14  1701 History   First MD Initiated Contact with Patient 06/01/14 1712     Chief Complaint  Patient presents with  . Asthma     (Consider location/radiation/quality/duration/timing/severity/associated sxs/prior Treatment) HPI Andrea Daniels is a 23 y.o. female with hx of asthma, presents to ED with complaint of cough and wheezing. Pt states her symptoms began 2 wks ago. States she has had URI symptoms when her symptoms began. States nasal congestion and sore throat improved. States cough is productive with clear sputum. States pain in the chest with cough. Admits to wheezing and SOB. States using her inhaler almost every 30 min. Denies fever, admits to chills. No other complaints.   Past Medical History  Diagnosis Date  . UTI (lower urinary tract infection)   . Asthma   . Gastritis   . Ovarian cyst    Past Surgical History  Procedure Laterality Date  . No past surgeries     Family History  Problem Relation Age of Onset  . Hyperlipidemia Other   . Hypertension Other   . Arthritis Other   . Cancer Other     breast cancer  . Hypertension Other   . Diabetes Other    History  Substance Use Topics  . Smoking status: Former Smoker -- 0.10 packs/day    Types: Cigarettes  . Smokeless tobacco: Never Used  . Alcohol Use: Yes     Comment: 1-5 cans a week   OB History    Gravida Para Term Preterm AB TAB SAB Ectopic Multiple Living   0              Review of Systems  Constitutional: Positive for chills. Negative for fever.  Respiratory: Positive for cough, shortness of breath and wheezing. Negative for chest tightness.   Cardiovascular: Negative for chest pain, palpitations and leg swelling.  Gastrointestinal: Negative for nausea, vomiting, abdominal pain and diarrhea.  Musculoskeletal: Negative for myalgias, arthralgias, neck pain and neck stiffness.  Skin: Negative for rash.  Neurological: Negative for dizziness, weakness and  headaches.  All other systems reviewed and are negative.     Allergies  Review of patient's allergies indicates no known allergies.  Home Medications   Prior to Admission medications   Medication Sig Start Date End Date Taking? Authorizing Provider  acetaminophen (TYLENOL) 325 MG tablet Take 650 mg by mouth every 6 (six) hours as needed.    Historical Provider, MD  albuterol (PROVENTIL HFA;VENTOLIN HFA) 108 (90 BASE) MCG/ACT inhaler Inhale 2 puffs into the lungs every 6 (six) hours as needed for wheezing. 04/23/14   Corwin LevinsJames W John, MD  cephALEXin (KEFLEX) 500 MG capsule Take 1 capsule (500 mg total) by mouth 2 (two) times daily. 05/26/14   Enid SkeensJoshua M Zavitz, MD  famotidine (PEPCID) 20 MG tablet Take 1 tablet (20 mg total) by mouth 2 (two) times daily. Patient not taking: Reported on 05/26/2014 04/15/14   Arby BarretteMarcy Pfeiffer, MD  ondansetron (ZOFRAN ODT) 4 MG disintegrating tablet Take 1 tablet (4 mg total) by mouth every 4 (four) hours as needed for nausea or vomiting. 04/15/14   Arby BarretteMarcy Pfeiffer, MD   BP 128/100 mmHg  Pulse 69  Temp(Src) 97.4 F (36.3 C) (Oral)  Resp 16  SpO2 96%  LMP 05/25/2014 Physical Exam  Constitutional: She is oriented to person, place, and time. She appears well-developed and well-nourished. No distress.  HENT:  Head: Normocephalic.  Right Ear: External ear normal.  Left Ear: External ear normal.  Nose: Nose normal.  Mouth/Throat: Oropharynx is clear and moist.  Eyes: Conjunctivae are normal.  Neck: Neck supple.  Cardiovascular: Normal rate, regular rhythm and normal heart sounds.   Pulmonary/Chest: Effort normal. No respiratory distress. She has wheezes. She has no rales.  Inspiratory and expiratory wheezes bilaterally, decreased air movement bilaterally. Speaking in full sentences. No respiratory distress  Abdominal: Soft. Bowel sounds are normal. She exhibits no distension. There is no tenderness. There is no rebound.  Musculoskeletal: She exhibits no edema.   Neurological: She is alert and oriented to person, place, and time.  Skin: Skin is warm and dry.  Psychiatric: She has a normal mood and affect. Her behavior is normal.  Nursing note and vitals reviewed.   ED Course  Procedures (including critical care time) Labs Review Labs Reviewed - No data to display  Imaging Review Dg Chest 2 View  06/01/2014   CLINICAL DATA:  Shortness of breath, cough, congestion for 2 weeks  EXAM: CHEST  2 VIEW  COMPARISON:  08/06/2013  FINDINGS: The heart size and mediastinal contours are within normal limits. Both lungs are clear. The visualized skeletal structures are unremarkable.  IMPRESSION: No active cardiopulmonary disease.   Electronically Signed   By: Elige KoHetal  Patel   On: 06/01/2014 18:50     EKG Interpretation None      MDM   Final diagnoses:  SOB (shortness of breath)    Patient is here with cough and wheezing for 2 weeks. She is afebrile, vital signs are normal. Due to prolonged cough with productive sputum, chills, recent URI, will get a chest x-ray to make sure she does not have pneumonia. Will treat with a breathing treatment and prednisone dose here.  7:04 PM CXR negative. Pt feels better with neb treatment and prednisone. VS normal. Home with inhaler, prednisone, tessalon. Follow up with primary care doctor.  Filed Vitals:   06/01/14 1712 06/01/14 1753  BP: 128/100   Pulse: 69   Temp: 97.4 F (36.3 C)   TempSrc: Oral   Resp: 16   SpO2: 96% 96%     Lottie Musselatyana A Anarely Nicholls, PA-C 06/01/14 1905  Richardean Canalavid H Yao, MD 06/01/14 2324

## 2014-06-01 NOTE — Discharge Instructions (Signed)
Take your inhaler 2 puffs every 3-4 hrs. Prednisone daily until all gone. Tessalon for cough. Follow up with primary care doctor.    Asthma, Acute Bronchospasm Acute bronchospasm caused by asthma is also referred to as an asthma attack. Bronchospasm means your air passages become narrowed. The narrowing is caused by inflammation and tightening of the muscles in the air tubes (bronchi) in your lungs. This can make it hard to breathe or cause you to wheeze and cough. CAUSES Possible triggers are:  Animal dander from the skin, hair, or feathers of animals.  Dust mites contained in house dust.  Cockroaches.  Pollen from trees or grass.  Mold.  Cigarette or tobacco smoke.  Air pollutants such as dust, household cleaners, hair sprays, aerosol sprays, paint fumes, strong chemicals, or strong odors.  Cold air or weather changes. Cold air may trigger inflammation. Winds increase molds and pollens in the air.  Strong emotions such as crying or laughing hard.  Stress.  Certain medicines such as aspirin or beta-blockers.  Sulfites in foods and drinks, such as dried fruits and wine.  Infections or inflammatory conditions, such as a flu, cold, or inflammation of the nasal membranes (rhinitis).  Gastroesophageal reflux disease (GERD). GERD is a condition where stomach acid backs up into your esophagus.  Exercise or strenuous activity. SIGNS AND SYMPTOMS   Wheezing.  Excessive coughing, particularly at night.  Chest tightness.  Shortness of breath. DIAGNOSIS  Your health care provider will ask you about your medical history and perform a physical exam. A chest X-ray or blood testing may be performed to look for other causes of your symptoms or other conditions that may have triggered your asthma attack. TREATMENT  Treatment is aimed at reducing inflammation and opening up the airways in your lungs. Most asthma attacks are treated with inhaled medicines. These include quick relief  or rescue medicines (such as bronchodilators) and controller medicines (such as inhaled corticosteroids). These medicines are sometimes given through an inhaler or a nebulizer. Systemic steroid medicine taken by mouth or given through an IV tube also can be used to reduce the inflammation when an attack is moderate or severe. Antibiotic medicines are only used if a bacterial infection is present.  HOME CARE INSTRUCTIONS   Rest.  Drink plenty of liquids. This helps the mucus to remain thin and be easily coughed up. Only use caffeine in moderation and do not use alcohol until you have recovered from your illness.  Do not smoke. Avoid being exposed to secondhand smoke.  You play a critical role in keeping yourself in good health. Avoid exposure to things that cause you to wheeze or to have breathing problems.  Keep your medicines up-to-date and available. Carefully follow your health care provider's treatment plan.  Take your medicine exactly as prescribed.  When pollen or pollution is bad, keep windows closed and use an air conditioner or go to places with air conditioning.  Asthma requires careful medical care. See your health care provider for a follow-up as advised. If you are more than [redacted] weeks pregnant and you were prescribed any new medicines, let your obstetrician know about the visit and how you are doing. Follow up with your health care provider as directed.  After you have recovered from your asthma attack, make an appointment with your outpatient doctor to talk about ways to reduce the likelihood of future attacks. If you do not have a doctor who manages your asthma, make an appointment with a primary care doctor  to discuss your asthma. SEEK IMMEDIATE MEDICAL CARE IF:   You are getting worse.  You have trouble breathing. If severe, call your local emergency services (911 in the [place]  You develop chest pain or discomfort.  You are vomiting.  You are not able to keep fluids  down.  You are coughing up yellow, green, brown, or bloody sputum.  You have a fever and your symptoms suddenly get worse.  You have trouble swallowing. MAKE SURE YOU:   Understand these instructions.  Will watch your condition.  Will get help right away if you are not doing well or get worse. Document Released: 09/28/2006 Document Revised: 06/18/2013 Document Reviewed: 12/19/2012 [facility] Patient Information 2015 [place] Ranch[place]Care, [place]. This information is not intended to replace advice given to you by your health care provider. Make sure you discuss any questions you have with your health care provider.

## 2014-06-18 ENCOUNTER — Encounter: Payer: Self-pay | Admitting: Internal Medicine

## 2014-06-18 ENCOUNTER — Other Ambulatory Visit: Payer: Self-pay | Admitting: *Deleted

## 2014-06-18 MED ORDER — ALBUTEROL SULFATE HFA 108 (90 BASE) MCG/ACT IN AERS: 2.0000 | INHALATION_SPRAY | Freq: Four times a day (QID) | RESPIRATORY_TRACT | Status: DC | PRN

## 2014-06-18 NOTE — Telephone Encounter (Signed)
Sent email needing refill on her albuterol inhaler...Raechel Chute/lmb

## 2014-07-03 ENCOUNTER — Encounter: Payer: Self-pay | Admitting: Internal Medicine

## 2014-07-03 ENCOUNTER — Other Ambulatory Visit (INDEPENDENT_AMBULATORY_CARE_PROVIDER_SITE_OTHER): Payer: 59

## 2014-07-03 ENCOUNTER — Ambulatory Visit (INDEPENDENT_AMBULATORY_CARE_PROVIDER_SITE_OTHER): Payer: 59 | Admitting: Internal Medicine

## 2014-07-03 VITALS — BP 110/70 | HR 84 | Temp 98.2°F | Wt 156.0 lb

## 2014-07-03 DIAGNOSIS — Z0000 Encounter for general adult medical examination without abnormal findings: Secondary | ICD-10-CM

## 2014-07-03 DIAGNOSIS — R7309 Other abnormal glucose: Secondary | ICD-10-CM

## 2014-07-03 DIAGNOSIS — J4521 Mild intermittent asthma with (acute) exacerbation: Secondary | ICD-10-CM

## 2014-07-03 DIAGNOSIS — J069 Acute upper respiratory infection, unspecified: Secondary | ICD-10-CM

## 2014-07-03 LAB — BASIC METABOLIC PANEL
BUN: 12 mg/dL (ref 6–23)
CO2: 26 mEq/L (ref 19–32)
Calcium: 8.8 mg/dL (ref 8.4–10.5)
Chloride: 107 mEq/L (ref 96–112)
Creatinine, Ser: 0.9 mg/dL (ref 0.4–1.2)
GFR: 103.68 mL/min (ref >60.00)
Glucose, Bld: 90 mg/dL (ref 70–99)
Potassium: 3.6 mEq/L (ref 3.5–5.1)
Sodium: 139 mEq/L (ref 135–145)

## 2014-07-03 LAB — URINALYSIS, ROUTINE W REFLEX MICROSCOPIC
Bilirubin Urine: NEGATIVE
Ketones, ur: NEGATIVE
Leukocytes, UA: NEGATIVE
Nitrite: NEGATIVE
Specific Gravity, Urine: >=1.03 — AB (ref 1.000–1.030)
Total Protein, Urine: NEGATIVE
Urine Glucose: NEGATIVE
Urobilinogen, UA: 0.2 (ref 0.0–1.0)
pH: 5.5 (ref 5.0–8.0)

## 2014-07-03 LAB — CBC WITH DIFFERENTIAL/PLATELET
Basophils Absolute: 0.1 10*3/uL (ref 0.0–0.1)
Basophils Relative: 1 % (ref 0.0–3.0)
Eosinophils Absolute: 0.5 10*3/uL (ref 0.0–0.7)
Eosinophils Relative: 9.2 % — ABNORMAL HIGH (ref 0.0–5.0)
HCT: 39.7 % (ref 36.0–46.0)
Hemoglobin: 12.9 g/dL (ref 12.0–15.0)
Lymphocytes Relative: 52.1 % — ABNORMAL HIGH (ref 12.0–46.0)
Lymphs Abs: 2.9 10*3/uL (ref 0.7–4.0)
MCHC: 32.3 g/dL (ref 30.0–36.0)
MCV: 81.8 fl (ref 78.0–100.0)
Monocytes Absolute: 0.5 10*3/uL (ref 0.1–1.0)
Monocytes Relative: 8.1 % (ref 3.0–12.0)
Neutro Abs: 1.7 10*3/uL (ref 1.4–7.7)
Neutrophils Relative %: 29.6 % — ABNORMAL LOW (ref 43.0–77.0)
Platelets: 334 10*3/uL (ref 150.0–400.0)
RBC: 4.86 Mil/uL (ref 3.87–5.11)
RDW: 14.2 % (ref 11.5–15.5)
WBC: 5.6 10*3/uL (ref 4.0–10.5)

## 2014-07-03 LAB — HEMOGLOBIN A1C: Hgb A1c MFr Bld: 6.2 % (ref 4.6–6.5)

## 2014-07-03 LAB — TSH: TSH: 3.42 u[IU]/mL (ref 0.35–4.50)

## 2014-07-03 LAB — LIPID PANEL
Cholesterol: 178 mg/dL (ref 0–200)
HDL: 59.8 mg/dL (ref >39.00)
LDL Cholesterol: 109 mg/dL — ABNORMAL HIGH (ref 0–99)
NonHDL: 118.2
Total CHOL/HDL Ratio: 3
Triglycerides: 47 mg/dL (ref 0.0–149.0)
VLDL: 9.4 mg/dL (ref 0.0–40.0)

## 2014-07-03 LAB — HEPATIC FUNCTION PANEL
ALT: 16 U/L (ref 0–35)
AST: 21 U/L (ref 0–37)
Albumin: 3.9 g/dL (ref 3.5–5.2)
Alkaline Phosphatase: 61 U/L (ref 39–117)
Bilirubin, Direct: 0.1 mg/dL (ref 0.0–0.3)
Total Bilirubin: 0.7 mg/dL (ref 0.2–1.2)
Total Protein: 7.4 g/dL (ref 6.0–8.3)

## 2014-07-03 MED ORDER — PREDNISONE 10 MG PO TABS: ORAL_TABLET | ORAL | Status: DC

## 2014-07-03 MED ORDER — AZITHROMYCIN 250 MG PO TABS: ORAL_TABLET | ORAL | Status: DC

## 2014-07-03 NOTE — Progress Notes (Signed)
Pre visit review using our clinic review tool, if applicable. No additional management support is needed unless otherwise documented below in the visit note. 

## 2014-07-03 NOTE — Assessment & Plan Note (Signed)

## 2014-07-03 NOTE — Progress Notes (Signed)
Subjective:    Patient ID: Andrea Daniels, female    DOB: [birth year], 24 y.o.   MRN: [id number]  HPI  Here for wellness and f/u;  Overall doing ok;  Pt denies CP, worsening SOB, DOE, wheezing, orthopnea, PND, worsening LE edema, palpitations, dizziness or syncope, but has incidentally  Here with 2-3 days acute onset fever, facial pain, pressure, headache, general weakness and malaise, and greenish d/c, with mild ST and what feels starting to wheeze again without sob, but pt denies chest pain, orthopnea, PND, increased LE swelling, palpitations, dizziness or syncope.  Pt denies neurological change such as new headache, facial or extremity weakness.  Pt denies polydipsia, polyuria, or low sugar symptoms. Pt states overall good compliance with treatment and medications, good tolerability, and has been trying to follow lower cholesterol diet.  Pt denies worsening depressive symptoms, suicidal ideation or panic. No fever, night sweats, wt loss, loss of appetite, or other constitutional symptoms.  Pt states good ability with ADL's, has low fall risk, home safety reviewed and adequate, no other significant changes in hearing or vision, and only occasionally active with exercise.  No new complaints Past Medical History  Diagnosis Date  . UTI (lower urinary tract infection)   . Asthma   . Gastritis   . Ovarian cyst    Past Surgical History  Procedure Laterality Date  . No past surgeries      reports that she has quit smoking. Her smoking use included Cigarettes. She smoked 0.10 packs per day. She has never used smokeless tobacco. She reports that she drinks alcohol. She reports that she does not use illicit drugs. family history includes Arthritis in her other; Cancer in her other; Diabetes in her other; Hyperlipidemia in her other; Hypertension in her other and other. No Known Allergies Current Outpatient Prescriptions on File Prior to Visit  Medication Sig Dispense Refill  . acetaminophen (TYLENOL)  325 MG tablet Take 650 mg by mouth every 6 (six) hours as needed.    Marland Kitchen albuterol (PROVENTIL HFA;VENTOLIN HFA) 108 (90 BASE) MCG/ACT inhaler Inhale 2 puffs into the lungs every 4 (four) hours as needed for wheezing or shortness of breath. 1 Inhaler 0  . albuterol (PROVENTIL HFA;VENTOLIN HFA) 108 (90 BASE) MCG/ACT inhaler Inhale 2 puffs into the lungs every 6 (six) hours as needed for wheezing. 1 Inhaler 0  . famotidine (PEPCID) 20 MG tablet Take 1 tablet (20 mg total) by mouth 2 (two) times daily. (Patient taking differently: Take 20 mg by mouth 2 (two) times daily as needed for heartburn or indigestion. ) 30 tablet 0  . ondansetron (ZOFRAN ODT) 4 MG disintegrating tablet Take 1 tablet (4 mg total) by mouth every 4 (four) hours as needed for nausea or vomiting. 20 tablet 0  . predniSONE (DELTASONE) 50 MG tablet Take 1 tablet (50 mg total) by mouth daily. 5 tablet 0   No current facility-administered medications on file prior to visit.   Review of Systems Constitutional: Negative for increased diaphoresis, other activity, appetite or other siginficant weight change  HENT: Negative for worsening hearing loss, ear pain, facial swelling, mouth sores and neck stiffness.   Eyes: Negative for other worsening pain, redness or visual disturbance.  Respiratory: Negative for shortness of breath and wheezing.   Cardiovascular: Negative for chest pain and palpitations.  Gastrointestinal: Negative for diarrhea, blood in stool, abdominal distention or other pain Genitourinary: Negative for hematuria, flank pain or change in urine volume.  Musculoskeletal: Negative for myalgias or other  joint complaints.  Skin: Negative for color change and wound.  Neurological: Negative for syncope and numbness. other than noted Hematological: Negative for adenopathy. or other swelling Psychiatric/Behavioral: Negative for hallucinations, self-injury, decreased concentration or other worsening agitation.      Objective:    Physical Exam BP 110/70 mmHg  Pulse 84  Temp(Src) 98.2 F (36.8 C) (Oral)  Wt 156 lb (70.761 kg)  SpO2 97% VS noted, mild ill Constitutional: Pt is oriented to person, place, and time. Appears well-developed and well-nourished.  Head: Normocephalic and atraumatic.  Right Ear: External ear normal.  Left Ear: External ear normal.  Nose: Nose normal.  Bilat tm's with mild erythema.  Max sinus areas non tender.  Pharynx with mild erythema, no exudate Mouth/Throat: Oropharynx is clear and moist.  Eyes: Conjunctivae and EOM are normal. Pupils are equal, round, and reactive to light.  Neck: Normal range of motion. Neck supple. No JVD present. No tracheal deviation present.  Cardiovascular: Normal rate, regular rhythm, normal heart sounds and intact distal pulses.   Pulmonary/Chest: Effort normal and breath sounds without rales but decreased with few wheezing bilat Abdominal: Soft. Bowel sounds are normal. NT. No HSM  Musculoskeletal: Normal range of motion. Exhibits no edema.  Lymphadenopathy:  Has no cervical adenopathy.  Neurological: Pt is alert and oriented to person, place, and time. Pt has normal reflexes. No cranial nerve deficit. Motor grossly intact Skin: Skin is warm and dry. No rash noted.  Psychiatric:  Has normal mood and affect. Behavior is normal.  Wt Readings from Last 3 Encounters:  07/03/14 156 lb (70.761 kg)  01/30/14 147 lb 9.6 oz (66.951 kg)  01/08/14 136 lb (61.689 kg)       Assessment & Plan:

## 2014-07-03 NOTE — Patient Instructions (Signed)
Please take all new medication as prescribed - the antibiotic, and prednisone  Please continue all other medications as before, and refills have been done if requested.  Please have the pharmacy call with any other refills you may need.  Please continue your efforts at being more active, low cholesterol diet, and weight control.  You are otherwise up to date with prevention measures today.  Please keep your appointments with your specialists as you may have planned  Please go to the LAB in the Basement (turn left off the elevator) for the tests to be done today  You will be contacted by phone if any changes need to be made immediately.  Otherwise, you will receive a letter about your results with an explanation, but please check with MyChart first.  Please remember to sign up for MyChart if you have not done so, as this will be important to you in the future with finding out test results, communicating by private email, and scheduling acute appointments online when needed.  Please return in 1 year for your yearly visit, or sooner if needed

## 2014-07-03 NOTE — Assessment & Plan Note (Signed)
Mild, cont'd proventil, but also short course predpac ,  to f/u any worsening symptoms or concerns

## 2014-07-03 NOTE — Assessment & Plan Note (Signed)
Mild to mod, for antibx course,  to f/u any worsening symptoms or concerns 

## 2014-07-03 NOTE — Assessment & Plan Note (Signed)
Has gained 20 lbs in 6 mo, most recent glc 2 mo ago agt 117, for exercise, diet, wt loss, and check a1c today

## 2014-07-04 ENCOUNTER — Encounter: Payer: Self-pay | Admitting: Internal Medicine

## 2014-07-07 NOTE — Telephone Encounter (Signed)
Schedulers to see above

## 2014-07-17 ENCOUNTER — Encounter: Payer: Self-pay | Admitting: Obstetrics & Gynecology

## 2014-08-05 MED ORDER — ALBUTEROL SULFATE HFA 108 (90 BASE) MCG/ACT IN AERS: 2.0000 | INHALATION_SPRAY | Freq: Four times a day (QID) | RESPIRATORY_TRACT | Status: DC | PRN

## 2014-08-05 NOTE — Telephone Encounter (Signed)
Inhaler done erx

## 2014-08-06 ENCOUNTER — Encounter: Payer: Self-pay | Admitting: Internal Medicine

## 2014-08-06 ENCOUNTER — Ambulatory Visit (INDEPENDENT_AMBULATORY_CARE_PROVIDER_SITE_OTHER): Payer: 59 | Admitting: Internal Medicine

## 2014-08-06 VITALS — BP 118/70 | HR 88 | Temp 98.0°F | Ht 59.0 in | Wt 157.2 lb

## 2014-08-06 DIAGNOSIS — R7309 Other abnormal glucose: Secondary | ICD-10-CM

## 2014-08-06 DIAGNOSIS — J309 Allergic rhinitis, unspecified: Secondary | ICD-10-CM

## 2014-08-06 DIAGNOSIS — J4531 Mild persistent asthma with (acute) exacerbation: Secondary | ICD-10-CM

## 2014-08-06 MED ORDER — METHYLPREDNISOLONE ACETATE 80 MG/ML IJ SUSP
80.0000 mg | Freq: Once | INTRAMUSCULAR | Status: AC
  Administered 2014-08-06: 80 mg via INTRAMUSCULAR

## 2014-08-06 MED ORDER — PREDNISONE 10 MG PO TABS: ORAL_TABLET | ORAL | Status: DC

## 2014-08-06 MED ORDER — BUDESONIDE-FORMOTEROL FUMARATE 160-4.5 MCG/ACT IN AERO: 2.0000 | INHALATION_SPRAY | Freq: Two times a day (BID) | RESPIRATORY_TRACT | Status: DC

## 2014-08-06 NOTE — Progress Notes (Signed)
Pre visit review using our clinic review tool, if applicable. No additional management support is needed unless otherwise documented below in the visit note. 

## 2014-08-06 NOTE — Assessment & Plan Note (Signed)
Now mildpersistent, no evidence for infection, for dpeomedrol IM, predpac asd, symbicort asd, and refill proventil prn,  to f/u any worsening symptoms or concerns

## 2014-08-06 NOTE — Progress Notes (Signed)
   Subjective:    Patient ID: Andrea Daniels, female    DOB: 05/19/1991, 24 y.o.   MRN: [id number]  HPI  Here with 1 mo gradually worsening sob/wheezing episodes despite current alb MDI use, which has been more freq, No fever and Pt denies chest pain,  orthopnea, PND, increased LE swelling, palpitations, dizziness or syncope.  Cough is nonprod occasional.  Seems to worse at night to lie down. Does have several wks ongoing nasal allergy symptoms with clearish congestion, itch and sneezing, without fever, pain, ST, cough, swelling.  Pt denies polydipsia, polyuria Past Medical History  Diagnosis Date  . UTI (lower urinary tract infection)   . Asthma   . Gastritis   . Ovarian cyst    Past Surgical History  Procedure Laterality Date  . No past surgeries      reports that she has quit smoking. Her smoking use included Cigarettes. She smoked 0.10 packs per day. She has never used smokeless tobacco. She reports that she drinks alcohol. She reports that she does not use illicit drugs. family history includes Arthritis in her other; Cancer in her other; Diabetes in her other; Hyperlipidemia in her other; Hypertension in her other and other. No Known Allergies Current Outpatient Prescriptions on File Prior to Visit  Medication Sig Dispense Refill  . albuterol (PROVENTIL HFA;VENTOLIN HFA) 108 (90 BASE) MCG/ACT inhaler Inhale 2 puffs into the lungs every 6 (six) hours as needed for wheezing. 1 Inhaler 5   No current facility-administered medications on file prior to visit.   Review of Systems  Constitutional: Negative for unusual diaphoresis or other sweats  HENT: Negative for ringing in ear Eyes: Negative for double vision or worsening visual disturbance.  Respiratory: Negative for choking and stridor.   Gastrointestinal: Negative for vomiting or other signifcant bowel change Genitourinary: Negative for hematuria or decreased urine volume.  Musculoskeletal: Negative for other MSK pain or  swelling Skin: Negative for color change and worsening wound.  Neurological: Negative for tremors and numbness other than noted  Psychiatric/Behavioral: Negative for decreased concentration or agitation other than above       Objective:   Physical Exam BP 118/70 mmHg  Pulse 88  Temp(Src) 98 F (36.7 C) (Oral)  Ht 4\' 11"  (1.499 m)  Wt 157 lb 3.2 oz (71.305 kg)  BMI 31.73 kg/m2 VS noted, non toxic Constitutional: Pt appears well-developed, well-nourished.  HENT: Head: NCAT.  Right Ear: External ear normal.  Left Ear: External ear normal.  Eyes: . Pupils are equal, round, and reactive to light. Conjunctivae and EOM are normal Bilat tm's with mild erythema.  Max sinus areas non tender.  Pharynx with mild erythema, no exudate Neck: Normal range of motion. Neck supple.  Cardiovascular: Normal rate and regular rhythm.   Pulmonary/Chest: Effort normal and breath sounds decreased without rales but with bilat mild wheezing.  Neurological: Pt is alert. Not confused , motor grossly intact Skin: Skin is warm. No rash Psychiatric: Pt behavior is normal. No agitation.     Assessment & Plan:

## 2014-08-06 NOTE — Patient Instructions (Addendum)
You had the steroid shot today  Please take all new medication as prescribed - the low dose prednisone for a  Short course  Please take all new medication as prescribed  - Symbicort every day  Please continue all other medications as before, and refills have been done if requested - the Albuterol for as needed use  Please have the pharmacy call with any other refills you may need.  Please keep your appointments with your specialists as you may have planned  Please return in 6 months, or sooner if needed

## 2014-08-10 NOTE — Assessment & Plan Note (Signed)
Asympt,  Pt to call with onset polys or cbg > 200,  to f/u any worsening symptoms or concerns

## 2014-08-10 NOTE — Assessment & Plan Note (Signed)
Also to improve with tx as per asthma exac, then allegra otc prn,  to f/u any worsening symptoms or concerns

## 2014-08-14 MED ORDER — MOMETASONE FURO-FORMOTEROL FUM 200-5 MCG/ACT IN AERO: 2.0000 | INHALATION_SPRAY | Freq: Two times a day (BID) | RESPIRATORY_TRACT | Status: DC

## 2014-08-14 NOTE — Telephone Encounter (Signed)
Ok to try dulera - I have sent to pharmacy. thanks

## 2014-08-14 NOTE — Addendum Note (Signed)
Addended by: Corwin LevinsJOHN, Terren Haberle W on: 08/14/2014 09:26 AM   Modules accepted: Orders, Medications

## 2014-09-09 ENCOUNTER — Emergency Department (HOSPITAL_COMMUNITY)
Admission: EM | Admit: 2014-09-09 | Discharge: 2014-09-09 | Disposition: A | Discharge status: Home / Self Care | Payer: 59 | Attending: Emergency Medicine | Admitting: Emergency Medicine

## 2014-09-09 ENCOUNTER — Encounter (HOSPITAL_COMMUNITY): Payer: Self-pay | Admitting: Emergency Medicine

## 2014-09-09 DIAGNOSIS — Z87891 Personal history of nicotine dependence: Secondary | ICD-10-CM | POA: Diagnosis not present

## 2014-09-09 DIAGNOSIS — Z7952 Long term (current) use of systemic steroids: Secondary | ICD-10-CM | POA: Diagnosis not present

## 2014-09-09 DIAGNOSIS — R109 Unspecified abdominal pain: Secondary | ICD-10-CM | POA: Diagnosis not present

## 2014-09-09 DIAGNOSIS — Z791 Long term (current) use of non-steroidal anti-inflammatories (NSAID): Secondary | ICD-10-CM | POA: Insufficient documentation

## 2014-09-09 DIAGNOSIS — Z79899 Other long term (current) drug therapy: Secondary | ICD-10-CM | POA: Diagnosis not present

## 2014-09-09 DIAGNOSIS — R112 Nausea with vomiting, unspecified: Secondary | ICD-10-CM | POA: Diagnosis present

## 2014-09-09 DIAGNOSIS — R197 Diarrhea, unspecified: Secondary | ICD-10-CM | POA: Diagnosis not present

## 2014-09-09 DIAGNOSIS — K088 Other specified disorders of teeth and supporting structures: Secondary | ICD-10-CM | POA: Diagnosis not present

## 2014-09-09 DIAGNOSIS — Z3202 Encounter for pregnancy test, result negative: Secondary | ICD-10-CM | POA: Diagnosis not present

## 2014-09-09 LAB — URINALYSIS, ROUTINE W REFLEX MICROSCOPIC
Bilirubin Urine: NEGATIVE
Glucose, UA: NEGATIVE mg/dL
Ketones, ur: NEGATIVE mg/dL
Leukocytes, UA: NEGATIVE
Nitrite: NEGATIVE
Protein, ur: NEGATIVE mg/dL
Specific Gravity, Urine: 1.019 (ref 1.005–1.030)
Urobilinogen, UA: 0.2 mg/dL (ref 0.0–1.0)
pH: 6 (ref 5.0–8.0)

## 2014-09-09 LAB — COMPREHENSIVE METABOLIC PANEL
ALT: 11 U/L (ref 0–35)
AST: 19 U/L (ref 0–37)
Albumin: 4.2 g/dL (ref 3.5–5.2)
Alkaline Phosphatase: 67 U/L (ref 39–117)
Anion gap: 7 (ref 5–15)
BUN: 9 mg/dL (ref 6–23)
CO2: 23 mmol/L (ref 19–32)
Calcium: 8.6 mg/dL (ref 8.4–10.5)
Chloride: 105 mmol/L (ref 96–112)
Creatinine, Ser: 0.79 mg/dL (ref 0.50–1.10)
GFR calc Af Amer: >90 mL/min (ref >90)
GFR calc non Af Amer: >90 mL/min (ref >90)
Glucose, Bld: 105 mg/dL — ABNORMAL HIGH (ref 70–99)
Potassium: 3.9 mmol/L (ref 3.5–5.1)
Sodium: 135 mmol/L (ref 135–145)
Total Bilirubin: 0.5 mg/dL (ref 0.3–1.2)
Total Protein: 7.9 g/dL (ref 6.0–8.3)

## 2014-09-09 LAB — CBC WITH DIFFERENTIAL/PLATELET
Basophils Absolute: 0 10*3/uL (ref 0.0–0.1)
Basophils Relative: 1 % (ref 0–1)
Eosinophils Absolute: 0.3 10*3/uL (ref 0.0–0.7)
Eosinophils Relative: 5 % (ref 0–5)
HCT: 36.4 % (ref 36.0–46.0)
Hemoglobin: 12 g/dL (ref 12.0–15.0)
Lymphocytes Relative: 32 % (ref 12–46)
Lymphs Abs: 1.6 10*3/uL (ref 0.7–4.0)
MCH: 26.3 pg (ref 26.0–34.0)
MCHC: 33 g/dL (ref 30.0–36.0)
MCV: 79.6 fL (ref 78.0–100.0)
Monocytes Absolute: 0.3 10*3/uL (ref 0.1–1.0)
Monocytes Relative: 5 % (ref 3–12)
Neutro Abs: 2.9 10*3/uL (ref 1.7–7.7)
Neutrophils Relative %: 57 % (ref 43–77)
Platelets: 398 10*3/uL (ref 150–400)
RBC: 4.57 MIL/uL (ref 3.87–5.11)
RDW: 12.9 % (ref 11.5–15.5)
WBC: 5 10*3/uL (ref 4.0–10.5)

## 2014-09-09 LAB — URINE MICROSCOPIC-ADD ON

## 2014-09-09 LAB — LIPASE, BLOOD: Lipase: 22 U/L (ref 11–59)

## 2014-09-09 LAB — PREGNANCY, URINE: Preg Test, Ur: NEGATIVE

## 2014-09-09 MED ORDER — NAPROXEN 500 MG PO TABS: 500.0000 mg | ORAL_TABLET | Freq: Two times a day (BID) | ORAL | Status: DC

## 2014-09-09 NOTE — ED Notes (Signed)
Pt reports 2 episodes emesis and 6 episodes of diarrhea for past 3 hours. Pt L side upper and lower toothache.

## 2014-09-09 NOTE — ED Notes (Signed)
Per EMS-#A16. Pt called EMS with c/o NVD, and toothache x 2.5 hours. Pt is alert, oriented and appropriate. Pt is ambulatory

## 2014-09-09 NOTE — Discharge Instructions (Signed)
Abdominal Pain, Women °Abdominal (stomach, pelvic, or belly) pain can be caused by many things. It is important to tell your doctor: °· The location of the pain. °· Does it come and go or is it present all the time? °· Are there things that start the pain (eating certain foods, exercise)? °· Are there other symptoms associated with the pain (fever, nausea, vomiting, diarrhea)? °All of this is helpful to know when trying to find the cause of the pain. °CAUSES  °· Stomach: virus or bacteria infection, or ulcer. °· Intestine: appendicitis (inflamed appendix), regional ileitis (Crohn's disease), ulcerative colitis (inflamed colon), irritable bowel syndrome, diverticulitis (inflamed diverticulum of the colon), or cancer of the stomach or intestine. °· Gallbladder disease or stones in the gallbladder. °· Kidney disease, kidney stones, or infection. °· Pancreas infection or cancer. °· Fibromyalgia (pain disorder). °· Diseases of the female organs: °¨ Uterus: fibroid (non-cancerous) tumors or infection. °¨ Fallopian tubes: infection or tubal pregnancy. °¨ Ovary: cysts or tumors. °¨ Pelvic adhesions (scar tissue). °¨ Endometriosis (uterus lining tissue growing in the pelvis and on the pelvic organs). °¨ Pelvic congestion syndrome (female organs filling up with blood just before the menstrual period). °¨ Pain with the menstrual period. °¨ Pain with ovulation (producing an egg). °¨ Pain with an IUD (intrauterine device, birth control) in the uterus. °¨ Cancer of the female organs. °· Functional pain (pain not caused by a disease, may improve without treatment). °· Psychological pain. °· Depression. °DIAGNOSIS  °Your doctor will decide the seriousness of your pain by doing an examination. °· Blood tests. °· X-rays. °· Ultrasound. °· CT scan (computed tomography, special type of X-ray). °· MRI (magnetic resonance imaging). °· Cultures, for infection. °· Barium enema (dye inserted in the large intestine, to better view it with  X-rays). °· Colonoscopy (looking in intestine with a lighted tube). °· Laparoscopy (minor surgery, looking in abdomen with a lighted tube). °· Major abdominal exploratory surgery (looking in abdomen with a large incision). °TREATMENT  °The treatment will depend on the cause of the pain.  °· Many cases can be observed and treated at home. °· Over-the-counter medicines recommended by your caregiver. °· Prescription medicine. °· Antibiotics, for infection. °· Birth control pills, for painful periods or for ovulation pain. °· Hormone treatment, for endometriosis. °· Nerve blocking injections. °· Physical therapy. °· Antidepressants. °· Counseling with a psychologist or psychiatrist. °· Minor or major surgery. °HOME CARE INSTRUCTIONS  °· Do not take laxatives, unless directed by your caregiver. °· Take over-the-counter pain medicine only if ordered by your caregiver. Do not take aspirin because it can cause an upset stomach or bleeding. °· Try a clear liquid diet (broth or water) as ordered by your caregiver. Slowly move to a bland diet, as tolerated, if the pain is related to the stomach or intestine. °· Have a thermometer and take your temperature several times a day, and record it. °· Bed rest and sleep, if it helps the pain. °· Avoid sexual intercourse, if it causes pain. °· Avoid stressful situations. °· Keep your follow-up appointments and tests, as your caregiver orders. °· If the pain does not go away with medicine or surgery, you may try: °¨ Acupuncture. °¨ Relaxation exercises (yoga, meditation). °¨ Group therapy. °¨ Counseling. °SEEK MEDICAL CARE IF:  °· You notice certain foods cause stomach pain. °· Your home care treatment is not helping your pain. °· You need stronger pain medicine. °· You want your IUD removed. °· You feel faint or   lightheaded.  You develop nausea and vomiting.  You develop a rash.  You are having side effects or an allergy to your medicine. SEEK IMMEDIATE MEDICAL CARE IF:   Your  pain does not go away or gets worse.  You have a fever.  Your pain is felt only in portions of the abdomen. The right side could possibly be appendicitis. The left lower portion of the abdomen could be colitis or diverticulitis.  You are passing blood in your stools (bright red or black tarry stools, with or without vomiting).  You have blood in your urine.  You develop chills, with or without a fever.  You pass out. MAKE SURE YOU:   Understand these instructions.  Will watch your condition.  Will get help right away if you are not doing well or get worse. Document Released: 04/10/2007 Document Revised: 10/28/2013 Document Reviewed: 04/30/2009 [facility] Patient Information 2015 [place], [place]. This information is not intended to replace advice given to you by your health care provider. Make sure you discuss any questions you have with your health care provider.  You were evaluated in ED feet today for your abdominal pain. There does not appear to be an emergent cause for your symptoms at this time. Please take your medications as directed for your abdominal discomfort. You also need to follow-up with your primary care for further evaluation and management of your symptoms. Return to ED for new or worsening symptoms.

## 2014-09-09 NOTE — ED Provider Notes (Signed)
CSN: [id number]     Arrival date & time 09/09/14  1232 History  This chart was scribed for non-physician practitioner, Joycie Peek, PA-C, working with Richardean Canal, MD, by Ronney Lion, ED Scribe. This patient was seen in room WTR9/WTR9 and the patient's care was started at 2:21 PM.    Chief Complaint  Patient presents with  . Dental Pain  . Nausea  . Emesis   The history is provided by the patient. No language interpreter was used.    HPI Comments: Andrea Daniels is a 24 y.o. female who presents to the Emergency Department complaining of nausea, 2 episodes of vomiting clear fluids, diarrhea, right-sided abdominal pain, and a toothache that all began this morning when patient was waking up from sleep. She describes her abdominal pain as a "stretching" sensation and rates the severity currently as 5/10, with pain radiating to her back. She states she has been having normal BMs until this morning. She endorses vaginal discharge but states this is baseline. Patient has taken Advil, with her last dose yesterday, with no relief. Nothing makes her abdominal pain better; standing up straight makes it worse. Patient has a history of gastritis but states this does not feel like gastritis. She denies a history of abdominal surgeries. She denies hematemesis, pelvic pain, or any urinary symptoms. Her LMP was about 6 weeks ago, although she denies a possibility of pregnancy as she is sexually active with a female.  Past Medical History  Diagnosis Date  . UTI (lower urinary tract infection)   . Asthma   . Gastritis   . Ovarian cyst   . Gastritis    Past Surgical History  Procedure Laterality Date  . No past surgeries     Family History  Problem Relation Age of Onset  . Hyperlipidemia Other   . Hypertension Other   . Arthritis Other   . Cancer Other     breast cancer  . Hypertension Other   . Diabetes Other    History  Substance Use Topics  . Smoking status: Former Smoker -- 0.10 packs/day     Types: Cigarettes  . Smokeless tobacco: Never Used  . Alcohol Use: Yes     Comment: 1-5 cans a week   OB History    Gravida Para Term Preterm AB TAB SAB Ectopic Multiple Living   0              Review of Systems  HENT: Positive for dental problem.   Gastrointestinal: Positive for nausea, vomiting, abdominal pain and diarrhea.  Genitourinary: Negative for dysuria, urgency, frequency, hematuria, decreased urine volume, vaginal bleeding, vaginal discharge, difficulty urinating and pelvic pain.  All other systems reviewed and are negative.   Allergies  Review of patient's allergies indicates no known allergies.  Home Medications   Prior to Admission medications   Medication Sig Start Date End Date Taking? Authorizing Provider  albuterol (PROVENTIL HFA;VENTOLIN HFA) 108 (90 BASE) MCG/ACT inhaler Inhale 2 puffs into the lungs every 6 (six) hours as needed for wheezing. 08/05/14  Yes Corwin Levins, MD  guaiFENesin (MUCINEX) 600 MG 12 hr tablet Take 600 mg by mouth once.   Yes Historical Provider, MD  predniSONE (DELTASONE) 10 MG tablet 3 tabs by mouth per day for 3 days,2tabs per day for 3 days,1tab per day for 3 days 08/06/14  Yes Corwin Levins, MD  budesonide-formoterol [facility]) 160-4.5 MCG/ACT inhaler Inhale 2 puffs into the lungs 2 (two) times daily. Patient not taking:  Reported on 09/09/2014 08/06/14   Corwin LevinsJames W John, MD  famotidine (PEPCID) 20 MG tablet Take 20 mg by mouth 2 (two) times daily.    Historical Provider, MD  mometasone-formoterol (DULERA) 200-5 MCG/ACT AERO Inhale 2 puffs into the lungs 2 (two) times daily. Patient not taking: Reported on 09/09/2014 08/14/14   Corwin LevinsJames W John, MD  naproxen (NAPROSYN) 500 MG tablet Take 1 tablet (500 mg total) by mouth 2 (two) times daily. 09/09/14   Ora Bollig, PA-C   BP 145/110 mmHg  Pulse 71  Temp(Src) 98 F (36.7 C) (Oral)  Resp 20  SpO2 98% Physical Exam  Constitutional: She is oriented to person, place, and time. She appears  well-developed and well-nourished. No distress.  HENT:  Head: Normocephalic and atraumatic.  Eyes: Conjunctivae and EOM are normal.  Neck: Neck supple. No tracheal deviation present.  Cardiovascular: Normal rate, regular rhythm and normal heart sounds.   Pulmonary/Chest: Effort normal. No respiratory distress.  Lungs are clear to auscultation bilaterally.  Abdominal: Soft. She exhibits no distension. There is tenderness.  Mild discomfort diffusely in epigastrium and midline. No overt RLQ tenderness. Positive Carnett's sign. Negative McBurney's, negative Rovsing's, negative obturator, negative Murphy's. No other lesions, rashes, or other deformities noted. No CVA tenderness bilaterally.  Musculoskeletal: Normal range of motion.  Neurological: She is alert and oriented to person, place, and time.  Skin: Skin is warm and dry.  Psychiatric: She has a normal mood and affect. Her behavior is normal.  Nursing note and vitals reviewed.   ED Course  Procedures (including critical care time)  DIAGNOSTIC STUDIES: Oxygen Saturation is 97% on room air, normal by my interpretation.    COORDINATION OF CARE: 2:30 PM - Discussed treatment plan with pt at bedside which includes lab tests, and pt agreed to plan.   Labs Review Labs Reviewed  COMPREHENSIVE METABOLIC PANEL - Abnormal; Notable for the following:    Glucose, Bld 105 (*)    All other components within normal limits  URINALYSIS, ROUTINE W REFLEX MICROSCOPIC - Abnormal; Notable for the following:    APPearance CLOUDY (*)    Hgb urine dipstick LARGE (*)    All other components within normal limits  URINE MICROSCOPIC-ADD ON - Abnormal; Notable for the following:    Squamous Epithelial / LPF FEW (*)    All other components within normal limits  CBC WITH DIFFERENTIAL/PLATELET  LIPASE, BLOOD  PREGNANCY, URINE    Imaging Review No results found.   EKG Interpretation None     Meds given in ED:  Medications - No data to  display  Discharge Medication List as of 09/09/2014  5:20 PM    START taking these medications   Details  naproxen (NAPROSYN) 500 MG tablet Take 1 tablet (500 mg total) by mouth 2 (two) times daily., Starting 09/09/2014, Until Discontinued, Print       Filed Vitals:   09/09/14 1233 09/09/14 1234 09/09/14 1736  BP: 145/110    Pulse: 87  71  Temp: 98 F (36.7 C)    TempSrc: Oral    Resp: 20    SpO2: 99% 97% 98%    MDM  Vitals stable - WNL -afebrile Pt resting comfortably in ED. PE-likely musculoskeletal discomfort to right rectus abdominis. No evidence of acute or surgical abdomen. Labwork noncontributory. However, there is hemoglobin in the urine. This appears to be chronic for patient. No evidence of UTI. Pregnancy negative.  Upon multiple rechecks, patient is resting comfortably in ED in no apparent  distress playing on phone. Will DC with anti-inflammatories and instructions to return if symptoms worsen or she experiences new symptoms.. Discussed follow-up with PCP for further evaluation of hemoglobin in urine. I discussed all relevant lab findings and imaging results with pt and they verbalized understanding. Discussed f/u with PCP within 48 hrs and return precautions, pt very amenable to plan. Patient stable, in good condition and is appropriate for discharge  Final diagnoses:  Abdominal discomfort   I personally performed the services described in this documentation, which was scribed in my presence. The recorded information has been reviewed and is accurate.    Joycie Peek, PA-C 09/09/14 2023  Richardean Canal, MD 09/10/14 0700

## 2014-10-07 NOTE — Telephone Encounter (Signed)
I'm not sure which you mean, since there are 3 listed on your med list  Dulera, symbicort and Proventil  Cherina to contact pt - which does pt mean?

## 2014-10-08 MED ORDER — ALBUTEROL SULFATE HFA 108 (90 BASE) MCG/ACT IN AERS: 2.0000 | INHALATION_SPRAY | Freq: Four times a day (QID) | RESPIRATORY_TRACT | Status: DC | PRN

## 2014-10-08 NOTE — Addendum Note (Signed)
Addended by: Corwin LevinsJOHN, Asiyah Pineau W on: 10/08/2014 12:08 PM   Modules accepted: Orders

## 2014-11-24 ENCOUNTER — Encounter (HOSPITAL_COMMUNITY): Payer: Self-pay

## 2014-11-24 ENCOUNTER — Encounter: Payer: Self-pay | Admitting: Internal Medicine

## 2014-11-24 ENCOUNTER — Emergency Department (HOSPITAL_COMMUNITY): Payer: 59

## 2014-11-24 ENCOUNTER — Emergency Department (HOSPITAL_COMMUNITY)
Admission: EM | Admit: 2014-11-24 | Discharge: 2014-11-24 | Disposition: A | Discharge status: Home / Self Care | Payer: 59 | Attending: Emergency Medicine | Admitting: Emergency Medicine

## 2014-11-24 DIAGNOSIS — J40 Bronchitis, not specified as acute or chronic: Secondary | ICD-10-CM

## 2014-11-24 DIAGNOSIS — R110 Nausea: Secondary | ICD-10-CM | POA: Insufficient documentation

## 2014-11-24 DIAGNOSIS — R42 Dizziness and giddiness: Secondary | ICD-10-CM | POA: Insufficient documentation

## 2014-11-24 DIAGNOSIS — R1032 Left lower quadrant pain: Secondary | ICD-10-CM | POA: Diagnosis not present

## 2014-11-24 DIAGNOSIS — Z7951 Long term (current) use of inhaled steroids: Secondary | ICD-10-CM | POA: Insufficient documentation

## 2014-11-24 DIAGNOSIS — Z87440 Personal history of urinary (tract) infections: Secondary | ICD-10-CM | POA: Diagnosis not present

## 2014-11-24 DIAGNOSIS — Z8719 Personal history of other diseases of the digestive system: Secondary | ICD-10-CM | POA: Diagnosis not present

## 2014-11-24 DIAGNOSIS — Z791 Long term (current) use of non-steroidal anti-inflammatories (NSAID): Secondary | ICD-10-CM | POA: Insufficient documentation

## 2014-11-24 DIAGNOSIS — Z8742 Personal history of other diseases of the female genital tract: Secondary | ICD-10-CM | POA: Insufficient documentation

## 2014-11-24 DIAGNOSIS — Z79899 Other long term (current) drug therapy: Secondary | ICD-10-CM | POA: Diagnosis not present

## 2014-11-24 DIAGNOSIS — Z3202 Encounter for pregnancy test, result negative: Secondary | ICD-10-CM | POA: Diagnosis not present

## 2014-11-24 DIAGNOSIS — J45901 Unspecified asthma with (acute) exacerbation: Secondary | ICD-10-CM | POA: Diagnosis not present

## 2014-11-24 DIAGNOSIS — J45909 Unspecified asthma, uncomplicated: Secondary | ICD-10-CM | POA: Diagnosis present

## 2014-11-24 DIAGNOSIS — R197 Diarrhea, unspecified: Secondary | ICD-10-CM | POA: Diagnosis not present

## 2014-11-24 DIAGNOSIS — Z87891 Personal history of nicotine dependence: Secondary | ICD-10-CM | POA: Diagnosis not present

## 2014-11-24 LAB — URINALYSIS, ROUTINE W REFLEX MICROSCOPIC
Bilirubin Urine: NEGATIVE
Glucose, UA: NEGATIVE mg/dL
Hgb urine dipstick: NEGATIVE
Ketones, ur: 15 mg/dL — AB
Leukocytes, UA: NEGATIVE
Nitrite: NEGATIVE
Protein, ur: NEGATIVE mg/dL
Specific Gravity, Urine: 1.024 (ref 1.005–1.030)
Urobilinogen, UA: 0.2 mg/dL (ref 0.0–1.0)
pH: 5.5 (ref 5.0–8.0)

## 2014-11-24 LAB — WET PREP, GENITAL
Trich, Wet Prep: NONE SEEN
WBC, Wet Prep HPF POC: NONE SEEN
Yeast Wet Prep HPF POC: NONE SEEN

## 2014-11-24 LAB — CBC WITH DIFFERENTIAL/PLATELET
Basophils Absolute: 0 10*3/uL (ref 0.0–0.1)
Basophils Relative: 0 % (ref 0–1)
Eosinophils Absolute: 0 10*3/uL (ref 0.0–0.7)
Eosinophils Relative: 0 % (ref 0–5)
HCT: 35.5 % — ABNORMAL LOW (ref 36.0–46.0)
Hemoglobin: 11.7 g/dL — ABNORMAL LOW (ref 12.0–15.0)
Lymphocytes Relative: 5 % — ABNORMAL LOW (ref 12–46)
Lymphs Abs: 0.6 10*3/uL — ABNORMAL LOW (ref 0.7–4.0)
MCH: 25.2 pg — ABNORMAL LOW (ref 26.0–34.0)
MCHC: 33 g/dL (ref 30.0–36.0)
MCV: 76.5 fL — ABNORMAL LOW (ref 78.0–100.0)
Monocytes Absolute: 0.2 10*3/uL (ref 0.1–1.0)
Monocytes Relative: 2 % — ABNORMAL LOW (ref 3–12)
Neutro Abs: 10 10*3/uL — ABNORMAL HIGH (ref 1.7–7.7)
Neutrophils Relative %: 93 % — ABNORMAL HIGH (ref 43–77)
Platelets: 363 10*3/uL (ref 150–400)
RBC: 4.64 MIL/uL (ref 3.87–5.11)
RDW: 14.2 % (ref 11.5–15.5)
WBC: 10.8 10*3/uL — ABNORMAL HIGH (ref 4.0–10.5)

## 2014-11-24 LAB — COMPREHENSIVE METABOLIC PANEL
ALT: 16 U/L (ref 14–54)
AST: 25 U/L (ref 15–41)
Albumin: 3.8 g/dL (ref 3.5–5.0)
Alkaline Phosphatase: 64 U/L (ref 38–126)
Anion gap: 9 (ref 5–15)
BUN: 10 mg/dL (ref 6–20)
CO2: 23 mmol/L (ref 22–32)
Calcium: 8.3 mg/dL — ABNORMAL LOW (ref 8.9–10.3)
Chloride: 107 mmol/L (ref 101–111)
Creatinine, Ser: 1.01 mg/dL — ABNORMAL HIGH (ref 0.44–1.00)
GFR calc Af Amer: >60 mL/min (ref >60)
GFR calc non Af Amer: >60 mL/min (ref >60)
Glucose, Bld: 122 mg/dL — ABNORMAL HIGH (ref 65–99)
Potassium: 3.1 mmol/L — ABNORMAL LOW (ref 3.5–5.1)
Sodium: 139 mmol/L (ref 135–145)
Total Bilirubin: 0.9 mg/dL (ref 0.3–1.2)
Total Protein: 7.7 g/dL (ref 6.5–8.1)

## 2014-11-24 LAB — I-STAT TROPONIN, ED: Troponin i, poc: 0 ng/mL (ref 0.00–0.08)

## 2014-11-24 LAB — RAPID STREP SCREEN (MED CTR MEBANE ONLY): Streptococcus, Group A Screen (Direct): NEGATIVE

## 2014-11-24 LAB — POC URINE PREG, ED: Preg Test, Ur: NEGATIVE

## 2014-11-24 LAB — LIPASE, BLOOD: Lipase: 22 U/L (ref 22–51)

## 2014-11-24 MED ORDER — PREDNISONE 20 MG PO TABS: ORAL_TABLET | ORAL | Status: DC

## 2014-11-24 MED ORDER — AZITHROMYCIN 250 MG PO TABS: 250.0000 mg | ORAL_TABLET | Freq: Every day | ORAL | Status: DC

## 2014-11-24 MED ORDER — ALBUTEROL SULFATE HFA 108 (90 BASE) MCG/ACT IN AERS
2.0000 | INHALATION_SPRAY | Freq: Once | RESPIRATORY_TRACT | Status: AC
  Administered 2014-11-24: 2 via RESPIRATORY_TRACT
  Filled 2014-11-24: qty 6.7

## 2014-11-24 MED ORDER — KETOROLAC TROMETHAMINE 30 MG/ML IJ SOLN
30.0000 mg | Freq: Once | INTRAMUSCULAR | Status: AC
  Administered 2014-11-24: 30 mg via INTRAVENOUS
  Filled 2014-11-24: qty 1

## 2014-11-24 MED ORDER — SODIUM CHLORIDE 0.9 % IV BOLUS (SEPSIS)
1000.0000 mL | Freq: Once | INTRAVENOUS | Status: AC
Start: 2014-11-24 — End: 2014-11-24
  Administered 2014-11-24: 1000 mL via INTRAVENOUS

## 2014-11-24 MED ORDER — FAMOTIDINE IN NACL 20-0.9 MG/50ML-% IV SOLN
20.0000 mg | INTRAVENOUS | Status: AC
  Administered 2014-11-24: 20 mg via INTRAVENOUS
  Filled 2014-11-24: qty 50

## 2014-11-24 MED ORDER — GI COCKTAIL ~~LOC~~
30.0000 mL | Freq: Once | ORAL | Status: AC
  Administered 2014-11-24: 30 mL via ORAL
  Filled 2014-11-24: qty 30

## 2014-11-24 MED ORDER — IPRATROPIUM-ALBUTEROL 0.5-2.5 (3) MG/3ML IN SOLN
3.0000 mL | Freq: Once | RESPIRATORY_TRACT | Status: AC
  Administered 2014-11-24: 3 mL via RESPIRATORY_TRACT
  Filled 2014-11-24: qty 3

## 2014-11-24 MED ORDER — POTASSIUM CHLORIDE CRYS ER 20 MEQ PO TBCR
40.0000 meq | EXTENDED_RELEASE_TABLET | Freq: Once | ORAL | Status: AC
  Administered 2014-11-24: 40 meq via ORAL
  Filled 2014-11-24: qty 2

## 2014-11-24 NOTE — Discharge Instructions (Signed)
Please call your doctor for a followup appointment within 24-48 hours. When you talk to your doctor please let them know that you were seen in the emergency department and have them acquire all of your records so that they can discuss the findings with you and formulate a treatment plan to fully care for your new and ongoing problems. Please follow-up with primary care provider, please be reassessed early this week Please rest and stay hydrated Please avoid any physical or strenuous activity Please take medications as prescribed Please continue to monitor symptoms closely and if symptoms are to worsen or change (fever greater than 101, chills, sweating, nausea, vomiting, chest pain, shortness of breathe, difficulty breathing, weakness, numbness, tingling, worsening or changes to pain pattern, abnormal vaginal bleeding, abnormal vaginal discharge, worsening or changes to stomach pain, back pain, inability to food or fluids down, worsening cough, coughing up blood, visual changes, headache, dizziness, fainting, throat closing sensation, tightness to the chest, wheezes) please report back to the Emergency Department immediately.   Asthma Asthma is a recurring condition in which the airways tighten and narrow. Asthma can make it difficult to breathe. It can cause coughing, wheezing, and shortness of breath. Asthma episodes, also called asthma attacks, range from minor to life-threatening. Asthma cannot be cured, but medicines and lifestyle changes can help control it. CAUSES Asthma is believed to be caused by inherited (genetic) and environmental factors, but its exact cause is unknown. Asthma may be triggered by allergens, lung infections, or irritants in the air. Asthma triggers are different for each person. Common triggers include:   Animal dander.  Dust mites.  Cockroaches.  Pollen from trees or grass.  Mold.  Smoke.  Air pollutants such as dust, household cleaners, hair sprays, aerosol  sprays, paint fumes, strong chemicals, or strong odors.  Cold air, weather changes, and winds (which increase molds and pollens in the air).  Strong emotional expressions such as crying or laughing hard.  Stress.  Certain medicines (such as aspirin) or types of drugs (such as beta-blockers).  Sulfites in foods and drinks. Foods and drinks that may contain sulfites include dried fruit, potato chips, and sparkling grape juice.  Infections or inflammatory conditions such as the flu, a cold, or an inflammation of the nasal membranes (rhinitis).  Gastroesophageal reflux disease (GERD).  Exercise or strenuous activity. SYMPTOMS Symptoms may occur immediately after asthma is triggered or many hours later. Symptoms include:  Wheezing.  Excessive nighttime or early morning coughing.  Frequent or severe coughing with a common cold.  Chest tightness.  Shortness of breath. DIAGNOSIS  The diagnosis of asthma is made by a review of your medical history and a physical exam. Tests may also be performed. These may include:  Lung function studies. These tests show how much air you breathe in and out.  Allergy tests.  Imaging tests such as X-rays. TREATMENT  Asthma cannot be cured, but it can usually be controlled. Treatment involves identifying and avoiding your asthma triggers. It also involves medicines. There are 2 classes of medicine used for asthma treatment:   Controller medicines. These prevent asthma symptoms from occurring. They are usually taken every day.  Reliever or rescue medicines. These quickly relieve asthma symptoms. They are used as needed and provide short-term relief. Your health care provider will help you create an asthma action plan. An asthma action plan is a written plan for managing and treating your asthma attacks. It includes a list of your asthma triggers and how they may be  avoided. It also includes information on when medicines should be taken and when their  dosage should be changed. An action plan may also involve the use of a device called a peak flow meter. A peak flow meter measures how well the lungs are working. It helps you monitor your condition. HOME CARE INSTRUCTIONS   Take medicines only as directed by your health care provider. Speak with your health care provider if you have questions about how or when to take the medicines.  Use a peak flow meter as directed by your health care provider. Record and keep track of readings.  Understand and use the action plan to help minimize or stop an asthma attack without needing to seek medical care.  Control your home environment in the following ways to help prevent asthma attacks:  Do not smoke. Avoid being exposed to secondhand smoke.  Change your heating and air conditioning filter regularly.  Limit your use of fireplaces and wood stoves.  Get rid of pests (such as roaches and mice) and their droppings.  Throw away plants if you see mold on them.  Clean your floors and dust regularly. Use unscented cleaning products.  Try to have someone else vacuum for you regularly. Stay out of rooms while they are being vacuumed and for a short while afterward. If you vacuum, use a dust mask from a hardware store, a double-layered or microfilter vacuum cleaner bag, or a vacuum cleaner with a HEPA filter.  Replace carpet with wood, tile, or vinyl flooring. Carpet can trap dander and dust.  Use allergy-proof pillows, mattress covers, and box spring covers.  Wash bed sheets and blankets every week in hot water and dry them in a dryer.  Use blankets that are made of polyester or cotton.  Clean bathrooms and kitchens with bleach. If possible, have someone repaint the walls in these rooms with mold-resistant paint. Keep out of the rooms that are being cleaned and painted.  Wash hands frequently. SEEK MEDICAL CARE IF:   You have wheezing, shortness of breath, or a cough even if taking medicine to  prevent attacks.  The colored mucus you cough up (sputum) is thicker than usual.  Your sputum changes from clear or white to yellow, green, gray, or bloody.  You have any problems that may be related to the medicines you are taking (such as a rash, itching, swelling, or trouble breathing).  You are using a reliever medicine more than 2-3 times per week.  Your peak flow is still at 50-79% of your personal best after following your action plan for 1 hour.  You have a fever. SEEK IMMEDIATE MEDICAL CARE IF:   You seem to be getting worse and are unresponsive to treatment during an asthma attack.  You are short of breath even at rest.  You get short of breath when doing very little physical activity.  You have difficulty eating, drinking, or talking due to asthma symptoms.  You develop chest pain.  You develop a fast heartbeat.  You have a bluish color to your lips or fingernails.  You are light-headed, dizzy, or faint.  Your peak flow is less than 50% of your personal best. MAKE SURE YOU:   Understand these instructions.  Will watch your condition.  Will get help right away if you are not doing well or get worse. Document Released: 06/13/2005 Document Revised: 10/28/2013 Document Reviewed: 01/10/2013 [facility] Patient Information 2015 [place], [place]. This information is not intended to replace advice given to you  by your health care provider. Make sure you discuss any questions you have with your health care provider. Acute Bronchitis Bronchitis is inflammation of the airways that extend from the windpipe into the lungs (bronchi). The inflammation often causes mucus to develop. This leads to a cough, which is the most common symptom of bronchitis.  In acute bronchitis, the condition usually develops suddenly and goes away over time, usually in a couple weeks. Smoking, allergies, and asthma can make bronchitis worse. Repeated episodes of bronchitis may cause further lung  problems.  CAUSES Acute bronchitis is most often caused by the same virus that causes a cold. The virus can spread from person to person (contagious) through coughing, sneezing, and touching contaminated objects. SIGNS AND SYMPTOMS   Cough.   Fever.   Coughing up mucus.   Body aches.   Chest congestion.   Chills.   Shortness of breath.   Sore throat.  DIAGNOSIS  Acute bronchitis is usually diagnosed through a physical exam. Your health care provider will also ask you questions about your medical history. Tests, such as chest X-rays, are sometimes done to rule out other conditions.  TREATMENT  Acute bronchitis usually goes away in a couple weeks. Oftentimes, no medical treatment is necessary. Medicines are sometimes given for relief of fever or cough. Antibiotic medicines are usually not needed but may be prescribed in certain situations. In some cases, an inhaler may be recommended to help reduce shortness of breath and control the cough. A cool mist vaporizer may also be used to help thin bronchial secretions and make it easier to clear the chest.  HOME CARE INSTRUCTIONS  Get plenty of rest.   Drink enough fluids to keep your urine clear or pale yellow (unless you have a medical condition that requires fluid restriction). Increasing fluids may help thin your respiratory secretions (sputum) and reduce chest congestion, and it will prevent dehydration.   Take medicines only as directed by your health care provider.  If you were prescribed an antibiotic medicine, finish it all even if you start to feel better.  Avoid smoking and secondhand smoke. Exposure to cigarette smoke or irritating chemicals will make bronchitis worse. If you are a smoker, consider using nicotine gum or skin patches to help control withdrawal symptoms. Quitting smoking will help your lungs heal faster.   Reduce the chances of another bout of acute bronchitis by washing your hands frequently,  avoiding people with cold symptoms, and trying not to touch your hands to your mouth, nose, or eyes.   Keep all follow-up visits as directed by your health care provider.  SEEK MEDICAL CARE IF: Your symptoms do not improve after 1 week of treatment.  SEEK IMMEDIATE MEDICAL CARE IF:  You develop an increased fever or chills.   You have chest pain.   You have severe shortness of breath.  You have bloody sputum.   You develop dehydration.  You faint or repeatedly feel like you are going to pass out.  You develop repeated vomiting.  You develop a severe headache. MAKE SURE YOU:   Understand these instructions.  Will watch your condition.  Will get help right away if you are not doing well or get worse. Document Released: 07/21/2004 Document Revised: 10/28/2013 Document Reviewed: 12/04/2012 [facility] Patient Information 2015 [place], [place]. This information is not intended to replace advice given to you by your health care provider. Make sure you discuss any questions you have with your health care provider.

## 2014-11-24 NOTE — ED Notes (Signed)
MD at bedside. 

## 2014-11-24 NOTE — ED Provider Notes (Signed)
CSN: [id number]     Arrival date & time 11/24/14  0451 History   First MD Initiated Contact with Patient 11/24/14 [phone]     Chief Complaint  Patient presents with  . Asthma     (Consider location/radiation/quality/duration/timing/severity/associated sxs/prior Treatment) The history is provided by the patient. No language interpreter was used.  Andrea Daniels is a 24 y/o F with PMHx of UTI, asthma, gastritis, ovarian cyst presenting to the ED with asthma attack that started yesterday morning. Reported that she has been having a productive cough of green phlegm and nasal congestion for the past couple of days. Patient reported that when she woke up she was having wheezing, stated that she was going to use her albuterol inhaler, but stated that it was empty. Patient reported that she was resting and laying down - stated that she started to have worsening symptoms at approximately 6:00PM yesterday evening. Reported that she was having chest tightness, burning in her throat after coughing. Patient reported that she thought this could also be associated with her GERD - stated that she used pepcid which did not help. Then she reported that she used Tussonex and stated that she started to get nausea, had one episode of diarrhea that resolved. Stated that she also started to develop mild left lower quadrant pain associated with coughing, but resolves after coughing. Stated that she has been having vaginal discharge as well - stated that she normally has some form of vaginal discharge, reported that the normal vaginal discharge has not changed. Mother reported that she called EMS. En route to the ED, patient given Duoneb, Solumedrol, and zofran. Stated that when she has these asthma attacks this is how she normally presents. Stated that she has had to be admitted before, but denied ever having to be intubated. Denied vomiting, dysuria, hematuria, vaginal bleeding, fever, dizziness, fainting, changes to appetite,  melena, hematochezia, neck pain, neck stiffness, hemoptysis, unilateral leg swelling, travel, surgery, sick contacts. Denied birth control. LMP 11/01/2014.  PCP Dr. Excell Seltzer  Past Medical History  Diagnosis Date  . UTI (lower urinary tract infection)   . Asthma   . Gastritis   . Ovarian cyst   . Gastritis    Past Surgical History  Procedure Laterality Date  . No past surgeries     Family History  Problem Relation Age of Onset  . Hyperlipidemia Other   . Hypertension Other   . Arthritis Other   . Cancer Other     breast cancer  . Hypertension Other   . Diabetes Other    History  Substance Use Topics  . Smoking status: Former Smoker -- 0.10 packs/day    Types: Cigarettes  . Smokeless tobacco: Never Used  . Alcohol Use: Yes     Comment: 1-5 cans a week   OB History    Gravida Para Term Preterm AB TAB SAB Ectopic Multiple Living   0              Review of Systems  Constitutional: Negative for fever and chills.  HENT: Positive for congestion and sore throat. Negative for trouble swallowing.   Respiratory: Positive for cough, chest tightness and shortness of breath.   Cardiovascular: Negative for chest pain.  Gastrointestinal: Positive for nausea, abdominal pain and diarrhea. Negative for vomiting, constipation, blood in stool and anal bleeding.  Genitourinary: Negative for dysuria, hematuria, vaginal bleeding, vaginal discharge and vaginal pain.  Musculoskeletal: Negative for neck pain and neck stiffness.  Neurological: Positive for  light-headedness. Negative for dizziness and weakness.      Allergies  Review of patient's allergies indicates no known allergies.  Home Medications   Prior to Admission medications   Medication Sig Start Date End Date Taking? Authorizing Provider  albuterol (PROVENTIL HFA;VENTOLIN HFA) 108 (90 BASE) MCG/ACT inhaler Inhale 2 puffs into the lungs every 6 (six) hours as needed for wheezing. 10/08/14  Yes Corwin Levins, MD  famotidine  (PEPCID) 20 MG tablet Take 20 mg by mouth 2 (two) times daily.   Yes Historical Provider, MD  azithromycin (ZITHROMAX) 250 MG tablet Take 1 tablet (250 mg total) by mouth daily. Take first 2 tablets together, then 1 every day until finished. 11/24/14   Kalifa Cadden, PA-C  budesonide-formoterol (SYMBICORT) 160-4.5 MCG/ACT inhaler Inhale 2 puffs into the lungs 2 (two) times daily. Patient not taking: Reported on 09/09/2014 08/06/14   Corwin Levins, MD  mometasone-formoterol [facility]) 200-5 MCG/ACT AERO Inhale 2 puffs into the lungs 2 (two) times daily. Patient not taking: Reported on 09/09/2014 08/14/14   Corwin Levins, MD  naproxen (NAPROSYN) 500 MG tablet Take 1 tablet (500 mg total) by mouth 2 (two) times daily. Patient not taking: Reported on 11/24/2014 09/09/14   Joycie Peek, PA-C  predniSONE (DELTASONE) 20 MG tablet 3 tabs po day one, then 2 tabs daily x 4 days 11/24/14   Maria Gallicchio, PA-C   BP 123/81 mmHg  Pulse 86  Temp(Src) 98.1 F (36.7 C) (Oral)  Resp 17  Ht 4\' 11"  (1.499 m)  Wt 156 lb (70.761 kg)  BMI 31.49 kg/m2  SpO2 99%  LMP 10/02/2014 Physical Exam  Constitutional: She is oriented to person, place, and time. She appears well-developed and well-nourished. No distress.  HENT:  Head: Normocephalic and atraumatic.  Mouth/Throat: Oropharynx is clear and moist. No oropharyngeal exudate.  Eyes: Conjunctivae and EOM are normal. Pupils are equal, round, and reactive to light. Right eye exhibits no discharge. Left eye exhibits no discharge.  Neck: Normal range of motion. Neck supple. No tracheal deviation present.  Negative neck stiffness Negative nuchal rigidity  Negative cervical lymphadenopathy  Negative meningeal signs   Cardiovascular: Normal rate, regular rhythm and normal heart sounds.  Exam reveals no friction rub.   No murmur heard. Pulses:      Radial pulses are 2+ on the right side, and 2+ on the left side.       Dorsalis pedis pulses are 2+ on the right side, and  2+ on the left side.  Negative swelling or pitting edema noted to the lower extremities bilaterally   Pulmonary/Chest: Effort normal and breath sounds normal. No respiratory distress. She has no wheezes. She has no rales. She exhibits tenderness (Pain reproducible upon palpation to the chest wall ).  Patient is able to speak in full sentences without difficulty  Negative use of accessory muscles Negative stridor  Abdominal: Soft. Bowel sounds are normal. She exhibits no distension. There is tenderness (mild to the LLQ). There is no rebound and no guarding.  Genitourinary:  Pelvic exam: Negative swelling, erythema, inflammation, lesions, sores, deformities identified to the external genitalia. Negative lesions, sores, ulcerations identified to the vaginal canal. Negative bright red blood in the vaginal vault. Negative discharge noted. Negative CMT. Left adnexal tenderness noted with negative right adnexal tenderness Exam chaperoned with tech, Noreene Larsson  Musculoskeletal: Normal range of motion.  Lymphadenopathy:    She has no cervical adenopathy.  Neurological: She is alert and oriented to person, place, and time. No  cranial nerve deficit. She exhibits normal muscle tone. Coordination normal. GCS eye subscore is 4. GCS verbal subscore is 5. GCS motor subscore is 6.  Skin: Skin is warm and dry. No rash noted. She is not diaphoretic. No erythema.  Psychiatric: She has a normal mood and affect. Her behavior is normal. Thought content normal.  Nursing note and vitals reviewed.   ED Course  Procedures (including critical care time)  Results for orders placed or performed during the hospital encounter of 11/24/14  Rapid strep screen  Result Value Ref Range   Streptococcus, Group A Screen (Direct) NEGATIVE NEGATIVE  Wet prep, genital  Result Value Ref Range   Yeast Wet Prep HPF POC NONE SEEN NONE SEEN   Trich, Wet Prep NONE SEEN NONE SEEN   Clue Cells Wet Prep HPF POC FEW (A) NONE SEEN   WBC, Wet  Prep HPF POC NONE SEEN NONE SEEN  CBC with Differential/Platelet  Result Value Ref Range   WBC 10.8 (H) 4.0 - 10.5 K/uL   RBC 4.64 3.87 - 5.11 MIL/uL   Hemoglobin 11.7 (L) 12.0 - 15.0 g/dL   HCT 16.135.5 (L) 09.636.0 - 04.546.0 %   MCV 76.5 (L) 78.0 - 100.0 fL   MCH 25.2 (L) 26.0 - 34.0 pg   MCHC 33.0 30.0 - 36.0 g/dL   RDW 40.914.2 81.111.5 - 91.415.5 %   Platelets 363 150 - 400 K/uL   Neutrophils Relative % 93 (H) 43 - 77 %   Neutro Abs 10.0 (H) 1.7 - 7.7 K/uL   Lymphocytes Relative 5 (L) 12 - 46 %   Lymphs Abs 0.6 (L) 0.7 - 4.0 K/uL   Monocytes Relative 2 (L) 3 - 12 %   Monocytes Absolute 0.2 0.1 - 1.0 K/uL   Eosinophils Relative 0 0 - 5 %   Eosinophils Absolute 0.0 0.0 - 0.7 K/uL   Basophils Relative 0 0 - 1 %   Basophils Absolute 0.0 0.0 - 0.1 K/uL  Comprehensive metabolic panel  Result Value Ref Range   Sodium 139 135 - 145 mmol/L   Potassium 3.1 (L) 3.5 - 5.1 mmol/L   Chloride 107 101 - 111 mmol/L   CO2 23 22 - 32 mmol/L   Glucose, Bld 122 (H) 65 - 99 mg/dL   BUN 10 6 - 20 mg/dL   Creatinine, Ser 7.821.01 (H) 0.44 - 1.00 mg/dL   Calcium 8.3 (L) 8.9 - 10.3 mg/dL   Total Protein 7.7 6.5 - 8.1 g/dL   Albumin 3.8 3.5 - 5.0 g/dL   AST 25 15 - 41 U/L   ALT 16 14 - 54 U/L   Alkaline Phosphatase 64 38 - 126 U/L   Total Bilirubin 0.9 0.3 - 1.2 mg/dL   GFR calc non Af Amer >60 >60 mL/min   GFR calc Af Amer >60 >60 mL/min   Anion gap 9 5 - 15  Lipase, blood  Result Value Ref Range   Lipase 22 22 - 51 U/L  Urinalysis, Routine w reflex microscopic (not at [facility])  Result Value Ref Range   Color, Urine YELLOW YELLOW   APPearance CLEAR CLEAR   Specific Gravity, Urine 1.024 1.005 - 1.030   pH 5.5 5.0 - 8.0   Glucose, UA NEGATIVE NEGATIVE mg/dL   Hgb urine dipstick NEGATIVE NEGATIVE   Bilirubin Urine NEGATIVE NEGATIVE   Ketones, ur 15 (A) NEGATIVE mg/dL   Protein, ur NEGATIVE NEGATIVE mg/dL   Urobilinogen, UA 0.2 0.0 - 1.0 mg/dL   Nitrite  NEGATIVE NEGATIVE   Leukocytes, UA NEGATIVE NEGATIVE  POC  urine preg, ED (not at [facility])  Result Value Ref Range   Preg Test, Ur NEGATIVE NEGATIVE  I-stat troponin, ED  Result Value Ref Range   Troponin i, poc 0.00 0.00 - 0.08 ng/mL   Comment 3            Labs Review Labs Reviewed  WET PREP, GENITAL - Abnormal; Notable for the following:    Clue Cells Wet Prep HPF POC FEW (*)    All other components within normal limits  CBC WITH DIFFERENTIAL/PLATELET - Abnormal; Notable for the following:    WBC 10.8 (*)    Hemoglobin 11.7 (*)    HCT 35.5 (*)    MCV 76.5 (*)    MCH 25.2 (*)    Neutrophils Relative % 93 (*)    Neutro Abs 10.0 (*)    Lymphocytes Relative 5 (*)    Lymphs Abs 0.6 (*)    Monocytes Relative 2 (*)    All other components within normal limits  COMPREHENSIVE METABOLIC PANEL - Abnormal; Notable for the following:    Potassium 3.1 (*)    Glucose, Bld 122 (*)    Creatinine, Ser 1.01 (*)    Calcium 8.3 (*)    All other components within normal limits  URINALYSIS, ROUTINE W REFLEX MICROSCOPIC (NOT AT [facility]) - Abnormal; Notable for the following:    Ketones, ur 15 (*)    All other components within normal limits  RAPID STREP SCREEN (NOT AT [facility])  CULTURE, GROUP A STREP  LIPASE, BLOOD  POC URINE PREG, ED  I-STAT TROPOININ, ED  GC/CHLAMYDIA PROBE AMP ([place]) NOT AT [facility]    Imaging Review Dg Chest 2 View  11/24/2014   CLINICAL DATA:  Productive cough for 2 days. Wheezing, chest pain and shortness of breath.  EXAM: CHEST  2 VIEW  COMPARISON:  PA and lateral chest 06/01/2014 and 08/06/2013.  FINDINGS: Heart size and mediastinal contours are within normal limits. Both lungs are clear. Visualized skeletal structures are unremarkable.  IMPRESSION: Negative exam.   Electronically Signed   By: Drusilla Kanner M.D.   On: 11/24/2014 08:41   US Transvaginal Non-ob  11/24/2014   CLINICAL DATA:  Left pelvic pain since yesterday  EXAM: TRANSABDOMINAL AND TRANSVAGINAL ULTRASOUND OF PELVIS  DOPPLER ULTRASOUND OF OVARIES  TECHNIQUE: Both  transabdominal and transvaginal ultrasound examinations of the pelvis were performed. Transabdominal technique was performed for global imaging of the pelvis including uterus, ovaries, adnexal regions, and pelvic cul-de-sac.  It was necessary to proceed with endovaginal exam following the transabdominal exam to visualize the ovaries. Color and duplex Doppler ultrasound was utilized to evaluate blood flow to the ovaries.  COMPARISON:  None.  FINDINGS: Uterus  Measurements: 8.3 x 4.1 x 4.8 cm. No fibroids or other mass visualized.  Endometrium  Thickness: 7 mm.  No focal abnormality visualized.  Right ovary  Measurements: 4.8 x 2.5 x 2.6 cm. Normal appearance/no adnexal mass.  Left ovary  Measurements: 5.5 x 3.0 x 2.4 cm. Normal appearance/no adnexal mass.  Pulsed Doppler evaluation of both ovaries demonstrates normal low-resistance arterial and venous waveforms.  Other findings  Small amount of free fluid noted within the pelvis. There is an anechoic tubular structure in the left adnexa which may represent hydrosalpinx.  IMPRESSION: 1. No evidence for ovarian torsion. 2. Left hydrosalpinx, chronic.   Electronically Signed   By: Signa Kell M.D.   On: 11/24/2014 12:06  US Pelvis Complete  11/24/2014   CLINICAL DATA:  Left pelvic pain since yesterday  EXAM: TRANSABDOMINAL AND TRANSVAGINAL ULTRASOUND OF PELVIS  DOPPLER ULTRASOUND OF OVARIES  TECHNIQUE: Both transabdominal and transvaginal ultrasound examinations of the pelvis were performed. Transabdominal technique was performed for global imaging of the pelvis including uterus, ovaries, adnexal regions, and pelvic cul-de-sac.  It was necessary to proceed with endovaginal exam following the transabdominal exam to visualize the ovaries. Color and duplex Doppler ultrasound was utilized to evaluate blood flow to the ovaries.  COMPARISON:  None.  FINDINGS: Uterus  Measurements: 8.3 x 4.1 x 4.8 cm. No fibroids or other mass visualized.  Endometrium  Thickness: 7 mm.   No focal abnormality visualized.  Right ovary  Measurements: 4.8 x 2.5 x 2.6 cm. Normal appearance/no adnexal mass.  Left ovary  Measurements: 5.5 x 3.0 x 2.4 cm. Normal appearance/no adnexal mass.  Pulsed Doppler evaluation of both ovaries demonstrates normal low-resistance arterial and venous waveforms.  Other findings  Small amount of free fluid noted within the pelvis. There is an anechoic tubular structure in the left adnexa which may represent hydrosalpinx.  IMPRESSION: 1. No evidence for ovarian torsion. 2. Left hydrosalpinx, chronic.   Electronically Signed   By: Signa Kell M.D.   On: 11/24/2014 12:06   [place] Art/ven Flow Abd Pelv Doppler  11/24/2014   CLINICAL DATA:  Left pelvic pain since yesterday  EXAM: TRANSABDOMINAL AND TRANSVAGINAL ULTRASOUND OF PELVIS  DOPPLER ULTRASOUND OF OVARIES  TECHNIQUE: Both transabdominal and transvaginal ultrasound examinations of the pelvis were performed. Transabdominal technique was performed for global imaging of the pelvis including uterus, ovaries, adnexal regions, and pelvic cul-de-sac.  It was necessary to proceed with endovaginal exam following the transabdominal exam to visualize the ovaries. Color and duplex Doppler ultrasound was utilized to evaluate blood flow to the ovaries.  COMPARISON:  None.  FINDINGS: Uterus  Measurements: 8.3 x 4.1 x 4.8 cm. No fibroids or other mass visualized.  Endometrium  Thickness: 7 mm.  No focal abnormality visualized.  Right ovary  Measurements: 4.8 x 2.5 x 2.6 cm. Normal appearance/no adnexal mass.  Left ovary  Measurements: 5.5 x 3.0 x 2.4 cm. Normal appearance/no adnexal mass.  Pulsed Doppler evaluation of both ovaries demonstrates normal low-resistance arterial and venous waveforms.  Other findings  Small amount of free fluid noted within the pelvis. There is an anechoic tubular structure in the left adnexa which may represent hydrosalpinx.  IMPRESSION: 1. No evidence for ovarian torsion. 2. Left hydrosalpinx, chronic.    Electronically Signed   By: Signa Kell M.D.   On: 11/24/2014 12:06     EKG Interpretation   Date/Time:  Monday Nov 24 2014 11:03:42 EDT Ventricular Rate:  99 PR Interval:  142 QRS Duration: 81 QT Interval:  343 QTC Calculation: 440 R Axis:   50 Text Interpretation:  Sinus rhythm Borderline T abnormalities, anterior  leads Since last tracing of earlier today No significant change was found  Confirmed by Effie Shy  MD, ELLIOTT [id number]) on 11/24/2014 11:53:11 AM      MDM   Final diagnoses:  LLQ pain  Asthma exacerbation  Bronchitis    Medications  albuterol (PROVENTIL HFA;VENTOLIN HFA) 108 (90 BASE) MCG/ACT inhaler 2 puff (not administered)  potassium chloride SA (K-DUR,KLOR-CON) CR tablet 40 mEq (not administered)  famotidine (PEPCID) IVPB 20 mg premix (0 mg Intravenous Stopped 11/24/14 0631)  ketorolac (TORADOL) 30 MG/ML injection 30 mg (30 mg Intravenous Given 11/24/14 0559)  gi cocktail (Maalox,Lidocaine,Donnatal) (  30 mLs Oral Given 11/24/14 0559)  sodium chloride 0.9 % bolus 1,000 mL (0 mLs Intravenous Stopped 11/24/14 0904)  ipratropium-albuterol (DUONEB) 0.5-2.5 (3) MG/3ML nebulizer solution 3 mL (3 mLs Nebulization Given 11/24/14 0658)  sodium chloride 0.9 % bolus 1,000 mL (0 mLs Intravenous Stopped 11/24/14 1212)    Filed Vitals:   11/24/14 1056 11/24/14 1100 11/24/14 1115 11/24/14 1245  BP: 128/76 124/77 135/73 123/81  Pulse: 94 92 93 86  Temp:      TempSrc:      Resp:  23 14 17   Height:      Weight:      SpO2: 99% 99% 100% 99%    EKG noted sinus rhythm with a heart rate of 99 bpm-he can change since last tracing. I-STAT troponin negative elevation. CBC noted mildly elevated white blood cell count of 10.8. Hemoglobin 11.7, hematocrit 35.5. CMP noted mildly low potassium of 3.1, current of 1.01 - when compared to 2 months ago patient's creatinine was 0.79. Lipase negative elevation. Rapid strep test negative. Urinalysis negative for hemoglobin, nitrites,  leukocytes-negative findings of infection. Urine pregnancy negative. Chest x-ray negative for acute abnormality. Wet prep negative yeast, negative Trichomonas few clue cells with no white blood cells noted. Ultrasound no evidence of ovarian torsion, left hydrosalpinx appears to be a chronic finding. Patient given IV fluids in ED setting with positive relief. Albuterol and DuoNeb inhalers given to patient with positive relief. Patient is asymptomatic, relief of shortness of breath and chest tightness. Patient ambulated well with pulse ox 100% on room air. Negative findings of tachycardia tachypnea. Negative findings of acute respiratory distress. Negative findings of UTI upon nephritis. Doubt PID. Left hydrosalpinx noted on ultrasound this appears to be a chronic finding. Chest x-ray negative for pneumonia. Negative findings leukocytosis. Abdominal exam unremarkable-doubt acute abnormalities. Agent stable, afebrile. Patient not septic appearing. Negative signs of respiratory distress. Patient tolerated fluids by mouth without difficulty. Discharged patient. Discharge patient with prednisone burst, azithromycin, albuterol. Referred patient to PCP and woman's outpatient clinic. Discussed with patient to rest and stay hydrated. Discussed with patient to have potassium and creatinine re-checked. Discussed with patient to avoid any physical strenuous activity. Discussed with patient to closely monitor symptoms and if symptoms are to worsen or change to report back to the ED - strict return instructions given.  Patient agreed to plan of care, understood, all questions answered.   Raymon Mutton, PA-C 11/24/14 1309  Mancel Bale, MD 11/24/14 [phone]

## 2014-11-24 NOTE — ED Notes (Signed)
Patient ambulatory to the bathroom with pulse ox of 100% and HR 113.  Primary RN made aware.

## 2014-11-24 NOTE — ED Provider Notes (Signed)
MSE was initiated and I personally evaluated the patient and placed orders (if any) at  5:39 AM on Nov 24, 2014.  24 year old female with a history of UTI, asthma, gastritis, and ovarian cyst presents to the emergency department for further evaluation of an asthma attack. Patient states that her asthma has been giving her problems since 1800 yesterday. Symptoms acutely worsened during the night. Patient states that she tried using her albuterol inhaler, but was out of this medication. Mother reports patient experiencing nausea, vomiting, and diarrhea since yesterday morning. Patient also complaining of nasal congestion. She is having diffuse chest pain with deep breathing. She is also complaining of a sore throat. No difficulty swallowing or drooling. No fever prior to arrival. Patient states that symptoms feel consistent with past asthma exacerbations. EMS gave 4 mg Zofran, 5 mg albuterol nebulizer, and 125 mg Solu-Medrol en route.  Given history of gastritis, Pepcid and GI cocktail ordered should this be causing exacerbation of patient's asthma symptoms. Toradol to be given for chest pain. Rapid strep also ordered given complaints of sore throat. Lungs are clear to auscultation bilaterally. Patient is experiencing no tachypnea. She appears mildly dyspneic which I appreciate to be anxiety related. No wheezing heard on auscultation of all lung fields. No rales or rhonchi.  The patient appears stable so that the remainder of the MSE may be completed by another provider.   Filed Vitals:   11/24/14 0456 11/24/14 0500  BP:  161/96  Pulse:  119  Temp:  98.1 F (36.7 C)  TempSrc:  Oral  Resp:  24  Height:  4\' 11"  (1.499 m)  Weight:  156 lb (70.761 kg)  SpO2: 95% 100%     Antony MaduraKelly Amaurie Wandel, PA-C 11/24/14 0542  Antony MaduraKelly Jasiri Hanawalt, PA-C 11/24/14 [phone]

## 2014-11-24 NOTE — ED Notes (Signed)
Pt arrived via EMS c/o asthma attack.  Mother states has had N/V/D since yesterday morning.  Pt used rescue inhaler at home.  EMS gave 4mg  Zofran, 5mg  Albuterol neb, 125 Solumedrol.  Mother at bedside.

## 2014-11-24 NOTE — ED Notes (Signed)
Repeat EKG done per provider request

## 2014-11-25 LAB — GC/CHLAMYDIA PROBE AMP (~~LOC~~) NOT AT ARMC
Chlamydia: NEGATIVE
Neisseria Gonorrhea: NEGATIVE

## 2014-11-26 LAB — CULTURE, GROUP A STREP: Strep A Culture: NEGATIVE

## 2015-02-03 ENCOUNTER — Encounter: Payer: Self-pay | Admitting: Internal Medicine

## 2015-02-03 NOTE — Telephone Encounter (Signed)
tammy to see above, to assist pt with next avail appt - ok with any provider

## 2015-02-10 ENCOUNTER — Ambulatory Visit: Payer: 59 | Admitting: Internal Medicine

## 2015-02-19 ENCOUNTER — Encounter: Payer: Self-pay | Admitting: Internal Medicine

## 2015-02-19 MED ORDER — ALBUTEROL SULFATE HFA 108 (90 BASE) MCG/ACT IN AERS: 2.0000 | INHALATION_SPRAY | Freq: Four times a day (QID) | RESPIRATORY_TRACT | Status: DC | PRN

## 2015-03-18 ENCOUNTER — Emergency Department (HOSPITAL_COMMUNITY)
Admission: EM | Admit: 2015-03-18 | Discharge: 2015-03-18 | Disposition: A | Discharge status: Home / Self Care | Payer: 59 | Source: Home / Self Care | Attending: Emergency Medicine | Admitting: Emergency Medicine

## 2015-03-18 ENCOUNTER — Emergency Department (HOSPITAL_COMMUNITY): Payer: 59

## 2015-03-18 ENCOUNTER — Inpatient Hospital Stay (HOSPITAL_COMMUNITY)
Admission: EM | Admit: 2015-03-18 | Discharge: 2015-03-20 | DRG: 202 | Disposition: A | Discharge status: Home / Self Care | Payer: 59 | Attending: Internal Medicine | Admitting: Internal Medicine

## 2015-03-18 ENCOUNTER — Encounter (HOSPITAL_COMMUNITY): Payer: Self-pay | Admitting: *Deleted

## 2015-03-18 DIAGNOSIS — N8320 Unspecified ovarian cysts: Secondary | ICD-10-CM | POA: Diagnosis present

## 2015-03-18 DIAGNOSIS — E872 Acidosis, unspecified: Secondary | ICD-10-CM | POA: Diagnosis present

## 2015-03-18 DIAGNOSIS — R7989 Other specified abnormal findings of blood chemistry: Secondary | ICD-10-CM | POA: Diagnosis present

## 2015-03-18 DIAGNOSIS — J351 Hypertrophy of tonsils: Secondary | ICD-10-CM

## 2015-03-18 DIAGNOSIS — J45901 Unspecified asthma with (acute) exacerbation: Secondary | ICD-10-CM | POA: Diagnosis not present

## 2015-03-18 DIAGNOSIS — Z803 Family history of malignant neoplasm of breast: Secondary | ICD-10-CM

## 2015-03-18 DIAGNOSIS — J029 Acute pharyngitis, unspecified: Secondary | ICD-10-CM | POA: Diagnosis present

## 2015-03-18 DIAGNOSIS — Z833 Family history of diabetes mellitus: Secondary | ICD-10-CM

## 2015-03-18 DIAGNOSIS — K2970 Gastritis, unspecified, without bleeding: Secondary | ICD-10-CM | POA: Diagnosis present

## 2015-03-18 DIAGNOSIS — D509 Iron deficiency anemia, unspecified: Secondary | ICD-10-CM | POA: Diagnosis present

## 2015-03-18 DIAGNOSIS — Z8249 Family history of ischemic heart disease and other diseases of the circulatory system: Secondary | ICD-10-CM

## 2015-03-18 DIAGNOSIS — Z87891 Personal history of nicotine dependence: Secondary | ICD-10-CM

## 2015-03-18 DIAGNOSIS — Z8261 Family history of arthritis: Secondary | ICD-10-CM

## 2015-03-18 DIAGNOSIS — N390 Urinary tract infection, site not specified: Secondary | ICD-10-CM | POA: Diagnosis present

## 2015-03-18 DIAGNOSIS — F1290 Cannabis use, unspecified, uncomplicated: Secondary | ICD-10-CM | POA: Diagnosis present

## 2015-03-18 LAB — MONONUCLEOSIS SCREEN: Mono Screen: NEGATIVE

## 2015-03-18 LAB — RAPID STREP SCREEN (MED CTR MEBANE ONLY): Streptococcus, Group A Screen (Direct): NEGATIVE

## 2015-03-18 MED ORDER — METHYLPREDNISOLONE SODIUM SUCC 125 MG IJ SOLR
125.0000 mg | Freq: Once | INTRAMUSCULAR | Status: AC
  Administered 2015-03-18: 125 mg via INTRAVENOUS
  Filled 2015-03-18: qty 2

## 2015-03-18 MED ORDER — LEVALBUTEROL HCL 1.25 MG/0.5ML IN NEBU
1.2500 mg | INHALATION_SOLUTION | Freq: Once | RESPIRATORY_TRACT | Status: AC
  Administered 2015-03-19: 1.25 mg via RESPIRATORY_TRACT
  Filled 2015-03-18: qty 0.5

## 2015-03-18 MED ORDER — IPRATROPIUM BROMIDE 0.02 % IN SOLN
RESPIRATORY_TRACT | Status: AC
  Administered 2015-03-18: 0.5 mg
  Filled 2015-03-18: qty 2.5

## 2015-03-18 MED ORDER — ALBUTEROL (5 MG/ML) CONTINUOUS INHALATION SOLN: 10.0000 mg/h | INHALATION_SOLUTION | RESPIRATORY_TRACT | Status: DC

## 2015-03-18 MED ORDER — MAGNESIUM SULFATE 2 GM/50ML IV SOLN
2.0000 g | INTRAVENOUS | Status: AC
  Administered 2015-03-19: 2 g via INTRAVENOUS
  Filled 2015-03-18: qty 50

## 2015-03-18 MED ORDER — DEXAMETHASONE SODIUM PHOSPHATE 10 MG/ML IJ SOLN
10.0000 mg | Freq: Once | INTRAMUSCULAR | Status: AC
  Administered 2015-03-18: 10 mg via INTRAMUSCULAR
  Filled 2015-03-18: qty 1

## 2015-03-18 NOTE — Discharge Instructions (Signed)
All the ER results are normal. Take over the counter medicine for pain relief and cough. See your doctor in 1 week if the symptoms continue or get worse.   Pharyngitis Pharyngitis is redness, pain, and swelling (inflammation) of your pharynx.  CAUSES  Pharyngitis is usually caused by infection. Most of the time, these infections are from viruses (viral) and are part of a cold. However, sometimes pharyngitis is caused by bacteria (bacterial). Pharyngitis can also be caused by allergies. Viral pharyngitis may be spread from person to person by coughing, sneezing, and personal items or utensils (cups, forks, spoons, toothbrushes). Bacterial pharyngitis may be spread from person to person by more intimate contact, such as kissing.  SIGNS AND SYMPTOMS  Symptoms of pharyngitis include:   Sore throat.   Tiredness (fatigue).   Low-grade fever.   Headache.  Joint pain and muscle aches.  Skin rashes.  Swollen lymph nodes.  Plaque-like film on throat or tonsils (often seen with bacterial pharyngitis). DIAGNOSIS  Your health care provider will ask you questions about your illness and your symptoms. Your medical history, along with a physical exam, is often all that is needed to diagnose pharyngitis. Sometimes, a rapid strep test is done. Other lab tests may also be done, depending on the suspected cause.  TREATMENT  Viral pharyngitis will usually get better in 3-4 days without the use of medicine. Bacterial pharyngitis is treated with medicines that kill germs (antibiotics).  HOME CARE INSTRUCTIONS   Drink enough water and fluids to keep your urine clear or pale yellow.   Only take over-the-counter or prescription medicines as directed by your health care provider:   If you are prescribed antibiotics, make sure you finish them even if you start to feel better.   Do not take aspirin.   Get lots of rest.   Gargle with 8 oz of salt water ( tsp of salt per 1 qt of water) as often  as every 1-2 hours to soothe your throat.   Throat lozenges (if you are not at risk for choking) or sprays may be used to soothe your throat. SEEK MEDICAL CARE IF:   You have large, tender lumps in your neck.  You have a rash.  You cough up green, yellow-brown, or bloody spit. SEEK IMMEDIATE MEDICAL CARE IF:   Your neck becomes stiff.  You drool or are unable to swallow liquids.  You vomit or are unable to keep medicines or liquids down.  You have severe pain that does not go away with the use of recommended medicines.  You have trouble breathing (not caused by a stuffy nose). MAKE SURE YOU:   Understand these instructions.  Will watch your condition.  Will get help right away if you are not doing well or get worse. Document Released: 06/13/2005 Document Revised: 04/03/2013 Document Reviewed: 02/18/2013 [facility] Patient Information 2015 [place], [place]. This information is not intended to replace advice given to you by your health care provider. Make sure you discuss any questions you have with your health care provider.

## 2015-03-18 NOTE — ED Notes (Signed)
Pt c/o asthma exacerbation worsening today. Was seen at [facility] this morning for same, given decadron, and discharged. Pt called EMS x 2 before agreeing to transport. EMS gave  Albuterol, 0.5mg  Atrovent enroute. Pt ambulatory to bed from EMS stretcher, able to speak in short full sentences. Tachycardic on cardiac monitor

## 2015-03-18 NOTE — ED Notes (Signed)
Dr. Gentry at bedside. 

## 2015-03-18 NOTE — ED Provider Notes (Signed)
CSN: [id number]     Arrival date & time 03/18/15  1946 History   First MD Initiated Contact with Patient 03/18/15 1952     Chief Complaint  Patient presents with  . Asthma   Andrea Daniels is a 24 y.o. female with a history of asthma who presents to the ED complaining of worsening shortness of breath, cough, wheezing, sore throat and post nasal drip. Patient was seen earlier today in the ED at [facility] for sore throat and cough. She had an unremarkable CXR and negative rapid strep and mono screen. She received 10 mg of Decadron and was discharged. Patient reports that over the past 4 days she's had increasing cough and wheezing and increasing need to use her albuterol MDI. She reports she has used her albuterol inhaler 4 times prior to calling EMS today. She reports that she usually uses her albuterol inhaler twice per day.  She reports a productive cough without hemoptysis. She complains of chest tightness without chest pain. EMS provided the patient with 0.5 mg of Atrovent and a 10 mg albuterol continuous nebulization treatment. The patient denies history of intubation for asthma exacerbation. The patient denies fevers, chills, chest pain, hemoptysis, no loss of consciousness, abdominal pain, nausea, vomiting, leg pain, leg swelling or rashes.  (Consider location/radiation/quality/duration/timing/severity/associated sxs/prior Treatment) HPI  Past Medical History  Diagnosis Date  . UTI (lower urinary tract infection)   . Asthma   . Gastritis   . Ovarian cyst   . Gastritis    Past Surgical History  Procedure Laterality Date  . No past surgeries     Family History  Problem Relation Age of Onset  . Hyperlipidemia Other   . Hypertension Other   . Arthritis Other   . Cancer Other     breast cancer  . Hypertension Other   . Diabetes Other    Social History  Substance Use Topics  . Smoking status: Former Smoker -- 0.10 packs/day    Types: Cigarettes  . Smokeless tobacco: Never Used  .  Alcohol Use: Yes     Comment: 1-5 cans a week   OB History    Gravida Para Term Preterm AB TAB SAB Ectopic Multiple Living   0              Review of Systems  Constitutional: Negative for fever and chills.  HENT: Negative for congestion and sore throat.   Eyes: Negative for visual disturbance.  Respiratory: Positive for cough, chest tightness, shortness of breath and wheezing.   Cardiovascular: Negative for chest pain, palpitations and leg swelling.  Gastrointestinal: Negative for nausea, vomiting, abdominal pain and diarrhea.  Genitourinary: Negative for dysuria and difficulty urinating.  Musculoskeletal: Negative for back pain and neck pain.  Skin: Negative for rash.  Neurological: Negative for syncope, weakness, light-headedness and headaches.      Allergies  Review of patient's allergies indicates no known allergies.  Home Medications   Prior to Admission medications   Medication Sig Start Date End Date Taking? Authorizing Provider  albuterol (PROVENTIL HFA;VENTOLIN HFA) 108 (90 BASE) MCG/ACT inhaler Inhale 2 puffs into the lungs every 6 (six) hours as needed for wheezing. 02/19/15   Corwin Levins, MD  azithromycin (ZITHROMAX) 250 MG tablet Take 1 tablet (250 mg total) by mouth daily. Take first 2 tablets together, then 1 every day until finished. Patient not taking: Reported on 03/18/2015 11/24/14   Marissa Sciacca, PA-C  budesonide-formoterol (SYMBICORT) 160-4.5 MCG/ACT inhaler Inhale 2 puffs into the lungs  2 (two) times daily. Patient not taking: Reported on 09/09/2014 08/06/14   Corwin Levins, MD  DM-Doxylamine-Acetaminophen (NYQUIL COLD & FLU PO) Take 30 mLs by mouth at bedtime as needed (cold/flu).    Historical Provider, MD  mometasone-formoterol (DULERA) 200-5 MCG/ACT AERO Inhale 2 puffs into the lungs 2 (two) times daily. Patient not taking: Reported on 09/09/2014 08/14/14   Corwin Levins, MD  naproxen (NAPROSYN) 500 MG tablet Take 1 tablet (500 mg total) by mouth 2 (two)  times daily. Patient not taking: Reported on 11/24/2014 09/09/14   Joycie Peek, PA-C  predniSONE (DELTASONE) 20 MG tablet 3 tabs po day one, then 2 tabs daily x 4 days Patient not taking: Reported on 03/18/2015 11/24/14   Marissa Sciacca, PA-C   BP 144/92 mmHg  Pulse 158  Resp 24  SpO2 99%  LMP 02/26/2015 (Exact Date) Physical Exam  Constitutional: She is oriented to person, place, and time. She appears well-developed and well-nourished. No distress.  Nontoxic appearing.  HENT:  Head: Normocephalic and atraumatic.  Mouth/Throat: Oropharynx is clear and moist. No oropharyngeal exudate.  Mild bilateral tonsillar hypertrophy without exudates. Uvula is midline without edema. Tongue protrusion is normal. Secretions without difficulty.  Eyes: Conjunctivae are normal. Pupils are equal, round, and reactive to light. Right eye exhibits no discharge. Left eye exhibits no discharge.  Neck: Normal range of motion. Neck supple. No JVD present. No tracheal deviation present.  Cardiovascular: Regular rhythm, normal heart sounds and intact distal pulses.  Exam reveals no gallop and no friction rub.   No murmur heard. Tachycardic with a heart rate of 142.  Pulmonary/Chest: Effort normal. No respiratory distress. She has wheezes. She has no rales. She exhibits no tenderness.  Diminished lung sounds bilaterally with scattered wheezes. No rhonchi. Speaking in complete sentences on neb treatment.   Abdominal: Soft. She exhibits no distension. There is no tenderness. There is no guarding.  Musculoskeletal: She exhibits no edema or tenderness.  No lower extremity edema or tenderness.  Lymphadenopathy:    She has no cervical adenopathy.  Neurological: She is alert and oriented to person, place, and time. Coordination normal.  Skin: Skin is warm and dry. No rash noted. She is not diaphoretic. No erythema. No pallor.  Psychiatric: She has a normal mood and affect. Her behavior is normal.  Nursing note and  vitals reviewed.   ED Course  Procedures (including critical care time) Labs Review Labs Reviewed - No data to display  Imaging Review Dg Chest 2 View  03/18/2015   CLINICAL DATA:  Nonproductive cough for 2 weeks with sore throat. Denies pain.  EXAM: CHEST  2 VIEW  COMPARISON:  Chest x-ray dated 11/24/2014.  FINDINGS: The heart size and mediastinal contours are within normal limits. Both lungs are clear. The visualized skeletal structures are unremarkable.  IMPRESSION: Normal chest x-ray.  Lungs are clear.   Electronically Signed   By: Bary Richard M.D.   On: 03/18/2015 09:28   I have personally reviewed and evaluated these images as part of my medical decision-making.   EKG Interpretation None      Filed Vitals:   03/18/15 2300 03/18/15 2330 03/19/15 0000 03/19/15 0013  BP: 144/82 132/78 139/76   Pulse: 129 135 122   Temp:      TempSrc:      Resp: SpO2: 95% 93% 96% 94%     MDM   Meds given in ED:  Medications  albuterol (PROVENTIL,VENTOLIN) solution continuous  neb (10 mg/hr Nebulization Not Given 03/18/15 2220)  magnesium sulfate IVPB 2 g 50 mL (0 g Intravenous Paused 03/19/15 0038)  methylPREDNISolone sodium succinate (SOLU-MEDROL) 125 mg/2 mL injection 125 mg (125 mg Intravenous Given 03/18/15 2034)  ipratropium (ATROVENT) 0.02 % nebulizer solution (0.5 mg  Given 03/18/15 2227)  levalbuterol (XOPENEX) nebulizer solution 1.25 mg (1.25 mg Nebulization Given 03/19/15 0012)    New Prescriptions   No medications on file    Final diagnoses:  Asthma exacerbation    This is a 24 y.o. female with a history of asthma who presents to the ED complaining of worsening shortness of breath, cough, wheezing, sore throat and post nasal drip. Patient was seen earlier today in the ED at [facility] for sore throat and cough. She had an unremarkable CXR and negative rapid strep and mono screen. She received 10 mg of Decadron and was discharged. Patient reports that over the past 4 days  she's had increasing cough and wheezing and increasing need to use her albuterol MDI. She reports she has used her albuterol inhaler 4 times prior to calling EMS today. She reports that she usually uses her albuterol inhaler twice per day.  She reports a productive cough without hemoptysis. She complains of chest tightness without chest pain. EMS provided the patient with 0.5 mg of Atrovent and a 10 mg albuterol continuous nebulization treatment PTA.  On exam the patient is afebrile and nontoxic appearing. The patient is tachypneic at a rate of 26 on continuous albuterol treatment. With oxygen saturation of 100% on breathing treatment.  She is able to speak in complete sentences through the mask. She is tachycardic in the 130s. Lung sounds are diminished bilaterally with scattered wheezes. Chest x-ray from earlier today is unremarkable. Will give solumedrol during continuous neb.  At repeat evaluation the patient is still feeling short of breath, but improved after continuous nebulization treatment. Patient given Xopenex and will reevaluate. After Xopenex patient is still tachycardic in the Neck with oxygen saturation of 93% on 3 L via nasal cannula. Will provide 1 more round of Xopenex and consult for admission.  The patient was accepted for admission by Dr. Beatrice Lecher who requested step down orders. The patient is in agreement with admission and reports feeling much better at time of admission.   This patient was discussed with and evaluated by Dr. Littie Deeds who agrees with assessment and plan.    Everlene Farrier, PA-C 03/19/15 1610  Mirian Mo, MD 03/20/15 [phone]

## 2015-03-18 NOTE — Progress Notes (Signed)
MD aware of pt HR continuous neb contraindicated. Atrovent was given per MD verbal order instead. Pt is stable at this time, no complications noted. Pt O2 saturation is acceptable at this time 97%RA.

## 2015-03-18 NOTE — ED Notes (Signed)
Pt reports non productive cough x2 weeks with sore throat. Denies pain. Has tried OTC meds with no relief. Denies coughing up blood. Denies fever.

## 2015-03-18 NOTE — ED Notes (Signed)
RT notified for neb treatment.

## 2015-03-18 NOTE — ED Notes (Signed)
Pt assisted to restroom via wheelchair. Pt sats 93% after returning to room; respiratory rate increased to 38-42 bpm, c/o tightness in chest.

## 2015-03-18 NOTE — ED Provider Notes (Signed)
CSN: [id number]     Arrival date & time 03/18/15  9629 History   First MD Initiated Contact with Patient 03/18/15 [phone]     Chief Complaint  Patient presents with  . Cough  . Sore Throat     (Consider location/radiation/quality/duration/timing/severity/associated sxs/prior Treatment) Patient is a 24 y.o. female presenting with cough and pharyngitis. The history is provided by the patient.  Cough Cough characteristics:  Productive Sputum characteristics:  Clear Severity:  Moderate Duration:  2 weeks Timing:  Constant Chronicity:  New Context: not occupational exposure and not weather changes   Ineffective treatments:  None tried Associated symptoms: sinus congestion and sore throat   Associated symptoms: no chest pain, no headaches and no shortness of breath   Sore Throat Pertinent negatives include no chest pain, no abdominal pain, no headaches and no shortness of breath.    Past Medical History  Diagnosis Date  . UTI (lower urinary tract infection)   . Asthma   . Gastritis   . Ovarian cyst   . Gastritis    Past Surgical History  Procedure Laterality Date  . No past surgeries     Family History  Problem Relation Age of Onset  . Hyperlipidemia Other   . Hypertension Other   . Arthritis Other   . Cancer Other     breast cancer  . Hypertension Other   . Diabetes Other    Social History  Substance Use Topics  . Smoking status: Former Smoker -- 0.10 packs/day    Types: Cigarettes  . Smokeless tobacco: Never Used  . Alcohol Use: Yes     Comment: 1-5 cans a week   OB History    Gravida Para Term Preterm AB TAB SAB Ectopic Multiple Living   0              Review of Systems  Constitutional: Negative for activity change.  HENT: Positive for sore throat.   Respiratory: Positive for cough. Negative for shortness of breath.   Cardiovascular: Negative for chest pain.  Gastrointestinal: Negative for nausea, vomiting and abdominal pain.  Genitourinary: Negative for  dysuria.  Musculoskeletal: Negative for neck pain.  Neurological: Negative for headaches.      Allergies  Review of patient's allergies indicates no known allergies.  Home Medications   Prior to Admission medications   Medication Sig Start Date End Date Taking? Authorizing Provider  albuterol (PROVENTIL HFA;VENTOLIN HFA) 108 (90 BASE) MCG/ACT inhaler Inhale 2 puffs into the lungs every 6 (six) hours as needed for wheezing. 02/19/15  Yes Corwin Levins, MD  DM-Doxylamine-Acetaminophen (NYQUIL COLD & FLU PO) Take 30 mLs by mouth at bedtime as needed (cold/flu).   Yes Historical Provider, MD  azithromycin (ZITHROMAX) 250 MG tablet Take 1 tablet (250 mg total) by mouth daily. Take first 2 tablets together, then 1 every day until finished. Patient not taking: Reported on 03/18/2015 11/24/14   Marissa Sciacca, PA-C  budesonide-formoterol (SYMBICORT) 160-4.5 MCG/ACT inhaler Inhale 2 puffs into the lungs 2 (two) times daily. Patient not taking: Reported on 09/09/2014 08/06/14   Corwin Levins, MD  mometasone-formoterol [facility]) 200-5 MCG/ACT AERO Inhale 2 puffs into the lungs 2 (two) times daily. Patient not taking: Reported on 09/09/2014 08/14/14   Corwin Levins, MD  naproxen (NAPROSYN) 500 MG tablet Take 1 tablet (500 mg total) by mouth 2 (two) times daily. Patient not taking: Reported on 11/24/2014 09/09/14   Joycie Peek, PA-C  predniSONE (DELTASONE) 20 MG tablet 3 tabs po day  one, then 2 tabs daily x 4 days Patient not taking: Reported on 03/18/2015 11/24/14   Marissa Sciacca, PA-C   BP 144/100 mmHg  Pulse 86  Temp(Src) 98.4 F (36.9 C) (Oral)  Resp 14  SpO2 92%  LMP 02/26/2015 (Exact Date) Physical Exam  Constitutional: She is oriented to person, place, and time. She appears well-developed and well-nourished.  HENT:  Head: Normocephalic and atraumatic.  Eyes: EOM are normal. Pupils are equal, round, and reactive to light.  Neck: Neck supple.  Tonsillar enlargement bilaterally, no exudates.   Cardiovascular: Normal rate, regular rhythm and normal heart sounds.   No murmur heard. Pulmonary/Chest: Effort normal. No respiratory distress.  Abdominal: Soft. She exhibits no distension. There is no tenderness. There is no rebound and no guarding.  Lymphadenopathy:    She has no cervical adenopathy.  Neurological: She is alert and oriented to person, place, and time.  Skin: Skin is warm and dry.  Nursing note and vitals reviewed.   ED Course  Procedures (including critical care time) Labs Review Labs Reviewed  RAPID STREP SCREEN (NOT AT [facility])  CULTURE, GROUP A STREP  MONONUCLEOSIS SCREEN    Imaging Review Dg Chest 2 View  03/18/2015   CLINICAL DATA:  Nonproductive cough for 2 weeks with sore throat. Denies pain.  EXAM: CHEST  2 VIEW  COMPARISON:  Chest x-ray dated 11/24/2014.  FINDINGS: The heart size and mediastinal contours are within normal limits. Both lungs are clear. The visualized skeletal structures are unremarkable.  IMPRESSION: Normal chest x-ray.  Lungs are clear.   Electronically Signed   By: Bary Richard M.D.   On: 03/18/2015 09:28   I have personally reviewed and evaluated these images and lab results as part of my medical decision-making.   EKG Interpretation None      MDM   Final diagnoses:  Tonsillar enlargement  Pharyngitis    Pt comes in with cc of sore throat x 2 weeks and some dib. + tonsillar enlargement. No exudates. Cough x 2 weeks. Lungs are clear. CXR is neg. Rapid strep and mono neg. Will d/c. Advised pcp f/u.     Derwood Kaplan, MD 03/18/15 1343

## 2015-03-19 ENCOUNTER — Encounter (HOSPITAL_COMMUNITY): Payer: Self-pay | Admitting: Internal Medicine

## 2015-03-19 DIAGNOSIS — F1290 Cannabis use, unspecified, uncomplicated: Secondary | ICD-10-CM | POA: Diagnosis present

## 2015-03-19 DIAGNOSIS — J45901 Unspecified asthma with (acute) exacerbation: Secondary | ICD-10-CM | POA: Insufficient documentation

## 2015-03-19 DIAGNOSIS — J029 Acute pharyngitis, unspecified: Secondary | ICD-10-CM | POA: Diagnosis present

## 2015-03-19 DIAGNOSIS — E872 Acidosis: Secondary | ICD-10-CM | POA: Diagnosis present

## 2015-03-19 DIAGNOSIS — J4551 Severe persistent asthma with (acute) exacerbation: Secondary | ICD-10-CM

## 2015-03-19 DIAGNOSIS — N8320 Unspecified ovarian cysts: Secondary | ICD-10-CM | POA: Diagnosis present

## 2015-03-19 DIAGNOSIS — D509 Iron deficiency anemia, unspecified: Secondary | ICD-10-CM | POA: Diagnosis present

## 2015-03-19 DIAGNOSIS — Z803 Family history of malignant neoplasm of breast: Secondary | ICD-10-CM | POA: Diagnosis not present

## 2015-03-19 DIAGNOSIS — N390 Urinary tract infection, site not specified: Secondary | ICD-10-CM | POA: Diagnosis present

## 2015-03-19 DIAGNOSIS — J351 Hypertrophy of tonsils: Secondary | ICD-10-CM | POA: Diagnosis present

## 2015-03-19 DIAGNOSIS — Z833 Family history of diabetes mellitus: Secondary | ICD-10-CM | POA: Diagnosis not present

## 2015-03-19 DIAGNOSIS — Z8261 Family history of arthritis: Secondary | ICD-10-CM | POA: Diagnosis not present

## 2015-03-19 DIAGNOSIS — K2970 Gastritis, unspecified, without bleeding: Secondary | ICD-10-CM | POA: Diagnosis present

## 2015-03-19 DIAGNOSIS — Z8249 Family history of ischemic heart disease and other diseases of the circulatory system: Secondary | ICD-10-CM | POA: Diagnosis not present

## 2015-03-19 DIAGNOSIS — R946 Abnormal results of thyroid function studies: Secondary | ICD-10-CM | POA: Diagnosis not present

## 2015-03-19 DIAGNOSIS — Z87891 Personal history of nicotine dependence: Secondary | ICD-10-CM | POA: Diagnosis not present

## 2015-03-19 DIAGNOSIS — J4531 Mild persistent asthma with (acute) exacerbation: Secondary | ICD-10-CM | POA: Diagnosis not present

## 2015-03-19 LAB — BASIC METABOLIC PANEL
Anion gap: 13 (ref 5–15)
BUN: 5 mg/dL — ABNORMAL LOW (ref 6–20)
CO2: 15 mmol/L — ABNORMAL LOW (ref 22–32)
Calcium: 9.4 mg/dL (ref 8.9–10.3)
Chloride: 113 mmol/L — ABNORMAL HIGH (ref 101–111)
Creatinine, Ser: 0.9 mg/dL (ref 0.44–1.00)
GFR calc Af Amer: >60 mL/min (ref >60)
GFR calc non Af Amer: >60 mL/min (ref >60)
Glucose, Bld: 167 mg/dL — ABNORMAL HIGH (ref 65–99)
Potassium: 3.8 mmol/L (ref 3.5–5.1)
Sodium: 141 mmol/L (ref 135–145)

## 2015-03-19 LAB — CBC
HCT: 32.9 % — ABNORMAL LOW (ref 36.0–46.0)
Hemoglobin: 10.7 g/dL — ABNORMAL LOW (ref 12.0–15.0)
MCH: 23.8 pg — ABNORMAL LOW (ref 26.0–34.0)
MCHC: 32.5 g/dL (ref 30.0–36.0)
MCV: 73.3 fL — ABNORMAL LOW (ref 78.0–100.0)
Platelets: 522 10*3/uL — ABNORMAL HIGH (ref 150–400)
RBC: 4.49 MIL/uL (ref 3.87–5.11)
RDW: 14.3 % (ref 11.5–15.5)
WBC: 8.3 10*3/uL (ref 4.0–10.5)

## 2015-03-19 LAB — CBC WITH DIFFERENTIAL/PLATELET
Basophils Absolute: 0 10*3/uL (ref 0.0–0.1)
Basophils Relative: 0 %
Eosinophils Absolute: 0 10*3/uL (ref 0.0–0.7)
Eosinophils Relative: 0 %
HCT: 34.9 % — ABNORMAL LOW (ref 36.0–46.0)
Hemoglobin: 11.1 g/dL — ABNORMAL LOW (ref 12.0–15.0)
Lymphocytes Relative: 5 %
Lymphs Abs: 0.4 10*3/uL — ABNORMAL LOW (ref 0.7–4.0)
MCH: 23.6 pg — ABNORMAL LOW (ref 26.0–34.0)
MCHC: 31.8 g/dL (ref 30.0–36.0)
MCV: 74.1 fL — ABNORMAL LOW (ref 78.0–100.0)
Monocytes Absolute: 0.1 10*3/uL (ref 0.1–1.0)
Monocytes Relative: 1 %
Neutro Abs: 8.1 10*3/uL — ABNORMAL HIGH (ref 1.7–7.7)
Neutrophils Relative %: 94 %
Platelets: 541 10*3/uL — ABNORMAL HIGH (ref 150–400)
RBC: 4.71 MIL/uL (ref 3.87–5.11)
RDW: 14.2 % (ref 11.5–15.5)
WBC: 8.6 10*3/uL (ref 4.0–10.5)

## 2015-03-19 LAB — COMPREHENSIVE METABOLIC PANEL
ALT: 11 U/L — ABNORMAL LOW (ref 14–54)
AST: 28 U/L (ref 15–41)
Albumin: 4 g/dL (ref 3.5–5.0)
Alkaline Phosphatase: 64 U/L (ref 38–126)
Anion gap: 12 (ref 5–15)
BUN: <5 mg/dL — ABNORMAL LOW (ref 6–20)
CO2: 16 mmol/L — ABNORMAL LOW (ref 22–32)
Calcium: 8.9 mg/dL (ref 8.9–10.3)
Chloride: 109 mmol/L (ref 101–111)
Creatinine, Ser: 0.79 mg/dL (ref 0.44–1.00)
GFR calc Af Amer: >60 mL/min (ref >60)
GFR calc non Af Amer: >60 mL/min (ref >60)
Glucose, Bld: 137 mg/dL — ABNORMAL HIGH (ref 65–99)
Potassium: 3.8 mmol/L (ref 3.5–5.1)
Sodium: 137 mmol/L (ref 135–145)
Total Bilirubin: 0.4 mg/dL (ref 0.3–1.2)
Total Protein: 7.9 g/dL (ref 6.5–8.1)

## 2015-03-19 LAB — MRSA PCR SCREENING: MRSA by PCR: NEGATIVE

## 2015-03-19 LAB — INFLUENZA PANEL BY PCR (TYPE A & B)
H1N1 flu by pcr: NOT DETECTED
Influenza A By PCR: NEGATIVE
Influenza B By PCR: NEGATIVE

## 2015-03-19 LAB — T4, FREE: Free T4: 0.71 ng/dL (ref 0.61–1.12)

## 2015-03-19 LAB — TSH: TSH: 0.292 u[IU]/mL — ABNORMAL LOW (ref 0.350–4.500)

## 2015-03-19 LAB — MAGNESIUM: Magnesium: 2.4 mg/dL (ref 1.7–2.4)

## 2015-03-19 LAB — PHOSPHORUS: Phosphorus: 1.4 mg/dL — ABNORMAL LOW (ref 2.5–4.6)

## 2015-03-19 MED ORDER — GUAIFENESIN-DM 100-10 MG/5ML PO SYRP
5.0000 mL | ORAL_SOLUTION | ORAL | Status: DC | PRN
Start: 2015-03-19 — End: 2015-03-20
  Administered 2015-03-19 (×2): 5 mL via ORAL
  Filled 2015-03-19 (×2): qty 5

## 2015-03-19 MED ORDER — INFLUENZA VAC SPLIT QUAD 0.5 ML IM SUSY
0.5000 mL | PREFILLED_SYRINGE | INTRAMUSCULAR | Status: AC
  Administered 2015-03-20: 0.5 mL via INTRAMUSCULAR
  Filled 2015-03-19: qty 0.5

## 2015-03-19 MED ORDER — LEVALBUTEROL HCL 1.25 MG/0.5ML IN NEBU
1.2500 mg | INHALATION_SOLUTION | RESPIRATORY_TRACT | Status: DC | PRN
  Filled 2015-03-19: qty 0.5

## 2015-03-19 MED ORDER — GUAIFENESIN ER 600 MG PO TB12
600.0000 mg | ORAL_TABLET | Freq: Two times a day (BID) | ORAL | Status: DC
  Administered 2015-03-19 – 2015-03-20 (×4): 600 mg via ORAL
  Filled 2015-03-19 (×4): qty 1

## 2015-03-19 MED ORDER — HYDROCODONE-ACETAMINOPHEN 5-325 MG PO TABS
1.0000 | ORAL_TABLET | ORAL | Status: DC | PRN
  Administered 2015-03-19: 1 via ORAL
  Filled 2015-03-19: qty 1

## 2015-03-19 MED ORDER — ONDANSETRON HCL 4 MG/2ML IJ SOLN: 4.0000 mg | Freq: Four times a day (QID) | INTRAMUSCULAR | Status: DC | PRN

## 2015-03-19 MED ORDER — SODIUM CHLORIDE 0.9 % IV SOLN
INTRAVENOUS | Status: AC
  Administered 2015-03-19: 02:00:00 via INTRAVENOUS

## 2015-03-19 MED ORDER — ENOXAPARIN SODIUM 40 MG/0.4ML ~~LOC~~ SOLN
40.0000 mg | SUBCUTANEOUS | Status: DC
  Administered 2015-03-19: 40 mg via SUBCUTANEOUS
  Filled 2015-03-19 (×2): qty 0.4

## 2015-03-19 MED ORDER — ONDANSETRON HCL 4 MG PO TABS: 4.0000 mg | ORAL_TABLET | Freq: Four times a day (QID) | ORAL | Status: DC | PRN

## 2015-03-19 MED ORDER — METHYLPREDNISOLONE SODIUM SUCC 125 MG IJ SOLR
60.0000 mg | Freq: Every day | INTRAMUSCULAR | Status: DC
  Administered 2015-03-19: 60 mg via INTRAVENOUS
  Filled 2015-03-19: qty 2

## 2015-03-19 MED ORDER — ACETAMINOPHEN 325 MG PO TABS: 650.0000 mg | ORAL_TABLET | Freq: Four times a day (QID) | ORAL | Status: DC | PRN

## 2015-03-19 MED ORDER — METHYLPREDNISOLONE SODIUM SUCC 125 MG IJ SOLR
60.0000 mg | Freq: Three times a day (TID) | INTRAMUSCULAR | Status: DC
  Administered 2015-03-19 – 2015-03-20 (×3): 60 mg via INTRAVENOUS
  Filled 2015-03-19 (×3): qty 2

## 2015-03-19 MED ORDER — MOMETASONE FURO-FORMOTEROL FUM 100-5 MCG/ACT IN AERO
2.0000 | INHALATION_SPRAY | Freq: Two times a day (BID) | RESPIRATORY_TRACT | Status: DC
  Administered 2015-03-19 – 2015-03-20 (×3): 2 via RESPIRATORY_TRACT
  Filled 2015-03-19 (×2): qty 8.8

## 2015-03-19 MED ORDER — ACETAMINOPHEN 650 MG RE SUPP: 650.0000 mg | Freq: Four times a day (QID) | RECTAL | Status: DC | PRN

## 2015-03-19 MED ORDER — POTASSIUM & SODIUM PHOSPHATES 280-160-250 MG PO PACK
1.0000 | PACK | Freq: Three times a day (TID) | ORAL | Status: DC
  Administered 2015-03-19 – 2015-03-20 (×5): 1 via ORAL
  Filled 2015-03-19 (×15): qty 1

## 2015-03-19 MED ORDER — LEVALBUTEROL HCL 0.63 MG/3ML IN NEBU
0.6300 mg | INHALATION_SOLUTION | Freq: Four times a day (QID) | RESPIRATORY_TRACT | Status: DC
  Administered 2015-03-19 (×3): 0.63 mg via RESPIRATORY_TRACT
  Filled 2015-03-19 (×7): qty 3

## 2015-03-19 MED ORDER — SODIUM CHLORIDE 0.9 % IJ SOLN
3.0000 mL | Freq: Two times a day (BID) | INTRAMUSCULAR | Status: DC
  Administered 2015-03-19: 10 mL via INTRAVENOUS
  Administered 2015-03-20: 3 mL via INTRAVENOUS

## 2015-03-19 MED ORDER — PNEUMOCOCCAL VAC POLYVALENT 25 MCG/0.5ML IJ INJ
0.5000 mL | INJECTION | INTRAMUSCULAR | Status: DC
  Filled 2015-03-19: qty 0.5

## 2015-03-19 NOTE — ED Notes (Signed)
Admitting MD at bedside.

## 2015-03-19 NOTE — Progress Notes (Signed)
[facility] PROGRESS NOTE  Andrea Daniels ZOX:[id number] DOB: 11-26-1990 DOA: 03/18/2015 PCP: Oliver Barre, MD  Brief narrative 24 year old female with history of asthma (on steroid as needed, never intubated, last hospitalization 18 months back for exacerbation symptoms), gastritis presented with acute asthma exacerbation with progressive symptoms for the past 2 weeks with cough and sore throat. She was seen at [facility] long ED the previous day was given one dose of steroid and checked for Strep and mono which were negative and discharged home. At home she had wheezing with cough which got worse and she called EMS. She received continues and Atrovent en route. Patient admitted to hospitalist service.   Assessment/Plan: Acute asthma exacerbation Patient admitted to stepdown. Symptoms improving this morning. Continue Xopenex nebs scheduled and when necessary. Will continue with IV Solu-Medrol 60 mg every 8 hours. Supportive care with antitussives and dulera inhaler. Patient reports being prescribed Dulera but could not afford it and she not tolerate Symbicort well. Patient reports quitting smoking for over a year and smokes marijuana occasionally. 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 She also reports having clients who smoke. -She reports that this is her third asthma attack this year. I have discussed about staying away from potential offending agents like her pet, chemicals and smoke). -Patient would benefit from outpatient pulmonary follow-up. -Flu PCR negative. -Supportive care with Tylenol and antitussives.  Low TSH Denies any hyperthyroid symptoms. Check free T3 and T4.  Hypophosphatemia Added supplements.  May transfer to medical floor if continues to improve.   Diet: Regular DVT prophylaxis: Subcutaneous Lovenox  Code Status: Full code Family Communication: Family at bedside Disposition  Plan: Home in 1-2 days depending upon improvement   Consultants:  None  Procedures:  None  Antibiotics:  None  HPI/Subjective: Patient seen and examined. Admission H&P reviewed. Reports breathing to be slightly better this morning.  Objective: Filed Vitals:   03/19/15 0811  BP: 147/89  Pulse: 112  Temp: 98.6 F (37 C)  Resp: 20    Intake/Output Summary (Last 24 hours) at 03/19/15 0953 Last data filed at 03/19/15 0900  Gross per 24 hour  Intake    985 ml  Output    300 ml  Net    685 ml   Filed Weights   03/19/15 0208  Weight: 66.7 kg (147 lb 0.8 oz)    Exam:   General:  Young female in no acute distress  HEENT: No pallor, moist oral mucosa, supple neck  Chest: Diminished breath sounds bilaterally with scattered rhonchi  Cardiovascular: S1 and S2 tachycardic, no murmurs rub or gallop  Abdomen: soft, NT, ND , BS+  Musculoskeletal: warm, no edema  CNS: Alert and oriented  Data Reviewed: Basic Metabolic Panel:  Recent Labs Lab 03/18/15 2353 03/19/15 0308  NA 141 137  K 3.8 3.8  CL 113* 109  CO2 15* 16*  GLUCOSE 167* 137*  BUN 5* <5*  CREATININE 0.90 0.79  CALCIUM 9.4 8.9  MG  --  2.4  PHOS  --  1.4*   Liver Function Tests:  Recent Labs Lab 03/19/15 0308  AST 28  ALT 11*  ALKPHOS 64  BILITOT 0.4  PROT 7.9  ALBUMIN 4.0   No results for input(s): LIPASE, AMYLASE in the last 168 hours. No results for input(s): AMMONIA in the last 168 hours. CBC:  Recent Labs Lab 03/18/15 2353 03/19/15 0308  WBC  8.6 8.3  NEUTROABS 8.1*  --   HGB 11.1* 10.7*  HCT 34.9* 32.9*  MCV 74.1* 73.3*  PLT 541* 522*   Cardiac Enzymes: No results for input(s): CKTOTAL, CKMB, CKMBINDEX, TROPONINI in the last 168 hours. BNP (last 3 results) No results for input(s): BNP in the last 8760 hours.  ProBNP (last 3 results) No results for input(s): PROBNP in the last 8760 hours.  CBG: No results for input(s): GLUCAP in the last 168 hours.  Recent  Results (from the past 240 hour(s))  Rapid strep screen     Status: None   Collection Time: 03/18/15 10:12 AM  Result Value Ref Range Status   Streptococcus, Group A Screen (Direct) NEGATIVE NEGATIVE Final    Comment: (NOTE) A Rapid Antigen test may result negative if the antigen level in the sample is below the detection level of this test. The FDA has not cleared this test as a stand-alone test therefore the rapid antigen negative result has reflexed to a Group A Strep culture.   MRSA PCR Screening     Status: None   Collection Time: 03/19/15  2:28 AM  Result Value Ref Range Status   MRSA by PCR NEGATIVE NEGATIVE Final    Comment:        The GeneXpert MRSA Assay (FDA approved for NASAL specimens only), is one component of a comprehensive MRSA colonization surveillance program. It is not intended to diagnose MRSA infection nor to guide or monitor treatment for MRSA infections.      Studies: Dg Chest 2 View  03/18/2015   CLINICAL DATA:  Nonproductive cough for 2 weeks with sore throat. Denies pain.  EXAM: CHEST  2 VIEW  COMPARISON:  Chest x-ray dated 11/24/2014.  FINDINGS: The heart size and mediastinal contours are within normal limits. Both lungs are clear. The visualized skeletal structures are unremarkable.  IMPRESSION: Normal chest x-ray.  Lungs are clear.   Electronically Signed   By: Bary Richard M.D.   On: 03/18/2015 09:28    Scheduled Meds: . enoxaparin (LOVENOX) injection  40 mg Subcutaneous Q24H  . guaiFENesin  600 mg Oral BID  . [START ON 03/20/2015] Influenza vac split quadrivalent PF  0.5 mL Intramuscular Tomorrow-1000  . levalbuterol  0.63 mg Nebulization Q6H  . methylPREDNISolone (SOLU-MEDROL) injection  60 mg Intravenous 3 times per day  . mometasone-formoterol  2 puff Inhalation BID  . [START ON 03/20/2015] pneumococcal 23 valent vaccine  0.5 mL Intramuscular Tomorrow-1000  . potassium & sodium phosphates  1 packet Oral TID WC & HS  . sodium chloride  3 mL  Intravenous Q12H   Continuous Infusions: . sodium chloride 75 mL/hr at 03/19/15 0700     Time spent: 25 minutes    Felix Meras  [facility] Pager [phone] If 7PM-7AM, please contact night-coverage at [email or web address], password [facility] 03/19/2015, 9:53 AM  LOS: 0 days

## 2015-03-19 NOTE — ED Notes (Signed)
Called to patient's room for c/o to upper R forearm, above IV site, swelling and tenderness noted to area. Contacted pharmacy for recommendations, per pharmacy no specific recommendations are listed for drug, ice applied to area

## 2015-03-19 NOTE — H&P (Signed)
PCP: Oliver Barre, MD    Referring provider Dansie   Chief Complaint:  Wheezing shortness of breath  HPI: Andrea Daniels is a 24 y.o. female   has a past medical history of UTI (lower urinary tract infection); Asthma; Gastritis; Ovarian cyst; and Gastritis.   Presented with cough X 2 weeks with associated sore throat.  Was seen at [facility] rapid strep and Mono was negative she was treated with Decadron and discharged to home. At home wheezing and cough got worse she called EMS and was started on continuous nebs, and given Atrovent. She continued to be tachycardic and tachypneic. Currently doing better. Patient states she tried to use Symbicort in the past but it made her lightheaded.  Denies any chest pain no travel hx. Has hx of prior asthma attacks and hospitalizations. Never been intubated. States this is the worse she ever felt.  Hospitalist was called for admission for asthma exacerbation  Review of Systems:    Pertinent positives include:  shortness of breath at rest.  dyspnea on exertion, wheezing.  Constitutional:  No weight loss, night sweats, Fevers, chills, fatigue, weight loss  HEENT:  No headaches, Difficulty swallowing,Tooth/dental problems,Sore throat,  No sneezing, itching, ear ache, nasal congestion, post nasal drip,  Cardio-vascular:  No chest pain, Orthopnea, PND, anasarca, dizziness, palpitations.no Bilateral lower extremity swelling  GI:  No heartburn, indigestion, abdominal pain, nausea, vomiting, diarrhea, change in bowel habits, loss of appetite, melena, blood in stool, hematemesis Resp:  no No No excess mucus, no productive cough, No non-productive cough, No coughing up of blood.No change in color of mucus.No  Skin:  no rash or lesions. No jaundice GU:  no dysuria, change in color of urine, no urgency or frequency. No straining to urinate.  No flank pain.  Musculoskeletal:  No joint pain or no joint swelling. No decreased range of motion. No back pain.    Psych:  No change in mood or affect. No depression or anxiety. No memory loss.  Neuro: no localizing neurological complaints, no tingling, no weakness, no double vision, no gait abnormality, no slurred speech, no confusion  Otherwise ROS are negative except for above, 10 systems were reviewed  Past Medical History: Past Medical History  Diagnosis Date  . UTI (lower urinary tract infection)   . Asthma   . Gastritis   . Ovarian cyst   . Gastritis    Past Surgical History  Procedure Laterality Date  . No past surgeries       Medications: Prior to Admission medications   Medication Sig Start Date End Date Taking? Authorizing Provider  albuterol (PROVENTIL HFA;VENTOLIN HFA) 108 (90 BASE) MCG/ACT inhaler Inhale 2 puffs into the lungs every 6 (six) hours as needed for wheezing. 02/19/15  Yes Corwin Levins, MD  DM-Doxylamine-Acetaminophen (NYQUIL COLD & FLU PO) Take 30 mLs by mouth at bedtime as needed (cold/flu).   Yes Historical Provider, MD  ibuprofen (ADVIL,MOTRIN) 200 MG tablet Take 400-600 mg by mouth every 6 (six) hours as needed for moderate pain.   Yes Historical Provider, MD  naproxen (NAPROSYN) 375 MG tablet Take 375 mg by mouth 2 (two) times daily as needed for moderate pain.   Yes Historical Provider, MD  azithromycin (ZITHROMAX) 250 MG tablet Take 1 tablet (250 mg total) by mouth daily. Take first 2 tablets together, then 1 every day until finished. Patient not taking: Reported on 03/18/2015 11/24/14   Marissa Sciacca, PA-C  budesonide-formoterol (SYMBICORT) 160-4.5 MCG/ACT inhaler Inhale 2 puffs into  the lungs 2 (two) times daily. Patient not taking: Reported on 09/09/2014 08/06/14   Corwin Levins, MD  mometasone-formoterol [facility]) 200-5 MCG/ACT AERO Inhale 2 puffs into the lungs 2 (two) times daily. Patient not taking: Reported on 09/09/2014 08/14/14   Corwin Levins, MD  naproxen (NAPROSYN) 500 MG tablet Take 1 tablet (500 mg total) by mouth 2 (two) times daily. Patient not  taking: Reported on 11/24/2014 09/09/14   Joycie Peek, PA-C  predniSONE (DELTASONE) 20 MG tablet 3 tabs po day one, then 2 tabs daily x 4 days Patient not taking: Reported on 03/18/2015 11/24/14   Marissa Sciacca, PA-C    Allergies:  No Known Allergies  Social History:  Ambulatory  independently   Lives at home alone,         reports that she has quit smoking. Her smoking use included Cigarettes. She smoked 0.10 packs per day. She has never used smokeless tobacco. She reports that she drinks alcohol. She reports that she does not use illicit drugs.    Family History: family history includes Arthritis in her other; Asthma in her other; Cancer in her other; Diabetes in her other; Hyperlipidemia in her other; Hypertension in her other and other.    Physical Exam: Patient Vitals for the past 24 hrs:  BP Temp Temp src Pulse Resp SpO2  03/18/15 2330 132/78 mmHg - - (!) 135 22 93 %  03/18/15 2300 144/82 mmHg - - (!) 129 (!) 28 95 %  03/18/15 2230 139/69 mmHg - - (!) 127 17 97 %  03/18/15 2229 - - - - - 95 %  03/18/15 2200 122/77 mmHg - - (!) 132 (!) 30 92 %  03/18/15 2130 125/66 mmHg - - (!) 132 26 94 %  03/18/15 2115 127/73 mmHg - - (!) 132 (!) 27 95 %  03/18/15 2045 141/76 mmHg - - (!) 141 (!) 28 95 %  03/18/15 2033 - 99.3 F (37.4 C) Oral (!) 149 (!) 32 96 %  03/18/15 1954 144/92 mmHg - - (!) 158 24 99 %  03/18/15 1948 - - - - - 92 %    1. General:  in No Acute distress 2. Psychological: Alert and  Oriented 3. Head/ENT:   Moist  Mucous Membranes                          Head Non traumatic, neck supple                          Normal   Dentition 4. SKIN: normal   Skin turgor,  Skin clean Dry and intact no rash 5. Heart: Regular rate and rhythm no Murmur, Rub or gallop 6. Lungs:  Occasional wheezes no crackles   7. Abdomen: Soft, non-tender, Non distended 8. Lower extremities: no clubbing, cyanosis, or edema 9. Neurologically Grossly intact, moving all 4 extremities  equally 10. MSK: Normal range of motion  body mass index is unknown because there is no weight on file.   Labs on Admission:   Results for orders placed or performed during the hospital encounter of 03/18/15 (from the past 24 hour(s))  Rapid strep screen     Status: None   Collection Time: 03/18/15 10:12 AM  Result Value Ref Range   Streptococcus, Group A Screen (Direct) NEGATIVE NEGATIVE  Mononucleosis screen     Status: None   Collection Time: 03/18/15 10:50 AM  Result Value  Ref Range   Mono Screen NEGATIVE NEGATIVE    UA not obtained  Lab Results  Component Value Date   HGBA1C 6.2 07/03/2014    CrCl cannot be calculated (Unknown ideal weight.).  BNP (last 3 results) No results for input(s): PROBNP in the last 8760 hours.  Other results:  I have pearsonaly reviewed this: ECG REPORT Not obtained   There were no vitals filed for this visit.   Cultures:    Component Value Date/Time   SDES URINE, CLEAN CATCH 11/18/2013 1253   SPECREQUEST NONE 11/18/2013 1253   CULT  11/18/2013 1253    ESCHERICHIA COLI Performed at [facility]   REPTSTATUS 11/20/2013 FINAL 11/18/2013 1253     Radiological Exams on Admission: Dg Chest 2 View  03/18/2015   CLINICAL DATA:  Nonproductive cough for 2 weeks with sore throat. Denies pain.  EXAM: CHEST  2 VIEW  COMPARISON:  Chest x-ray dated 11/24/2014.  FINDINGS: The heart size and mediastinal contours are within normal limits. Both lungs are clear. The visualized skeletal structures are unremarkable.  IMPRESSION: Normal chest x-ray.  Lungs are clear.   Electronically Signed   By: Bary Richard M.D.   On: 03/18/2015 09:28    Chart has been reviewed  Family not  at  Bedside Has Friend next to her.    Assessment/Plan  73 yo F with hx of persistent asthma needing daily rescue inhaler here with asthma exacerbation likely due to URI.    Present on Admission:  . Asthma with exacerbation will admit to stepdown given increased  work of breathing and oxygen requirement.  Will make sure patient is on IV steroids, xopenex PRN and Dulera, patient states she did not tolerate Symbicort in the past.   Would benefit from follow up with pulmonology as outpatient given poorly controled persistent asthma. Will test for influenza, droplet precautions    Prophylaxis:   Lovenox   CODE STATUS:  FULL CODE  as per patient   Disposition:  To home once workup is complete and patient is stable  Other plan as per orders.  I have spent a total of 55 min on this admission  Cecile Gillispie 03/19/2015, 12:03 AM  [facility]  Pager [phone]   after 2 AM please page floor coverage PA If 7AM-7PM, please contact the day team taking care of the patient  [email or web address]  Password [email or web address]

## 2015-03-19 NOTE — Care Management Note (Signed)
Case Management Note  Patient Details  Name: Andrea Daniels MRN: [id number] Date of Birth: [birth year]  Subjective/Objective:                 Asthma with exacerbation   Action/Plan: Return to home when medically stable. CM to f/u with d/c needs.  Expected Discharge Date:                  Expected Discharge Plan:  Home/Self Care  In-House Referral:     Discharge planning Services  CM Consult  Post Acute Care Choice:    Choice offered to:     DME Arranged:    DME Agency:     HH Arranged:    HH Agency:     Status of Service:  In process, will continue to follow  Medicare Important Message Given:    Date Medicare IM Given:    Medicare IM give by:    Date Additional Medicare IM Given:    Additional Medicare Important Message give by:     If discussed at Long Length of Stay Meetings, dates discussed:    Additional CommentsGodfrey Pick [facility])  [phone]  Gae Gallop [place], [place] [phone] 03/19/2015, 7:54 PM

## 2015-03-19 NOTE — ED Notes (Signed)
IV attempted x 1 without success; second RN to attempt 

## 2015-03-19 NOTE — Progress Notes (Signed)
Utilization review completed. Tywone Bembenek, RN, BSN. 

## 2015-03-20 DIAGNOSIS — D509 Iron deficiency anemia, unspecified: Secondary | ICD-10-CM | POA: Diagnosis present

## 2015-03-20 DIAGNOSIS — R7989 Other specified abnormal findings of blood chemistry: Secondary | ICD-10-CM | POA: Diagnosis present

## 2015-03-20 DIAGNOSIS — E872 Acidosis, unspecified: Secondary | ICD-10-CM | POA: Diagnosis present

## 2015-03-20 DIAGNOSIS — R946 Abnormal results of thyroid function studies: Secondary | ICD-10-CM

## 2015-03-20 DIAGNOSIS — J4531 Mild persistent asthma with (acute) exacerbation: Secondary | ICD-10-CM

## 2015-03-20 LAB — T3, FREE: T3, Free: 2.2 pg/mL (ref 2.0–4.4)

## 2015-03-20 MED ORDER — FLUTICASONE-SALMETEROL 250-50 MCG/DOSE IN AEPB: 1.0000 | INHALATION_SPRAY | Freq: Two times a day (BID) | RESPIRATORY_TRACT | Status: DC

## 2015-03-20 MED ORDER — LEVALBUTEROL HCL 0.63 MG/3ML IN NEBU
0.6300 mg | INHALATION_SOLUTION | Freq: Three times a day (TID) | RESPIRATORY_TRACT | Status: DC
  Administered 2015-03-20: 0.63 mg via RESPIRATORY_TRACT
  Filled 2015-03-20: qty 3

## 2015-03-20 MED ORDER — PREDNISONE 20 MG PO TABS
20.0000 mg | ORAL_TABLET | Freq: Every day | ORAL | Status: DC
Start: 2015-03-20 — End: 2015-05-12

## 2015-03-20 MED ORDER — GUAIFENESIN-DM 100-10 MG/5ML PO SYRP: 5.0000 mL | ORAL_SOLUTION | ORAL | Status: DC | PRN

## 2015-03-20 NOTE — Progress Notes (Signed)
Awaiting benefit check for advair and dulera inhalers.

## 2015-03-20 NOTE — Progress Notes (Signed)
      S/W ASHLEY @ [facility] # [phone]   ADVAIR INHALER- 30 DAY SUPPLY BID  COVER- YES CO=-PAY- $ 8.00  PRIOR APPROVAL- NO  PHARMACY - [facility]   DULERA INHALER * SMALLER INHALER *  COVER- YES  CO- PAY- $ 16.09  PRIOR APPROVAL NO  PHARMACY - [facility]

## 2015-03-20 NOTE — Discharge Instructions (Signed)

## 2015-03-20 NOTE — Care Management Note (Signed)
Case Management Note  Patient Details  Name: ELYZABETH GOATLEY MRN: [id number] Date of Birth: Mar 22, 1991  Subjective/Objective:     Date: 03/20/15 Spoke with patient at the bedside along with her friend. Introduced self as Sports coach and explained role in discharge planning and how to be reached. Verified patient lives in town, with friend, . Expressed no need for no other DME. Verified patient anticipates to go home with friend,  at time of discharge and will have  part-time supervision by  friend  at this time to best of their knowledge. Patient  denied needing help with their medication. Patient drives  to MD appointments. Verified patient has PCP Jonny Ruiz.  .   Plan: CM will continue to follow for discharge planning and [facility] resources.                Action/Plan:   Expected Discharge Date:                  Expected Discharge Plan:  Home/Self Care  In-House Referral:     Discharge planning Services  CM Consult  Post Acute Care Choice:    Choice offered to:     DME Arranged:    DME Agency:     HH Arranged:    HH Agency:     Status of Service:  Completed, signed off  Medicare Important Message Given:    Date Medicare IM Given:    Medicare IM give by:    Date Additional Medicare IM Given:    Additional Medicare Important Message give by:     If discussed at Long Length of Stay Meetings, dates discussed:    Additional Comments:  Leone Haven, RN 03/20/2015, 11:08 AM

## 2015-03-20 NOTE — Progress Notes (Signed)
Patient discharged to home with family. IV removed from left fore arm, discharge instructions and follow ups reviewed with patient and her girlfriend . Provided patient with written prescriptions. Pt. Stable at this time, no acute distress noted or acute complaints.

## 2015-03-20 NOTE — Discharge Summary (Signed)
Physician Discharge Summary  Andrea Daniels:[id number] DOB: [birth year] DOA: 03/18/2015  PCP: Oliver Barre, MD  Admit date: 03/18/2015 Discharge date: 03/20/2015  Time spent: 30 minutes  Recommendations for Outpatient Follow-up:  #1 Discharge home with outpatient PCP follow-up. Patient will be discharged on tapering dose of prednisone over the next 12 days #2 Please evaluate patient for her microcytic anemia as outpatient.  Discharge Diagnoses:  Principal Problem:   Asthma with acute exacerbation   Active Problems:   Hypophosphatemia   Low TSH level   Metabolic acidosis   Microcytic anemia   Discharge Condition: Fair  Diet recommendation: Regular  Filed Weights   03/19/15 0208 03/19/15 2245  Weight: 66.7 kg (147 lb 0.8 oz) 520.548 kg (1147 lb 9.6 oz)    History of present illness:  24 year old female with history of asthma (on steroid as needed, never intubated, last hospitalization 18 months back for exacerbation symptoms), gastritis presented with acute asthma exacerbation with progressive symptoms for the past 2 weeks with cough and sore throat. She was seen at [facility] long ED the previous day was given one dose of steroid and checked for Strep and mono which were negative and discharged home. At home she had wheezing with cough which got worse and she called EMS. She received continues and Atrovent en route. Patient admitted to hospitalist service.  Hospital Course:  Acute asthma exacerbation Patient admitted to stepdown. Placed on Xopenex nebs and Solu-Medrol.  transferred to medical floor later in the day as symptoms improved. Patient reports quitting smoking for over a year and smokes marijuana occasionally. 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 She also reports having clients who smoke. -She reports that this is her third asthma attack this year. I have discussed about staying away  from potential offending agents like her pet, chemicals and smoke). -If she has further asthma attacks symptoms she may benefit from outpatient pulmonary follow-up. -Flu PCR negative. -Symptoms have resolved this morning. I will discharge her on a short oral prednisone taper. I have also prescribed her Advair inhaler (confirmed that her co-pay for the month would be <$10). Also instructed to continue albuterol inhaler as needed. Counseled on quitting marijuana.  Low TSH Denies any hyperthyroid symptoms. Check free T3 and T4.  Hypophosphatemia Given supplements.  Microcytic anemia Possibly secondary to iron deficiency with heavy menstrual bleeding. Has mild thrombocytosis secondary to this as well. Follow-up with PCP as outpatient.    Code Status: Full code Family Communication: Family at bedside Disposition Plan: Home    Consultants:  None  Procedures:  None  Antibiotics:  None  Discharge Exam: Filed Vitals:   03/20/15 0621  BP: 117/62  Pulse: 57  Temp: 98.1 F (36.7 C)  Resp: 18    General: Middle aged female in no acute distress HEENT: No pallor, moist oral mucosa, supple neck Chest: Clear to auscultation bilaterally, no added sounds CVS: Normal S1 and S2, no murmurs rub or gallop GI: Soft, nondistended, nontender, bowel sounds present Musculoskeletal: Warm, no edema    Discharge Instructions    Current Discharge Medication List    START taking these medications   Details  Fluticasone-Salmeterol (ADVAIR DISKUS) 250-50 MCG/DOSE AEPB Inhale 1 puff into the lungs 2 (two) times daily. Qty: 5 each, Refills: 0    guaiFENesin-dextromethorphan (ROBITUSSIN DM) 100-10 MG/5ML syrup Take 5 mLs by mouth every 4 (four) hours as needed for cough.  Qty: 118 mL, Refills: 0      CONTINUE these medications which have CHANGED   Details  predniSONE (DELTASONE) 20 MG tablet Take 1 tablet (20 mg total) by mouth daily with breakfast. Qty: 16 tablet, Refills: 0       CONTINUE these medications which have NOT CHANGED   Details  albuterol (PROVENTIL HFA;VENTOLIN HFA) 108 (90 BASE) MCG/ACT inhaler Inhale 2 puffs into the lungs every 6 (six) hours as needed for wheezing. Qty: 1 Inhaler, Refills: 5      STOP taking these medications     DM-Doxylamine-Acetaminophen (NYQUIL COLD & FLU PO)      ibuprofen (ADVIL,MOTRIN) 200 MG tablet      naproxen (NAPROSYN) 375 MG tablet      azithromycin (ZITHROMAX) 250 MG tablet      budesonide-formoterol (SYMBICORT) 160-4.5 MCG/ACT inhaler      mometasone-formoterol (DULERA) 200-5 MCG/ACT AERO      naproxen (NAPROSYN) 500 MG tablet        No Known Allergies Follow-up Information    Follow up with Oliver Barre, MD In 2 weeks.   Specialties:  Internal Medicine, Radiology   Contact information:   [address] Maggie Schwalbe [facility] [place] [address] [phone]        The results of significant diagnostics from this hospitalization (including imaging, microbiology, ancillary and laboratory) are listed below for reference.    Significant Diagnostic Studies: Dg Chest 2 View  03/18/2015   CLINICAL DATA:  Nonproductive cough for 2 weeks with sore throat. Denies pain.  EXAM: CHEST  2 VIEW  COMPARISON:  Chest x-ray dated 11/24/2014.  FINDINGS: The heart size and mediastinal contours are within normal limits. Both lungs are clear. The visualized skeletal structures are unremarkable.  IMPRESSION: Normal chest x-ray.  Lungs are clear.   Electronically Signed   By: Bary Richard M.D.   On: 03/18/2015 09:28    Microbiology: Recent Results (from the past 240 hour(s))  Rapid strep screen     Status: None   Collection Time: 03/18/15 10:12 AM  Result Value Ref Range Status   Streptococcus, Group A Screen (Direct) NEGATIVE NEGATIVE Final    Comment: (NOTE) A Rapid Antigen test may result negative if the antigen level in the sample is below the detection level of this test. The FDA has not cleared this test as a stand-alone  test therefore the rapid antigen negative result has reflexed to a Group A Strep culture.   Culture, Group A Strep     Status: None   Collection Time: 03/18/15 10:12 AM  Result Value Ref Range Status   Strep A Culture Comment  Final    Comment: (NOTE) Microbiological testing to rule out the presence of possible pathogens is in progress. Performed At: [facility] [address] [place], [place] [id number] Mila Homer MD ZD:[id number]   MRSA PCR Screening     Status: None   Collection Time: 03/19/15  2:28 AM  Result Value Ref Range Status   MRSA by PCR NEGATIVE NEGATIVE Final    Comment:        The GeneXpert MRSA Assay (FDA approved for NASAL specimens only), is one component of a comprehensive MRSA colonization surveillance program. It is not intended to diagnose MRSA infection nor to guide or monitor treatment for MRSA infections.      Labs: Basic Metabolic Panel:  Recent Labs Lab 03/18/15 2353 03/19/15 0308  NA 141 137  K 3.8 3.8  CL 113* 109  CO2 15* 16*  GLUCOSE 167* 137*  BUN 5* <5*  CREATININE 0.90 0.79  CALCIUM 9.4 8.9  MG  --  2.4  PHOS  --  1.4*   Liver Function Tests:  Recent Labs Lab 03/19/15 0308  AST 28  ALT 11*  ALKPHOS 64  BILITOT 0.4  PROT 7.9  ALBUMIN 4.0   No results for input(s): LIPASE, AMYLASE in the last 168 hours. No results for input(s): AMMONIA in the last 168 hours. CBC:  Recent Labs Lab 03/18/15 2353 03/19/15 0308  WBC 8.6 8.3  NEUTROABS 8.1*  --   HGB 11.1* 10.7*  HCT 34.9* 32.9*  MCV 74.1* 73.3*  PLT 541* 522*   Cardiac Enzymes: No results for input(s): CKTOTAL, CKMB, CKMBINDEX, TROPONINI in the last 168 hours. BNP: BNP (last 3 results) No results for input(s): BNP in the last 8760 hours.  ProBNP (last 3 results) No results for input(s): PROBNP in the last 8760 hours.  CBG: No results for input(s): GLUCAP in the last 168 hours.     SignedEddie North  [facility] 03/20/2015, 11:32 AM

## 2015-03-21 LAB — CULTURE, GROUP A STREP

## 2015-03-26 ENCOUNTER — Ambulatory Visit: Payer: 59 | Admitting: Internal Medicine

## 2015-05-12 ENCOUNTER — Emergency Department (HOSPITAL_COMMUNITY): Payer: 59

## 2015-05-12 ENCOUNTER — Encounter (HOSPITAL_COMMUNITY): Payer: Self-pay | Admitting: Emergency Medicine

## 2015-05-12 ENCOUNTER — Emergency Department (HOSPITAL_COMMUNITY)
Admission: EM | Admit: 2015-05-12 | Discharge: 2015-05-12 | Disposition: A | Discharge status: Home / Self Care | Payer: 59 | Attending: Emergency Medicine | Admitting: Emergency Medicine

## 2015-05-12 DIAGNOSIS — Z7951 Long term (current) use of inhaled steroids: Secondary | ICD-10-CM | POA: Diagnosis not present

## 2015-05-12 DIAGNOSIS — Z87891 Personal history of nicotine dependence: Secondary | ICD-10-CM | POA: Insufficient documentation

## 2015-05-12 DIAGNOSIS — Z7952 Long term (current) use of systemic steroids: Secondary | ICD-10-CM | POA: Diagnosis not present

## 2015-05-12 DIAGNOSIS — Z79899 Other long term (current) drug therapy: Secondary | ICD-10-CM | POA: Insufficient documentation

## 2015-05-12 DIAGNOSIS — Z8719 Personal history of other diseases of the digestive system: Secondary | ICD-10-CM | POA: Diagnosis not present

## 2015-05-12 DIAGNOSIS — R0602 Shortness of breath: Secondary | ICD-10-CM | POA: Diagnosis present

## 2015-05-12 DIAGNOSIS — Z87440 Personal history of urinary (tract) infections: Secondary | ICD-10-CM | POA: Insufficient documentation

## 2015-05-12 DIAGNOSIS — Z8742 Personal history of other diseases of the female genital tract: Secondary | ICD-10-CM | POA: Insufficient documentation

## 2015-05-12 DIAGNOSIS — J45901 Unspecified asthma with (acute) exacerbation: Secondary | ICD-10-CM | POA: Insufficient documentation

## 2015-05-12 LAB — CBC WITH DIFFERENTIAL/PLATELET
Basophils Absolute: 0.1 10*3/uL (ref 0.0–0.1)
Basophils Relative: 2 %
Eosinophils Absolute: 0.7 10*3/uL (ref 0.0–0.7)
Eosinophils Relative: 13 %
HCT: 32.4 % — ABNORMAL LOW (ref 36.0–46.0)
Hemoglobin: 10.3 g/dL — ABNORMAL LOW (ref 12.0–15.0)
Lymphocytes Relative: 37 %
Lymphs Abs: 1.9 10*3/uL (ref 0.7–4.0)
MCH: 23.3 pg — ABNORMAL LOW (ref 26.0–34.0)
MCHC: 31.8 g/dL (ref 30.0–36.0)
MCV: 73.1 fL — ABNORMAL LOW (ref 78.0–100.0)
Monocytes Absolute: 0.3 10*3/uL (ref 0.1–1.0)
Monocytes Relative: 6 %
Neutro Abs: 2.1 10*3/uL (ref 1.7–7.7)
Neutrophils Relative %: 42 %
Platelets: 348 10*3/uL (ref 150–400)
RBC: 4.43 MIL/uL (ref 3.87–5.11)
RDW: 15.9 % — ABNORMAL HIGH (ref 11.5–15.5)
WBC: 5 10*3/uL (ref 4.0–10.5)

## 2015-05-12 LAB — BASIC METABOLIC PANEL
Anion gap: 6 (ref 5–15)
BUN: <5 mg/dL — ABNORMAL LOW (ref 6–20)
CO2: 24 mmol/L (ref 22–32)
Calcium: 8.4 mg/dL — ABNORMAL LOW (ref 8.9–10.3)
Chloride: 110 mmol/L (ref 101–111)
Creatinine, Ser: 0.87 mg/dL (ref 0.44–1.00)
GFR calc Af Amer: >60 mL/min (ref >60)
GFR calc non Af Amer: >60 mL/min (ref >60)
Glucose, Bld: 99 mg/dL (ref 65–99)
Potassium: 3.4 mmol/L — ABNORMAL LOW (ref 3.5–5.1)
Sodium: 140 mmol/L (ref 135–145)

## 2015-05-12 MED ORDER — MAGNESIUM SULFATE 2 GM/50ML IV SOLN
2.0000 g | Freq: Once | INTRAVENOUS | Status: AC
  Administered 2015-05-12: 2 g via INTRAVENOUS
  Filled 2015-05-12: qty 50

## 2015-05-12 MED ORDER — PREDNISONE 10 MG PO TABS: 20.0000 mg | ORAL_TABLET | Freq: Two times a day (BID) | ORAL | Status: DC

## 2015-05-12 MED ORDER — AZITHROMYCIN 250 MG PO TABS: ORAL_TABLET | ORAL | Status: DC

## 2015-05-12 MED ORDER — ALBUTEROL (5 MG/ML) CONTINUOUS INHALATION SOLN
10.0000 mg/h | INHALATION_SOLUTION | RESPIRATORY_TRACT | Status: DC
  Administered 2015-05-12: 10 mg/h via RESPIRATORY_TRACT
  Filled 2015-05-12: qty 20

## 2015-05-12 MED ORDER — METHYLPREDNISOLONE SODIUM SUCC 125 MG IJ SOLR
125.0000 mg | Freq: Once | INTRAMUSCULAR | Status: AC
  Administered 2015-05-12: 125 mg via INTRAVENOUS
  Filled 2015-05-12: qty 2

## 2015-05-12 NOTE — Discharge Instructions (Signed)
Prednisone as prescribed.  Zithromax as prescribed.  Continue your albuterol inhaler 2 puffs every 4 hours as needed for wheezing.  Return to the ER symptoms significantly worsen or change.   Asthma, Adult Asthma is a recurring condition in which the airways tighten and narrow. Asthma can make it difficult to breathe. It can cause coughing, wheezing, and shortness of breath. Asthma episodes, also called asthma attacks, range from minor to life-threatening. Asthma cannot be cured, but medicines and lifestyle changes can help control it. CAUSES Asthma is believed to be caused by inherited (genetic) and environmental factors, but its exact cause is unknown. Asthma may be triggered by allergens, lung infections, or irritants in the air. Asthma triggers are different for each person. Common triggers include:   Animal dander.  Dust mites.  Cockroaches.  Pollen from trees or grass.  Mold.  Smoke.  Air pollutants such as dust, household cleaners, hair sprays, aerosol sprays, paint fumes, strong chemicals, or strong odors.  Cold air, weather changes, and winds (which increase molds and pollens in the air).  Strong emotional expressions such as crying or laughing hard.  Stress.  Certain medicines (such as aspirin) or types of drugs (such as beta-blockers).  Sulfites in foods and drinks. Foods and drinks that may contain sulfites include dried fruit, potato chips, and sparkling grape juice.  Infections or inflammatory conditions such as the flu, a cold, or an inflammation of the nasal membranes (rhinitis).  Gastroesophageal reflux disease (GERD).  Exercise or strenuous activity. SYMPTOMS Symptoms may occur immediately after asthma is triggered or many hours later. Symptoms include:  Wheezing.  Excessive nighttime or early morning coughing.  Frequent or severe coughing with a common cold.  Chest tightness.  Shortness of breath. DIAGNOSIS  The diagnosis of asthma is made by  a review of your medical history and a physical exam. Tests may also be performed. These may include:  Lung function studies. These tests show how much air you breathe in and out.  Allergy tests.  Imaging tests such as X-rays. TREATMENT  Asthma cannot be cured, but it can usually be controlled. Treatment involves identifying and avoiding your asthma triggers. It also involves medicines. There are 2 classes of medicine used for asthma treatment:   Controller medicines. These prevent asthma symptoms from occurring. They are usually taken every day.  Reliever or rescue medicines. These quickly relieve asthma symptoms. They are used as needed and provide short-term relief. Your health care provider will help you create an asthma action plan. An asthma action plan is a written plan for managing and treating your asthma attacks. It includes a list of your asthma triggers and how they may be avoided. It also includes information on when medicines should be taken and when their dosage should be changed. An action plan may also involve the use of a device called a peak flow meter. A peak flow meter measures how well the lungs are working. It helps you monitor your condition. HOME CARE INSTRUCTIONS   Take medicines only as directed by your health care provider. Speak with your health care provider if you have questions about how or when to take the medicines.  Use a peak flow meter as directed by your health care provider. Record and keep track of readings.  Understand and use the action plan to help minimize or stop an asthma attack without needing to seek medical care.  Control your home environment in the following ways to help prevent asthma attacks:  Do not  smoke. Avoid being exposed to secondhand smoke.  Change your heating and air conditioning filter regularly.  Limit your use of fireplaces and wood stoves.  Get rid of pests (such as roaches and mice) and their droppings.  Throw away  plants if you see mold on them.  Clean your floors and dust regularly. Use unscented cleaning products.  Try to have someone else vacuum for you regularly. Stay out of rooms while they are being vacuumed and for a short while afterward. If you vacuum, use a dust mask from a hardware store, a double-layered or microfilter vacuum cleaner bag, or a vacuum cleaner with a HEPA filter.  Replace carpet with wood, tile, or vinyl flooring. Carpet can trap dander and dust.  Use allergy-proof pillows, mattress covers, and box spring covers.  Wash bed sheets and blankets every week in hot water and dry them in a dryer.  Use blankets that are made of polyester or cotton.  Clean bathrooms and kitchens with bleach. If possible, have someone repaint the walls in these rooms with mold-resistant paint. Keep out of the rooms that are being cleaned and painted.  Wash hands frequently. SEEK MEDICAL CARE IF:   You have wheezing, shortness of breath, or a cough even if taking medicine to prevent attacks.  The colored mucus you cough up (sputum) is thicker than usual.  Your sputum changes from clear or white to yellow, green, gray, or bloody.  You have any problems that may be related to the medicines you are taking (such as a rash, itching, swelling, or trouble breathing).  You are using a reliever medicine more than 2-3 times per week.  Your peak flow is still at 50-79% of your personal best after following your action plan for 1 hour.  You have a fever. SEEK IMMEDIATE MEDICAL CARE IF:   You seem to be getting worse and are unresponsive to treatment during an asthma attack.  You are short of breath even at rest.  You get short of breath when doing very little physical activity.  You have difficulty eating, drinking, or talking due to asthma symptoms.  You develop chest pain.  You develop a fast heartbeat.  You have a bluish color to your lips or fingernails.  You are light-headed, dizzy, or  faint.  Your peak flow is less than 50% of your personal best.   This information is not intended to replace advice given to you by your health care provider. Make sure you discuss any questions you have with your health care provider.   Document Released: 06/13/2005 Document Revised: 03/04/2015 Document Reviewed: 01/10/2013 Elsevier Interactive Patient Education [employer]

## 2015-05-12 NOTE — ED Provider Notes (Signed)
CSN: [id number]     Arrival date & time 05/12/15  4098 History   First MD Initiated Contact with Patient 05/12/15 0933     No chief complaint on file.    (Consider location/radiation/quality/duration/timing/severity/associated sxs/prior Treatment) HPI Comments: Patient is a 24 year old female with history of asthma. She presents for evaluation of wheezing and difficulty breathing. This started 2 days ago and has rapidly worsened. She denies any fevers or chills. She denies any chest pain or productive cough. He is tried her inhaler at home however this is not helping.  Patient is a 24 y.o. female presenting with shortness of breath. The history is provided by the patient.  Shortness of Breath Severity:  Moderate Onset quality:  Gradual Duration:  2 days Timing:  Constant Progression:  Worsening Chronicity:  Recurrent Context: activity   Relieved by:  Nothing Worsened by:  Nothing tried Ineffective treatments:  Inhaler Associated symptoms: no chest pain     Past Medical History  Diagnosis Date  . UTI (lower urinary tract infection)   . Asthma   . Gastritis   . Ovarian cyst   . Gastritis    Past Surgical History  Procedure Laterality Date  . No past surgeries     Family History  Problem Relation Age of Onset  . Hyperlipidemia Other   . Hypertension Other   . Arthritis Other   . Cancer Other     breast cancer  . Hypertension Other   . Asthma Other   . Diabetes Other    Social History  Substance Use Topics  . Smoking status: Former Smoker -- 0.10 packs/day    Types: Cigarettes  . Smokeless tobacco: Never Used  . Alcohol Use: Yes     Comment: 1-5 cans a week   OB History    Gravida Para Term Preterm AB TAB SAB Ectopic Multiple Living   0              Review of Systems  Respiratory: Positive for shortness of breath.   Cardiovascular: Negative for chest pain.  All other systems reviewed and are negative.     Allergies  Review of patient's allergies  indicates no known allergies.  Home Medications   Prior to Admission medications   Medication Sig Start Date End Date Taking? Authorizing Provider  albuterol (PROVENTIL HFA;VENTOLIN HFA) 108 (90 BASE) MCG/ACT inhaler Inhale 2 puffs into the lungs every 6 (six) hours as needed for wheezing. 02/19/15   Corwin Levins, MD  Fluticasone-Salmeterol (ADVAIR DISKUS) 250-50 MCG/DOSE AEPB Inhale 1 puff into the lungs 2 (two) times daily. 03/20/15   Nishant Dhungel, MD  guaiFENesin-dextromethorphan (ROBITUSSIN DM) 100-10 MG/5ML syrup Take 5 mLs by mouth every 4 (four) hours as needed for cough. 03/20/15   Nishant Dhungel, MD  predniSONE (DELTASONE) 20 MG tablet Take 1 tablet (20 mg total) by mouth daily with breakfast. 03/20/15   Nishant Dhungel, MD   BP 134/89 mmHg  Pulse 102  Temp(Src) 97.8 F (36.6 C) (Oral)  Resp 18  SpO2 97% Physical Exam  Constitutional: She is oriented to person, place, and time. She appears well-developed and well-nourished. No distress.  HENT:  Head: Normocephalic and atraumatic.  Neck: Normal range of motion. Neck supple.  Cardiovascular: Normal rate and regular rhythm.  Exam reveals no gallop and no friction rub.   No murmur heard. Pulmonary/Chest: Effort normal. No respiratory distress. She has wheezes.  There are bilateral expiratory wheezes present. She is able to speak in sentences without difficulty.  Abdominal: Soft. Bowel sounds are normal. She exhibits no distension. There is no tenderness.  Musculoskeletal: Normal range of motion.  Neurological: She is alert and oriented to person, place, and time.  Skin: Skin is warm and dry. She is not diaphoretic.  Nursing note and vitals reviewed.   ED Course  Procedures (including critical care time) Labs Review Labs Reviewed  BASIC METABOLIC PANEL  CBC WITH DIFFERENTIAL/PLATELET    Imaging Review No results found. I have personally reviewed and evaluated these images and lab results as part of my medical  decision-making.   EKG Interpretation   Date/Time:  Tuesday May 12 2015 09:42:31 EST Ventricular Rate:  97 PR Interval:  151 QRS Duration: 68 QT Interval:  439 QTC Calculation: 558 R Axis:   65 Text Interpretation:  Sinus rhythm Probable left atrial enlargement  Nonspecific T abnormalities, anterior leads Confirmed by Baylon Santelli  MD, Jazz Rogala  ([id number]) on 05/12/2015 9:49:50 AM      MDM   Final diagnoses:  None    Patient is a 24 year old female with history of asthma. She presents with wheezing and difficulty breathing. She is also had a cough and chest congestion. She was given albuterol, Solu-Medrol, magnesium, and has responded nicely. Her oxygen saturations are in the upper 90s and she is now laughing and in no respiratory distress. She will be discharged with prednisone, Zithromax, continued albuterol, and when necessary return.    Geoffery Lyonsouglas Harvin Konicek, MD 05/12/15 [phone]

## 2015-05-12 NOTE — ED Notes (Signed)
Pt from home with c/o SOB, cough, and congestion x "a couple of days."  Pt denies N/V/D or fever.  Pt in NAD, A&O.

## 2015-05-12 NOTE — ED Notes (Signed)
Pt placed in gown and in bed. Pt monitored by pulse ox, bp cuff, and 12-lead. Delo MD at bedside.

## 2015-05-13 ENCOUNTER — Emergency Department (HOSPITAL_COMMUNITY)
Admission: EM | Admit: 2015-05-13 | Discharge: 2015-05-13 | Disposition: A | Discharge status: Home / Self Care | Payer: 59 | Attending: Emergency Medicine | Admitting: Emergency Medicine

## 2015-05-13 ENCOUNTER — Encounter (HOSPITAL_COMMUNITY): Payer: Self-pay | Admitting: Emergency Medicine

## 2015-05-13 DIAGNOSIS — Z87440 Personal history of urinary (tract) infections: Secondary | ICD-10-CM | POA: Insufficient documentation

## 2015-05-13 DIAGNOSIS — D509 Iron deficiency anemia, unspecified: Secondary | ICD-10-CM

## 2015-05-13 DIAGNOSIS — Z79899 Other long term (current) drug therapy: Secondary | ICD-10-CM | POA: Diagnosis not present

## 2015-05-13 DIAGNOSIS — R059 Cough, unspecified: Secondary | ICD-10-CM

## 2015-05-13 DIAGNOSIS — Z8719 Personal history of other diseases of the digestive system: Secondary | ICD-10-CM | POA: Insufficient documentation

## 2015-05-13 DIAGNOSIS — Z8742 Personal history of other diseases of the female genital tract: Secondary | ICD-10-CM | POA: Insufficient documentation

## 2015-05-13 DIAGNOSIS — R197 Diarrhea, unspecified: Secondary | ICD-10-CM | POA: Diagnosis not present

## 2015-05-13 DIAGNOSIS — R6883 Chills (without fever): Secondary | ICD-10-CM | POA: Insufficient documentation

## 2015-05-13 DIAGNOSIS — R109 Unspecified abdominal pain: Secondary | ICD-10-CM | POA: Insufficient documentation

## 2015-05-13 DIAGNOSIS — R112 Nausea with vomiting, unspecified: Secondary | ICD-10-CM | POA: Diagnosis not present

## 2015-05-13 DIAGNOSIS — Z7952 Long term (current) use of systemic steroids: Secondary | ICD-10-CM | POA: Diagnosis not present

## 2015-05-13 DIAGNOSIS — Z87891 Personal history of nicotine dependence: Secondary | ICD-10-CM | POA: Insufficient documentation

## 2015-05-13 DIAGNOSIS — R05 Cough: Secondary | ICD-10-CM | POA: Diagnosis not present

## 2015-05-13 DIAGNOSIS — R61 Generalized hyperhidrosis: Secondary | ICD-10-CM | POA: Diagnosis not present

## 2015-05-13 DIAGNOSIS — D649 Anemia, unspecified: Secondary | ICD-10-CM | POA: Insufficient documentation

## 2015-05-13 DIAGNOSIS — J45901 Unspecified asthma with (acute) exacerbation: Secondary | ICD-10-CM | POA: Insufficient documentation

## 2015-05-13 LAB — COMPREHENSIVE METABOLIC PANEL
ALT: 21 U/L (ref 14–54)
AST: 24 U/L (ref 15–41)
Albumin: 3.9 g/dL (ref 3.5–5.0)
Alkaline Phosphatase: 62 U/L (ref 38–126)
Anion gap: 10 (ref 5–15)
BUN: 7 mg/dL (ref 6–20)
CO2: 19 mmol/L — ABNORMAL LOW (ref 22–32)
Calcium: 9.4 mg/dL (ref 8.9–10.3)
Chloride: 112 mmol/L — ABNORMAL HIGH (ref 101–111)
Creatinine, Ser: 0.79 mg/dL (ref 0.44–1.00)
GFR calc Af Amer: >60 mL/min (ref >60)
GFR calc non Af Amer: >60 mL/min (ref >60)
Glucose, Bld: 131 mg/dL — ABNORMAL HIGH (ref 65–99)
Potassium: 4.1 mmol/L (ref 3.5–5.1)
Sodium: 141 mmol/L (ref 135–145)
Total Bilirubin: 0.6 mg/dL (ref 0.3–1.2)
Total Protein: 7.2 g/dL (ref 6.5–8.1)

## 2015-05-13 LAB — CBC WITH DIFFERENTIAL/PLATELET
Basophils Absolute: 0.1 10*3/uL (ref 0.0–0.1)
Basophils Relative: 1 %
Eosinophils Absolute: 0 10*3/uL (ref 0.0–0.7)
Eosinophils Relative: 0 %
HCT: 34 % — ABNORMAL LOW (ref 36.0–46.0)
Hemoglobin: 10.6 g/dL — ABNORMAL LOW (ref 12.0–15.0)
Lymphocytes Relative: 14 %
Lymphs Abs: 0.9 10*3/uL (ref 0.7–4.0)
MCH: 22.6 pg — ABNORMAL LOW (ref 26.0–34.0)
MCHC: 31.2 g/dL (ref 30.0–36.0)
MCV: 72.3 fL — ABNORMAL LOW (ref 78.0–100.0)
Monocytes Absolute: 0.2 10*3/uL (ref 0.1–1.0)
Monocytes Relative: 3 %
Neutro Abs: 5.4 10*3/uL (ref 1.7–7.7)
Neutrophils Relative %: 82 %
Platelets: 357 10*3/uL (ref 150–400)
RBC: 4.7 MIL/uL (ref 3.87–5.11)
RDW: 16.2 % — ABNORMAL HIGH (ref 11.5–15.5)
WBC: 6.6 10*3/uL (ref 4.0–10.5)

## 2015-05-13 LAB — LIPASE, BLOOD: Lipase: 30 U/L (ref 11–51)

## 2015-05-13 MED ORDER — ONDANSETRON HCL 4 MG/2ML IJ SOLN: 4.0000 mg | Freq: Once | INTRAMUSCULAR | Status: DC

## 2015-05-13 MED ORDER — HYDROCODONE-ACETAMINOPHEN 5-325 MG PO TABS
1.0000 | ORAL_TABLET | Freq: Once | ORAL | Status: AC
  Administered 2015-05-13: 1 via ORAL

## 2015-05-13 MED ORDER — SODIUM CHLORIDE 0.9 % IV SOLN
1000.0000 mL | Freq: Once | INTRAVENOUS | Status: AC
  Administered 2015-05-13: 1000 mL via INTRAVENOUS

## 2015-05-13 MED ORDER — HYDROCODONE-ACETAMINOPHEN 5-325 MG PO TABS: 1.0000 | ORAL_TABLET | ORAL | Status: DC | PRN

## 2015-05-13 MED ORDER — IPRATROPIUM-ALBUTEROL 0.5-2.5 (3) MG/3ML IN SOLN
3.0000 mL | Freq: Once | RESPIRATORY_TRACT | Status: AC
  Administered 2015-05-13: 3 mL via RESPIRATORY_TRACT

## 2015-05-13 MED ORDER — SODIUM CHLORIDE 0.9 % IV SOLN
1000.0000 mL | INTRAVENOUS | Status: DC
  Administered 2015-05-13: 1000 mL via INTRAVENOUS

## 2015-05-13 MED ORDER — ONDANSETRON HCL 4 MG/2ML IJ SOLN
4.0000 mg | Freq: Once | INTRAMUSCULAR | Status: AC
  Administered 2015-05-13: 4 mg via INTRAVENOUS

## 2015-05-13 MED ORDER — ONDANSETRON HCL 4 MG PO TABS: 4.0000 mg | ORAL_TABLET | Freq: Four times a day (QID) | ORAL | Status: DC | PRN

## 2015-05-13 MED ORDER — LOPERAMIDE HCL 2 MG PO CAPS
4.0000 mg | ORAL_CAPSULE | Freq: Once | ORAL | Status: AC
  Administered 2015-05-13: 4 mg via ORAL

## 2015-05-13 NOTE — ED Notes (Signed)
Pt. reports diarrhea , nausea and generalized abdominal cramping/pain after taking Azithromycin prescribed here this morning for URI . Respirations unlabored/ no oral swelling .

## 2015-05-13 NOTE — ED Provider Notes (Signed)
CSN: [id number]   Arrival date & time 05/13/15 0007  History  By signing my name below, I, Andrea Daniels, attest that this documentation has been prepared under the direction and in the presence of Andrea Booze, MD. Electronically Signed: Bethel Daniels, ED Scribe. 05/13/2015. 12:29 AM.  Chief Complaint  Patient presents with  . Nausea  . Diarrhea    HPI The history is provided by the patient. No language interpreter was used.   Andrea Daniels is a 24 y.o. female with history of asthma who presents to the Emergency Department complaining of diarrhea with onset tonight 1 hour after taking Zithromax. Associated symptoms include nausea, 4/10 abdominal pain, and shaking. Pt was treated in the ED yesterday for a URI (with wheezing, cough productive of white sputum, chills, and sweats) and discharged with the abx and prednisone. She associates her symptoms with starting the new medication. Pt denies fever and vomiting.   Past Medical History  Diagnosis Date  . UTI (lower urinary tract infection)   . Asthma   . Gastritis   . Ovarian cyst   . Gastritis     Past Surgical History  Procedure Laterality Date  . No past surgeries      Family History  Problem Relation Age of Onset  . Hyperlipidemia Other   . Hypertension Other   . Arthritis Other   . Cancer Other     breast cancer  . Hypertension Other   . Asthma Other   . Diabetes Other     Social History  Substance Use Topics  . Smoking status: Former Smoker -- 0.10 packs/day    Types: Cigarettes  . Smokeless tobacco: Never Used  . Alcohol Use: Yes     Comment: occasionally     Review of Systems  Constitutional: Positive for chills and diaphoresis. Negative for fever.       Shaking  Respiratory: Positive for cough.   Gastrointestinal: Positive for nausea, abdominal pain and diarrhea. Negative for vomiting.  All other systems reviewed and are negative.  Home Medications   Prior to Admission medications   Medication  Sig Start Date End Date Taking? Authorizing Provider  albuterol (PROVENTIL HFA;VENTOLIN HFA) 108 (90 BASE) MCG/ACT inhaler Inhale 2 puffs into the lungs every 6 (six) hours as needed for wheezing. 02/19/15   Corwin Levins, MD  azithromycin (ZITHROMAX Z-PAK) 250 MG tablet 2 po day one, then 1 daily x 4 days 05/12/15   Geoffery Lyons, MD  Fluticasone-Salmeterol (ADVAIR DISKUS) 250-50 MCG/DOSE AEPB Inhale 1 puff into the lungs 2 (two) times daily. Patient not taking: Reported on 05/12/2015 03/20/15   Nishant Dhungel, MD  guaiFENesin-dextromethorphan (ROBITUSSIN DM) 100-10 MG/5ML syrup Take 5 mLs by mouth every 4 (four) hours as needed for cough. Patient not taking: Reported on 05/12/2015 03/20/15   Nishant Dhungel, MD  ibuprofen (ADVIL,MOTRIN) 200 MG tablet Take 400-600 mg by mouth every 6 (six) hours as needed for headache or mild pain.    Historical Provider, MD  predniSONE (DELTASONE) 10 MG tablet Take 2 tablets (20 mg total) by mouth 2 (two) times daily. 05/12/15   Geoffery Lyons, MD    Allergies  Review of patient's allergies indicates no known allergies.  Triage Vitals: BP 142/93 mmHg  Pulse 106  Temp(Src) 98.3 F (36.8 C) (Oral)  Resp 16  SpO2 94%  LMP 05/07/2015 (Exact Date)  Physical Exam  Constitutional: She is oriented to person, place, and time. She appears well-developed and well-nourished. No distress.  HENT:  Head: Normocephalic and atraumatic.  Eyes: EOM are normal. Pupils are equal, round, and reactive to light.  Neck: Normal range of motion. Neck supple. No JVD present.  Cardiovascular: Normal rate, regular rhythm and normal heart sounds.   No murmur heard. Pulmonary/Chest: Effort normal. She has wheezes. She has no rales. She exhibits no tenderness.  Faint expiratory wheezes  Abdominal: Soft. She exhibits no distension and no mass. There is no tenderness.  Bowel sounds are decreased.  Musculoskeletal: Normal range of motion. She exhibits no edema.  Lymphadenopathy:    She  has no cervical adenopathy.  Neurological: She is alert and oriented to person, place, and time. No cranial nerve deficit. She exhibits normal muscle tone. Coordination normal.  Skin: Skin is warm and dry. No rash noted.  Psychiatric: She has a normal mood and affect. Her behavior is normal. Judgment and thought content normal.  Nursing note and vitals reviewed.   ED Course  Procedures   DIAGNOSTIC STUDIES: Oxygen Saturation is 94% on RA, adequate by my interpretation.    COORDINATION OF CARE: 12:25 AM Discussed treatment plan which includes lab work, a breathing treatment, Zofran, Imodium, and IVF with pt at bedside and pt agreed to plan.  Results for orders placed or performed during the hospital encounter of 05/13/15  Comprehensive metabolic panel  Result Value Ref Range   Sodium 141 135 - 145 mmol/L   Potassium 4.1 3.5 - 5.1 mmol/L   Chloride 112 (H) 101 - 111 mmol/L   CO2 19 (L) 22 - 32 mmol/L   Glucose, Bld 131 (H) 65 - 99 mg/dL   BUN 7 6 - 20 mg/dL   Creatinine, Ser 1.61 0.44 - 1.00 mg/dL   Calcium 9.4 8.9 - 09.6 mg/dL   Total Protein 7.2 6.5 - 8.1 g/dL   Albumin 3.9 3.5 - 5.0 g/dL   AST 24 15 - 41 U/L   ALT 21 14 - 54 U/L   Alkaline Phosphatase 62 38 - 126 U/L   Total Bilirubin 0.6 0.3 - 1.2 mg/dL   GFR calc non Af Amer >60 >60 mL/min   GFR calc Af Amer >60 >60 mL/min   Anion gap 10 5 - 15  Lipase, blood  Result Value Ref Range   Lipase 30 11 - 51 U/L  CBC with Differential  Result Value Ref Range   WBC 6.6 4.0 - 10.5 K/uL   RBC 4.70 3.87 - 5.11 MIL/uL   Hemoglobin 10.6 (L) 12.0 - 15.0 g/dL   HCT 04.5 (L) 40.9 - 81.1 %   MCV 72.3 (L) 78.0 - 100.0 fL   MCH 22.6 (L) 26.0 - 34.0 pg   MCHC 31.2 30.0 - 36.0 g/dL   RDW 91.4 (H) 78.2 - 95.6 %   Platelets 357 150 - 400 K/uL   Neutrophils Relative % 82 %   Neutro Abs 5.4 1.7 - 7.7 K/uL   Lymphocytes Relative 14 %   Lymphs Abs 0.9 0.7 - 4.0 K/uL   Monocytes Relative 3 %   Monocytes Absolute 0.2 0.1 - 1.0 K/uL    Eosinophils Relative 0 %   Eosinophils Absolute 0.0 0.0 - 0.7 K/uL   Basophils Relative 1 %   Basophils Absolute 0.1 0.0 - 0.1 K/uL    I personally reviewed and evaluated these lab results as a part of my medical decision-making.    MDM   Final diagnoses:  Nausea vomiting and diarrhea  Cough  Microcytic anemia    Nausea, vomiting, diarrhea. Nausea  and vomiting may be related to azithromycin. However, diarrhea coming on one hour after taking azithromycin and makes me suspicious that there is a separate viral illness behind it. She had slight wheezing so was given albuterol with ipratropium. She's given IV fluids and ondansetron and oral loperamide. Following this, she still had some mild nausea and was given additional ondansetron. She continued to complain about coughing so she was given a dose of hydrocodone-acetaminophen. Find this, she was feeling considerably better. She is discharged with instructions to discontinue azithromycin. She is given prescriptions for ondansetron and hydrocodone-acetaminophen. Old records were reviewed confirming ED visit yesterday for asthma exacerbation with prescriptions given for prednisone and azithromycin. I personally performed the services described in this documentation, which was scribed in my presence. The recorded information has been reviewed and is accurate.      Andrea Boozeavid Cortne Amara, MD 05/13/15 [phone]

## 2015-05-13 NOTE — Discharge Instructions (Signed)
Stop taking azithromycin (Zithromax). Take loperamide (Imodium AD) as needed for diarrhea.  Nausea and Vomiting Nausea is a sick feeling that often comes before throwing up (vomiting). Vomiting is a reflex where stomach contents come out of your mouth. Vomiting can cause severe loss of body fluids (dehydration). Children and elderly adults can become dehydrated quickly, especially if they also have diarrhea. Nausea and vomiting are symptoms of a condition or disease. It is important to find the cause of your symptoms. CAUSES   Direct irritation of the stomach lining. This irritation can result from increased acid production (gastroesophageal reflux disease), infection, food poisoning, taking certain medicines (such as nonsteroidal anti-inflammatory drugs), alcohol use, or tobacco use.  Signals from the brain.These signals could be caused by a headache, heat exposure, an inner ear disturbance, increased pressure in the brain from injury, infection, a tumor, or a concussion, pain, emotional stimulus, or metabolic problems.  An obstruction in the gastrointestinal tract (bowel obstruction).  Illnesses such as diabetes, hepatitis, gallbladder problems, appendicitis, kidney problems, cancer, sepsis, atypical symptoms of a heart attack, or eating disorders.  Medical treatments such as chemotherapy and radiation.  Receiving medicine that makes you sleep (general anesthetic) during surgery. DIAGNOSIS Your caregiver may ask for tests to be done if the problems do not improve after a few days. Tests may also be done if symptoms are severe or if the reason for the nausea and vomiting is not clear. Tests may include:  Urine tests.  Blood tests.  Stool tests.  Cultures (to look for evidence of infection).  X-rays or other imaging studies. Test results can help your caregiver make decisions about treatment or the need for additional tests. TREATMENT You need to stay well hydrated. Drink frequently  but in small amounts.You may wish to drink water, sports drinks, clear broth, or eat frozen ice pops or gelatin dessert to help stay hydrated.When you eat, eating slowly may help prevent nausea.There are also some antinausea medicines that may help prevent nausea. HOME CARE INSTRUCTIONS   Take all medicine as directed by your caregiver.  If you do not have an appetite, do not force yourself to eat. However, you must continue to drink fluids.  If you have an appetite, eat a normal diet unless your caregiver tells you differently.  Eat a variety of complex carbohydrates (rice, wheat, potatoes, bread), lean meats, yogurt, fruits, and vegetables.  Avoid high-fat foods because they are more difficult to digest.  Drink enough water and fluids to keep your urine clear or pale yellow.  If you are dehydrated, ask your caregiver for specific rehydration instructions. Signs of dehydration may include:  Severe thirst.  Dry lips and mouth.  Dizziness.  Dark urine.  Decreasing urine frequency and amount.  Confusion.  Rapid breathing or pulse. SEEK IMMEDIATE MEDICAL CARE IF:   You have blood or brown flecks (like coffee grounds) in your vomit.  You have black or bloody stools.  You have a severe headache or stiff neck.  You are confused.  You have severe abdominal pain.  You have chest pain or trouble breathing.  You do not urinate at least once every 8 hours.  You develop cold or clammy skin.  You continue to vomit for longer than 24 to 48 hours.  You have a fever. MAKE SURE YOU:   Understand these instructions.  Will watch your condition.  Will get help right away if you are not doing well or get worse.   This information is not intended  to replace advice given to you by your health care provider. Make sure you discuss any questions you have with your health care provider.   Document Released: 06/13/2005 Document Revised: 09/05/2011 Document Reviewed:  11/10/2010 Elsevier Interactive Patient Education 2016 [employer]  Diarrhea Diarrhea is frequent loose and watery bowel movements. It can cause you to feel weak and dehydrated. Dehydration can cause you to become tired and thirsty, have a dry mouth, and have decreased urination that often is dark yellow. Diarrhea is a sign of another problem, most often an infection that will not last long. In most cases, diarrhea typically lasts 2-3 days. However, it can last longer if it is a sign of something more serious. It is important to treat your diarrhea as directed by your caregiver to lessen or prevent future episodes of diarrhea. CAUSES  Some common causes include:  Gastrointestinal infections caused by viruses, bacteria, or parasites.  Food poisoning or food allergies.  Certain medicines, such as antibiotics, chemotherapy, and laxatives.  Artificial sweeteners and fructose.  Digestive disorders. HOME CARE INSTRUCTIONS  Ensure adequate fluid intake (hydration): Have 1 cup (8 oz) of fluid for each diarrhea episode. Avoid fluids that contain simple sugars or sports drinks, fruit juices, whole milk products, and sodas. Your urine should be clear or pale yellow if you are drinking enough fluids. Hydrate with an oral rehydration solution that you can purchase at pharmacies, retail stores, and online. You can prepare an oral rehydration solution at home by mixing the following ingredients together:   - tsp table salt.   tsp baking soda.   tsp salt substitute containing potassium chloride.  1  tablespoons sugar.  1 L (34 oz) of water.  Certain foods and beverages may increase the speed at which food moves through the gastrointestinal (GI) tract. These foods and beverages should be avoided and include:  Caffeinated and alcoholic beverages.  High-fiber foods, such as raw fruits and vegetables, nuts, seeds, and whole grain breads and cereals.  Foods and beverages sweetened with sugar  alcohols, such as xylitol, sorbitol, and mannitol.  Some foods may be well tolerated and may help thicken stool including:  Starchy foods, such as rice, toast, pasta, low-sugar cereal, oatmeal, grits, baked potatoes, crackers, and bagels.  Bananas.  Applesauce.  Add probiotic-rich foods to help increase healthy bacteria in the GI tract, such as yogurt and fermented milk products.  Wash your hands well after each diarrhea episode.  Only take over-the-counter or prescription medicines as directed by your caregiver.  Take a warm bath to relieve any burning or pain from frequent diarrhea episodes. SEEK IMMEDIATE MEDICAL CARE IF:   You are unable to keep fluids down.  You have persistent vomiting.  You have blood in your stool, or your stools are black and tarry.  You do not urinate in 6-8 hours, or there is only a small amount of very dark urine.  You have abdominal pain that increases or localizes.  You have weakness, dizziness, confusion, or light-headedness.  You have a severe headache.  Your diarrhea gets worse or does not get better.  You have a fever or persistent symptoms for more than 2-3 days.  You have a fever and your symptoms suddenly get worse. MAKE SURE YOU:   Understand these instructions.  Will watch your condition.  Will get help right away if you are not doing well or get worse.   This information is not intended to replace advice given to you by your health care  provider. Make sure you discuss any questions you have with your health care provider.   Document Released: 06/03/2002 Document Revised: 07/04/2014 Document Reviewed: 02/19/2012 Elsevier Interactive Patient Education 2016 Elsevier Inc.  Ondansetron tablets What is this medicine? ONDANSETRON (on DAN se tron) is used to treat nausea and vomiting caused by chemotherapy. It is also used to prevent or treat nausea and vomiting after surgery. This medicine may be used for other purposes; ask  your health care provider or pharmacist if you have questions. What should I tell my health care provider before I take this medicine? They need to know if you have any of these conditions: -heart disease -history of irregular heartbeat -liver disease -low levels of magnesium or potassium in the blood -an unusual or allergic reaction to ondansetron, granisetron, other medicines, foods, dyes, or preservatives -pregnant or trying to get pregnant -breast-feeding How should I use this medicine? Take this medicine by mouth with a glass of water. Follow the directions on your prescription label. Take your doses at regular intervals. Do not take your medicine more often than directed. Talk to your pediatrician regarding the use of this medicine in children. Special care may be needed. Overdosage: If you think you have taken too much of this medicine contact a poison control center or emergency room at once. NOTE: This medicine is only for you. Do not share this medicine with others. What if I miss a dose? If you miss a dose, take it as soon as you can. If it is almost time for your next dose, take only that dose. Do not take double or extra doses. What may interact with this medicine? Do not take this medicine with any of the following medications: -apomorphine -certain medicines for fungal infections like fluconazole, itraconazole, ketoconazole, posaconazole, voriconazole -cisapride -dofetilide -dronedarone -pimozide -thioridazine -ziprasidone This medicine may also interact with the following medications: -carbamazepine -certain medicines for depression, anxiety, or psychotic disturbances -fentanyl -linezolid -MAOIs like Carbex, Eldepryl, Marplan, Nardil, and Parnate -methylene blue (injected into a vein) -other medicines that prolong the QT interval (cause an abnormal heart rhythm) -phenytoin -rifampicin -tramadol This list may not describe all possible interactions. Give your  health care provider a list of all the medicines, herbs, non-prescription drugs, or dietary supplements you use. Also tell them if you smoke, drink alcohol, or use illegal drugs. Some items may interact with your medicine. What should I watch for while using this medicine? Check with your doctor or health care professional right away if you have any sign of an allergic reaction. What side effects may I notice from receiving this medicine? Side effects that you should report to your doctor or health care professional as soon as possible: -allergic reactions like skin rash, itching or hives, swelling of the face, lips or tongue -breathing problems -confusion -dizziness -fast or irregular heartbeat -feeling faint or lightheaded, falls -fever and chills -loss of balance or coordination -seizures -sweating -swelling of the hands or feet -tightness in the chest -tremors -unusually weak or tired Side effects that usually do not require medical attention (report to your doctor or health care professional if they continue or are bothersome): -constipation or diarrhea -headache This list may not describe all possible side effects. Call your doctor for medical advice about side effects. You may report side effects to FDA at 1-800-FDA-1088. Where should I keep my medicine? Keep out of the reach of children. Store between 2 and 30 degrees C (36 and 86 degrees F). Throw away any unused  medicine after the expiration date. NOTE: This sheet is a summary. It may not cover all possible information. If you have questions about this medicine, talk to your doctor, pharmacist, or health care provider.    2016, Elsevier/Gold Standard. (2013-03-20 16:27:45)  Acetaminophen; Hydrocodone tablets or capsules What is this medicine? ACETAMINOPHEN; HYDROCODONE (a set a MEE noe fen; hye droe KOE done) is a pain reliever. It is used to treat moderate to severe pain. This medicine may be used for other purposes; ask  your health care provider or pharmacist if you have questions. What should I tell my health care provider before I take this medicine? They need to know if you have any of these conditions: -brain tumor -Crohn's disease, inflammatory bowel disease, or ulcerative colitis -drug abuse or addiction -head injury -heart or circulation problems -if you often drink alcohol -kidney disease or problems going to the bathroom -liver disease -lung disease, asthma, or breathing problems -an unusual or allergic reaction to acetaminophen, hydrocodone, other opioid analgesics, other medicines, foods, dyes, or preservatives -pregnant or trying to get pregnant -breast-feeding How should I use this medicine? Take this medicine by mouth. Swallow it with a full glass of water. Follow the directions on the prescription label. If the medicine upsets your stomach, take the medicine with food or milk. Do not take more than you are told to take. Talk to your pediatrician regarding the use of this medicine in children. This medicine is not approved for use in children. Patients over 65 years may have a stronger reaction and need a smaller dose. Overdosage: If you think you have taken too much of this medicine contact a poison control center or emergency room at once. NOTE: This medicine is only for you. Do not share this medicine with others. What if I miss a dose? If you miss a dose, take it as soon as you can. If it is almost time for your next dose, take only that dose. Do not take double or extra doses. What may interact with this medicine? -alcohol -antihistamines -isoniazid -medicines for depression, anxiety, or psychotic disturbances -medicines for sleep -muscle relaxants -naltrexone -narcotic medicines (opiates) for pain -phenobarbital -ritonavir -tramadol This list may not describe all possible interactions. Give your health care provider a list of all the medicines, herbs, non-prescription drugs, or  dietary supplements you use. Also tell them if you smoke, drink alcohol, or use illegal drugs. Some items may interact with your medicine. What should I watch for while using this medicine? Tell your doctor or health care professional if your pain does not go away, if it gets worse, or if you have new or a different type of pain. You may develop tolerance to the medicine. Tolerance means that you will need a higher dose of the medicine for pain relief. Tolerance is normal and is expected if you take the medicine for a long time. Do not suddenly stop taking your medicine because you may develop a severe reaction. Your body becomes used to the medicine. This does NOT mean you are addicted. Addiction is a behavior related to getting and using a drug for a non-medical reason. If you have pain, you have a medical reason to take pain medicine. Your doctor will tell you how much medicine to take. If your doctor wants you to stop the medicine, the dose will be slowly lowered over time to avoid any side effects. You may get drowsy or dizzy when you first start taking the medicine or change doses.  Do not drive, use machinery, or do anything that may be dangerous until you know how the medicine affects you. Stand or sit up slowly. There are different types of narcotic medicines (opiates) for pain. If you take more than one type at the same time, you may have more side effects. Give your health care provider a list of all medicines you use. Your doctor will tell you how much medicine to take. Do not take more medicine than directed. Call emergency for help if you have problems breathing. The medicine will cause constipation. Try to have a bowel movement at least every 2 to 3 days. If you do not have a bowel movement for 3 days, call your doctor or health care professional. Too much acetaminophen can be very dangerous. Do not take Tylenol (acetaminophen) or medicines that contain acetaminophen with this medicine. Many  non-prescription medicines contain acetaminophen. Always read the labels carefully. What side effects may I notice from receiving this medicine? Side effects that you should report to your doctor or health care professional as soon as possible: -allergic reactions like skin rash, itching or hives, swelling of the face, lips, or tongue -breathing problems -confusion -feeling faint or lightheaded, falls -stomach pain -yellowing of the eyes or skin Side effects that usually do not require medical attention (report to your doctor or health care professional if they continue or are bothersome): -nausea, vomiting -stomach upset This list may not describe all possible side effects. Call your doctor for medical advice about side effects. You may report side effects to FDA at 1-800-FDA-1088. Where should I keep my medicine? Keep out of the reach of children. This medicine can be abused. Keep your medicine in a safe place to protect it from theft. Do not share this medicine with anyone. Selling or giving away this medicine is dangerous and against the law. This medicine may cause accidental overdose and death if it taken by other adults, children, or pets. Mix any unused medicine with a substance like cat litter or coffee grounds. Then throw the medicine away in a sealed container like a sealed bag or a coffee can with a lid. Do not use the medicine after the expiration date. Store at room temperature between 15 and 30 degrees C (59 and 86 degrees F). NOTE: This sheet is a summary. It may not cover all possible information. If you have questions about this medicine, talk to your doctor, pharmacist, or health care provider.    2016, Elsevier/Gold Standard. (2014-05-14 15:29:20)

## 2015-05-13 NOTE — Progress Notes (Signed)
Neb started  

## 2015-06-10 ENCOUNTER — Encounter: Payer: Self-pay | Admitting: Internal Medicine

## 2015-06-10 MED ORDER — ALBUTEROL SULFATE HFA 108 (90 BASE) MCG/ACT IN AERS: 2.0000 | INHALATION_SPRAY | Freq: Four times a day (QID) | RESPIRATORY_TRACT | Status: DC | PRN

## 2015-06-20 ENCOUNTER — Emergency Department (HOSPITAL_COMMUNITY)
Admission: EM | Admit: 2015-06-20 | Discharge: 2015-06-20 | Disposition: A | Discharge status: Home / Self Care | Payer: 59 | Attending: Emergency Medicine | Admitting: Emergency Medicine

## 2015-06-20 ENCOUNTER — Encounter (HOSPITAL_COMMUNITY): Payer: Self-pay | Admitting: Emergency Medicine

## 2015-06-20 DIAGNOSIS — J45909 Unspecified asthma, uncomplicated: Secondary | ICD-10-CM | POA: Insufficient documentation

## 2015-06-20 DIAGNOSIS — Z3202 Encounter for pregnancy test, result negative: Secondary | ICD-10-CM | POA: Diagnosis not present

## 2015-06-20 DIAGNOSIS — F10120 Alcohol abuse with intoxication, uncomplicated: Secondary | ICD-10-CM | POA: Diagnosis not present

## 2015-06-20 DIAGNOSIS — Z8719 Personal history of other diseases of the digestive system: Secondary | ICD-10-CM | POA: Diagnosis not present

## 2015-06-20 DIAGNOSIS — F10920 Alcohol use, unspecified with intoxication, uncomplicated: Secondary | ICD-10-CM

## 2015-06-20 DIAGNOSIS — Z7952 Long term (current) use of systemic steroids: Secondary | ICD-10-CM | POA: Insufficient documentation

## 2015-06-20 DIAGNOSIS — R112 Nausea with vomiting, unspecified: Secondary | ICD-10-CM | POA: Insufficient documentation

## 2015-06-20 DIAGNOSIS — Z79899 Other long term (current) drug therapy: Secondary | ICD-10-CM | POA: Insufficient documentation

## 2015-06-20 DIAGNOSIS — Z87440 Personal history of urinary (tract) infections: Secondary | ICD-10-CM | POA: Diagnosis not present

## 2015-06-20 DIAGNOSIS — Z87891 Personal history of nicotine dependence: Secondary | ICD-10-CM | POA: Insufficient documentation

## 2015-06-20 DIAGNOSIS — R109 Unspecified abdominal pain: Secondary | ICD-10-CM | POA: Diagnosis present

## 2015-06-20 DIAGNOSIS — Z8742 Personal history of other diseases of the female genital tract: Secondary | ICD-10-CM | POA: Insufficient documentation

## 2015-06-20 LAB — CBC WITH DIFFERENTIAL/PLATELET
Basophils Absolute: 0.1 10*3/uL (ref 0.0–0.1)
Basophils Relative: 1 %
Eosinophils Absolute: 0.4 10*3/uL (ref 0.0–0.7)
Eosinophils Relative: 7 %
HCT: 37.3 % (ref 36.0–46.0)
Hemoglobin: 12.2 g/dL (ref 12.0–15.0)
Lymphocytes Relative: 33 %
Lymphs Abs: 1.7 10*3/uL (ref 0.7–4.0)
MCH: 23.6 pg — ABNORMAL LOW (ref 26.0–34.0)
MCHC: 32.7 g/dL (ref 30.0–36.0)
MCV: 72.3 fL — ABNORMAL LOW (ref 78.0–100.0)
Monocytes Absolute: 0.2 10*3/uL (ref 0.1–1.0)
Monocytes Relative: 3 %
Neutro Abs: 2.9 10*3/uL (ref 1.7–7.7)
Neutrophils Relative %: 56 %
Platelets: 433 10*3/uL — ABNORMAL HIGH (ref 150–400)
RBC: 5.16 MIL/uL — ABNORMAL HIGH (ref 3.87–5.11)
RDW: 15.7 % — ABNORMAL HIGH (ref 11.5–15.5)
WBC: 5.1 10*3/uL (ref 4.0–10.5)

## 2015-06-20 LAB — COMPREHENSIVE METABOLIC PANEL
ALT: 12 U/L — ABNORMAL LOW (ref 14–54)
AST: 24 U/L (ref 15–41)
Albumin: 4.1 g/dL (ref 3.5–5.0)
Alkaline Phosphatase: 74 U/L (ref 38–126)
Anion gap: 10 (ref 5–15)
BUN: 7 mg/dL (ref 6–20)
CO2: 23 mmol/L (ref 22–32)
Calcium: 8.9 mg/dL (ref 8.9–10.3)
Chloride: 108 mmol/L (ref 101–111)
Creatinine, Ser: 0.82 mg/dL (ref 0.44–1.00)
GFR calc Af Amer: >60 mL/min (ref >60)
GFR calc non Af Amer: >60 mL/min (ref >60)
Glucose, Bld: 114 mg/dL — ABNORMAL HIGH (ref 65–99)
Potassium: 3.2 mmol/L — ABNORMAL LOW (ref 3.5–5.1)
Sodium: 141 mmol/L (ref 135–145)
Total Bilirubin: 0.6 mg/dL (ref 0.3–1.2)
Total Protein: 8 g/dL (ref 6.5–8.1)

## 2015-06-20 LAB — URINALYSIS, ROUTINE W REFLEX MICROSCOPIC
Bilirubin Urine: NEGATIVE
Glucose, UA: NEGATIVE mg/dL
Hgb urine dipstick: NEGATIVE
Ketones, ur: NEGATIVE mg/dL
Leukocytes, UA: NEGATIVE
Nitrite: NEGATIVE
Protein, ur: NEGATIVE mg/dL
Specific Gravity, Urine: 1.007 (ref 1.005–1.030)
pH: 6 (ref 5.0–8.0)

## 2015-06-20 LAB — ETHANOL: Alcohol, Ethyl (B): 126 mg/dL — ABNORMAL HIGH (ref <5)

## 2015-06-20 LAB — RAPID URINE DRUG SCREEN, HOSP PERFORMED
Amphetamines: NOT DETECTED
Barbiturates: NOT DETECTED
Benzodiazepines: NOT DETECTED
Cocaine: NOT DETECTED
Opiates: NOT DETECTED
Tetrahydrocannabinol: NOT DETECTED

## 2015-06-20 LAB — POC URINE PREG, ED: Preg Test, Ur: NEGATIVE

## 2015-06-20 LAB — LIPASE, BLOOD: Lipase: 28 U/L (ref 11–51)

## 2015-06-20 MED ORDER — ONDANSETRON HCL 4 MG/2ML IJ SOLN
4.0000 mg | Freq: Once | INTRAMUSCULAR | Status: AC
Start: 2015-06-20 — End: 2015-06-20
  Administered 2015-06-20: 4 mg via INTRAVENOUS
  Filled 2015-06-20: qty 2

## 2015-06-20 MED ORDER — POTASSIUM CHLORIDE CRYS ER 20 MEQ PO TBCR
40.0000 meq | EXTENDED_RELEASE_TABLET | Freq: Once | ORAL | Status: AC
  Administered 2015-06-20: 40 meq via ORAL
  Filled 2015-06-20: qty 2

## 2015-06-20 MED ORDER — ONDANSETRON 4 MG PO TBDP: 4.0000 mg | ORAL_TABLET | Freq: Three times a day (TID) | ORAL | Status: DC | PRN

## 2015-06-20 MED ORDER — SODIUM CHLORIDE 0.9 % IV BOLUS (SEPSIS)
1000.0000 mL | Freq: Once | INTRAVENOUS | Status: AC
  Administered 2015-06-20: 1000 mL via INTRAVENOUS

## 2015-06-20 NOTE — ED Provider Notes (Signed)
By signing my name below, I, Evon Slackerrance Branch, attest that this documentation has been prepared under the direction and in the presence of No att. providers found. Electronically Signed: Evon Slackerrance Branch, ED Scribe. 06/20/2015. 3:17 AM.  TIME SEEN: 3:41 AM   CHIEF COMPLAINT: .nausea and vomiting   HPI: HPI Comments: Andrea Daniels is a 24 y.o. female with history of gastritis, ovarian cysts, asthma who presents to the Emergency Department complaining of nausea and vomiting onset tonight after drinking too much alcohol. Pt reports slight abdominal pain as well. Pt denies diarrhea, dysuria, hematuria, vaginal bleeding or discharge. Denies history of abdominal surgery. She started drinking at 11 PM last night. Had multiple liquor drinks. Denies drug use.   ROS: See HPI Constitutional: no fever  Eyes: no drainage  ENT: no runny nose   Cardiovascular:  no chest pain  Resp: no SOB  GI: vomiting GU: no dysuria Integumentary: no rash  Allergy: no hives  Musculoskeletal: no leg swelling  Neurological: no slurred speech ROS otherwise negative  PAST MEDICAL HISTORY/PAST SURGICAL HISTORY:  Past Medical History  Diagnosis Date  . UTI (lower urinary tract infection)   . Asthma   . Gastritis   . Ovarian cyst   . Gastritis     MEDICATIONS:  Prior to Admission medications   Medication Sig Start Date End Date Taking? Authorizing Provider  albuterol (PROVENTIL HFA;VENTOLIN HFA) 108 (90 BASE) MCG/ACT inhaler Inhale 2 puffs into the lungs every 6 (six) hours as needed for wheezing. 06/10/15   Corwin LevinsJames W John, MD  HYDROcodone-acetaminophen (NORCO) 5-325 MG tablet Take 1 tablet by mouth every 4 (four) hours as needed for moderate pain (or coughing). 05/13/15   Dione Boozeavid Glick, MD  ibuprofen (ADVIL,MOTRIN) 200 MG tablet Take 400-600 mg by mouth every 6 (six) hours as needed for headache or mild pain.    Historical Provider, MD  ondansetron (ZOFRAN) 4 MG tablet Take 1 tablet (4 mg total) by mouth every 6  (six) hours as needed for nausea or vomiting. 05/13/15   Dione Boozeavid Glick, MD  predniSONE (DELTASONE) 10 MG tablet Take 2 tablets (20 mg total) by mouth 2 (two) times daily. 05/12/15   Geoffery Lyonsouglas Delo, MD    ALLERGIES:  No Known Allergies  SOCIAL HISTORY:  Social History  Substance Use Topics  . Smoking status: Former Smoker -- 0.10 packs/day    Types: Cigarettes  . Smokeless tobacco: Never Used  . Alcohol Use: Yes     Comment: occasionally    FAMILY HISTORY: Family History  Problem Relation Age of Onset  . Hyperlipidemia Other   . Hypertension Other   . Arthritis Other   . Cancer Other     breast cancer  . Hypertension Other   . Asthma Other   . Diabetes Other     EXAM: BP 125/79 mmHg  Pulse 88  Temp(Src) 97.4 F (36.3 C) (Oral)  Resp 20  SpO2 100%  LMP 06/18/2015 CONSTITUTIONAL: Alert and oriented and responds appropriately to questions. Well-appearing; well-nourished, appears intoxicated and smells of alcohol HEAD: Normocephalic EYES: Conjunctivae clear, PERRL ENT: normal nose; no rhinorrhea; moist mucous membranes; pharynx without lesions noted NECK: Supple, no meningismus, no LAD  CARD: RRR; S1 and S2 appreciated; no murmurs, no clicks, no rubs, no gallops RESP: Normal chest excursion without splinting or tachypnea; breath sounds clear and equal bilaterally; no wheezes, no rhonchi, no rales, no hypoxia or respiratory distress, speaking full sentences ABD/GI: Normal bowel sounds; non-distended; soft, non-tender, no rebound, no guarding, no  peritoneal signs BACK:  The back appears normal and is non-tender to palpation, there is no CVA tenderness EXT: Normal ROM in all joints; non-tender to palpation; no edema; normal capillary refill; no cyanosis, no calf tenderness or swelling    SKIN: Normal color for age and race; warm NEURO: Moves all extremities equally, sensation to light touch intact diffusely, cranial nerves II through XII intact PSYCH: The patient's mood and  manner are appropriate. Grooming and personal hygiene are appropriate.  MEDICAL DECISION MAKING: Here with vomiting after drinking too much alcohol. She appears intoxicated but is protecting her airway and hemodynamically stable. Abdominal exam is benign. We'll obtain labs, urine. Will give IV fluids and Zofran and reassess.  ED PROGRESS: Patient's labs showed alcohol level of 126. Potassium is 3.2, I have replaced this. Urine shows no sign of infection. Urine drug screen negative. Pregnancy test is negative. Repeat abdominal exam is benign. She's been able to drink and has a monitor until clinically sober. Her significant other is at bedside to take her home. I feel she is safe for discharge. We'll discharge with prescription for Zofran have advised her to avoid alcohol. Discussed return precautions. They verbalize understanding and are comfortable with this plan.   I personally performed the services described in this documentation, which was scribed in my presence. The recorded information has been reviewed and is accurate.   Layla Maw Ward, DO 06/20/15 [id number]

## 2015-06-20 NOTE — ED Notes (Signed)
Per EMS, pt from home with c/o nausea, vomiting and abdominal pain. Pt stated she has been drinking liquor since 2300. VSS

## 2015-06-20 NOTE — ED Notes (Signed)
Pt drinking ginger ale. No nausea or vomiting

## 2015-06-20 NOTE — Discharge Instructions (Signed)
Alcohol Intoxication Alcohol intoxication occurs when the amount of alcohol that a person has consumed impairs his or her ability to mentally and physically function. Alcohol directly impairs the normal chemical activity of the brain. Drinking large amounts of alcohol can lead to changes in mental function and behavior, and it can cause many physical effects that can be harmful.  Alcohol intoxication can range in severity from mild to very severe. Various factors can affect the level of intoxication that occurs, such as the person's age, gender, weight, frequency of alcohol consumption, and the presence of other medical conditions (such as diabetes, seizures, or heart conditions). Dangerous levels of alcohol intoxication may occur when people drink large amounts of alcohol in a short period (binge drinking). Alcohol can also be especially dangerous when combined with certain prescription medicines or "recreational" drugs. SIGNS AND SYMPTOMS Some common signs and symptoms of mild alcohol intoxication include:  Loss of coordination.  Changes in mood and behavior.  Impaired judgment.  Slurred speech. As alcohol intoxication progresses to more severe levels, other signs and symptoms will appear. These may include:  Vomiting.  Confusion and impaired memory.  Slowed breathing.  Seizures.  Loss of consciousness. DIAGNOSIS  Your health care provider will take a medical history and perform a physical exam. You will be asked about the amount and type of alcohol you have consumed. Blood tests will be done to measure the concentration of alcohol in your blood. In many places, your blood alcohol level must be lower than 80 mg/dL (0.08%) to legally drive. However, many dangerous effects of alcohol can occur at much lower levels.  TREATMENT  People with alcohol intoxication often do not require treatment. Most of the effects of alcohol intoxication are temporary, and they go away as the alcohol naturally  leaves the body. Your health care provider will monitor your condition until you are stable enough to go home. Fluids are sometimes given through an IV access tube to help prevent dehydration.  HOME CARE INSTRUCTIONS  Do not drive after drinking alcohol.  Stay hydrated. Drink enough water and fluids to keep your urine clear or pale yellow. Avoid caffeine.   Only take over-the-counter or prescription medicines as directed by your health care provider.  SEEK MEDICAL CARE IF:   You have persistent vomiting.   You do not feel better after a few days.  You have frequent alcohol intoxication. Your health care provider can help determine if you should see a substance use treatment counselor. SEEK IMMEDIATE MEDICAL CARE IF:   You become shaky or tremble when you try to stop drinking.   You shake uncontrollably (seizure).   You throw up (vomit) blood. This may be bright red or may look like black coffee grounds.   You have blood in your stool. This may be bright red or may appear as a black, tarry, bad smelling stool.   You become lightheaded or faint.  MAKE SURE YOU:   Understand these instructions.  Will watch your condition.  Will get help right away if you are not doing well or get worse.   This information is not intended to replace advice given to you by your health care provider. Make sure you discuss any questions you have with your health care provider.   Document Released: 03/23/2005 Document Revised: 02/13/2013 Document Reviewed: 11/16/2012 Elsevier Interactive Patient Education 2016 [employer]  Nausea and Vomiting Nausea is a sick feeling that often comes before throwing up (vomiting). Vomiting is a reflex where stomach  contents come out of your mouth. Vomiting can cause severe loss of body fluids (dehydration). Children and elderly adults can become dehydrated quickly, especially if they also have diarrhea. Nausea and vomiting are symptoms of a condition or  disease. It is important to find the cause of your symptoms. CAUSES   Direct irritation of the stomach lining. This irritation can result from increased acid production (gastroesophageal reflux disease), infection, food poisoning, taking certain medicines (such as nonsteroidal anti-inflammatory drugs), alcohol use, or tobacco use.  Signals from the brain.These signals could be caused by a headache, heat exposure, an inner ear disturbance, increased pressure in the brain from injury, infection, a tumor, or a concussion, pain, emotional stimulus, or metabolic problems.  An obstruction in the gastrointestinal tract (bowel obstruction).  Illnesses such as diabetes, hepatitis, gallbladder problems, appendicitis, kidney problems, cancer, sepsis, atypical symptoms of a heart attack, or eating disorders.  Medical treatments such as chemotherapy and radiation.  Receiving medicine that makes you sleep (general anesthetic) during surgery. DIAGNOSIS Your caregiver may ask for tests to be done if the problems do not improve after a few days. Tests may also be done if symptoms are severe or if the reason for the nausea and vomiting is not clear. Tests may include:  Urine tests.  Blood tests.  Stool tests.  Cultures (to look for evidence of infection).  X-rays or other imaging studies. Test results can help your caregiver make decisions about treatment or the need for additional tests. TREATMENT You need to stay well hydrated. Drink frequently but in small amounts.You may wish to drink water, sports drinks, clear broth, or eat frozen ice pops or gelatin dessert to help stay hydrated.When you eat, eating slowly may help prevent nausea.There are also some antinausea medicines that may help prevent nausea. HOME CARE INSTRUCTIONS   Take all medicine as directed by your caregiver.  If you do not have an appetite, do not force yourself to eat. However, you must continue to drink fluids.  If you  have an appetite, eat a normal diet unless your caregiver tells you differently.  Eat a variety of complex carbohydrates (rice, wheat, potatoes, bread), lean meats, yogurt, fruits, and vegetables.  Avoid high-fat foods because they are more difficult to digest.  Drink enough water and fluids to keep your urine clear or pale yellow.  If you are dehydrated, ask your caregiver for specific rehydration instructions. Signs of dehydration may include:  Severe thirst.  Dry lips and mouth.  Dizziness.  Dark urine.  Decreasing urine frequency and amount.  Confusion.  Rapid breathing or pulse. SEEK IMMEDIATE MEDICAL CARE IF:   You have blood or brown flecks (like coffee grounds) in your vomit.  You have black or bloody stools.  You have a severe headache or stiff neck.  You are confused.  You have severe abdominal pain.  You have chest pain or trouble breathing.  You do not urinate at least once every 8 hours.  You develop cold or clammy skin.  You continue to vomit for longer than 24 to 48 hours.  You have a fever. MAKE SURE YOU:   Understand these instructions.  Will watch your condition.  Will get help right away if you are not doing well or get worse.   This information is not intended to replace advice given to you by your health care provider. Make sure you discuss any questions you have with your health care provider.   Document Released: 06/13/2005 Document Revised: 09/05/2011 Document  You develop cold or clammy skin.   You continue to vomit for longer than 24 to 48 hours.   You have a fever.  MAKE SURE YOU:    Understand these instructions.   Will watch your condition.   Will get help right away if you are not doing well or get worse.     This information is not intended to replace advice given to you by your health care provider. Make sure you discuss any questions you have with your health care provider.     Document Released: 06/13/2005 Document Revised: 09/05/2011 Document Reviewed: 11/10/2010  Elsevier Interactive Patient Education 2016 [employer]

## 2015-07-13 ENCOUNTER — Emergency Department (HOSPITAL_COMMUNITY)
Admission: EM | Admit: 2015-07-13 | Discharge: 2015-07-14 | Disposition: A | Discharge status: Home / Self Care | Payer: 59 | Attending: Emergency Medicine | Admitting: Emergency Medicine

## 2015-07-13 ENCOUNTER — Encounter (HOSPITAL_COMMUNITY): Payer: Self-pay | Admitting: Family Medicine

## 2015-07-13 DIAGNOSIS — Z79899 Other long term (current) drug therapy: Secondary | ICD-10-CM | POA: Diagnosis not present

## 2015-07-13 DIAGNOSIS — J45901 Unspecified asthma with (acute) exacerbation: Secondary | ICD-10-CM | POA: Insufficient documentation

## 2015-07-13 DIAGNOSIS — R05 Cough: Secondary | ICD-10-CM

## 2015-07-13 DIAGNOSIS — Z8742 Personal history of other diseases of the female genital tract: Secondary | ICD-10-CM | POA: Diagnosis not present

## 2015-07-13 DIAGNOSIS — Z8719 Personal history of other diseases of the digestive system: Secondary | ICD-10-CM | POA: Diagnosis not present

## 2015-07-13 DIAGNOSIS — R059 Cough, unspecified: Secondary | ICD-10-CM

## 2015-07-13 DIAGNOSIS — Z87440 Personal history of urinary (tract) infections: Secondary | ICD-10-CM | POA: Diagnosis not present

## 2015-07-13 DIAGNOSIS — R0602 Shortness of breath: Secondary | ICD-10-CM | POA: Diagnosis present

## 2015-07-13 DIAGNOSIS — Z87891 Personal history of nicotine dependence: Secondary | ICD-10-CM | POA: Insufficient documentation

## 2015-07-13 LAB — URINALYSIS, ROUTINE W REFLEX MICROSCOPIC
Bilirubin Urine: NEGATIVE
Glucose, UA: NEGATIVE mg/dL
Hgb urine dipstick: NEGATIVE
Ketones, ur: NEGATIVE mg/dL
Leukocytes, UA: NEGATIVE
Nitrite: NEGATIVE
Protein, ur: NEGATIVE mg/dL
Specific Gravity, Urine: 1.027 (ref 1.005–1.030)
pH: 6.5 (ref 5.0–8.0)

## 2015-07-13 LAB — POC URINE PREG, ED: Preg Test, Ur: NEGATIVE

## 2015-07-13 NOTE — ED Notes (Signed)
Pt is complaining of abd pain with nausea.

## 2015-07-13 NOTE — ED Notes (Signed)
Per EMS pt has cough for the past 2 weeks with green sputum  Pt states she has been using her inhaler without relief  Pt initially had inspiratory and expiratory wheezing that have improved with breathing treatment

## 2015-07-13 NOTE — ED Notes (Signed)
Made two unsuccessful attempts to draw blood from pt..Andrea Daniels

## 2015-07-14 ENCOUNTER — Emergency Department (HOSPITAL_COMMUNITY): Payer: 59

## 2015-07-14 LAB — COMPREHENSIVE METABOLIC PANEL
ALT: 14 U/L (ref 14–54)
AST: 22 U/L (ref 15–41)
Albumin: 4.1 g/dL (ref 3.5–5.0)
Alkaline Phosphatase: 62 U/L (ref 38–126)
Anion gap: 9 (ref 5–15)
BUN: 9 mg/dL (ref 6–20)
CO2: 23 mmol/L (ref 22–32)
Calcium: 8.8 mg/dL — ABNORMAL LOW (ref 8.9–10.3)
Chloride: 111 mmol/L (ref 101–111)
Creatinine, Ser: 0.82 mg/dL (ref 0.44–1.00)
GFR calc Af Amer: >60 mL/min (ref >60)
GFR calc non Af Amer: >60 mL/min (ref >60)
Glucose, Bld: 122 mg/dL — ABNORMAL HIGH (ref 65–99)
Potassium: 3.4 mmol/L — ABNORMAL LOW (ref 3.5–5.1)
Sodium: 143 mmol/L (ref 135–145)
Total Bilirubin: 0.5 mg/dL (ref 0.3–1.2)
Total Protein: 7.6 g/dL (ref 6.5–8.1)

## 2015-07-14 LAB — CBC
HCT: 32.6 % — ABNORMAL LOW (ref 36.0–46.0)
Hemoglobin: 10.3 g/dL — ABNORMAL LOW (ref 12.0–15.0)
MCH: 23.1 pg — ABNORMAL LOW (ref 26.0–34.0)
MCHC: 31.6 g/dL (ref 30.0–36.0)
MCV: 73.3 fL — ABNORMAL LOW (ref 78.0–100.0)
Platelets: 346 10*3/uL (ref 150–400)
RBC: 4.45 MIL/uL (ref 3.87–5.11)
RDW: 15.8 % — ABNORMAL HIGH (ref 11.5–15.5)
WBC: 9.2 10*3/uL (ref 4.0–10.5)

## 2015-07-14 LAB — LIPASE, BLOOD: Lipase: 29 U/L (ref 11–51)

## 2015-07-14 MED ORDER — DIPHENHYDRAMINE HCL 25 MG PO CAPS
25.0000 mg | ORAL_CAPSULE | Freq: Once | ORAL | Status: AC
  Administered 2015-07-14: 25 mg via ORAL
  Filled 2015-07-14: qty 1

## 2015-07-14 MED ORDER — METOCLOPRAMIDE HCL 10 MG PO TABS
5.0000 mg | ORAL_TABLET | Freq: Once | ORAL | Status: AC
  Administered 2015-07-14: 5 mg via ORAL
  Filled 2015-07-14: qty 1

## 2015-07-14 MED ORDER — KETOROLAC TROMETHAMINE 30 MG/ML IJ SOLN
30.0000 mg | Freq: Once | INTRAMUSCULAR | Status: AC
  Administered 2015-07-14: 30 mg via INTRAMUSCULAR
  Filled 2015-07-14: qty 1

## 2015-07-14 MED ORDER — BENZONATATE 100 MG PO CAPS: 100.0000 mg | ORAL_CAPSULE | Freq: Three times a day (TID) | ORAL | Status: DC | PRN

## 2015-07-14 NOTE — ED Provider Notes (Signed)
CSN: [id number]     Arrival date & time 07/13/15  2249 History   First MD Initiated Contact with Patient 07/14/15 [phone]     Chief Complaint  Patient presents with  . Shortness of Breath   HPI   25 year old female presents today with complaints of cough and shortness of breath. Patient was brought via EMS was called out for cough and wheeze been noted inspiratory and expiratory wheeze, with breathing treatment given in route which significantly improved patient's symptoms. Patient reports that 3 weeks ago she developed a cough with associated upper respiratory symptoms. She notes that over the last 3 weeks the cough has persisted with some rhinorrhea, which has improved. She notes the cough is worse when she lays down at night, causing her to stay awake throughout the night. She does report shortness of breath, sitting this is similar to her asthma type symptoms and worsened with coughing fits. At baseline she does not feel she is short of breath, she denies any chest pain. She also reports some slight nausea and generalized abdominal discomfort but no acute abdominal pain. She reports that she is using her albuterol at home that is not significantly helping her. She notes that she frequently gets upper respiratory infections, and asthma flares. Patient denies any smoking history, denies any fever today, vomiting, chest pain, active shortness of breath presently.    Past Medical History  Diagnosis Date  . UTI (lower urinary tract infection)   . Asthma   . Gastritis   . Ovarian cyst   . Gastritis    Past Surgical History  Procedure Laterality Date  . No past surgeries     Family History  Problem Relation Age of Onset  . Hyperlipidemia Other   . Hypertension Other   . Arthritis Other   . Cancer Other     breast cancer  . Hypertension Other   . Asthma Other   . Diabetes Other    Social History  Substance Use Topics  . Smoking status: Former Smoker -- 0.10 packs/day    Types:  Cigarettes  . Smokeless tobacco: Never Used  . Alcohol Use: Yes     Comment: "Not often"   OB History    Gravida Para Term Preterm AB TAB SAB Ectopic Multiple Living   0              Review of Systems  All other systems reviewed and are negative.   Allergies  Review of patient's allergies indicates no known allergies.  Home Medications   Prior to Admission medications   Medication Sig Start Date End Date Taking? Authorizing Provider  albuterol (PROVENTIL HFA;VENTOLIN HFA) 108 (90 BASE) MCG/ACT inhaler Inhale 2 puffs into the lungs every 6 (six) hours as needed for wheezing. 06/10/15  Yes Corwin Levins, MD  ibuprofen (ADVIL,MOTRIN) 200 MG tablet Take 400-600 mg by mouth every 6 (six) hours as needed for headache or mild pain.   Yes Historical Provider, MD  benzonatate (TESSALON PERLES) 100 MG capsule Take 1 capsule (100 mg total) by mouth 3 (three) times daily as needed for cough. 07/14/15   Eyvonne Mechanic, PA-C  HYDROcodone-acetaminophen (NORCO) 5-325 MG tablet Take 1 tablet by mouth every 4 (four) hours as needed for moderate pain (or coughing). Patient not taking: Reported on 07/13/2015 05/13/15   [name], MD  ondansetron (ZOFRAN ODT) 4 MG disintegrating tablet Take 1 tablet (4 mg total) by mouth every 8 (eight) hours as needed for nausea or vomiting. Patient  not taking: Reported on 07/13/2015 06/20/15   Kristen N Ward, DO  ondansetron (ZOFRAN) 4 MG tablet Take 1 tablet (4 mg total) by mouth every 6 (six) hours as needed for nausea or vomiting. Patient not taking: Reported on 07/13/2015 05/13/15   [name], MD   BP 143/96 mmHg  Pulse 77  Temp(Src) 98 F (36.7 C) (Oral)  Resp 19  Ht  (1.499 m)  Wt 68.493 kg  BMI 30.48 kg/m2  SpO2 96%  LMP 06/18/2015 Physical Exam  Constitutional: She is oriented to person, place, and time. She appears well-developed and well-nourished.  HENT:  Head: Normocephalic and atraumatic.  Eyes: Conjunctivae are normal. Pupils are equal,  round, and reactive to light. Right eye exhibits no discharge. Left eye exhibits no discharge. No scleral icterus.  Neck: Normal range of motion. No JVD present. No tracheal deviation present.  Cardiovascular: Normal rate, regular rhythm, normal heart sounds and intact distal pulses.  Exam reveals no gallop and no friction rub.   No murmur heard. Pulmonary/Chest: Effort normal and breath sounds normal. No stridor. No respiratory distress. She has no wheezes. She has no rales. She exhibits no tenderness.  Musculoskeletal: Normal range of motion. She exhibits no edema or tenderness.  Neurological: She is alert and oriented to person, place, and time. Coordination normal.  Skin: Skin is warm and dry. No rash noted. No erythema. No pallor.  Psychiatric: She has a normal mood and affect. Her behavior is normal. Judgment and thought content normal.  Nursing note and vitals reviewed.   ED Course  Procedures (including critical care time) Labs Review Labs Reviewed  COMPREHENSIVE METABOLIC PANEL - Abnormal; Notable for the following:    Potassium 3.4 (*)    Glucose, Bld 122 (*)    Calcium 8.8 (*)    All other components within normal limits  CBC - Abnormal; Notable for the following:    Hemoglobin 10.3 (*)    HCT 32.6 (*)    MCV 73.3 (*)    MCH 23.1 (*)    RDW 15.8 (*)    All other components within normal limits  LIPASE, BLOOD  URINALYSIS, ROUTINE W REFLEX MICROSCOPIC (NOT AT [facility])  POC URINE PREG, ED    Imaging Review Dg Chest 2 View  07/14/2015  CLINICAL DATA:  25 year old female with shortness of breath and productive cough. EXAM: CHEST  2 VIEW COMPARISON:  Radiograph dated 05/12/2015 FINDINGS: The heart size and mediastinal contours are within normal limits. Both lungs are clear. The visualized skeletal structures are unremarkable. IMPRESSION: No active cardiopulmonary disease. Electronically Signed   By: Elgie Collard M.D.   On: 07/14/2015 04:40   I have personally reviewed and  evaluated these images and lab results as part of my medical decision-making.   EKG Interpretation None      MDM   Final diagnoses:  Cough    Labs:  Imaging:  Consults:  Therapeutics:  Discharge Meds:   Assessment/Plan: Patient's presentation most consistent with asthma exacerbation. This most likely is due to her recent upper respiratory infection. At the time of her arrival to the ED her oxygen saturation was 100%, at the time of my evaluation she had clear lung sounds with no need for further breathing treatment. She is afebrile, nontoxic, with a normal chest x-ray. No elevation in WBC. Based on above information and clinical history have low suspicion for PE, pneumonia, or any other significant acutely life threatening illness. Patient will be instructed to use her albuterol  inhaler as needed for symptomatic relief, nasal saline rinses for sinus congestion and rhinorrhea likely contributing to her cough. She is instructed to follow-up with her primary care provider for further evaluation and management of her frequent asthma flares. She is given strict return precautions, verbalized understanding and agreement to this plan and had no further questions or concerns at time of discharge         Eyvonne Mechanic, PA-C 07/14/15 1952  Laurence Spates, MD 07/14/15 2045

## 2015-07-14 NOTE — Discharge Instructions (Signed)

## 2015-07-20 ENCOUNTER — Emergency Department (HOSPITAL_COMMUNITY)
Admission: EM | Admit: 2015-07-20 | Discharge: 2015-07-20 | Disposition: A | Discharge status: Home / Self Care | Payer: 59 | Attending: Emergency Medicine | Admitting: Emergency Medicine

## 2015-07-20 ENCOUNTER — Encounter (HOSPITAL_COMMUNITY): Payer: Self-pay

## 2015-07-20 DIAGNOSIS — J45901 Unspecified asthma with (acute) exacerbation: Secondary | ICD-10-CM | POA: Insufficient documentation

## 2015-07-20 DIAGNOSIS — Z8742 Personal history of other diseases of the female genital tract: Secondary | ICD-10-CM | POA: Diagnosis not present

## 2015-07-20 DIAGNOSIS — Z8719 Personal history of other diseases of the digestive system: Secondary | ICD-10-CM | POA: Diagnosis not present

## 2015-07-20 DIAGNOSIS — R059 Cough, unspecified: Secondary | ICD-10-CM

## 2015-07-20 DIAGNOSIS — Z87440 Personal history of urinary (tract) infections: Secondary | ICD-10-CM | POA: Insufficient documentation

## 2015-07-20 DIAGNOSIS — R05 Cough: Secondary | ICD-10-CM

## 2015-07-20 DIAGNOSIS — R062 Wheezing: Secondary | ICD-10-CM

## 2015-07-20 DIAGNOSIS — Z87891 Personal history of nicotine dependence: Secondary | ICD-10-CM | POA: Insufficient documentation

## 2015-07-20 DIAGNOSIS — J029 Acute pharyngitis, unspecified: Secondary | ICD-10-CM | POA: Insufficient documentation

## 2015-07-20 MED ORDER — IPRATROPIUM-ALBUTEROL 0.5-2.5 (3) MG/3ML IN SOLN
3.0000 mL | Freq: Once | RESPIRATORY_TRACT | Status: AC
  Administered 2015-07-20: 3 mL via RESPIRATORY_TRACT
  Filled 2015-07-20: qty 3

## 2015-07-20 MED ORDER — PREDNISONE 20 MG PO TABS
60.0000 mg | ORAL_TABLET | Freq: Once | ORAL | Status: AC
  Administered 2015-07-20: 60 mg via ORAL
  Filled 2015-07-20: qty 3

## 2015-07-20 MED ORDER — ALBUTEROL SULFATE HFA 108 (90 BASE) MCG/ACT IN AERS: 2.0000 | INHALATION_SPRAY | RESPIRATORY_TRACT | Status: DC | PRN

## 2015-07-20 MED ORDER — PREDNISONE 20 MG PO TABS: 40.0000 mg | ORAL_TABLET | Freq: Every day | ORAL | Status: DC

## 2015-07-20 MED ORDER — HYDROCODONE-HOMATROPINE 5-1.5 MG/5ML PO SYRP: 5.0000 mL | ORAL_SOLUTION | Freq: Four times a day (QID) | ORAL | Status: DC | PRN

## 2015-07-20 MED ORDER — AZITHROMYCIN 250 MG PO TABS: 250.0000 mg | ORAL_TABLET | Freq: Every day | ORAL | Status: DC

## 2015-07-20 MED ORDER — ALBUTEROL SULFATE HFA 108 (90 BASE) MCG/ACT IN AERS
2.0000 | INHALATION_SPRAY | RESPIRATORY_TRACT | Status: DC | PRN
  Filled 2015-07-20: qty 6.7

## 2015-07-20 NOTE — ED Provider Notes (Signed)
CSN: [id number]     Arrival date & time 07/20/15  1143 History   First MD Initiated Contact with Patient 07/20/15 1201     Chief Complaint  Patient presents with  . Cough     (Consider location/radiation/quality/duration/timing/severity/associated sxs/prior Treatment) HPI Comments: Patient presents to the emergency department with chief complaint of cough. She has had ongoing cough and congestion for the past several weeks. She reports history of asthma. States that she has been wheezing. She reports productive cough. She denies any fevers chills. There are no aggravating or alleviating factors. She has tried using her inhaler, but has run out of medication. She denies any nausea, vomiting, diarrhea.  The history is provided by the patient. No language interpreter was used.    Past Medical History  Diagnosis Date  . UTI (lower urinary tract infection)   . Asthma   . Gastritis   . Ovarian cyst   . Gastritis    Past Surgical History  Procedure Laterality Date  . No past surgeries     Family History  Problem Relation Age of Onset  . Hyperlipidemia Other   . Hypertension Other   . Arthritis Other   . Cancer Other     breast cancer  . Hypertension Other   . Asthma Other   . Diabetes Other    Social History  Substance Use Topics  . Smoking status: Former Smoker -- 0.10 packs/day    Types: Cigarettes  . Smokeless tobacco: Never Used  . Alcohol Use: Yes     Comment: "Not often"   OB History    Gravida Para Term Preterm AB TAB SAB Ectopic Multiple Living   0              Review of Systems  Constitutional: Negative for fever and chills.  HENT: Positive for postnasal drip, rhinorrhea, sinus pressure, sneezing and sore throat.   Respiratory: Positive for cough. Negative for shortness of breath.   Cardiovascular: Negative for chest pain.  Gastrointestinal: Negative for nausea, vomiting, abdominal pain, diarrhea and constipation.  Genitourinary: Negative for dysuria.       Allergies  Review of patient's allergies indicates no known allergies.  Home Medications   Prior to Admission medications   Medication Sig Start Date End Date Taking? Authorizing Provider  albuterol (PROVENTIL HFA;VENTOLIN HFA) 108 (90 BASE) MCG/ACT inhaler Inhale 2 puffs into the lungs every 6 (six) hours as needed for wheezing. 06/10/15   Corwin Levins, MD  benzonatate (TESSALON PERLES) 100 MG capsule Take 1 capsule (100 mg total) by mouth 3 (three) times daily as needed for cough. 07/14/15   Eyvonne Mechanic, PA-C  HYDROcodone-acetaminophen (NORCO) 5-325 MG tablet Take 1 tablet by mouth every 4 (four) hours as needed for moderate pain (or coughing). Patient not taking: Reported on 07/13/2015 05/13/15   [name], MD  ibuprofen (ADVIL,MOTRIN) 200 MG tablet Take 400-600 mg by mouth every 6 (six) hours as needed for headache or mild pain.    Historical Provider, MD  ondansetron (ZOFRAN ODT) 4 MG disintegrating tablet Take 1 tablet (4 mg total) by mouth every 8 (eight) hours as needed for nausea or vomiting. Patient not taking: Reported on 07/13/2015 06/20/15   Kristen N Ward, DO  ondansetron (ZOFRAN) 4 MG tablet Take 1 tablet (4 mg total) by mouth every 6 (six) hours as needed for nausea or vomiting. Patient not taking: Reported on 07/13/2015 05/13/15   [name], MD   BP 152/98 mmHg  Pulse 97  Temp(Src) 98.5 F (36.9 C) (Oral)  Resp 16  Ht  (1.499 m)  Wt 68.493 kg  BMI 30.48 kg/m2  SpO2 97%  LMP 06/18/2015 Physical Exam Physical Exam  Constitutional: Pt  is oriented to person, place, and time. Appears well-developed and well-nourished. No distress.  HENT:  Head: Normocephalic and atraumatic.  Right Ear: Tympanic membrane, external ear and ear canal normal.  Left Ear: Tympanic membrane, external ear and ear canal normal.  Nose: Mucosal edema and moderate rhinorrhea present. No epistaxis. Right sinus exhibits no maxillary sinus tenderness and no frontal sinus tenderness.  Left sinus exhibits no maxillary sinus tenderness and no frontal sinus tenderness.  Mouth/Throat: Uvula is midline and mucous membranes are normal. Mucous membranes are not pale and not cyanotic. No oropharyngeal exudate, posterior oropharyngeal edema, posterior oropharyngeal erythema or tonsillar abscesses.  Eyes: Conjunctivae are normal. Pupils are equal, round, and reactive to light.  Neck: Normal range of motion and full passive range of motion without pain.  Cardiovascular: Normal rate and intact distal pulses.   Pulmonary/Chest: Effort normal and breath sounds normal. No stridor.  Clear and equal breath sounds mild end expiratory wheezes, no rhonchi or rales  Abdominal: Soft. Bowel sounds are normal. There is no tenderness.  Musculoskeletal: Normal range of motion.  Lymphadenopathy:    Pthas no cervical adenopathy.  Neurological: Pt is alert and oriented to person, place, and time.  Skin: Skin is warm and dry. No rash noted. Pt is not diaphoretic.  Psychiatric: Normal mood and affect.  Nursing note and vitals reviewed.   ED Course  Procedures (including critical care time)   MDM   Final diagnoses:  Cough  Wheeze    1:48 PM Patient still having some wheezing (end expiratory).  Patient with URI symptoms and mild wheezing. Wheezing improved after 2 breathing treatments in the ER. Vital signs are stable. Will give cough medicine, azithromycin given length of symptoms and asthma history, prednisone, and inhaler. Recommend primary care follow-up. Patient is stable and ready for discharge.    Roxy Horseman, PA-C 07/20/15 1540  Blane Ohara, MD 07/20/15 [phone]

## 2015-07-20 NOTE — ED Notes (Signed)
Declined W/C at D/C and was escorted to lobby by RN. 

## 2015-07-20 NOTE — ED Notes (Signed)
Patient here with ongoing cough, congestion, reports that she is using the cough medication that she was prescribed last week and not feeling any better.

## 2015-07-20 NOTE — Discharge Instructions (Signed)
Upper Respiratory Infection, Adult Most upper respiratory infections (URIs) are a viral infection of the air passages leading to the lungs. A URI affects the nose, throat, and upper air passages. The most common type of URI is nasopharyngitis and is typically referred to as "the common cold." URIs run their course and usually go away on their own. Most of the time, a URI does not require medical attention, but sometimes a bacterial infection in the upper airways can follow a viral infection. This is called a secondary infection. Sinus and middle ear infections are common types of secondary upper respiratory infections. Bacterial pneumonia can also complicate a URI. A URI can worsen asthma and chronic obstructive pulmonary disease (COPD). Sometimes, these complications can require emergency medical care and may be life threatening.  CAUSES Almost all URIs are caused by viruses. A virus is a type of germ and can spread from one person to another.  RISKS FACTORS You may be at risk for a URI if:   You smoke.   You have chronic heart or lung disease.  You have a weakened defense (immune) system.   You are very young or very old.   You have nasal allergies or asthma.  You work in crowded or poorly ventilated areas.  You work in health care facilities or schools. SIGNS AND SYMPTOMS  Symptoms typically develop 2-3 days after you come in contact with a cold virus. Most viral URIs last 7-10 days. However, viral URIs from the influenza virus (flu virus) can last 14-18 days and are typically more severe. Symptoms may include:   Runny or stuffy (congested) nose.   Sneezing.   Cough.   Sore throat.   Headache.   Fatigue.   Fever.   Loss of appetite.   Pain in your forehead, behind your eyes, and over your cheekbones (sinus pain).  Muscle aches.  DIAGNOSIS  Your health care provider may diagnose a URI by:  Physical exam.  Tests to check that your symptoms are not due to  another condition such as:  Strep throat.  Sinusitis.  Pneumonia.  Asthma. TREATMENT  A URI goes away on its own with time. It cannot be cured with medicines, but medicines may be prescribed or recommended to relieve symptoms. Medicines may help:  Reduce your fever.  Reduce your cough.  Relieve nasal congestion. HOME CARE INSTRUCTIONS   Take medicines only as directed by your health care provider.   Gargle warm saltwater or take cough drops to comfort your throat as directed by your health care provider.  Use a warm mist humidifier or inhale steam from a shower to increase air moisture. This may make it easier to breathe.  Drink enough fluid to keep your urine clear or pale yellow.   Eat soups and other clear broths and maintain good nutrition.   Rest as needed.   Return to work when your temperature has returned to normal or as your health care provider advises. You may need to stay home longer to avoid infecting others. You can also use a face mask and careful hand washing to prevent spread of the virus.  Increase the usage of your inhaler if you have asthma.   Do not use any tobacco products, including cigarettes, chewing tobacco, or electronic cigarettes. If you need help quitting, ask your health care provider. PREVENTION  The best way to protect yourself from getting a cold is to practice good hygiene.   Avoid oral or hand contact with people with cold   symptoms.   Wash your hands often if contact occurs.  There is no clear evidence that vitamin C, vitamin E, echinacea, or exercise reduces the chance of developing a cold. However, it is always recommended to get plenty of rest, exercise, and practice good nutrition.  SEEK MEDICAL CARE IF:   You are getting worse rather than better.   Your symptoms are not controlled by medicine.   You have chills.  You have worsening shortness of breath.  You have brown or red mucus.  You have yellow or brown nasal  discharge.  You have pain in your face, especially when you bend forward.  You have a fever.  You have swollen neck glands.  You have pain while swallowing.  You have white areas in the back of your throat. SEEK IMMEDIATE MEDICAL CARE IF:   You have severe or persistent:  Headache.  Ear pain.  Sinus pain.  Chest pain.  You have chronic lung disease and any of the following:  Wheezing.  Prolonged cough.  Coughing up blood.  A change in your usual mucus.  You have a stiff neck.  You have changes in your:  Vision.  Hearing.  Thinking.  Mood. MAKE SURE YOU:   Understand these instructions.  Will watch your condition.  Will get help right away if you are not doing well or get worse.   This information is not intended to replace advice given to you by your health care provider. Make sure you discuss any questions you have with your health care provider.   Document Released: 12/07/2000 Document Revised: 10/28/2014 Document Reviewed: 09/18/2013 Elsevier Interactive Patient Education 2016 [employer]  

## 2015-08-24 ENCOUNTER — Encounter: Payer: Self-pay | Admitting: Internal Medicine

## 2015-08-28 ENCOUNTER — Emergency Department (HOSPITAL_COMMUNITY)
Admission: EM | Admit: 2015-08-28 | Discharge: 2015-08-28 | Disposition: A | Discharge status: Home / Self Care | Payer: 59 | Attending: Emergency Medicine | Admitting: Emergency Medicine

## 2015-08-28 ENCOUNTER — Encounter (HOSPITAL_COMMUNITY): Payer: Self-pay

## 2015-08-28 DIAGNOSIS — R109 Unspecified abdominal pain: Secondary | ICD-10-CM | POA: Diagnosis not present

## 2015-08-28 DIAGNOSIS — Z79899 Other long term (current) drug therapy: Secondary | ICD-10-CM | POA: Insufficient documentation

## 2015-08-28 DIAGNOSIS — Z87891 Personal history of nicotine dependence: Secondary | ICD-10-CM | POA: Insufficient documentation

## 2015-08-28 DIAGNOSIS — R1110 Vomiting, unspecified: Secondary | ICD-10-CM | POA: Diagnosis not present

## 2015-08-28 DIAGNOSIS — R197 Diarrhea, unspecified: Secondary | ICD-10-CM | POA: Diagnosis not present

## 2015-08-28 DIAGNOSIS — Z8719 Personal history of other diseases of the digestive system: Secondary | ICD-10-CM | POA: Diagnosis not present

## 2015-08-28 DIAGNOSIS — Z8742 Personal history of other diseases of the female genital tract: Secondary | ICD-10-CM | POA: Diagnosis not present

## 2015-08-28 DIAGNOSIS — J45909 Unspecified asthma, uncomplicated: Secondary | ICD-10-CM | POA: Insufficient documentation

## 2015-08-28 DIAGNOSIS — M549 Dorsalgia, unspecified: Secondary | ICD-10-CM | POA: Diagnosis not present

## 2015-08-28 DIAGNOSIS — Z3202 Encounter for pregnancy test, result negative: Secondary | ICD-10-CM | POA: Diagnosis not present

## 2015-08-28 DIAGNOSIS — Z87440 Personal history of urinary (tract) infections: Secondary | ICD-10-CM | POA: Diagnosis not present

## 2015-08-28 LAB — URINALYSIS, ROUTINE W REFLEX MICROSCOPIC
Bilirubin Urine: NEGATIVE
Glucose, UA: NEGATIVE mg/dL
Hgb urine dipstick: NEGATIVE
Ketones, ur: NEGATIVE mg/dL
Leukocytes, UA: NEGATIVE
Nitrite: NEGATIVE
Protein, ur: NEGATIVE mg/dL
Specific Gravity, Urine: 1.022 (ref 1.005–1.030)
pH: 7.5 (ref 5.0–8.0)

## 2015-08-28 LAB — POC URINE PREG, ED: Preg Test, Ur: NEGATIVE

## 2015-08-28 MED ORDER — ONDANSETRON 4 MG PO TBDP
4.0000 mg | ORAL_TABLET | Freq: Once | ORAL | Status: AC
  Administered 2015-08-28: 4 mg via ORAL
  Filled 2015-08-28: qty 1

## 2015-08-28 MED ORDER — ONDANSETRON 8 MG PO TBDP: 8.0000 mg | ORAL_TABLET | Freq: Three times a day (TID) | ORAL | Status: DC | PRN

## 2015-08-28 MED ORDER — ACETAMINOPHEN 325 MG PO TABS
650.0000 mg | ORAL_TABLET | Freq: Once | ORAL | Status: AC
  Administered 2015-08-28: 650 mg via ORAL
  Filled 2015-08-28: qty 2

## 2015-08-28 NOTE — Discharge Instructions (Signed)

## 2015-08-28 NOTE — ED Notes (Signed)
[facility], woke up n/v/d about one hour ago, with abd pain, denies fever.

## 2015-08-28 NOTE — ED Provider Notes (Signed)
CSN: [id number]     Arrival date & time 08/28/15  0344 History   First MD Initiated Contact with Patient 08/28/15 [id number]     Chief Complaint  Patient presents with  . Emesis  . Diarrhea    Patient is a 25 y.o. female presenting with vomiting and diarrhea. The history is provided by the patient.  Emesis Severity:  Moderate Timing:  Intermittent Progression:  Unchanged Chronicity:  New Relieved by:  None tried Worsened by:  Nothing tried Associated symptoms: abdominal pain, cough and diarrhea   Associated symptoms: no fever   Risk factors: no sick contacts and no travel to endemic areas   Diarrhea Associated symptoms: abdominal pain, cough and vomiting   Associated symptoms: no fever     Past Medical History  Diagnosis Date  . UTI (lower urinary tract infection)   . Asthma   . Gastritis   . Ovarian cyst   . Gastritis    Past Surgical History  Procedure Laterality Date  . No past surgeries     Family History  Problem Relation Age of Onset  . Hyperlipidemia Other   . Hypertension Other   . Arthritis Other   . Cancer Other     breast cancer  . Hypertension Other   . Asthma Other   . Diabetes Other    Social History  Substance Use Topics  . Smoking status: Former Smoker -- 0.10 packs/day    Types: Cigarettes  . Smokeless tobacco: Never Used  . Alcohol Use: Yes     Comment: "Not often"   OB History    Gravida Para Term Preterm AB TAB SAB Ectopic Multiple Living   0              Review of Systems  Constitutional: Negative for fever.  Respiratory: Positive for cough.   Gastrointestinal: Positive for vomiting, abdominal pain and diarrhea. Negative for blood in stool.  Musculoskeletal: Positive for back pain.  All other systems reviewed and are negative.     Allergies  Review of patient's allergies indicates no known allergies.  Home Medications   Prior to Admission medications   Medication Sig Start Date End Date Taking? Authorizing Provider  albuterol  (PROVENTIL HFA;VENTOLIN HFA) 108 (90 Base) MCG/ACT inhaler Inhale 2 puffs into the lungs every 4 (four) hours as needed for wheezing or shortness of breath. 07/20/15   Roxy Horseman, PA-C  diphenhydrAMINE (BENADRYL) 25 mg capsule Take 25 mg by mouth every 6 (six) hours as needed for sleep.    Historical Provider, MD  HYDROcodone-homatropine (HYCODAN) 5-1.5 MG/5ML syrup Take 5 mLs by mouth every 6 (six) hours as needed for cough. 07/20/15   Roxy Horseman, PA-C  ibuprofen (ADVIL,MOTRIN) 200 MG tablet Take 400-600 mg by mouth every 6 (six) hours as needed for headache or mild pain.    Historical Provider, MD  ondansetron (ZOFRAN ODT) 8 MG disintegrating tablet Take 1 tablet (8 mg total) by mouth every 8 (eight) hours as needed for vomiting. 08/28/15   Zadie Rhine, MD   BP 131/93 mmHg  Pulse 91  Temp(Src) 98.3 F (36.8 C) (Oral)  Resp 14  Ht  (1.499 m)  Wt 66.679 kg  BMI 29.67 kg/m2  SpO2 95%  LMP 08/03/2015 Physical Exam CONSTITUTIONAL: Well developed/well nourished HEAD: Normocephalic/atraumatic EYES: EOMI/PERRL, no icterus ENMT: Mucous membranes moist NECK: supple no meningeal signs SPINE/BACK:entire spine nontender CV: S1/S2 noted, no murmurs/rubs/gallops noted LUNGS: Lungs are clear to auscultation bilaterally, no apparent distress ABDOMEN: soft,  nontender, no rebound or guarding, bowel sounds noted throughout abdomen NEURO: Pt is awake/alert/appropriate, moves all extremitiesx4.  No facial droop.   EXTREMITIES: pulses normal/equal, full ROM SKIN: warm, color normal PSYCH: no abnormalities of mood noted, alert and oriented to situation  ED Course  Procedures   Medications  ondansetron (ZOFRAN-ODT) disintegrating tablet 4 mg (4 mg Oral Given 08/28/15 0420)  acetaminophen (TYLENOL) tablet 650 mg (650 mg Oral Given 08/28/15 0452)    Labs Review Labs Reviewed  URINALYSIS, ROUTINE W REFLEX MICROSCOPIC ([facility])  POC URINE PREG, ED    I have personally reviewed and  evaluated these lab results as part of my medical decision-making.  5:16 AM Pt improved Taking PO No vomiting She denied bloody vomit/stool Discussed return precautions No focal RLQ tenderness on exam I told her to come back if she has any RLQ pain over next 8 hours   MDM   Final diagnoses:  Vomiting and diarrhea    Nursing notes including past medical history and social history reviewed and considered in documentation Labs/vital reviewed myself and considered during evaluation     Zadie Rhineonald Cambrey Lupi, MD 08/28/15 [phone]

## 2015-09-26 ENCOUNTER — Emergency Department (HOSPITAL_COMMUNITY): Payer: 59

## 2015-09-26 ENCOUNTER — Emergency Department (HOSPITAL_COMMUNITY)
Admission: EM | Admit: 2015-09-26 | Discharge: 2015-09-27 | Disposition: A | Discharge status: Home / Self Care | Payer: 59 | Attending: Emergency Medicine | Admitting: Emergency Medicine

## 2015-09-26 ENCOUNTER — Encounter (HOSPITAL_COMMUNITY): Payer: Self-pay | Admitting: *Deleted

## 2015-09-26 DIAGNOSIS — Z8719 Personal history of other diseases of the digestive system: Secondary | ICD-10-CM | POA: Insufficient documentation

## 2015-09-26 DIAGNOSIS — Z8742 Personal history of other diseases of the female genital tract: Secondary | ICD-10-CM | POA: Insufficient documentation

## 2015-09-26 DIAGNOSIS — Z87440 Personal history of urinary (tract) infections: Secondary | ICD-10-CM | POA: Diagnosis not present

## 2015-09-26 DIAGNOSIS — Z79899 Other long term (current) drug therapy: Secondary | ICD-10-CM | POA: Insufficient documentation

## 2015-09-26 DIAGNOSIS — J45901 Unspecified asthma with (acute) exacerbation: Secondary | ICD-10-CM | POA: Insufficient documentation

## 2015-09-26 DIAGNOSIS — R0602 Shortness of breath: Secondary | ICD-10-CM | POA: Diagnosis present

## 2015-09-26 MED ORDER — ALBUTEROL SULFATE (2.5 MG/3ML) 0.083% IN NEBU
5.0000 mg | INHALATION_SOLUTION | Freq: Once | RESPIRATORY_TRACT | Status: AC
  Administered 2015-09-26: 5 mg via RESPIRATORY_TRACT
  Filled 2015-09-26: qty 6

## 2015-09-26 MED ORDER — PREDNISONE 20 MG PO TABS: 60.0000 mg | ORAL_TABLET | Freq: Every day | ORAL | Status: AC

## 2015-09-26 MED ORDER — ALBUTEROL SULFATE HFA 108 (90 BASE) MCG/ACT IN AERS: 2.0000 | INHALATION_SPRAY | RESPIRATORY_TRACT | Status: DC | PRN

## 2015-09-26 MED ORDER — ALBUTEROL SULFATE (2.5 MG/3ML) 0.083% IN NEBU
INHALATION_SOLUTION | RESPIRATORY_TRACT | Status: AC
  Administered 2015-09-26: 5 mg
  Filled 2015-09-26: qty 6

## 2015-09-26 MED ORDER — PREDNISONE 20 MG PO TABS
60.0000 mg | ORAL_TABLET | Freq: Once | ORAL | Status: AC
  Administered 2015-09-26: 60 mg via ORAL
  Filled 2015-09-26: qty 3

## 2015-09-26 MED ORDER — ALBUTEROL SULFATE HFA 108 (90 BASE) MCG/ACT IN AERS
2.0000 | INHALATION_SPRAY | Freq: Once | RESPIRATORY_TRACT | Status: AC
  Administered 2015-09-26: 2 via RESPIRATORY_TRACT
  Filled 2015-09-26: qty 6.7

## 2015-09-26 NOTE — ED Provider Notes (Signed)
CSN: [id number]     Arrival date & time 09/26/15  2022 History   First MD Initiated Contact with Patient 09/26/15 2109     Chief Complaint  Patient presents with  . Shortness of Breath  . Asthma     (Consider location/radiation/quality/duration/timing/severity/associated sxs/prior Treatment) Patient is a 25 y.o. female presenting with shortness of breath. The history is provided by the patient.  Shortness of Breath Severity:  Severe Onset quality:  Gradual Duration:  3 hours Timing:  Constant Progression:  Worsening Chronicity:  New Context comment:  Out oc home medications Relieved by:  Nothing Worsened by:  Deep breathing Ineffective treatments:  None tried Associated symptoms: cough and wheezing   Associated symptoms: no abdominal pain, no chest pain, no diaphoresis, no fever, no headaches, no rash, no sore throat, no sputum production, no syncope and no vomiting   Risk factors: no hx of PE/DVT     Past Medical History  Diagnosis Date  . UTI (lower urinary tract infection)   . Asthma   . Gastritis   . Ovarian cyst   . Gastritis    Past Surgical History  Procedure Laterality Date  . No past surgeries     Family History  Problem Relation Age of Onset  . Hyperlipidemia Other   . Hypertension Other   . Arthritis Other   . Cancer Other     breast cancer  . Hypertension Other   . Asthma Other   . Diabetes Other    Social History  Substance Use Topics  . Smoking status: Former Smoker -- 0.10 packs/day    Types: Cigarettes  . Smokeless tobacco: Never Used  . Alcohol Use: Yes     Comment: "Not often"   OB History    Gravida Para Term Preterm AB TAB SAB Ectopic Multiple Living   0              Review of Systems  Constitutional: Negative for fever, chills, diaphoresis, activity change, appetite change and fatigue.  HENT: Negative for facial swelling, rhinorrhea, sore throat, trouble swallowing and voice change.   Eyes: Negative for photophobia, pain and  visual disturbance.  Respiratory: Positive for cough, shortness of breath and wheezing. Negative for sputum production and stridor.   Cardiovascular: Negative for chest pain, palpitations, leg swelling and syncope.  Gastrointestinal: Negative for nausea, vomiting, abdominal pain, constipation and anal bleeding.  Endocrine: Negative.   Genitourinary: Negative for dysuria, vaginal bleeding, vaginal discharge and vaginal pain.  Musculoskeletal: Negative for myalgias, back pain and arthralgias.  Skin: Negative.  Negative for rash.  Allergic/Immunologic: Negative.   Neurological: Negative for dizziness, tremors, syncope, weakness and headaches.  Psychiatric/Behavioral: Negative for suicidal ideas, sleep disturbance and self-injury.  All other systems reviewed and are negative.     Allergies  Review of patient's allergies indicates no known allergies.  Home Medications   Prior to Admission medications   Medication Sig Start Date End Date Taking? Authorizing Provider  acetaminophen (TYLENOL) 500 MG tablet Take 500 mg by mouth every 6 (six) hours as needed for moderate pain.   Yes Historical Provider, MD  albuterol (PROVENTIL HFA;VENTOLIN HFA) 108 (90 Base) MCG/ACT inhaler Inhale 2 puffs into the lungs every 4 (four) hours as needed for wheezing or shortness of breath. 07/20/15   Roxy Horseman, PA-C  albuterol (PROVENTIL HFA;VENTOLIN HFA) 108 (90 Base) MCG/ACT inhaler Inhale 2 puffs into the lungs every 4 (four) hours as needed for wheezing or shortness of breath. 09/26/15   Kathlene November  Festus Aloe, MD  HYDROcodone-homatropine [facility]) 5-1.5 MG/5ML syrup Take 5 mLs by mouth every 6 (six) hours as needed for cough. 07/20/15   Roxy Horseman, PA-C  ondansetron (ZOFRAN ODT) 8 MG disintegrating tablet Take 1 tablet (8 mg total) by mouth every 8 (eight) hours as needed for vomiting. 08/28/15   Zadie Rhine, MD  predniSONE (DELTASONE) 20 MG tablet Take 3 tablets (60 mg total) by mouth daily. 09/27/15 09/30/15  Lula Olszewski, MD   BP 127/74 mmHg  Pulse 98  Temp(Src) 98.6 F (37 C) (Oral)  Resp 20  SpO2 98%  LMP 08/03/2015 Physical Exam  Constitutional: She is oriented to person, place, and time. She appears well-developed and well-nourished. No distress.  HENT:  Head: Normocephalic and atraumatic.  Right Ear: External ear normal.  Left Ear: External ear normal.  Mouth/Throat: No oropharyngeal exudate.  Eyes: Conjunctivae and EOM are normal. Pupils are equal, round, and reactive to light. No scleral icterus.  Neck: Normal range of motion. Neck supple. No JVD present. No tracheal deviation present. No thyromegaly present.  Cardiovascular: Normal rate, regular rhythm and intact distal pulses.  Exam reveals no gallop and no friction rub.   No murmur heard. Pulmonary/Chest: She is in respiratory distress (mild respiratory distress). She has wheezes (signficant wheezing diffusely in all lung fields). She has no rales. She exhibits no tenderness.  Abdominal: Soft. Bowel sounds are normal. She exhibits no distension. There is no tenderness.  Musculoskeletal: Normal range of motion. She exhibits no edema or tenderness.  Neurological: She is alert and oriented to person, place, and time. No cranial nerve deficit. She exhibits normal muscle tone. Coordination normal.  5/5 strength in all 4 extremities. Normal Gait.   Skin: Skin is warm and dry. She is not diaphoretic. No pallor.  Psychiatric: She has a normal mood and affect. She expresses no homicidal and no suicidal ideation. She expresses no suicidal plans and no homicidal plans.  Nursing note and vitals reviewed.   ED Course  Procedures (including critical care time) Labs Review Labs Reviewed - No data to display  Imaging Review Dg Chest Portable 1 View  09/26/2015  CLINICAL DATA:  SOB tonight, nonsmoker.  Hx asthma EXAM: PORTABLE CHEST 1 VIEW COMPARISON:  07/14/2015 FINDINGS: Numerous leads and wires project over the chest. Midline trachea.  Normal  heart size and mediastinal contours. Sharp costophrenic angles.  No pneumothorax.  Clear lungs. IMPRESSION: No active disease. Electronically Signed   By: Jeronimo Greaves M.D.   On: 09/26/2015 21:59   I have personally reviewed and evaluated these images and lab results as part of my medical decision-making.   EKG Interpretation None      MDM   Final diagnoses:  Asthma attack    The patient is a 25 year old female with a history of asthma who presents with 3 hours of wheezing and shortness of breath. Of note patient is out of her home albuterol prescription. She denies any fevers or productive cough. Patient is afebrile and hemolytically stable. Chest x-ray shows no acute cardiopulmonary pathology. Patient is given steroid and breathing treatment with complete resolution of wheezing. I've considered pneumonia, pulmonary embolus and  foreign body but feel these are unlikely based on patient's history physical exam and response to treatment.  I feel the patient is appropriate for outpatient treatment with prednisone burst and refill of her albuterol. Patient expresses understanding and agreement with this plan and is discharged home standard ED return precautions.  Patient seen with attending, Dr. Rosalia Hammers,  who oversaw clinical decision making.     Lula OlszewskiMike Manasvi Dickard, MD 09/26/15 2300  Margarita Grizzleanielle Ray, MD 09/27/15 0000

## 2015-09-26 NOTE — Discharge Instructions (Signed)
Asthma, Adult Asthma is a condition of the lungs in which the airways tighten and narrow. Asthma can make it hard to breathe. Asthma cannot be cured, but medicine and lifestyle changes can help control it. Asthma may be started (triggered) by:  Animal skin flakes (dander).  Dust.  Cockroaches.  Pollen.  Mold.  Smoke.  Cleaning products.  Hair sprays or aerosol sprays.  Paint fumes or strong smells.  Cold air, weather changes, and winds.  Crying or laughing hard.  Stress.  Certain medicines or drugs.  Foods, such as dried fruit, potato chips, and sparkling grape juice.  Infections or conditions (colds, flu).  Exercise.  Certain medical conditions or diseases.  Exercise or tiring activities. HOME CARE   Take medicine as told by your doctor.  Use a peak flow meter as told by your doctor. A peak flow meter is a tool that measures how well the lungs are working.  Record and keep track of the peak flow meter's readings.  Understand and use the asthma action plan. An asthma action plan is a written plan for taking care of your asthma and treating your attacks.  To help prevent asthma attacks:  Do not smoke. Stay away from secondhand smoke.  Change your heating and air conditioning filter often.  Limit your use of fireplaces and wood stoves.  Get rid of pests (such as roaches and mice) and their droppings.  Throw away plants if you see mold on them.  Clean your floors. Dust regularly. Use cleaning products that do not smell.  Have someone vacuum when you are not home. Use a vacuum cleaner with a HEPA filter if possible.  Replace carpet with wood, tile, or vinyl flooring. Carpet can trap animal skin flakes and dust.  Use allergy-proof pillows, mattress covers, and box spring covers.  Wash bed sheets and blankets every week in hot water and dry them in a dryer.  Use blankets that are made of polyester or cotton.  Clean bathrooms and kitchens with bleach.  If possible, have someone repaint the walls in these rooms with mold-resistant paint. Keep out of the rooms that are being cleaned and painted.  Wash hands often. GET HELP IF:  You have make a whistling sound when breaking (wheeze), have shortness of breath, or have a cough even if taking medicine to prevent attacks.  The colored mucus you cough up (sputum) is thicker than usual.  The colored mucus you cough up changes from clear or white to yellow, green, gray, or bloody.  You have problems from the medicine you are taking such as:  A rash.  Itching.  Swelling.  Trouble breathing.  You need reliever medicines more than 2-3 times a week.  Your peak flow measurement is still at 50-79% of your personal best after following the action plan for 1 hour.  You have a fever. GET HELP RIGHT AWAY IF:   You seem to be worse and are not responding to medicine during an asthma attack.  You are short of breath even at rest.  You get short of breath when doing very little activity.  You have trouble eating, drinking, or talking.  You have chest pain.  You have a fast heartbeat.  Your lips or fingernails start to turn blue.  You are light-headed, dizzy, or faint.  Your peak flow is less than 50% of your personal best.   This information is not intended to replace advice given to you by your health care provider. Make sure   you discuss any questions you have with your health care provider.   Document Released: 11/30/2007 Document Revised: 03/04/2015 Document Reviewed: 01/10/2013 Elsevier Interactive Patient Education 2016 [employer] Inc.  

## 2015-09-26 NOTE — ED Notes (Signed)
To ED for eval of SOB. Pt ran out of her inhaler a few days ago. Audible wheezing noted at triage.

## 2015-09-26 NOTE — ED Notes (Signed)
Reported wheezing to dr. Rosalia Hammersay, and already received albuterol in triage still wheezing. md acknowledges, orders repeat albuterol and to give prednisone, allows to have xray changed to portable.

## 2015-09-26 NOTE — ED Notes (Signed)
Patient verbalized understanding of discharge instructions and denies any further needs or questions at this time. VS stable. Patient ambulatory with steady gait.  

## 2015-10-26 ENCOUNTER — Encounter (HOSPITAL_COMMUNITY): Payer: Self-pay | Admitting: Emergency Medicine

## 2015-10-26 ENCOUNTER — Emergency Department (HOSPITAL_COMMUNITY)
Admission: EM | Admit: 2015-10-26 | Discharge: 2015-10-26 | Disposition: A | Discharge status: Home / Self Care | Payer: 59 | Attending: Emergency Medicine | Admitting: Emergency Medicine

## 2015-10-26 DIAGNOSIS — R1013 Epigastric pain: Secondary | ICD-10-CM | POA: Insufficient documentation

## 2015-10-26 DIAGNOSIS — R1012 Left upper quadrant pain: Secondary | ICD-10-CM | POA: Diagnosis present

## 2015-10-26 DIAGNOSIS — Z8742 Personal history of other diseases of the female genital tract: Secondary | ICD-10-CM | POA: Diagnosis not present

## 2015-10-26 DIAGNOSIS — J45909 Unspecified asthma, uncomplicated: Secondary | ICD-10-CM | POA: Diagnosis not present

## 2015-10-26 DIAGNOSIS — Z87440 Personal history of urinary (tract) infections: Secondary | ICD-10-CM | POA: Insufficient documentation

## 2015-10-26 DIAGNOSIS — Z3202 Encounter for pregnancy test, result negative: Secondary | ICD-10-CM | POA: Insufficient documentation

## 2015-10-26 DIAGNOSIS — Z87891 Personal history of nicotine dependence: Secondary | ICD-10-CM | POA: Diagnosis not present

## 2015-10-26 DIAGNOSIS — R110 Nausea: Secondary | ICD-10-CM

## 2015-10-26 DIAGNOSIS — K0889 Other specified disorders of teeth and supporting structures: Secondary | ICD-10-CM | POA: Diagnosis not present

## 2015-10-26 DIAGNOSIS — Z79899 Other long term (current) drug therapy: Secondary | ICD-10-CM | POA: Diagnosis not present

## 2015-10-26 LAB — URINALYSIS, ROUTINE W REFLEX MICROSCOPIC
Bilirubin Urine: NEGATIVE
Glucose, UA: NEGATIVE mg/dL
Ketones, ur: NEGATIVE mg/dL
Leukocytes, UA: NEGATIVE
Nitrite: NEGATIVE
Protein, ur: NEGATIVE mg/dL
Specific Gravity, Urine: 1.019 (ref 1.005–1.030)
pH: 8.5 — ABNORMAL HIGH (ref 5.0–8.0)

## 2015-10-26 LAB — CBC
HCT: 35.6 % — ABNORMAL LOW (ref 36.0–46.0)
Hemoglobin: 11.3 g/dL — ABNORMAL LOW (ref 12.0–15.0)
MCH: 23.6 pg — ABNORMAL LOW (ref 26.0–34.0)
MCHC: 31.7 g/dL (ref 30.0–36.0)
MCV: 74.3 fL — ABNORMAL LOW (ref 78.0–100.0)
Platelets: 398 10*3/uL (ref 150–400)
RBC: 4.79 MIL/uL (ref 3.87–5.11)
RDW: 15.2 % (ref 11.5–15.5)
WBC: 4.2 10*3/uL (ref 4.0–10.5)

## 2015-10-26 LAB — URINE MICROSCOPIC-ADD ON

## 2015-10-26 LAB — HEPATIC FUNCTION PANEL
ALT: 12 U/L — ABNORMAL LOW (ref 14–54)
AST: 20 U/L (ref 15–41)
Albumin: 3.8 g/dL (ref 3.5–5.0)
Alkaline Phosphatase: 54 U/L (ref 38–126)
Bilirubin, Direct: <0.1 mg/dL — ABNORMAL LOW (ref 0.1–0.5)
Total Bilirubin: 0.5 mg/dL (ref 0.3–1.2)
Total Protein: 7.7 g/dL (ref 6.5–8.1)

## 2015-10-26 LAB — I-STAT CHEM 8, ED
BUN: 7 mg/dL (ref 6–20)
Calcium, Ion: 1.11 mmol/L — ABNORMAL LOW (ref 1.12–1.23)
Chloride: 106 mmol/L (ref 101–111)
Creatinine, Ser: 0.7 mg/dL (ref 0.44–1.00)
Glucose, Bld: 96 mg/dL (ref 65–99)
HCT: 39 % (ref 36.0–46.0)
Hemoglobin: 13.3 g/dL (ref 12.0–15.0)
Potassium: 4 mmol/L (ref 3.5–5.1)
Sodium: 141 mmol/L (ref 135–145)
TCO2: 20 mmol/L (ref 0–100)

## 2015-10-26 LAB — LIPASE, BLOOD: Lipase: 41 U/L (ref 11–51)

## 2015-10-26 LAB — I-STAT BETA HCG BLOOD, ED (MC, WL, AP ONLY): I-stat hCG, quantitative: <5 m[IU]/mL (ref <5)

## 2015-10-26 MED ORDER — GI COCKTAIL ~~LOC~~
30.0000 mL | Freq: Once | ORAL | Status: AC
  Administered 2015-10-26: 30 mL via ORAL
  Filled 2015-10-26: qty 30

## 2015-10-26 MED ORDER — PENICILLIN V POTASSIUM 500 MG PO TABS: 500.0000 mg | ORAL_TABLET | Freq: Four times a day (QID) | ORAL | Status: AC

## 2015-10-26 MED ORDER — ONDANSETRON 4 MG PO TBDP
4.0000 mg | ORAL_TABLET | Freq: Once | ORAL | Status: AC
  Administered 2015-10-26: 4 mg via ORAL
  Filled 2015-10-26: qty 1

## 2015-10-26 MED ORDER — ONDANSETRON 4 MG PO TBDP: 4.0000 mg | ORAL_TABLET | Freq: Three times a day (TID) | ORAL | Status: DC | PRN

## 2015-10-26 MED ORDER — PANTOPRAZOLE SODIUM 40 MG PO TBEC
40.0000 mg | DELAYED_RELEASE_TABLET | Freq: Once | ORAL | Status: AC
  Administered 2015-10-26: 40 mg via ORAL
  Filled 2015-10-26: qty 1

## 2015-10-26 MED ORDER — OMEPRAZOLE 20 MG PO CPDR: 20.0000 mg | DELAYED_RELEASE_CAPSULE | Freq: Every day | ORAL | Status: DC

## 2015-10-26 NOTE — ED Notes (Signed)
Pt woke up this am with c/o upper left quadrant abd pain with some nausea and diarrhea . All started around 8 am

## 2015-10-26 NOTE — ED Provider Notes (Signed)
CSN: [id number]     Arrival date & time 10/26/15  [id number] History   First MD Initiated Contact with Patient 10/26/15 1002     Chief Complaint  Patient presents with  . Abdominal Pain  . Nausea     (Consider location/radiation/quality/duration/timing/severity/associated sxs/prior Treatment) HPI Comments: Patient presents today with a chief complaint of abdominal pain.  She reports that the pain is located in the epigastric and LUQ.  Pain has been constant since this morning.  Nothing makes the pain better or worse.  She has taken Ibuprofen for the pain without improvement.  She reports a history of Gastritis and states that this pain feels similar.  She states that she has been taking 2-3 pills of Ibuprofen three times a day for the past couple of months for dental pain.  She also reports drinking three shots of liquor yesterday.  She reports associated nausea, but denies vomiting.  No fever or chills.  No cough or chest pain.  Denies diarrhea or constipation.    Patient reports that the dental pain has been present for the past couple of months.  Pain located left lower teeth.  No acute dental injury.  No difficulty swallowing.  No facial swelling.  She states that she does not have a Education officer, communitydentist.     Past Medical History  Diagnosis Date  . UTI (lower urinary tract infection)   . Asthma   . Gastritis   . Ovarian cyst   . Gastritis    Past Surgical History  Procedure Laterality Date  . No past surgeries     Family History  Problem Relation Age of Onset  . Hyperlipidemia Other   . Hypertension Other   . Arthritis Other   . Cancer Other     breast cancer  . Hypertension Other   . Asthma Other   . Diabetes Other    Social History  Substance Use Topics  . Smoking status: Former Smoker -- 0.10 packs/day    Types: Cigarettes  . Smokeless tobacco: Never Used  . Alcohol Use: Yes     Comment: "Not often"   OB History    Gravida Para Term Preterm AB TAB SAB Ectopic Multiple Living   0               Review of Systems  All other systems reviewed and are negative.     Allergies  Review of patient's allergies indicates no known allergies.  Home Medications   Prior to Admission medications   Medication Sig Start Date End Date Taking? Authorizing Provider  acetaminophen (TYLENOL) 500 MG tablet Take 500 mg by mouth every 6 (six) hours as needed for moderate pain.    Historical Provider, MD  albuterol (PROVENTIL HFA;VENTOLIN HFA) 108 (90 Base) MCG/ACT inhaler Inhale 2 puffs into the lungs every 4 (four) hours as needed for wheezing or shortness of breath. 07/20/15   Roxy Horsemanobert Browning, PA-C  albuterol (PROVENTIL HFA;VENTOLIN HFA) 108 (90 Base) MCG/ACT inhaler Inhale 2 puffs into the lungs every 4 (four) hours as needed for wheezing or shortness of breath. 09/26/15   Lula OlszewskiMike Goebel, MD  HYDROcodone-homatropine [facility]HYCODAN) 5-1.5 MG/5ML syrup Take 5 mLs by mouth every 6 (six) hours as needed for cough. 07/20/15   Roxy Horsemanobert Browning, PA-C  ondansetron (ZOFRAN ODT) 8 MG disintegrating tablet Take 1 tablet (8 mg total) by mouth every 8 (eight) hours as needed for vomiting. 08/28/15   Zadie Rhineonald Wickline, MD   BP 131/101 mmHg  Pulse 73  Temp(Src) 98.4  F (36.9 C) (Oral)  Resp 18  Ht  (1.549 m)  Wt 68.04 kg  BMI 28.36 kg/m2  SpO2 100%  LMP 09/28/2015 (Approximate) Physical Exam  Constitutional: She appears well-developed and well-nourished.  HENT:  Head: Normocephalic and atraumatic.  Mouth/Throat: Oropharynx is clear and moist. No trismus in the jaw.  Tenderness to palpation of the left lower gingiva.  No obvious abscess visualized or palpated.  No sublingual or submental swelling.  No sublingual swelling or tenderness.  Neck: Normal range of motion. Neck supple.  Cardiovascular: Normal rate, regular rhythm and normal heart sounds.   Pulmonary/Chest: Effort normal and breath sounds normal.  Abdominal: Soft. Bowel sounds are normal. She exhibits no distension and no mass. There is  tenderness in the epigastric area and left upper quadrant. There is no rebound and no guarding.  Musculoskeletal: Normal range of motion.  Neurological: She is alert.  Skin: Skin is warm and dry.  Psychiatric: She has a normal mood and affect.  Nursing note and vitals reviewed.   ED Course  Procedures (including critical care time) Labs Review Labs Reviewed  CBC - Abnormal; Notable for the following:    Hemoglobin 11.3 (*)    HCT 35.6 (*)    MCV 74.3 (*)    MCH 23.6 (*)    All other components within normal limits  I-STAT CHEM 8, ED - Abnormal; Notable for the following:    Calcium, Ion 1.11 (*)    All other components within normal limits  LIPASE, BLOOD  URINALYSIS, ROUTINE W REFLEX MICROSCOPIC (NOT AT [facility])    Imaging Review No results found. I have personally reviewed and evaluated these images and lab results as part of my medical decision-making.   EKG Interpretation None     11:35 AM Reassessed patient.  Pain improved at this time.  Abdomen soft with mild tenderness to palpation of the LUQ. MDM   Final diagnoses:  None   Patient with a history of Gastritis presents today with Epigastric and LUQ abdominal pain.  She states that she has been using Ibuprofen TID for the past couple of months for dental pain.  She also reports drinking liquor yesterday.  Suspect that the pain is secondary to Gastritis or possible PUD.  Pain improved in the ED after given GI cocktail and Protonix.   Patient also with dental pain.  No gross abscess.  Exam unconcerning for Ludwig's angina or spread of infection.  Will treat with penicillin and given referral to Dentist.  Feel that the patient is stable for discharge.  Return precautions given.     Santiago Glad, PA-C 10/26/15 1514  Linwood Dibbles, MD 10/28/15 [phone]

## 2015-10-26 NOTE — Discharge Instructions (Signed)
Do not take Ibuprofen for your dental pain.  Instead take Tylenol.

## 2015-11-12 DIAGNOSIS — M549 Dorsalgia, unspecified: Secondary | ICD-10-CM | POA: Insufficient documentation

## 2015-11-12 DIAGNOSIS — Z87891 Personal history of nicotine dependence: Secondary | ICD-10-CM | POA: Insufficient documentation

## 2015-11-12 DIAGNOSIS — J45901 Unspecified asthma with (acute) exacerbation: Secondary | ICD-10-CM | POA: Insufficient documentation

## 2015-11-12 DIAGNOSIS — Z79899 Other long term (current) drug therapy: Secondary | ICD-10-CM | POA: Insufficient documentation

## 2015-11-12 DIAGNOSIS — Z8742 Personal history of other diseases of the female genital tract: Secondary | ICD-10-CM | POA: Insufficient documentation

## 2015-11-12 DIAGNOSIS — Z8719 Personal history of other diseases of the digestive system: Secondary | ICD-10-CM | POA: Diagnosis not present

## 2015-11-12 DIAGNOSIS — Z87440 Personal history of urinary (tract) infections: Secondary | ICD-10-CM | POA: Insufficient documentation

## 2015-11-12 DIAGNOSIS — R0602 Shortness of breath: Secondary | ICD-10-CM | POA: Diagnosis present

## 2015-11-12 DIAGNOSIS — R109 Unspecified abdominal pain: Secondary | ICD-10-CM | POA: Diagnosis not present

## 2015-11-13 ENCOUNTER — Encounter (HOSPITAL_COMMUNITY): Payer: Self-pay | Admitting: Emergency Medicine

## 2015-11-13 ENCOUNTER — Emergency Department (HOSPITAL_COMMUNITY)
Admission: EM | Admit: 2015-11-13 | Discharge: 2015-11-13 | Disposition: A | Discharge status: Home / Self Care | Payer: 59 | Attending: Emergency Medicine | Admitting: Emergency Medicine

## 2015-11-13 DIAGNOSIS — J45901 Unspecified asthma with (acute) exacerbation: Secondary | ICD-10-CM

## 2015-11-13 MED ORDER — ALBUTEROL SULFATE (2.5 MG/3ML) 0.083% IN NEBU
INHALATION_SOLUTION | RESPIRATORY_TRACT | Status: AC
  Filled 2015-11-13: qty 6

## 2015-11-13 MED ORDER — ALBUTEROL SULFATE HFA 108 (90 BASE) MCG/ACT IN AERS: 2.0000 | INHALATION_SPRAY | RESPIRATORY_TRACT | Status: DC | PRN

## 2015-11-13 MED ORDER — ALBUTEROL SULFATE (2.5 MG/3ML) 0.083% IN NEBU
5.0000 mg | INHALATION_SOLUTION | Freq: Once | RESPIRATORY_TRACT | Status: AC
Start: 2015-11-13 — End: 2015-11-13
  Administered 2015-11-13: 5 mg via RESPIRATORY_TRACT
  Filled 2015-11-13: qty 6

## 2015-11-13 MED ORDER — PREDNISONE 50 MG PO TABS: ORAL_TABLET | ORAL | Status: DC

## 2015-11-13 MED ORDER — ALBUTEROL SULFATE (2.5 MG/3ML) 0.083% IN NEBU
5.0000 mg | INHALATION_SOLUTION | Freq: Once | RESPIRATORY_TRACT | Status: AC
Start: 2015-11-13 — End: 2015-11-13
  Administered 2015-11-13: 5 mg via RESPIRATORY_TRACT

## 2015-11-13 MED ORDER — IPRATROPIUM BROMIDE 0.02 % IN SOLN
0.5000 mg | Freq: Once | RESPIRATORY_TRACT | Status: AC
  Administered 2015-11-13: 0.5 mg via RESPIRATORY_TRACT
  Filled 2015-11-13: qty 2.5

## 2015-11-13 MED ORDER — PREDNISONE 20 MG PO TABS
60.0000 mg | ORAL_TABLET | Freq: Once | ORAL | Status: AC
  Administered 2015-11-13: 60 mg via ORAL
  Filled 2015-11-13: qty 3

## 2015-11-13 MED ORDER — ALBUTEROL SULFATE (2.5 MG/3ML) 0.083% IN NEBU
5.0000 mg | INHALATION_SOLUTION | Freq: Once | RESPIRATORY_TRACT | Status: AC
  Administered 2015-11-13: 5 mg via RESPIRATORY_TRACT
  Filled 2015-11-13: qty 6

## 2015-11-13 NOTE — Discharge Instructions (Signed)

## 2015-11-13 NOTE — ED Notes (Signed)
Pt verbalized understanding of d/c instructions and has no further questions. Pt denies sob and is breath sounds clear upon d/c. Pt ambulatory and NAD.

## 2015-11-13 NOTE — ED Notes (Signed)
Pt. presents with wheezing /SOB and dry cough onset this evening .

## 2015-11-13 NOTE — ED Provider Notes (Signed)
CSN: [id number]     Arrival date & time 11/12/15  2354 History  By signing my name below, I, [name], attest that this documentation has been prepared under the direction and in the presence of Zadie Rhine, MD. Electronically Signed: [name], ED Scribe. 11/13/2015. 12:43 AM  Chief Complaint  Patient presents with  . Asthma    Patient is a 25 y.o. female presenting with asthma. The history is provided by the patient. No language interpreter was used.  Asthma The current episode started 3 to 5 hours ago. The problem occurs constantly. The problem has been gradually worsening. Associated symptoms include abdominal pain and shortness of breath. Nothing aggravates the symptoms. Nothing relieves the symptoms. She has tried nothing for the symptoms.   Andrea Daniels is a 25 y.o. female with PMHx of asthma  who presents to the Emergency Department complaining of constant SOB with onset 5.5 hours ago. Associated symptoms include dry cough, wheezing, chest/abdominal tightness, and back pain. She has had similar symptoms in the past with an asthma attack and is out of her home inhalers.  Pt denies fever, hemoptysis, nausea, and vomiting. She has been admitted for asthma before but has never required intubation. Pt denies smoking.   Past Medical History  Diagnosis Date  . UTI (lower urinary tract infection)   . Asthma   . Gastritis   . Ovarian cyst   . Gastritis    Past Surgical History  Procedure Laterality Date  . No past surgeries     Family History  Problem Relation Age of Onset  . Hyperlipidemia Other   . Hypertension Other   . Arthritis Other   . Cancer Other     breast cancer  . Hypertension Other   . Asthma Other   . Diabetes Other    Social History  Substance Use Topics  . Smoking status: Former Smoker -- 0.10 packs/day    Types: Cigarettes  . Smokeless tobacco: Never Used  . Alcohol Use: Yes   OB History    Gravida Para Term Preterm AB TAB SAB Ectopic  Multiple Living   0              Review of Systems  Constitutional: Negative for fever.  Respiratory: Positive for cough, chest tightness, shortness of breath and wheezing.   Gastrointestinal: Positive for abdominal pain. Negative for nausea and vomiting.  Musculoskeletal: Positive for back pain.  All other systems reviewed and are negative.  Allergies  Review of patient's allergies indicates no known allergies.  Home Medications   Prior to Admission medications   Medication Sig Start Date End Date Taking? Authorizing Provider  acetaminophen (TYLENOL) 500 MG tablet Take 500 mg by mouth every 6 (six) hours as needed for moderate pain.    Historical Provider, MD  albuterol (PROVENTIL HFA;VENTOLIN HFA) 108 (90 Base) MCG/ACT inhaler Inhale 2 puffs into the lungs every 4 (four) hours as needed for wheezing or shortness of breath. 07/20/15   Roxy Horseman, PA-C  albuterol (PROVENTIL HFA;VENTOLIN HFA) 108 (90 Base) MCG/ACT inhaler Inhale 2 puffs into the lungs every 4 (four) hours as needed for wheezing or shortness of breath. 09/26/15   Lula Olszewski, MD  HYDROcodone-homatropine [facility]) 5-1.5 MG/5ML syrup Take 5 mLs by mouth every 6 (six) hours as needed for cough. 07/20/15   Roxy Horseman, PA-C  ibuprofen (ADVIL,MOTRIN) 200 MG tablet Take 200 mg by mouth every 6 (six) hours as needed for fever.    Historical Provider, MD  omeprazole (PRILOSEC) 20 MG capsule Take 1 capsule (20 mg total) by mouth daily. 10/26/15   Heather Laisure, PA-C  ondansetron (ZOFRAN ODT) 4 MG disintegrating tablet Take 1 tablet (4 mg total) by mouth every 8 (eight) hours as needed for nausea or vomiting. 10/26/15   Heather Laisure, PA-C   BP 149/113 mmHg  Pulse 115  Temp(Src) 98.7 F (37.1 C) (Oral)  Resp 20  SpO2 100%  LMP 09/28/2015 (Approximate) Physical Exam CONSTITUTIONAL: Well developed/well nourished, anxious  HEAD: Normocephalic/atraumatic EYES: EOMI/PERRL ENMT: Mucous membranes moist NECK: supple no  meningeal signs SPINE/BACK:entire spine nontender CV: S1/S2 noted, no murmurs/rubs/gallops noted LUNGS: Tachypneic, wheezing bilaterally  ABDOMEN: soft, nontender, no rebound or guarding, bowel sounds noted throughout abdomen GU:no cva tenderness NEURO: Pt is awake/alert/appropriate, moves all extremitiesx4.  No facial droop.   EXTREMITIES: pulses normal/equal, full ROM SKIN: warm, color normal PSYCH: Anxious   ED Course  Procedures DIAGNOSTIC STUDIES: Oxygen Saturation is 100% on RA,  normal by my interpretation.    COORDINATION OF CARE: 12:40 AM Discussed treatment plan which includes a breathing treatment with pt at bedside and pt agreed to plan.  2:19 AM Pt improved Work of breathing improved Wheezing resolved No hypoxia No distress noted She reports feeling improved She reports similar to prior exacerbations but ran out of inhaler She is safe for d/c home She is tachycardic but likely due to albuterol We discussed strict return precautions   Medications  albuterol (PROVENTIL) (2.5 MG/3ML) 0.083% nebulizer solution 5 mg (5 mg Nebulization Not Given 11/13/15 0024)  albuterol (PROVENTIL) (2.5 MG/3ML) 0.083% nebulizer solution 5 mg (5 mg Nebulization Given 11/13/15 0054)  ipratropium (ATROVENT) nebulizer solution 0.5 mg (0.5 mg Nebulization Given 11/13/15 0054)  predniSONE (DELTASONE) tablet 60 mg (60 mg Oral Given 11/13/15 0054)  albuterol (PROVENTIL) (2.5 MG/3ML) 0.083% nebulizer solution 5 mg (5 mg Nebulization Given 11/13/15 0135)    MDM   Final diagnoses:  Asthma attack    Nursing notes including past medical history and social history reviewed and considered in documentation   I personally performed the services described in this documentation, which was scribed in my presence. The recorded information has been reviewed and is accurate.       Zadie Rhineonald Bonnye Halle, MD 11/13/15 [phone]

## 2015-11-13 NOTE — ED Notes (Signed)
Called pt with no anwser

## 2015-12-27 ENCOUNTER — Emergency Department (HOSPITAL_COMMUNITY)
Admission: EM | Admit: 2015-12-27 | Discharge: 2015-12-27 | Disposition: A | Discharge status: Home / Self Care | Payer: 59 | Attending: Emergency Medicine | Admitting: Emergency Medicine

## 2015-12-27 ENCOUNTER — Encounter (HOSPITAL_COMMUNITY): Payer: Self-pay | Admitting: *Deleted

## 2015-12-27 DIAGNOSIS — J45909 Unspecified asthma, uncomplicated: Secondary | ICD-10-CM | POA: Insufficient documentation

## 2015-12-27 DIAGNOSIS — R112 Nausea with vomiting, unspecified: Secondary | ICD-10-CM

## 2015-12-27 DIAGNOSIS — N760 Acute vaginitis: Secondary | ICD-10-CM | POA: Insufficient documentation

## 2015-12-27 DIAGNOSIS — Z87891 Personal history of nicotine dependence: Secondary | ICD-10-CM | POA: Insufficient documentation

## 2015-12-27 DIAGNOSIS — R197 Diarrhea, unspecified: Secondary | ICD-10-CM | POA: Insufficient documentation

## 2015-12-27 DIAGNOSIS — B9689 Other specified bacterial agents as the cause of diseases classified elsewhere: Secondary | ICD-10-CM

## 2015-12-27 DIAGNOSIS — Z7982 Long term (current) use of aspirin: Secondary | ICD-10-CM | POA: Insufficient documentation

## 2015-12-27 LAB — COMPREHENSIVE METABOLIC PANEL
ALT: 15 U/L (ref 14–54)
AST: 23 U/L (ref 15–41)
Albumin: 3.9 g/dL (ref 3.5–5.0)
Alkaline Phosphatase: 57 U/L (ref 38–126)
Anion gap: 6 (ref 5–15)
BUN: 11 mg/dL (ref 6–20)
CO2: 25 mmol/L (ref 22–32)
Calcium: 8.8 mg/dL — ABNORMAL LOW (ref 8.9–10.3)
Chloride: 109 mmol/L (ref 101–111)
Creatinine, Ser: 0.83 mg/dL (ref 0.44–1.00)
GFR calc Af Amer: >60 mL/min (ref >60)
GFR calc non Af Amer: >60 mL/min (ref >60)
Glucose, Bld: 90 mg/dL (ref 65–99)
Potassium: 4.4 mmol/L (ref 3.5–5.1)
Sodium: 140 mmol/L (ref 135–145)
Total Bilirubin: 0.8 mg/dL (ref 0.3–1.2)
Total Protein: 7.4 g/dL (ref 6.5–8.1)

## 2015-12-27 LAB — CBC WITH DIFFERENTIAL/PLATELET
Basophils Absolute: 0 10*3/uL (ref 0.0–0.1)
Basophils Relative: 0 %
Eosinophils Absolute: 0.7 10*3/uL (ref 0.0–0.7)
Eosinophils Relative: 13 %
HCT: 33.1 % — ABNORMAL LOW (ref 36.0–46.0)
Hemoglobin: 10.9 g/dL — ABNORMAL LOW (ref 12.0–15.0)
Lymphocytes Relative: 43 %
Lymphs Abs: 2.5 10*3/uL (ref 0.7–4.0)
MCH: 23.5 pg — ABNORMAL LOW (ref 26.0–34.0)
MCHC: 32.9 g/dL (ref 30.0–36.0)
MCV: 71.3 fL — ABNORMAL LOW (ref 78.0–100.0)
Monocytes Absolute: 0.4 10*3/uL (ref 0.1–1.0)
Monocytes Relative: 8 %
Neutro Abs: 2 10*3/uL (ref 1.7–7.7)
Neutrophils Relative %: 36 %
Platelets: 453 10*3/uL — ABNORMAL HIGH (ref 150–400)
RBC: 4.64 MIL/uL (ref 3.87–5.11)
RDW: 14.6 % (ref 11.5–15.5)
WBC: 5.6 10*3/uL (ref 4.0–10.5)

## 2015-12-27 LAB — URINALYSIS, ROUTINE W REFLEX MICROSCOPIC
Bilirubin Urine: NEGATIVE
Glucose, UA: NEGATIVE mg/dL
Ketones, ur: NEGATIVE mg/dL
Leukocytes, UA: NEGATIVE
Nitrite: NEGATIVE
Protein, ur: NEGATIVE mg/dL
Specific Gravity, Urine: 1.023 (ref 1.005–1.030)
pH: 7.5 (ref 5.0–8.0)

## 2015-12-27 LAB — WET PREP, GENITAL
Sperm: NONE SEEN
Trich, Wet Prep: NONE SEEN
Yeast Wet Prep HPF POC: NONE SEEN

## 2015-12-27 LAB — URINE MICROSCOPIC-ADD ON

## 2015-12-27 LAB — POC URINE PREG, ED: Preg Test, Ur: NEGATIVE

## 2015-12-27 LAB — LIPASE, BLOOD: Lipase: 36 U/L (ref 11–51)

## 2015-12-27 MED ORDER — SODIUM CHLORIDE 0.9 % IV BOLUS (SEPSIS)
1000.0000 mL | Freq: Once | INTRAVENOUS | Status: AC
  Administered 2015-12-27: 1000 mL via INTRAVENOUS

## 2015-12-27 MED ORDER — ONDANSETRON 4 MG PO TBDP: ORAL_TABLET | ORAL | Status: DC

## 2015-12-27 MED ORDER — ONDANSETRON HCL 4 MG/2ML IJ SOLN
4.0000 mg | Freq: Once | INTRAMUSCULAR | Status: AC
  Administered 2015-12-27: 4 mg via INTRAVENOUS
  Filled 2015-12-27: qty 2

## 2015-12-27 MED ORDER — METRONIDAZOLE 500 MG PO TABS: 500.0000 mg | ORAL_TABLET | Freq: Two times a day (BID) | ORAL | Status: DC

## 2015-12-27 NOTE — ED Notes (Signed)
Bed: [id number] Expected date:  Expected time:  Means of arrival:  Comments: 25 yo abd pain x2 days

## 2015-12-27 NOTE — ED Notes (Signed)
Pt comes in via EMS from home, with c/o generalized abdominal pain, nausea and diarrhea x 2 days. Denies fever, vomiting

## 2015-12-27 NOTE — Discharge Instructions (Signed)

## 2015-12-27 NOTE — ED Notes (Signed)
RN at bedside starting Iv will collect labs

## 2015-12-27 NOTE — ED Provider Notes (Signed)
CSN: [id number]     Arrival date & time 12/27/15  1000 History   First MD Initiated Contact with Patient 12/27/15 1006     Chief Complaint  Patient presents with  . Abdominal Pain     (Consider location/radiation/quality/duration/timing/severity/associated sxs/prior Treatment) Patient is a 25 y.o. female presenting with general illness. The history is provided by the patient.  Illness Severity:  Moderate Onset quality:  Gradual Duration:  2 days Timing:  Constant Progression:  Worsening Chronicity:  New Associated symptoms: abdominal pain, diarrhea, nausea and vomiting   Associated symptoms: no chest pain, no congestion, no fever, no headaches, no myalgias, no rhinorrhea, no shortness of breath and no wheezing    25 yo F With a chief complaint of nausea vomiting diarrhea. This been going on for the past couple days. She denies fevers or chills. Denies blood or dark stool. Denies bloody or green emesis. Patient having all over crampy abdominal pain. This comes and goes with her vomiting and diarrhea. She has been weak at home. Has been having vaginal discharge and bleeding for the past few days as well. Her abdominal pain is worse bilateral lower quadrants.  Past Medical History  Diagnosis Date  . UTI (lower urinary tract infection)   . Asthma   . Gastritis   . Ovarian cyst   . Gastritis    Past Surgical History  Procedure Laterality Date  . No past surgeries     Family History  Problem Relation Age of Onset  . Hyperlipidemia Other   . Hypertension Other   . Arthritis Other   . Cancer Other     breast cancer  . Hypertension Other   . Asthma Other   . Diabetes Other    Social History  Substance Use Topics  . Smoking status: Former Smoker -- 0.10 packs/day    Types: Cigarettes  . Smokeless tobacco: Never Used  . Alcohol Use: Yes   OB History    Gravida Para Term Preterm AB TAB SAB Ectopic Multiple Living   0              Review of Systems  Constitutional:  Negative for fever and chills.  HENT: Negative for congestion and rhinorrhea.   Eyes: Negative for redness and visual disturbance.  Respiratory: Negative for shortness of breath and wheezing.   Cardiovascular: Negative for chest pain and palpitations.  Gastrointestinal: Positive for nausea, vomiting, abdominal pain and diarrhea.  Genitourinary: Positive for vaginal bleeding and vaginal discharge. Negative for dysuria and urgency.  Musculoskeletal: Negative for myalgias and arthralgias.  Skin: Negative for pallor and wound.  Neurological: Negative for dizziness and headaches.       Allergies  Review of patient's allergies indicates no known allergies.  Home Medications   Prior to Admission medications   Medication Sig Start Date End Date Taking? Authorizing Provider  albuterol (PROVENTIL HFA;VENTOLIN HFA) 108 (90 Base) MCG/ACT inhaler Inhale 2 puffs into the lungs every 4 (four) hours as needed for wheezing or shortness of breath. 11/13/15  Yes Zadie Rhineonald Wickline, MD  Aspirin-Acetaminophen-Caffeine (GOODY HEADACHE PO) Take 1 each by mouth once.    Yes Historical Provider, MD  metroNIDAZOLE (FLAGYL) 500 MG tablet Take 1 tablet (500 mg total) by mouth 2 (two) times daily. One po bid x 7 days 12/27/15   Melene Planan Correen Bubolz, DO  omeprazole (PRILOSEC) 20 MG capsule Take 1 capsule (20 mg total) by mouth daily. Patient not taking: Reported on 12/27/2015 10/26/15   Santiago GladHeather Laisure, PA-C  ondansetron (  ZOFRAN ODT) 4 MG disintegrating tablet Take 1 tablet (4 mg total) by mouth every 8 (eight) hours as needed for nausea or vomiting. Patient not taking: Reported on 12/27/2015 10/26/15   Santiago Glad, PA-C  ondansetron (ZOFRAN ODT) 4 MG disintegrating tablet  ODT q4 hours prn nausea/vomit 12/27/15   Melene Plan, DO  predniSONE (DELTASONE) 50 MG tablet One tablet PO daily for 4 days Patient not taking: Reported on 12/27/2015 11/13/15   Zadie Rhine, MD   BP 139/107 mmHg  Pulse 62  Temp(Src) 98.6 F (37 C) (Oral)   Resp 17  Ht  (1.499 m)  SpO2 97%  LMP 12/22/2015 Physical Exam  Constitutional: She is oriented to person, place, and time. She appears well-developed and well-nourished. No distress.  HENT:  Head: Normocephalic and atraumatic.  Eyes: EOM are normal. Pupils are equal, round, and reactive to light.  Neck: Normal range of motion. Neck supple.  Cardiovascular: Normal rate and regular rhythm.  Exam reveals no gallop and no friction rub.   No murmur heard. Pulmonary/Chest: Effort normal. She has no wheezes. She has no rales.  Abdominal: Soft. She exhibits no distension. There is no tenderness. There is no rebound and no guarding.  Genitourinary: Cervix exhibits no motion tenderness and no discharge. Right adnexum displays no mass and no tenderness. Left adnexum displays no mass and no tenderness.  Musculoskeletal: She exhibits no edema or tenderness.  Neurological: She is alert and oriented to person, place, and time.  Skin: Skin is warm and dry. She is not diaphoretic.  Psychiatric: She has a normal mood and affect. Her behavior is normal.  Nursing note and vitals reviewed.   ED Course  Procedures (including critical care time) Labs Review Labs Reviewed  WET PREP, GENITAL - Abnormal; Notable for the following:    Clue Cells Wet Prep HPF POC PRESENT (*)    WBC, Wet Prep HPF POC FEW (*)    All other components within normal limits  COMPREHENSIVE METABOLIC PANEL - Abnormal; Notable for the following:    Calcium 8.8 (*)    All other components within normal limits  CBC WITH DIFFERENTIAL/PLATELET - Abnormal; Notable for the following:    Hemoglobin 10.9 (*)    HCT 33.1 (*)    MCV 71.3 (*)    MCH 23.5 (*)    Platelets 453 (*)    All other components within normal limits  URINALYSIS, ROUTINE W REFLEX MICROSCOPIC (NOT AT [facility]) - Abnormal; Notable for the following:    APPearance CLOUDY (*)    Hgb urine dipstick LARGE (*)    All other components within normal limits  URINE  MICROSCOPIC-ADD ON - Abnormal; Notable for the following:    Squamous Epithelial / LPF 6-30 (*)    Bacteria, UA MANY (*)    All other components within normal limits  LIPASE, BLOOD  HIV ANTIBODY (ROUTINE TESTING)  POC URINE PREG, ED  GC/CHLAMYDIA PROBE AMP ([place]) NOT AT [facility]    Imaging Review No results found. I have personally reviewed and evaluated these images and lab results as part of my medical decision-making.   EKG Interpretation None      MDM   Final diagnoses:  BV (bacterial vaginosis)  Nausea vomiting and diarrhea    25 yo F with a chief complaint of nausea vomiting diarrhea. Suspect that the patient likely has a viral illness.  BV will treat.  PCP follow up.  1:33 PM:  I have discussed the diagnosis/risks/treatment options with  the patient and family and believe the pt to be eligible for discharge home to follow-up with PCP. We also discussed returning to the ED immediately if new or worsening sx occur. We discussed the sx which are most concerning (e.g., sudden worsening pain, fever, inability to tolerate by mouth) that necessitate immediate return. Medications administered to the patient during their visit and any new prescriptions provided to the patient are listed below.  Medications given during this visit Medications  sodium chloride 0.9 % bolus 1,000 mL (0 mLs Intravenous Stopped 12/27/15 1330)  ondansetron (ZOFRAN) injection 4 mg (4 mg Intravenous Given 12/27/15 1031)    New Prescriptions   METRONIDAZOLE (FLAGYL) 500 MG TABLET    Take 1 tablet (500 mg total) by mouth 2 (two) times daily. One po bid x 7 days   ONDANSETRON (ZOFRAN ODT) 4 MG DISINTEGRATING TABLET    4mg  ODT q4 hours prn nausea/vomit    The patient appears reasonably screen and/or stabilized for discharge and I doubt any other medical condition or other [facility] requiring further screening, evaluation, or treatment in the ED at this time prior to discharge.    Melene Planan Seanpaul Preece, DO 12/27/15 1333

## 2015-12-28 LAB — GC/CHLAMYDIA PROBE AMP (~~LOC~~) NOT AT ARMC
Chlamydia: NEGATIVE
Neisseria Gonorrhea: NEGATIVE

## 2015-12-28 LAB — HIV ANTIBODY (ROUTINE TESTING W REFLEX): HIV Screen 4th Generation wRfx: NONREACTIVE

## 2016-01-17 ENCOUNTER — Emergency Department (HOSPITAL_COMMUNITY)
Admission: EM | Admit: 2016-01-17 | Discharge: 2016-01-18 | Disposition: A | Discharge status: Home / Self Care | Payer: 59 | Attending: Emergency Medicine | Admitting: Emergency Medicine

## 2016-01-17 ENCOUNTER — Encounter (HOSPITAL_COMMUNITY): Payer: Self-pay | Admitting: Emergency Medicine

## 2016-01-17 DIAGNOSIS — J45901 Unspecified asthma with (acute) exacerbation: Secondary | ICD-10-CM | POA: Insufficient documentation

## 2016-01-17 DIAGNOSIS — R112 Nausea with vomiting, unspecified: Secondary | ICD-10-CM

## 2016-01-17 DIAGNOSIS — Z87891 Personal history of nicotine dependence: Secondary | ICD-10-CM | POA: Insufficient documentation

## 2016-01-17 DIAGNOSIS — R197 Diarrhea, unspecified: Secondary | ICD-10-CM | POA: Insufficient documentation

## 2016-01-17 LAB — URINALYSIS, ROUTINE W REFLEX MICROSCOPIC
Bilirubin Urine: NEGATIVE
Glucose, UA: NEGATIVE mg/dL
Ketones, ur: NEGATIVE mg/dL
Leukocytes, UA: NEGATIVE
Nitrite: NEGATIVE
Protein, ur: 30 mg/dL — AB
Specific Gravity, Urine: 1.023 (ref 1.005–1.030)
pH: 8.5 — ABNORMAL HIGH (ref 5.0–8.0)

## 2016-01-17 LAB — URINE MICROSCOPIC-ADD ON
Bacteria, UA: NONE SEEN
WBC, UA: NONE SEEN WBC/hpf (ref 0–5)

## 2016-01-17 LAB — CBC
HCT: 38.9 % (ref 36.0–46.0)
Hemoglobin: 12.1 g/dL (ref 12.0–15.0)
MCH: 23.1 pg — ABNORMAL LOW (ref 26.0–34.0)
MCHC: 31.1 g/dL (ref 30.0–36.0)
MCV: 74.2 fL — ABNORMAL LOW (ref 78.0–100.0)
Platelets: 411 10*3/uL — ABNORMAL HIGH (ref 150–400)
RBC: 5.24 MIL/uL — ABNORMAL HIGH (ref 3.87–5.11)
RDW: 14.5 % (ref 11.5–15.5)
WBC: 6.6 10*3/uL (ref 4.0–10.5)

## 2016-01-17 LAB — COMPREHENSIVE METABOLIC PANEL
ALT: 13 U/L — ABNORMAL LOW (ref 14–54)
AST: 21 U/L (ref 15–41)
Albumin: 3.9 g/dL (ref 3.5–5.0)
Alkaline Phosphatase: 63 U/L (ref 38–126)
Anion gap: 13 (ref 5–15)
BUN: 6 mg/dL (ref 6–20)
CO2: 21 mmol/L — ABNORMAL LOW (ref 22–32)
Calcium: 8.5 mg/dL — ABNORMAL LOW (ref 8.9–10.3)
Chloride: 103 mmol/L (ref 101–111)
Creatinine, Ser: 0.82 mg/dL (ref 0.44–1.00)
GFR calc Af Amer: >60 mL/min (ref >60)
GFR calc non Af Amer: >60 mL/min (ref >60)
Glucose, Bld: 104 mg/dL — ABNORMAL HIGH (ref 65–99)
Potassium: 4.1 mmol/L (ref 3.5–5.1)
Sodium: 137 mmol/L (ref 135–145)
Total Bilirubin: 0.3 mg/dL (ref 0.3–1.2)
Total Protein: 7.2 g/dL (ref 6.5–8.1)

## 2016-01-17 LAB — POC URINE PREG, ED: Preg Test, Ur: NEGATIVE

## 2016-01-17 LAB — LIPASE, BLOOD: Lipase: 20 U/L (ref 11–51)

## 2016-01-17 MED ORDER — SODIUM CHLORIDE 0.9 % IV BOLUS (SEPSIS): 1000.0000 mL | Freq: Once | INTRAVENOUS | Status: DC

## 2016-01-17 MED ORDER — ONDANSETRON 4 MG PO TBDP
ORAL_TABLET | ORAL | Status: AC
  Filled 2016-01-17: qty 2

## 2016-01-17 MED ORDER — ONDANSETRON 4 MG PO TBDP
8.0000 mg | ORAL_TABLET | Freq: Once | ORAL | Status: AC
  Administered 2016-01-17: 8 mg via ORAL

## 2016-01-17 NOTE — ED Triage Notes (Signed)
Pt. reports emesis , diarrhea and intermittent mild generalized abdominal pain onset this morning , denies fever or chills.

## 2016-01-17 NOTE — ED Notes (Signed)
Pt given diet ginger ale for PO challenge 

## 2016-01-17 NOTE — ED Notes (Signed)
Pt states was drinking alcohol to excess last night, vomited x2 today, clear vomit with food particles. Also reports some diarrhea. Last BM today. Reports cough for the last several days. Pt with hx of asthma, not currently using prescribed inhalers, wheezing heard in all fields. Last vomited at 4pm. Pt was watching a movie on her iphone while waiting in triage. Has not tried to eat or drink anything since last episode of emesis. Pt reports nausea has improved with ODT zofran.

## 2016-01-17 NOTE — Discharge Instructions (Signed)
You were seen and evaluated today for your nausea, vomiting, and diarrhea.  Likely this was related to your alcohol consumption from last night.  Please, drink lots of water and keep yourself well hydrated.  Follow up with your primary care physician for reevaluation.

## 2016-01-18 NOTE — ED Notes (Signed)
Pt departed in NAD, refused use of wheelchair.  

## 2016-01-22 NOTE — ED Provider Notes (Signed)
[facility]-EMERGENCY DEPT Provider Note   CSN: [id number] Arrival date & time: 01/17/16  0786  First Provider Contact:  First MD Initiated Contact with Patient 01/17/16 2243        History   Chief Complaint Chief Complaint  Patient presents with  . Emesis    HPI Andrea Daniels is a 25 y.o. female.  25 y.o. Female presents for nausea, vomiting, diarrhea. Patient states that she was out drinking last night and that she was drinking excessively and mixing different types of alcohol.  Since that time she has developed nausea and vomiting and some loose stool.  Reports feeling fine before drinking last night.  No fevers, chills, abdominal pain.        Past Medical History:  Diagnosis Date  . Asthma   . Gastritis   . Gastritis   . Ovarian cyst   . UTI (lower urinary tract infection)     Patient Active Problem List   Diagnosis Date Noted  . Hypophosphatemia 03/20/2015  . Low TSH level 03/20/2015  . Metabolic acidosis 03/20/2015  . Microcytic anemia 03/20/2015  . Asthma exacerbation 03/19/2015  . Elevated glucose 07/03/2014  . Asthma with exacerbation 10/22/2013  . Hives 07/11/2013  . Status asthmaticus 06/08/2013  . Allergic rhinitis 04/24/2012  . Preventative health care 08/14/2011    Past Surgical History:  Procedure Laterality Date  . NO PAST SURGERIES      OB History    Gravida Para Term Preterm AB Living   0             SAB TAB Ectopic Multiple Live Births                   Home Medications    Prior to Admission medications   Medication Sig Start Date End Date Taking? Authorizing Provider  acetaminophen (TYLENOL) 500 MG tablet Take 500 mg by mouth every 6 (six) hours as needed for mild pain.   Yes Historical Provider, MD  albuterol (PROVENTIL HFA;VENTOLIN HFA) 108 (90 Base) MCG/ACT inhaler Inhale 2 puffs into the lungs every 4 (four) hours as needed for wheezing or shortness of breath. 11/13/15  Yes Zadie Rhine, MD  ondansetron (ZOFRAN ODT) 4 MG  disintegrating tablet 4mg  ODT q4 hours prn nausea/vomit 12/27/15  Yes Melene Plan, DO  metroNIDAZOLE (FLAGYL) 500 MG tablet Take 1 tablet (500 mg total) by mouth 2 (two) times daily. One po bid x 7 days Patient not taking: Reported on 01/17/2016 12/27/15   Melene Plan, DO    Family History Family History  Problem Relation Age of Onset  . Arthritis Other   . Cancer Other     breast cancer  . Hypertension Other   . Asthma Other   . Hyperlipidemia Other   . Hypertension Other   . Diabetes Other     Social History Social History  Substance Use Topics  . Smoking status: Former Smoker    Packs/day: 0.10    Types: Cigarettes  . Smokeless tobacco: Never Used  . Alcohol use Yes     Allergies   Review of patient's allergies indicates no known allergies.   Review of Systems Review of Systems  Constitutional: Positive for appetite change. Negative for fatigue and fever.  HENT: Negative for congestion, postnasal drip and rhinorrhea.   Eyes: Negative for visual disturbance.  Respiratory: Negative for chest tightness, shortness of breath and wheezing.   Cardiovascular: Negative for chest pain and palpitations.  Gastrointestinal: Positive for diarrhea, nausea and  vomiting. Negative for abdominal pain.  Genitourinary: Negative for difficulty urinating, flank pain, frequency and urgency.  Musculoskeletal: Negative for back pain and myalgias.  Skin: Negative for color change and rash.  Neurological: Negative for dizziness, seizures, weakness, numbness and headaches.  Hematological: Does not bruise/bleed easily.     Physical Exam Updated Vital Signs BP 152/98   Pulse 82   Temp 98.3 F (36.8 C) (Oral)   Resp 20   Ht 4\' 11"  (1.499 m)   LMP 12/22/2015   SpO2 98%   Physical Exam  Constitutional: She is oriented to person, place, and time. She appears well-developed and well-nourished. No distress.  HENT:  Head: Normocephalic and atraumatic.  Right Ear: External ear normal.  Left Ear:  External ear normal.  Nose: Nose normal.  Mouth/Throat: Oropharynx is clear and moist. No oropharyngeal exudate.  Eyes: EOM are normal. Pupils are equal, round, and reactive to light.  Neck: Normal range of motion. Neck supple.  Cardiovascular: Normal rate, regular rhythm, normal heart sounds and intact distal pulses.   No murmur heard. Pulmonary/Chest: Effort normal. No respiratory distress. She has no wheezes. She has no rales.  Abdominal: Soft. She exhibits no distension. There is no tenderness.  Musculoskeletal: Normal range of motion. She exhibits no edema or tenderness.  Neurological: She is alert and oriented to person, place, and time.  Skin: Skin is warm and dry. No rash noted. She is not diaphoretic.  Vitals reviewed.    ED Treatments / Results  Labs (all labs ordered are listed, but only abnormal results are displayed) Labs Reviewed  COMPREHENSIVE METABOLIC PANEL - Abnormal; Notable for the following:       Result Value   CO2 21 (*)    Glucose, Bld 104 (*)    Calcium 8.5 (*)    ALT 13 (*)    All other components within normal limits  CBC - Abnormal; Notable for the following:    RBC 5.24 (*)    MCV 74.2 (*)    MCH 23.1 (*)    Platelets 411 (*)    All other components within normal limits  URINALYSIS, ROUTINE W REFLEX MICROSCOPIC (NOT AT [facility]) - Abnormal; Notable for the following:    pH 8.5 (*)    Hgb urine dipstick MODERATE (*)    Protein, ur 30 (*)    All other components within normal limits  URINE MICROSCOPIC-ADD ON - Abnormal; Notable for the following:    Squamous Epithelial / LPF 0-5 (*)    All other components within normal limits  LIPASE, BLOOD  POC URINE PREG, ED    EKG  EKG Interpretation None       Radiology No results found.  Procedures Procedures (including critical care time)  Medications Ordered in ED Medications  ondansetron (ZOFRAN-ODT) disintegrating tablet 8 mg (8 mg Oral Given 01/17/16 2116)     Initial Impression /  Assessment and Plan / ED Course  I have reviewed the triage vital signs and the nursing notes.  Pertinent labs & imaging results that were available during my care of the patient were reviewed by me and considered in my medical decision making (see chart for details).  Clinical Course   PAtient was seen and evaluated in stable condition.  Patient with benign abdominal examination.  Felt improved prior to my evaluation.  Labs unremarkable other than mildly low bicarb.  Patient was given a bolus of IV fluids and tolerated PO.  She was discharged home in stable condition.  She and her significant other expressed understanding and agreement with plan of care.  Final Clinical Impressions(s) / ED Diagnoses   Final diagnoses:  Nausea vomiting and diarrhea    New Prescriptions Discharge Medication List as of 01/17/2016 11:47 PM       Leta Baptist, MD 01/22/16 1121

## 2016-03-29 ENCOUNTER — Encounter (HOSPITAL_COMMUNITY): Payer: Self-pay

## 2016-03-29 ENCOUNTER — Emergency Department (HOSPITAL_COMMUNITY): Payer: 59

## 2016-03-29 ENCOUNTER — Emergency Department (HOSPITAL_COMMUNITY)
Admission: EM | Admit: 2016-03-29 | Discharge: 2016-03-29 | Disposition: A | Discharge status: Home / Self Care | Payer: 59 | Attending: Emergency Medicine | Admitting: Emergency Medicine

## 2016-03-29 DIAGNOSIS — Z79899 Other long term (current) drug therapy: Secondary | ICD-10-CM | POA: Insufficient documentation

## 2016-03-29 DIAGNOSIS — I10 Essential (primary) hypertension: Secondary | ICD-10-CM | POA: Insufficient documentation

## 2016-03-29 DIAGNOSIS — J4541 Moderate persistent asthma with (acute) exacerbation: Secondary | ICD-10-CM | POA: Insufficient documentation

## 2016-03-29 DIAGNOSIS — Z87891 Personal history of nicotine dependence: Secondary | ICD-10-CM | POA: Insufficient documentation

## 2016-03-29 DIAGNOSIS — Z7951 Long term (current) use of inhaled steroids: Secondary | ICD-10-CM | POA: Insufficient documentation

## 2016-03-29 MED ORDER — ALBUTEROL SULFATE (2.5 MG/3ML) 0.083% IN NEBU
5.0000 mg | INHALATION_SOLUTION | Freq: Once | RESPIRATORY_TRACT | Status: AC
  Administered 2016-03-29: 5 mg via RESPIRATORY_TRACT
  Filled 2016-03-29: qty 6

## 2016-03-29 MED ORDER — PREDNISONE 20 MG PO TABS: 60.0000 mg | ORAL_TABLET | Freq: Every day | ORAL | 0 refills | Status: DC

## 2016-03-29 MED ORDER — PREDNISONE 20 MG PO TABS
60.0000 mg | ORAL_TABLET | Freq: Once | ORAL | Status: AC
  Administered 2016-03-29: 60 mg via ORAL
  Filled 2016-03-29: qty 3

## 2016-03-29 MED ORDER — ALBUTEROL SULFATE HFA 108 (90 BASE) MCG/ACT IN AERS: 2.0000 | INHALATION_SPRAY | RESPIRATORY_TRACT | 1 refills | Status: DC | PRN

## 2016-03-29 MED ORDER — IPRATROPIUM BROMIDE 0.02 % IN SOLN
0.5000 mg | Freq: Once | RESPIRATORY_TRACT | Status: AC
  Administered 2016-03-29: 0.5 mg via RESPIRATORY_TRACT
  Filled 2016-03-29: qty 2.5

## 2016-03-29 NOTE — ED Provider Notes (Signed)
[facility]-EMERGENCY DEPT Provider Note   CSN: [id number] Arrival date & time: 03/29/16  0807     History   Chief Complaint Chief Complaint  Patient presents with  . Respiratory Distress    HPI Aliviyah L Jenelle MagesHairston is a 25 y.o. female.  Patient with hx asthma, c/o non prod cough, and increased sob, for the past day.  Hx same, related to asthma. Symptoms moderate, persistent, improved w neb via ems. Denies chest pain or discomfort. No sore throat. No leg pain or swelling. No orthopnea or pnd.  Patient denies fever or chills. Compliant w normal meds. Non smoker. No known ill contacts.    The history is provided by the patient.    Past Medical History:  Diagnosis Date  . Asthma   . Gastritis   . Gastritis   . Ovarian cyst   . UTI (lower urinary tract infection)     Patient Active Problem List   Diagnosis Date Noted  . Hypophosphatemia 03/20/2015  . Low TSH level 03/20/2015  . Metabolic acidosis 03/20/2015  . Microcytic anemia 03/20/2015  . Asthma exacerbation 03/19/2015  . Elevated glucose 07/03/2014  . Asthma with exacerbation 10/22/2013  . Hives 07/11/2013  . Status asthmaticus 06/08/2013  . Allergic rhinitis 04/24/2012  . Preventative health care 08/14/2011    Past Surgical History:  Procedure Laterality Date  . NO PAST SURGERIES      OB History    Gravida Para Term Preterm AB Living   0             SAB TAB Ectopic Multiple Live Births                   Home Medications    Prior to Admission medications   Medication Sig Start Date End Date Taking? Authorizing Provider  acetaminophen (TYLENOL) 500 MG tablet Take 500 mg by mouth every 6 (six) hours as needed for mild pain.    Historical Provider, MD  albuterol (PROVENTIL HFA;VENTOLIN HFA) 108 (90 Base) MCG/ACT inhaler Inhale 2 puffs into the lungs every 4 (four) hours as needed for wheezing or shortness of breath. 11/13/15   Zadie Rhineonald Wickline, MD  metroNIDAZOLE (FLAGYL) 500 MG tablet Take 1 tablet (500 mg total) by  mouth 2 (two) times daily. One po bid x 7 days Patient not taking: Reported on 01/17/2016 12/27/15   Melene Planan Floyd, DO  ondansetron (ZOFRAN ODT) 4 MG disintegrating tablet 4mg  ODT q4 hours prn nausea/vomit 12/27/15   Melene Planan Floyd, DO    Family History Family History  Problem Relation Age of Onset  . Arthritis Other   . Cancer Other     breast cancer  . Hypertension Other   . Asthma Other   . Hyperlipidemia Other   . Hypertension Other   . Diabetes Other     Social History Social History  Substance Use Topics  . Smoking status: Former Smoker    Packs/day: 0.10    Types: Cigarettes  . Smokeless tobacco: Never Used  . Alcohol use Yes     Comment: occasionally     Allergies   Review of patient's allergies indicates no known allergies.   Review of Systems Review of Systems  Constitutional: Negative for chills and fever.  HENT: Negative for sore throat.   Eyes: Negative for redness.  Respiratory: Positive for cough, shortness of breath and wheezing.   Cardiovascular: Negative for chest pain and leg swelling.  Gastrointestinal: Negative for abdominal pain.  Genitourinary: Negative for flank pain.  Musculoskeletal: Negative for back pain and neck pain.  Skin: Negative for rash.  Neurological: Negative for headaches.  Hematological: Does not bruise/bleed easily.  Psychiatric/Behavioral: Negative for confusion.     Physical Exam Updated Vital Signs BP (!) 165/118 (BP Location: Right Arm)   Pulse 105   Temp 97.8 F (36.6 C) (Oral)   Resp 22   Ht 4\' 11"  (1.499 m)   Wt 68 kg   LMP 03/02/2016   SpO2 95%   BMI 30.30 kg/m   Physical Exam  Constitutional: She appears well-developed and well-nourished. No distress.  HENT:  Mouth/Throat: Oropharynx is clear and moist.  Eyes: Conjunctivae are normal. No scleral icterus.  Neck: Neck supple. No tracheal deviation present.  Cardiovascular: Normal rate, regular rhythm, normal heart sounds and intact distal pulses.  Exam reveals no  gallop and no friction rub.   No murmur heard. Pulmonary/Chest: Effort normal. No respiratory distress. She has wheezes.  Abdominal: Soft. Normal appearance. She exhibits no distension. There is no tenderness.  Musculoskeletal: She exhibits no edema or tenderness.  Neurological: She is alert.  Skin: Skin is warm and dry. No rash noted.  Psychiatric: She has a normal mood and affect.  Nursing note and vitals reviewed.    ED Treatments / Results  Labs (all labs ordered are listed, but only abnormal results are displayed) Labs Reviewed - No data to display  EKG  EKG Interpretation None       Radiology Dg Chest 2 View  Result Date: 03/29/2016 CLINICAL DATA:  Increased wheezing, sob since approx 0400hrs this a.m, states hx of asthma EXAM: CHEST  2 VIEW COMPARISON:  09/26/2015 FINDINGS: Midline trachea.  Normal heart size and mediastinal contours. Sharp costophrenic angles.  No pneumothorax.  Clear lungs. Numerous leads and wires project over the chest. IMPRESSION: No active cardiopulmonary disease. Electronically Signed   By: Jeronimo Greaves M.D.   On: 03/29/2016 09:04    Procedures Procedures (including critical care time)  Medications Ordered in ED Medications  predniSONE (DELTASONE) tablet 60 mg (not administered)  albuterol (PROVENTIL) (2.5 MG/3ML) 0.083% nebulizer solution 5 mg (not administered)  ipratropium (ATROVENT) nebulizer solution 0.5 mg (not administered)     Initial Impression / Assessment and Plan / ED Course  I have reviewed the triage vital signs and the nursing notes.  Pertinent labs & imaging results that were available during my care of the patient were reviewed by me and considered in my medical decision making (see chart for details).  Clinical Course    Albuterol and atrovent neb. Cxr. Prednisone po.  Reviewed nursing notes and prior charts for additional history.   Recheck breathing comfortably. No wheezing. Bp improved from initial.   bp is  elevated - discussed w pt, will need pcp f/u, recheck if bp, and if remains elevated, discuss w pcp institution of bp meds.  Recheck no increased wob. No wheezing.   Final Clinical Impressions(s) / ED Diagnoses   Final diagnoses:  None    New Prescriptions New Prescriptions   No medications on file     Cathren Laine, MD 03/29/16 [phone]

## 2016-03-29 NOTE — ED Triage Notes (Signed)
Per EMS- Patient has a history of asthma. Patient had wheezing all fields. Patient was given Albuterol 10 mg, Albuterol 1 mg, and Solumedrol 125 mg IV prior to arrival to the ED. Wheezing decreased.

## 2016-03-29 NOTE — ED Notes (Signed)
Bed: WA17 Expected date:  Expected time:  Means of arrival:  Comments: EMS- 25yo F, SOB/treatment given

## 2016-03-29 NOTE — Discharge Instructions (Signed)
It was our pleasure to provide your ER care today - we hope that you feel better.  Take prednisone as prescribed. Use albuterol inhaler as need.  Follow up with primary care doctor in 1 week - also have your blood pressure rechecked then, as it is high today - if it remains high, discuss starting on blood pressure medication with your doctor.   Return to ER if worse, trouble breathing, other concern.

## 2016-03-29 NOTE — ED Notes (Signed)
Patient transported to X-ray 

## 2016-05-16 ENCOUNTER — Encounter (HOSPITAL_COMMUNITY): Payer: Self-pay | Admitting: Nurse Practitioner

## 2016-05-16 ENCOUNTER — Emergency Department (HOSPITAL_COMMUNITY)
Admission: EM | Admit: 2016-05-16 | Discharge: 2016-05-16 | Disposition: A | Discharge status: Home / Self Care | Payer: 59 | Attending: Emergency Medicine | Admitting: Emergency Medicine

## 2016-05-16 ENCOUNTER — Emergency Department (HOSPITAL_COMMUNITY): Payer: 59

## 2016-05-16 DIAGNOSIS — J45901 Unspecified asthma with (acute) exacerbation: Secondary | ICD-10-CM | POA: Insufficient documentation

## 2016-05-16 DIAGNOSIS — Z87891 Personal history of nicotine dependence: Secondary | ICD-10-CM | POA: Insufficient documentation

## 2016-05-16 MED ORDER — ALBUTEROL SULFATE (2.5 MG/3ML) 0.083% IN NEBU
INHALATION_SOLUTION | RESPIRATORY_TRACT | Status: AC
  Filled 2016-05-16: qty 6

## 2016-05-16 MED ORDER — PREDNISONE 20 MG PO TABS: 40.0000 mg | ORAL_TABLET | Freq: Every day | ORAL | 0 refills | Status: DC

## 2016-05-16 MED ORDER — ALBUTEROL SULFATE (2.5 MG/3ML) 0.083% IN NEBU
5.0000 mg | INHALATION_SOLUTION | Freq: Once | RESPIRATORY_TRACT | Status: AC
  Administered 2016-05-16: 5 mg via RESPIRATORY_TRACT

## 2016-05-16 NOTE — ED Provider Notes (Signed)
[facility]-EMERGENCY DEPT Provider Note   CSN: [id number] Arrival date & time: 05/16/16  1159     History   Chief Complaint Chief Complaint  Patient presents with  . Shortness of Breath    HPI Andrea Daniels is a 25 y.o. female.  HPI  The patient has a history of frequent asthma exacerbations, she was diagnosed with asthma approximately 7 years ago and last was in the emergency department approximately 6 weeks ago when she was having similar symptoms. She states that today is not as bad as usual, she has been having some wheezing and coughing and a tightness in her chest, she does have a small amount of clear sputum production. She took a dose of prednisone of 10 mg which she had at home however she states this was left over from a prior exacerbation when she did not take all of the medications. She does have an albuterol inhaler, she took 2 puffs without a spacer this morning but did not feel any better. She did receive albuterol nebulizer treatment prior to my exam while in the emergency department. She denies any other symptoms including fever, sore throat, headache, stuffy nose, abdominal pain, swelling, rashes, diarrhea or dysuria  Past Medical History:  Diagnosis Date  . Asthma   . Gastritis   . Gastritis   . Ovarian cyst   . UTI (lower urinary tract infection)     Patient Active Problem List   Diagnosis Date Noted  . Hypophosphatemia 03/20/2015  . Low TSH level 03/20/2015  . Metabolic acidosis 03/20/2015  . Microcytic anemia 03/20/2015  . Asthma exacerbation 03/19/2015  . Elevated glucose 07/03/2014  . Asthma with exacerbation 10/22/2013  . Hives 07/11/2013  . Status asthmaticus 06/08/2013  . Allergic rhinitis 04/24/2012  . Preventative health care 08/14/2011    Past Surgical History:  Procedure Laterality Date  . NO PAST SURGERIES      OB History    Gravida Para Term Preterm AB Living   0             SAB TAB Ectopic Multiple Live Births                    Home Medications    Prior to Admission medications   Medication Sig Start Date End Date Taking? Authorizing Provider  acetaminophen (TYLENOL) 500 MG tablet Take 500 mg by mouth every 6 (six) hours as needed for mild pain.    Historical Provider, MD  albuterol (PROVENTIL HFA;VENTOLIN HFA) 108 (90 Base) MCG/ACT inhaler Inhale 2 puffs into the lungs every 4 (four) hours as needed for wheezing or shortness of breath. 11/13/15   Zadie Rhine, MD  albuterol (PROVENTIL HFA;VENTOLIN HFA) 108 (90 Base) MCG/ACT inhaler Inhale 2 puffs into the lungs every 4 (four) hours as needed for wheezing or shortness of breath. 03/29/16   Cathren Laine, MD  metroNIDAZOLE (FLAGYL) 500 MG tablet Take 1 tablet (500 mg total) by mouth 2 (two) times daily. One po bid x 7 days Patient not taking: Reported on 01/17/2016 12/27/15   Melene Plan, DO  ondansetron (ZOFRAN ODT) 4 MG disintegrating tablet 4mg  ODT q4 hours prn nausea/vomit 12/27/15   Melene Plan, DO  predniSONE (DELTASONE) 20 MG tablet Take 2 tablets (40 mg total) by mouth daily. 05/16/16   Eber Hong, MD    Family History Family History  Problem Relation Age of Onset  . Arthritis Other   . Cancer Other     breast cancer  .  Hypertension Other   . Asthma Other   . Hyperlipidemia Other   . Hypertension Other   . Diabetes Other     Social History Social History  Substance Use Topics  . Smoking status: Former Smoker    Packs/day: 0.10    Types: Cigarettes  . Smokeless tobacco: Never Used  . Alcohol use Yes     Comment: occasionally     Allergies   Patient has no known allergies.   Review of Systems Review of Systems  All other systems reviewed and are negative.    Physical Exam Updated Vital Signs BP 147/93 (BP Location: Left Arm)   Pulse 77   Temp 98 F (36.7 C) (Oral)   Resp 20   Ht 4\' 11"  (1.499 m)   Wt 152 lb (68.9 kg)   SpO2 100%   BMI 30.70 kg/m   Physical Exam  Constitutional: She appears well-developed and  well-nourished. No distress.  HENT:  Head: Normocephalic and atraumatic.  Mouth/Throat: Oropharynx is clear and moist. No oropharyngeal exudate.  Eyes: Conjunctivae and EOM are normal. Pupils are equal, round, and reactive to light. Right eye exhibits no discharge. Left eye exhibits no discharge. No scleral icterus.  Neck: Normal range of motion. Neck supple. No JVD present. No thyromegaly present.  Cardiovascular: Normal rate, regular rhythm, normal heart sounds and intact distal pulses.  Exam reveals no gallop and no friction rub.   No murmur heard. Pulmonary/Chest: Effort normal. No respiratory distress. She has wheezes ( Speaks in full sentences but has mild-per Torrey wheezing. There is no respiratory distress, no increased work of breathing, no accessory muscle use). She has no rales.  Abdominal: Soft. Bowel sounds are normal. She exhibits no distension and no mass. There is no tenderness.  Musculoskeletal: Normal range of motion. She exhibits no edema or tenderness.  Lymphadenopathy:    She has no cervical adenopathy.  Neurological: She is alert. Coordination normal.  Skin: Skin is warm and dry. No rash noted. No erythema.  Psychiatric: She has a normal mood and affect. Her behavior is normal.  Nursing note and vitals reviewed.    ED Treatments / Results  Labs (all labs ordered are listed, but only abnormal results are displayed) Labs Reviewed - No data to display   Radiology Dg Chest 2 View  Result Date: 05/16/2016 CLINICAL DATA:  Shortness of breath with cough, congestion and chest tightness today. EXAM: CHEST  2 VIEW COMPARISON:  03/29/2016 and 09/26/2015 FINDINGS: The heart size and mediastinal contours are normal. The lungs are clear. There is no pleural effusion or pneumothorax. No acute osseous findings are identified. IMPRESSION: Stable chest.  No active cardiopulmonary process. Electronically Signed   By: Carey BullocksWilliam  Veazey M.D.   On: 05/16/2016 12:56     Procedures Procedures (including critical care time)  Medications Ordered in ED Medications  albuterol (PROVENTIL) (2.5 MG/3ML) 0.083% nebulizer solution (not administered)  albuterol (PROVENTIL) (2.5 MG/3ML) 0.083% nebulizer solution 5 mg (5 mg Nebulization Given 05/16/16 1213)     Initial Impression / Assessment and Plan / ED Course  I have reviewed the triage vital signs and the nursing notes.  Pertinent labs & imaging results that were available during my care of the patient were reviewed by me and considered in my medical decision making (see chart for details).  Clinical Course    X-rays negative, lungs have improved with albuterol, likely simple asthma exacerbation, no indication for further treatment other than prednisone, encouraged albuterol use at home and  the proper timing and dosage. She expressed her understanding and states that she does have a spacer and will use it.  Final Clinical Impressions(s) / ED Diagnoses   Final diagnoses:  Exacerbation of asthma, unspecified asthma severity, unspecified whether persistent    New Prescriptions New Prescriptions   PREDNISONE (DELTASONE) 20 MG TABLET    Take 2 tablets (40 mg total) by mouth daily.     Eber HongBrian Lathan Gieselman, MD 05/16/16 [phone]

## 2016-05-16 NOTE — Discharge Instructions (Signed)
Albuterol, use your spacer, take 2 puffs every 4 hours as needed, prednisone once a day for the next 5 days, 40 mg per dose  Please obtain all of your results from medical records or have your doctors office obtain the results - share them with your doctor - you should be seen at your doctors office in the next 2 days. Call today to arrange your follow up. Take the medications as prescribed. Please review all of the medicines and only take them if you do not have an allergy to them. Please be aware that if you are taking birth control pills, taking other prescriptions, ESPECIALLY ANTIBIOTICS may make the birth control ineffective - if this is the case, either do not engage in sexual activity or use alternative methods of birth control such as condoms until you have finished the medicine and your family doctor says it is OK to restart them. If you are on a blood thinner such as COUMADIN, be aware that any other medicine that you take may cause the coumadin to either work too much, or not enough - you should have your coumadin level rechecked in next 7 days if this is the case.  ?  It is also a possibility that you have an allergic reaction to any of the medicines that you have been prescribed - Everybody reacts differently to medications and while MOST people have no trouble with most medicines, you may have a reaction such as nausea, vomiting, rash, swelling, shortness of breath. If this is the case, please stop taking the medicine immediately and contact your physician.  ?  You should return to the ER if you develop severe or worsening symptoms.

## 2016-05-16 NOTE — ED Triage Notes (Signed)
Pt presents with c/o SOB. She woke feeling SOB. She tried drinking warm water, taking prednisone, using inhaler with no relief. She reports cough, congestion, tightness in chest. She denies fevers, body aches, n/v.

## 2016-06-02 ENCOUNTER — Emergency Department (HOSPITAL_COMMUNITY): Payer: Self-pay

## 2016-06-02 ENCOUNTER — Observation Stay (HOSPITAL_COMMUNITY): Payer: Self-pay

## 2016-06-02 ENCOUNTER — Inpatient Hospital Stay (HOSPITAL_COMMUNITY)
Admission: EM | Admit: 2016-06-02 | Discharge: 2016-06-04 | DRG: 202 | Disposition: A | Discharge status: Home / Self Care | Payer: Self-pay | Attending: Internal Medicine | Admitting: Internal Medicine

## 2016-06-02 ENCOUNTER — Encounter (HOSPITAL_COMMUNITY): Payer: Self-pay

## 2016-06-02 DIAGNOSIS — R1013 Epigastric pain: Secondary | ICD-10-CM | POA: Diagnosis present

## 2016-06-02 DIAGNOSIS — R0902 Hypoxemia: Secondary | ICD-10-CM | POA: Insufficient documentation

## 2016-06-02 DIAGNOSIS — J45901 Unspecified asthma with (acute) exacerbation: Secondary | ICD-10-CM

## 2016-06-02 DIAGNOSIS — R112 Nausea with vomiting, unspecified: Secondary | ICD-10-CM

## 2016-06-02 DIAGNOSIS — D72829 Elevated white blood cell count, unspecified: Secondary | ICD-10-CM

## 2016-06-02 DIAGNOSIS — R000 Tachycardia, unspecified: Secondary | ICD-10-CM | POA: Diagnosis present

## 2016-06-02 DIAGNOSIS — F410 Panic disorder [episodic paroxysmal anxiety] without agoraphobia: Secondary | ICD-10-CM | POA: Diagnosis present

## 2016-06-02 DIAGNOSIS — J45902 Unspecified asthma with status asthmaticus: Principal | ICD-10-CM | POA: Diagnosis present

## 2016-06-02 DIAGNOSIS — J45909 Unspecified asthma, uncomplicated: Secondary | ICD-10-CM | POA: Diagnosis present

## 2016-06-02 DIAGNOSIS — J189 Pneumonia, unspecified organism: Secondary | ICD-10-CM

## 2016-06-02 DIAGNOSIS — Z87891 Personal history of nicotine dependence: Secondary | ICD-10-CM

## 2016-06-02 DIAGNOSIS — R197 Diarrhea, unspecified: Secondary | ICD-10-CM

## 2016-06-02 LAB — CBC WITH DIFFERENTIAL/PLATELET
Basophils Absolute: 0 10*3/uL (ref 0.0–0.1)
Basophils Relative: 0 %
Eosinophils Absolute: 0 10*3/uL (ref 0.0–0.7)
Eosinophils Relative: 0 %
HCT: 38.5 % (ref 36.0–46.0)
Hemoglobin: 12.5 g/dL (ref 12.0–15.0)
Lymphocytes Relative: 2 %
Lymphs Abs: 0.4 10*3/uL — ABNORMAL LOW (ref 0.7–4.0)
MCH: 24.7 pg — ABNORMAL LOW (ref 26.0–34.0)
MCHC: 32.5 g/dL (ref 30.0–36.0)
MCV: 75.9 fL — ABNORMAL LOW (ref 78.0–100.0)
Monocytes Absolute: 0.2 10*3/uL (ref 0.1–1.0)
Monocytes Relative: 1 %
Neutro Abs: 18.2 10*3/uL — ABNORMAL HIGH (ref 1.7–7.7)
Neutrophils Relative %: 97 %
Platelets: 410 10*3/uL — ABNORMAL HIGH (ref 150–400)
RBC: 5.07 MIL/uL (ref 3.87–5.11)
RDW: 15.7 % — ABNORMAL HIGH (ref 11.5–15.5)
WBC: 18.9 10*3/uL — ABNORMAL HIGH (ref 4.0–10.5)

## 2016-06-02 LAB — COMPREHENSIVE METABOLIC PANEL
ALT: 17 U/L (ref 14–54)
AST: 31 U/L (ref 15–41)
Albumin: 4.4 g/dL (ref 3.5–5.0)
Alkaline Phosphatase: 63 U/L (ref 38–126)
Anion gap: 14 (ref 5–15)
BUN: 8 mg/dL (ref 6–20)
CO2: 18 mmol/L — ABNORMAL LOW (ref 22–32)
Calcium: 8.9 mg/dL (ref 8.9–10.3)
Chloride: 102 mmol/L (ref 101–111)
Creatinine, Ser: 0.85 mg/dL (ref 0.44–1.00)
GFR calc Af Amer: >60 mL/min (ref >60)
GFR calc non Af Amer: >60 mL/min (ref >60)
Glucose, Bld: 136 mg/dL — ABNORMAL HIGH (ref 65–99)
Potassium: 3.9 mmol/L (ref 3.5–5.1)
Sodium: 134 mmol/L — ABNORMAL LOW (ref 135–145)
Total Bilirubin: 0.5 mg/dL (ref 0.3–1.2)
Total Protein: 8.3 g/dL — ABNORMAL HIGH (ref 6.5–8.1)

## 2016-06-02 LAB — URINALYSIS, ROUTINE W REFLEX MICROSCOPIC
Bacteria, UA: NONE SEEN
Bilirubin Urine: NEGATIVE
Glucose, UA: 50 mg/dL — AB
Ketones, ur: NEGATIVE mg/dL
Leukocytes, UA: NEGATIVE
Nitrite: NEGATIVE
Protein, ur: NEGATIVE mg/dL
Specific Gravity, Urine: 1.008 (ref 1.005–1.030)
pH: 5 (ref 5.0–8.0)

## 2016-06-02 LAB — BLOOD GAS, ARTERIAL
Acid-base deficit: 12.8 mmol/L — ABNORMAL HIGH (ref 0.0–2.0)
Bicarbonate: 12.4 mmol/L — ABNORMAL LOW (ref 20.0–28.0)
Drawn by: 257701
O2 Content: 2.5 L/min
O2 Saturation: 93.8 %
Patient temperature: 98.6
pCO2 arterial: 27.3 mmHg — ABNORMAL LOW (ref 32.0–48.0)
pH, Arterial: 7.279 — ABNORMAL LOW (ref 7.350–7.450)
pO2, Arterial: 82.4 mmHg — ABNORMAL LOW (ref 83.0–108.0)

## 2016-06-02 LAB — MRSA PCR SCREENING: MRSA by PCR: NEGATIVE

## 2016-06-02 LAB — LACTIC ACID, PLASMA
Lactic Acid, Venous: 4.7 mmol/L (ref 0.5–1.9)
Lactic Acid, Venous: 2 mmol/L (ref 0.5–1.9)

## 2016-06-02 LAB — LIPASE, BLOOD: Lipase: 20 U/L (ref 11–51)

## 2016-06-02 MED ORDER — SODIUM CHLORIDE 0.9% FLUSH
3.0000 mL | Freq: Two times a day (BID) | INTRAVENOUS | Status: DC
  Administered 2016-06-02: 3 mL via INTRAVENOUS

## 2016-06-02 MED ORDER — IOPAMIDOL (ISOVUE-300) INJECTION 61%
INTRAVENOUS | Status: AC
  Filled 2016-06-02: qty 100

## 2016-06-02 MED ORDER — MAGNESIUM SULFATE 2 GM/50ML IV SOLN
2.0000 g | Freq: Once | INTRAVENOUS | Status: AC
  Administered 2016-06-02: 2 g via INTRAVENOUS
  Filled 2016-06-02: qty 50

## 2016-06-02 MED ORDER — LEVALBUTEROL HCL 0.63 MG/3ML IN NEBU
INHALATION_SOLUTION | RESPIRATORY_TRACT | Status: AC
  Administered 2016-06-02: 21:00:00
  Filled 2016-06-02: qty 3

## 2016-06-02 MED ORDER — SODIUM CHLORIDE 0.9 % IV BOLUS (SEPSIS)
1000.0000 mL | Freq: Once | INTRAVENOUS | Status: AC
  Administered 2016-06-02: 1000 mL via INTRAVENOUS

## 2016-06-02 MED ORDER — ACETAMINOPHEN 650 MG RE SUPP: 650.0000 mg | Freq: Four times a day (QID) | RECTAL | Status: DC | PRN

## 2016-06-02 MED ORDER — DEXTROSE 5 % IV SOLN
1.0000 g | INTRAVENOUS | Status: DC
  Administered 2016-06-03: 1 g via INTRAVENOUS
  Filled 2016-06-02 (×2): qty 10

## 2016-06-02 MED ORDER — GI COCKTAIL ~~LOC~~
30.0000 mL | Freq: Once | ORAL | Status: AC
  Administered 2016-06-02: 30 mL via ORAL
  Filled 2016-06-02: qty 30

## 2016-06-02 MED ORDER — LABETALOL HCL 5 MG/ML IV SOLN
10.0000 mg | INTRAVENOUS | Status: DC | PRN
Start: 2016-06-02 — End: 2016-06-04
  Administered 2016-06-02: 10 mg via INTRAVENOUS
  Filled 2016-06-02 (×2): qty 4

## 2016-06-02 MED ORDER — METHYLPREDNISOLONE SODIUM SUCC 125 MG IJ SOLR
60.0000 mg | Freq: Four times a day (QID) | INTRAMUSCULAR | Status: DC
  Administered 2016-06-02 – 2016-06-03 (×3): 60 mg via INTRAVENOUS
  Filled 2016-06-02 (×3): qty 2

## 2016-06-02 MED ORDER — DEXTROSE 5 % IV SOLN
1.0000 g | Freq: Once | INTRAVENOUS | Status: AC
  Administered 2016-06-02: 1 g via INTRAVENOUS
  Filled 2016-06-02: qty 10

## 2016-06-02 MED ORDER — DEXTROSE 5 % IV SOLN
500.0000 mg | INTRAVENOUS | Status: DC
  Administered 2016-06-03: 500 mg via INTRAVENOUS
  Filled 2016-06-02 (×2): qty 500

## 2016-06-02 MED ORDER — ENOXAPARIN SODIUM 40 MG/0.4ML ~~LOC~~ SOLN
40.0000 mg | SUBCUTANEOUS | Status: DC
  Administered 2016-06-02 – 2016-06-03 (×2): 40 mg via SUBCUTANEOUS
  Filled 2016-06-02 (×2): qty 0.4

## 2016-06-02 MED ORDER — IPRATROPIUM BROMIDE 0.02 % IN SOLN
1.0000 mg | Freq: Once | RESPIRATORY_TRACT | Status: AC
  Administered 2016-06-02: 1 mg via RESPIRATORY_TRACT
  Filled 2016-06-02: qty 5

## 2016-06-02 MED ORDER — ONDANSETRON HCL 4 MG/2ML IJ SOLN
4.0000 mg | Freq: Once | INTRAMUSCULAR | Status: AC
  Administered 2016-06-02: 4 mg via INTRAVENOUS
  Filled 2016-06-02: qty 2

## 2016-06-02 MED ORDER — ALBUTEROL (5 MG/ML) CONTINUOUS INHALATION SOLN
10.0000 mg/h | INHALATION_SOLUTION | RESPIRATORY_TRACT | Status: DC
  Administered 2016-06-02: 10 mg/h via RESPIRATORY_TRACT
  Filled 2016-06-02: qty 20

## 2016-06-02 MED ORDER — SODIUM CHLORIDE 0.9 % IJ SOLN
INTRAMUSCULAR | Status: AC
  Filled 2016-06-02: qty 50

## 2016-06-02 MED ORDER — LEVALBUTEROL HCL 0.63 MG/3ML IN NEBU
0.6300 mg | INHALATION_SOLUTION | Freq: Four times a day (QID) | RESPIRATORY_TRACT | Status: DC
  Administered 2016-06-02 – 2016-06-03 (×5): 0.63 mg via RESPIRATORY_TRACT
  Filled 2016-06-02 (×4): qty 3

## 2016-06-02 MED ORDER — IOPAMIDOL (ISOVUE-300) INJECTION 61%
INTRAVENOUS | Status: AC
  Filled 2016-06-02: qty 30

## 2016-06-02 MED ORDER — IPRATROPIUM-ALBUTEROL 0.5-2.5 (3) MG/3ML IN SOLN
3.0000 mL | RESPIRATORY_TRACT | Status: DC | PRN
  Filled 2016-06-02: qty 3

## 2016-06-02 MED ORDER — AZITHROMYCIN 500 MG IV SOLR
500.0000 mg | Freq: Once | INTRAVENOUS | Status: AC
  Administered 2016-06-02: 500 mg via INTRAVENOUS
  Filled 2016-06-02: qty 500

## 2016-06-02 MED ORDER — IPRATROPIUM-ALBUTEROL 0.5-2.5 (3) MG/3ML IN SOLN: 3.0000 mL | Freq: Four times a day (QID) | RESPIRATORY_TRACT | Status: DC

## 2016-06-02 MED ORDER — ACETAMINOPHEN 325 MG PO TABS
650.0000 mg | ORAL_TABLET | Freq: Four times a day (QID) | ORAL | Status: DC | PRN
  Filled 2016-06-02: qty 2

## 2016-06-02 MED ORDER — HYDROXYZINE HCL 50 MG/ML IM SOLN
25.0000 mg | Freq: Four times a day (QID) | INTRAMUSCULAR | Status: DC | PRN
  Administered 2016-06-02: 25 mg via INTRAMUSCULAR
  Filled 2016-06-02 (×2): qty 1

## 2016-06-02 NOTE — Progress Notes (Addendum)
CRITICAL VALUE ALERT  Critical value received:  Lactic Acid 4.7  Date of notification:  06/02/2016  Time of notification:  2040  Critical value read back:Yes.    Nurse who received alert:  Charlynn CourtBen Rennae Ferraiolo RN  MD notified (1st page):  Craige CottaKirby NP  Time of first page:  2043  Responding MD:  Craige CottaKirby NP  Time MD responded:  2056

## 2016-06-02 NOTE — ED Notes (Signed)
RT at bedside. Waiting for bed approval to take pt up.

## 2016-06-02 NOTE — H&P (Signed)
History and Physical    Andrea Daniels ZOX:[id number] DOB: 11/29/1990 DOA: 06/02/2016  PCP: Oliver BarreJames John, MD  Patient coming from: Home  Chief Complaint: Sob  HPI: Andrea Daniels is a 25 y.o. female with medical history significant of asthma, gastritis who presented with 2-3 days hx of worsening sob and wheezing. No response to albuterol inhaler. Patient called EMS and en route to ED, patient was given neb tx with 125mg  solumedrol with eventual improvement.   ED Course: In the ED, patient found to have CXR findings of faint R basilar opacity concerning for possible PNA. Patient started on empiric azithromycin and rocephin. Patient given Mg with continuous albuterol neb with continued improvement. Patient noted to be tachycardic to the 150's and required supplemental O2. hospitalist service consulted for admission.  Review of Systems:  Review of Systems  Constitutional: Negative for chills, fever and weight loss.  HENT: Negative for ear discharge, nosebleeds and tinnitus.   Eyes: Negative for double vision, photophobia and pain.  Respiratory: Positive for cough, shortness of breath and wheezing.   Cardiovascular: Negative for palpitations, orthopnea and claudication.  Gastrointestinal: Negative for melena, nausea and vomiting.  Genitourinary: Negative for frequency, hematuria and urgency.  Musculoskeletal: Negative for back pain, joint pain and neck pain.  Neurological: Negative for tingling, tremors, loss of consciousness and headaches.  Psychiatric/Behavioral: Negative for hallucinations, memory loss and substance abuse.    Past Medical History:  Diagnosis Date  . Asthma   . Gastritis   . Gastritis   . Ovarian cyst   . UTI (lower urinary tract infection)     Past Surgical History:  Procedure Laterality Date  . NO PAST SURGERIES       reports that she has quit smoking. Her smoking use included Cigarettes. She smoked 0.10 packs per day. She has never used smokeless tobacco. She  reports that she drinks alcohol. She reports that she does not use drugs.  No Known Allergies  Family History  Problem Relation Age of Onset  . Arthritis Other   . Cancer Other     breast cancer  . Hypertension Other   . Asthma Other   . Hyperlipidemia Other   . Hypertension Other   . Diabetes Other     Prior to Admission medications   Medication Sig Start Date End Date Taking? Authorizing Provider  acetaminophen (TYLENOL) 500 MG tablet Take 500 mg by mouth every 6 (six) hours as needed for mild pain.   Yes Historical Provider, MD  albuterol (PROVENTIL HFA;VENTOLIN HFA) 108 (90 Base) MCG/ACT inhaler Inhale 2 puffs into the lungs every 4 (four) hours as needed for wheezing or shortness of breath. 11/13/15  Yes Zadie Rhineonald Wickline, MD  Pseudoeph-CPM-DM-APAP (TYLENOL COLD) 30-2-15-325 MG TABS Take 2 tablets by mouth every 6 (six) hours as needed (cold symptoms).   Yes Historical Provider, MD  metroNIDAZOLE (FLAGYL) 500 MG tablet Take 1 tablet (500 mg total) by mouth 2 (two) times daily. One po bid x 7 days Patient not taking: Reported on 06/02/2016 12/27/15   Melene Planan Floyd, DO  ondansetron (ZOFRAN ODT) 4 MG disintegrating tablet 4mg  ODT q4 hours prn nausea/vomit Patient not taking: Reported on 06/02/2016 12/27/15   Melene Planan Floyd, DO  predniSONE (DELTASONE) 20 MG tablet Take 2 tablets (40 mg total) by mouth daily. Patient not taking: Reported on 06/02/2016 05/16/16   Eber HongBrian Miller, MD    Physical Exam: Vitals:   06/02/16 1356 06/02/16 1412 06/02/16 1430 06/02/16 1515  BP: 169/92  (!) 152/103 Marland Kitchen(!)  159/118  Pulse: 119  (!) 133 (!) 154  Resp: 22  21 23   SpO2: 90% 95% 99% 92%  Weight:      Height:        Constitutional: NAD, calm, comfortable Vitals:   06/02/16 1356 06/02/16 1412 06/02/16 1430 06/02/16 1515  BP: 169/92  (!) 152/103 (!) 159/118  Pulse: 119  (!) 133 (!) 154  Resp: 22  21 23   SpO2: 90% 95% 99% 92%  Weight:      Height:       Eyes: PERRL, lids and conjunctivae normal ENMT: Mucous  membranes are moist. Posterior pharynx clear of any exudate or lesions.Normal dentition.  Neck: normal, supple, no masses, no thyromegaly Respiratory: difficult to auscultate secondary to patient moaning during exam, however, no obvious wheezing heard Cardiovascular: Tachycardic, s1, s2 Abdomen: epigastric tenderness, pos BS, nondistended Musculoskeletal: no clubbing / cyanosis. No joint deformity upper and lower extremities. Good ROM, no contractures. Normal muscle tone.  Skin: no rashes, lesions, ulcers. No induration Neurologic: CN 2-12 grossly intact. Sensation intact, DTR normal. Strength 5/5 in all 4.  Psychiatric: Normal judgment and insight. Alert and oriented x 3. Normal mood.    Labs on Admission: I have personally reviewed following labs and imaging studies  CBC:  Recent Labs Lab 06/02/16 1400  WBC 18.9*  NEUTROABS 18.2*  HGB 12.5  HCT 38.5  MCV 75.9*  PLT 410*   Basic Metabolic Panel:  Recent Labs Lab 06/02/16 1400  NA 134*  K 3.9  CL 102  CO2 18*  GLUCOSE 136*  BUN 8  CREATININE 0.85  CALCIUM 8.9   GFR: Estimated Creatinine Clearance: 84.7 mL/min (by C-G formula based on SCr of 0.85 mg/dL). Liver Function Tests:  Recent Labs Lab 06/02/16 1400  AST 31  ALT 17  ALKPHOS 63  BILITOT 0.5  PROT 8.3*  ALBUMIN 4.4    Recent Labs Lab 06/02/16 1400  LIPASE 20   No results for input(s): AMMONIA in the last 168 hours. Coagulation Profile: No results for input(s): INR, PROTIME in the last 168 hours. Cardiac Enzymes: No results for input(s): CKTOTAL, CKMB, CKMBINDEX, TROPONINI in the last 168 hours. BNP (last 3 results) No results for input(s): PROBNP in the last 8760 hours. HbA1C: No results for input(s): HGBA1C in the last 72 hours. CBG: No results for input(s): GLUCAP in the last 168 hours. Lipid Profile: No results for input(s): CHOL, HDL, LDLCALC, TRIG, CHOLHDL, LDLDIRECT in the last 72 hours. Thyroid Function Tests: No results for  input(s): TSH, T4TOTAL, FREET4, T3FREE, THYROIDAB in the last 72 hours. Anemia Panel: No results for input(s): VITAMINB12, FOLATE, FERRITIN, TIBC, IRON, RETICCTPCT in the last 72 hours. Urine analysis:    Component Value Date/Time   COLORURINE STRAW (A) 06/02/2016 1457   APPEARANCEUR CLEAR 06/02/2016 1457   LABSPEC 1.008 06/02/2016 1457   PHURINE 5.0 06/02/2016 1457   GLUCOSEU 50 (A) 06/02/2016 1457   GLUCOSEU NEGATIVE 07/03/2014 0855   HGBUR SMALL (A) 06/02/2016 1457   BILIRUBINUR NEGATIVE 06/02/2016 1457   KETONESUR NEGATIVE 06/02/2016 1457   PROTEINUR NEGATIVE 06/02/2016 1457   UROBILINOGEN 0.2 11/24/2014 0736   NITRITE NEGATIVE 06/02/2016 1457   LEUKOCYTESUR NEGATIVE 06/02/2016 1457   Sepsis Labs: !!!!!!!!!!!!!!!!!!!!!!!!!!!!!!!!!!!!!!!!!!!! @LABRCNTIP (procalcitonin:4,lacticidven:4) )No results found for this or any previous visit (from the past 240 hour(s)).   Radiological Exams on Admission: Dg Chest Port 1 View  Result Date: 06/02/2016 CLINICAL DATA:  Cough. EXAM: PORTABLE CHEST 1 VIEW COMPARISON:  Radiographs of May 16, 2016.  FINDINGS: The heart size and mediastinal contours are within normal limits. Left lung is clear. Faint right basilar opacity is noted concerning for possible pneumonia. No pneumothorax or pleural effusion is noted. The visualized skeletal structures are unremarkable. IMPRESSION: Faint right basilar opacity is noted concerning for possible pneumonia. Lateral radiograph may be performed for further evaluation. Electronically Signed   By: Lupita Raider, M.D.   On: 06/02/2016 14:55    EKG: Independently reviewed. Sinus Tach  Assessment/Plan Active Problems:   Status asthmaticus   Asthma with exacerbation   Tachycardia   Leukocytosis   CAP (community acquired pneumonia)   1. Asthma exacerbation 1. Some improvement following continuous neb tx in ED with IV steroids 2. Patient remains O2 dependent. No audible wheezing, however pt is grunting and  not following commands during exam 3. Suspect asthma triggerred by CAP (see below) 4. Will continue with scheduled duonebs and IV steroids 5. Wean O2 as tolerated 6. Place obs status, med-tele 7. Of note, pt with borderline low serum bicarb. Will check ABG and lactic acid 8. At present, patient visibly agitated. Will order hydroxyzine prn anxiety 2. Possible CAP without sepsis 1. CXR with findings suggestive of R sided PNA 2. Will continue azithro and rocephin as already started in ED 3. Mildly elevated WBC. Afebrile 4. Tachycardia likely secondary to asthma exacerbation 5. Check sputum cx, legionella serology, strep pneumo serology 3. Tachycardia 1. Suspect secondary to asthma exacerbation 2. Monitor on tele 4. Leukocytosis 1. Possible secondary to CAP 2. On abx per above 3. Repeat CBC in AM 5. Epigastric pain 1. Facial grimacing over epigastric region 2. Discussed with ED. This is new finding since presentation to ED 3. Question abd wall pain secondary to dyspnea 4. Will check lactate, however will likely be elevated in the setting of tissue hypoxia from asthma 5. Will check CT abd to r/o acute intra-abdominal process  DVT prophylaxis: lovenox subq  Code Status: Full Family Communication: Pt in room  Disposition Plan: Possible d/c if asthma resolves, in 48-72hrs  Consults called:  Admission status: obs status   CHIU, Scheryl Marten MD [facility] Pager [phone]  If 7PM-7AM, please contact night-coverage [email or web address] Password [email or web address]  06/02/2016, 4:03 PM

## 2016-06-02 NOTE — ED Triage Notes (Addendum)
Per report:  Pt has hx asthma w/shob since 0700 today but has had a cold x 2 days.  Used her MDI x 2 and had a albuterol 5mg  nebulizer by [facility].  EMS also gave albuterol 5mg  and atrovent 0.5mg  that she didn't complete bc she felt like her heart was racing.  Was also given solumedrol 125mg  IV via 18g to LAC.  EMS VS: 170/118, 140-150HR.  Pt continues to feel shob. Pt also c/o low abd pain

## 2016-06-02 NOTE — ED Notes (Signed)
RT called to provide nebulizer

## 2016-06-02 NOTE — ED Notes (Signed)
Pt reporting generalized abdominal pain. Hx of gastritis. PA aware.

## 2016-06-02 NOTE — ED Provider Notes (Signed)
  Face-to-face evaluation   History: She complains of recurrent asthma attack, or fourth time this year, 3 of which she required prednisone treatment. She does not have a PCP. She does not smoke. Her only home treatment, is albuterol inhaler.  Physical exam: Alert, sleepy, uncomfortable. Lungs have decreased air movement. She is tachypneic. Heart rate is elevated 150. No dysarthria or aphasia.  Medical screening examination/treatment/procedure(s) were conducted as a shared visit with non-physician practitioner(s) and myself.  I personally evaluated the patient during the encounter   Mancel BaleElliott Levita Monical, MD 06/02/16 [phone]

## 2016-06-02 NOTE — ED Notes (Signed)
Went to collect blood - pt wouldn't really acknowledge me.  Wouldn't sit up so I could stick her arm.  RN is aware. CT about to take her.

## 2016-06-02 NOTE — ED Provider Notes (Signed)
[facility]-EMERGENCY DEPT Provider Note   CSN: [id number] Arrival date & time: 06/02/16  1136     History   Chief Complaint Chief Complaint  Patient presents with  . Shortness of Breath    HPI Andrea Daniels is a 25 y.o. female.  Andrea Daniels is a 25 y.o. Female who presents to the emergency department complaining of an asthma exacerbation. Patient is here with her friend who helps of history because of patient is feeling very short of breath. Patient reports she's been having cold symptoms for the past 2 days. She reports worsening shortness of breath over the past 2 days. She reports today she is albuterol inhaler twice and received a 5 mg nebulizer by the fire department and additional 5 mg albuterol nebulizer and half milligram of Atrovent by EMS. She was also provided with 125 mg of Solu-Medrol by EMS. Patient reports today she is also started having some abdominal pain with nausea, vomiting and diarrhea starting today.  She denies fevers, hematemesis, hematochezia, urinary symptoms, rashes or hemoptysis.   The history is provided by the patient and a friend. The history is limited by the condition of the patient. No language interpreter was used.  Shortness of Breath  Associated symptoms include cough, wheezing, vomiting and abdominal pain. Pertinent negatives include no fever, no headaches, no sore throat, no neck pain, no chest pain and no rash.    Past Medical History:  Diagnosis Date  . Asthma   . Gastritis   . Gastritis   . Ovarian cyst   . UTI (lower urinary tract infection)     Patient Active Problem List   Diagnosis Date Noted  . Tachycardia 06/02/2016  . Leukocytosis 06/02/2016  . CAP (community acquired pneumonia) 06/02/2016  . Hypophosphatemia 03/20/2015  . Low TSH level 03/20/2015  . Metabolic acidosis 03/20/2015  . Microcytic anemia 03/20/2015  . Asthma exacerbation 03/19/2015  . Elevated glucose 07/03/2014  . Asthma with exacerbation 10/22/2013  . Hives  07/11/2013  . Status asthmaticus 06/08/2013  . Allergic rhinitis 04/24/2012  . Preventative health care 08/14/2011    Past Surgical History:  Procedure Laterality Date  . NO PAST SURGERIES      OB History    Gravida Para Term Preterm AB Living   0             SAB TAB Ectopic Multiple Live Births                   Home Medications    Prior to Admission medications   Medication Sig Start Date End Date Taking? Authorizing Provider  acetaminophen (TYLENOL) 500 MG tablet Take 500 mg by mouth every 6 (six) hours as needed for mild pain.   Yes Historical Provider, MD  albuterol (PROVENTIL HFA;VENTOLIN HFA) 108 (90 Base) MCG/ACT inhaler Inhale 2 puffs into the lungs every 4 (four) hours as needed for wheezing or shortness of breath. 11/13/15  Yes Zadie Rhineonald Wickline, MD  Pseudoeph-CPM-DM-APAP (TYLENOL COLD) 30-2-15-325 MG TABS Take 2 tablets by mouth every 6 (six) hours as needed (cold symptoms).   Yes Historical Provider, MD  metroNIDAZOLE (FLAGYL) 500 MG tablet Take 1 tablet (500 mg total) by mouth 2 (two) times daily. One po bid x 7 days Patient not taking: Reported on 06/02/2016 12/27/15   Melene Planan Floyd, DO  ondansetron (ZOFRAN ODT) 4 MG disintegrating tablet 4mg  ODT q4 hours prn nausea/vomit Patient not taking: Reported on 06/02/2016 12/27/15   Melene Planan Floyd, DO  predniSONE (DELTASONE) 20  MG tablet Take 2 tablets (40 mg total) by mouth daily. Patient not taking: Reported on 06/02/2016 05/16/16   Eber Hong, MD    Family History Family History  Problem Relation Age of Onset  . Arthritis Other   . Cancer Other     breast cancer  . Hypertension Other   . Asthma Other   . Hyperlipidemia Other   . Hypertension Other   . Diabetes Other     Social History Social History  Substance Use Topics  . Smoking status: Former Smoker    Packs/day: 0.10    Types: Cigarettes  . Smokeless tobacco: Never Used  . Alcohol use Yes     Comment: occasionally     Allergies   Patient has no known  allergies.   Review of Systems Review of Systems  Constitutional: Negative for fever.  HENT: Negative for congestion and sore throat.   Eyes: Negative for visual disturbance.  Respiratory: Positive for cough, chest tightness, shortness of breath and wheezing.   Cardiovascular: Negative for chest pain and palpitations.  Gastrointestinal: Positive for abdominal pain, diarrhea, nausea and vomiting. Negative for blood in stool.  Genitourinary: Negative for difficulty urinating, dysuria, frequency and urgency.  Musculoskeletal: Negative for back pain and neck pain.  Skin: Negative for rash.  Neurological: Negative for syncope, light-headedness and headaches.     Physical Exam Updated Vital Signs BP 160/74   Pulse (!) 137   Resp (!) 29   Ht 4\' 11"  (1.499 m)   Wt 68.9 kg   LMP 05/03/2016   SpO2 92%   BMI 30.70 kg/m   Physical Exam  Constitutional: She appears well-developed and well-nourished. She appears distressed.  Patient appears uncomfortable. Increased work of breathing. Audible wheezing.  HENT:  Head: Normocephalic and atraumatic.  Mouth/Throat: Oropharynx is clear and moist.  Eyes: Conjunctivae are normal. Pupils are equal, round, and reactive to light. Right eye exhibits no discharge. Left eye exhibits no discharge.  Neck: Neck supple.  Cardiovascular: Regular rhythm, normal heart sounds and intact distal pulses.  Exam reveals no gallop and no friction rub.   No murmur heard. Pulmonary/Chest: She is in respiratory distress. She has wheezes. She has no rales.  Audible wheezing noted. Some increased work of breathing. Respirations are 28. Patient speaking in one-word sentences. Diminished lung sounds bilaterally with inspiratory and expiratory wheezing.  Abdominal: Soft. There is tenderness. There is no guarding.  Mild generalized abdominal tenderness to palpation without focal tenderness on abdominal exam.  Musculoskeletal: She exhibits no edema or tenderness.    Lymphadenopathy:    She has no cervical adenopathy.  Neurological: She is alert. Coordination normal.  Skin: Skin is warm and dry. Capillary refill takes less than 2 seconds. No rash noted. She is not diaphoretic. No erythema. No pallor.  Psychiatric: She has a normal mood and affect. Her behavior is normal.  Nursing note and vitals reviewed.    ED Treatments / Results  Labs (all labs ordered are listed, but only abnormal results are displayed) Labs Reviewed  COMPREHENSIVE METABOLIC PANEL - Abnormal; Notable for the following:       Result Value   Sodium 134 (*)    CO2 18 (*)    Glucose, Bld 136 (*)    Total Protein 8.3 (*)    All other components within normal limits  CBC WITH DIFFERENTIAL/PLATELET - Abnormal; Notable for the following:    WBC 18.9 (*)    MCV 75.9 (*)    MCH 24.7 (*)  RDW 15.7 (*)    Platelets 410 (*)    Neutro Abs 18.2 (*)    Lymphs Abs 0.4 (*)    All other components within normal limits  URINALYSIS, ROUTINE W REFLEX MICROSCOPIC - Abnormal; Notable for the following:    Color, Urine STRAW (*)    Glucose, UA 50 (*)    Hgb urine dipstick SMALL (*)    Squamous Epithelial / LPF 0-5 (*)    All other components within normal limits  LIPASE, BLOOD    EKG  EKG Interpretation  Date/Time:  Thursday June 02 2016 11:59:19 EST Ventricular Rate:  137 PR Interval:    QRS Duration: 74 QT Interval:  295 QTC Calculation: 446 R Axis:   68 Text Interpretation:  Sinus tachycardia Nonspecific repol abnormality, diffuse leads Baseline wander in lead(s) V2 Since last tracing rate faster Confirmed by Effie ShyWENTZ  MD, ELLIOTT [phone]([id number]) on 06/02/2016 2:47:03 PM       Radiology Dg Chest Port 1 View  Result Date: 06/02/2016 CLINICAL DATA:  Cough. EXAM: PORTABLE CHEST 1 VIEW COMPARISON:  Radiographs of May 16, 2016. FINDINGS: The heart size and mediastinal contours are within normal limits. Left lung is clear. Faint right basilar opacity is noted concerning for  possible pneumonia. No pneumothorax or pleural effusion is noted. The visualized skeletal structures are unremarkable. IMPRESSION: Faint right basilar opacity is noted concerning for possible pneumonia. Lateral radiograph may be performed for further evaluation. Electronically Signed   By: Lupita RaiderJames  Green Jr, M.D.   On: 06/02/2016 14:55    Procedures Procedures (including critical care time)  Medications Ordered in ED Medications  albuterol (PROVENTIL,VENTOLIN) solution continuous neb (0 mg/hr Nebulization Stopped 06/02/16 1512)  azithromycin (ZITHROMAX) 500 mg in dextrose 5 % 250 mL IVPB (500 mg Intravenous New Bag/Given 06/02/16 1609)  ipratropium (ATROVENT) nebulizer solution 1 mg (1 mg Nebulization Given 06/02/16 1412)  sodium chloride 0.9 % bolus 1,000 mL (0 mLs Intravenous Stopped 06/02/16 1535)  ondansetron (ZOFRAN) injection 4 mg (4 mg Intravenous Given 06/02/16 1410)  magnesium sulfate IVPB 2 g 50 mL (0 g Intravenous Stopped 06/02/16 1527)  cefTRIAXone (ROCEPHIN) 1 g in dextrose 5 % 50 mL IVPB (0 g Intravenous Stopped 06/02/16 1604)     Initial Impression / Assessment and Plan / ED Course  I have reviewed the triage vital signs and the nursing notes.  Pertinent labs & imaging results that were available during my care of the patient were reviewed by me and considered in my medical decision making (see chart for details).  Clinical Course    This is a 25 y.o. Female who presents to the emergency department complaining of an asthma exacerbation. Patient is here with her friend who helps of history because of patient is feeling very short of breath. Patient reports she's been having cold symptoms for the past 2 days. She reports worsening shortness of breath over the past 2 days. She reports today she is albuterol inhaler twice and received a 5 mg nebulizer by the fire department and additional 5 mg albuterol nebulizer and half milligram of Atrovent by EMS. She was also provided with 125 mg of  Solu-Medrol by EMS. Patient reports today she is also started having some abdominal pain with nausea, vomiting and diarrhea starting today. On initial exam patient appears to be in distress. She has accessory muscle use and is unable to speak in complete sentences. She has inspiratory wheezing bilaterally and diminished lung sounds bilaterally. Respiratory therapy was called for a continuous  albuterol treatment.  Patient very quickly improved after starting the treatment. Urine is nitrite and leukocyte negative. CMP is unremarkable. CBC is remarkable for white count of 18,900. Chest x-ray shows right sided infiltrate. Patient with community-acquired pneumonia. Patient has no history of intubation. She does not have a primary care doctor and only takes albuterol for her asthma. At recheck patient is still having some tachypnea and tachycardia. She has an episode of hypoxia on room air to 88%. This improves with 2 L via nasal cannula. She reports feeling much better and is able to speak in complete sentences. Will initiate treatment for community acquired pneumonia and consult for admission. Patient agrees with plan for admission.   I consulted with Dr. Rhona Leavens who accepted the patient for admission and requested temp orders for tele obs.   This patient was discussed with and evaluated by Dr. Effie Shy who agrees with assessment and plan.   Final Clinical Impressions(s) / ED Diagnoses   Final diagnoses:  Community acquired pneumonia of right lung, unspecified part of lung  Hypoxia  Exacerbation of asthma, unspecified asthma severity, unspecified whether persistent  Nausea vomiting and diarrhea   CRITICAL CARE Performed by: Lawana Chambers   Total critical care time: 40 minutes  Critical care time was exclusive of separately billable procedures and treating other patients.  Critical care was necessary to treat or prevent imminent or life-threatening deterioration.  Critical care was time  spent personally by me on the following activities: development of treatment plan with patient and/or surrogate as well as nursing, discussions with consultants, evaluation of patient's response to treatment, examination of patient, obtaining history from patient or surrogate, ordering and performing treatments and interventions, ordering and review of laboratory studies, ordering and review of radiographic studies, pulse oximetry and re-evaluation of patient's condition.  New Prescriptions New Prescriptions   No medications on file     Everlene Farrier, PA-C 06/02/16 1617    Mancel Bale, MD 06/02/16 1631    Mancel Bale, MD 06/02/16 [phone]

## 2016-06-03 ENCOUNTER — Encounter (HOSPITAL_COMMUNITY): Payer: Self-pay

## 2016-06-03 DIAGNOSIS — J189 Pneumonia, unspecified organism: Secondary | ICD-10-CM | POA: Diagnosis present

## 2016-06-03 DIAGNOSIS — R1013 Epigastric pain: Secondary | ICD-10-CM

## 2016-06-03 LAB — STREP PNEUMONIAE URINARY ANTIGEN: Strep Pneumo Urinary Antigen: NEGATIVE

## 2016-06-03 LAB — COMPREHENSIVE METABOLIC PANEL
ALT: 15 U/L (ref 14–54)
AST: 25 U/L (ref 15–41)
Albumin: 4 g/dL (ref 3.5–5.0)
Alkaline Phosphatase: 56 U/L (ref 38–126)
Anion gap: 9 (ref 5–15)
BUN: 8 mg/dL (ref 6–20)
CO2: 19 mmol/L — ABNORMAL LOW (ref 22–32)
Calcium: 8.7 mg/dL — ABNORMAL LOW (ref 8.9–10.3)
Chloride: 109 mmol/L (ref 101–111)
Creatinine, Ser: 0.74 mg/dL (ref 0.44–1.00)
GFR calc Af Amer: >60 mL/min (ref >60)
GFR calc non Af Amer: >60 mL/min (ref >60)
Glucose, Bld: 149 mg/dL — ABNORMAL HIGH (ref 65–99)
Potassium: 4.6 mmol/L (ref 3.5–5.1)
Sodium: 137 mmol/L (ref 135–145)
Total Bilirubin: 0.5 mg/dL (ref 0.3–1.2)
Total Protein: 7.7 g/dL (ref 6.5–8.1)

## 2016-06-03 LAB — CBC
HCT: 38.2 % (ref 36.0–46.0)
Hemoglobin: 12.5 g/dL (ref 12.0–15.0)
MCH: 24.7 pg — ABNORMAL LOW (ref 26.0–34.0)
MCHC: 32.7 g/dL (ref 30.0–36.0)
MCV: 75.5 fL — ABNORMAL LOW (ref 78.0–100.0)
Platelets: 375 10*3/uL (ref 150–400)
RBC: 5.06 MIL/uL (ref 3.87–5.11)
RDW: 16.2 % — ABNORMAL HIGH (ref 11.5–15.5)
WBC: 17.4 10*3/uL — ABNORMAL HIGH (ref 4.0–10.5)

## 2016-06-03 LAB — PREGNANCY, URINE: Preg Test, Ur: NEGATIVE

## 2016-06-03 LAB — HIV ANTIBODY (ROUTINE TESTING W REFLEX): HIV Screen 4th Generation wRfx: NONREACTIVE

## 2016-06-03 MED ORDER — IOPAMIDOL (ISOVUE-300) INJECTION 61%
100.0000 mL | Freq: Once | INTRAVENOUS | Status: AC | PRN
  Administered 2016-06-03: 100 mL via INTRAVENOUS

## 2016-06-03 MED ORDER — IPRATROPIUM-ALBUTEROL 0.5-2.5 (3) MG/3ML IN SOLN
3.0000 mL | Freq: Three times a day (TID) | RESPIRATORY_TRACT | Status: DC
  Administered 2016-06-04: 3 mL via RESPIRATORY_TRACT
  Filled 2016-06-03 (×2): qty 3

## 2016-06-03 MED ORDER — METHYLPREDNISOLONE SODIUM SUCC 125 MG IJ SOLR
60.0000 mg | Freq: Two times a day (BID) | INTRAMUSCULAR | Status: DC
  Administered 2016-06-03 – 2016-06-04 (×2): 60 mg via INTRAVENOUS
  Filled 2016-06-03 (×2): qty 2

## 2016-06-03 MED ORDER — HYDROXYZINE HCL 25 MG PO TABS
25.0000 mg | ORAL_TABLET | Freq: Four times a day (QID) | ORAL | Status: DC | PRN
  Administered 2016-06-03: 25 mg via ORAL
  Filled 2016-06-03: qty 1

## 2016-06-03 MED ORDER — SODIUM CHLORIDE 0.9 % IJ SOLN
INTRAMUSCULAR | Status: AC
  Administered 2016-06-03: 02:00:00
  Filled 2016-06-03: qty 50

## 2016-06-03 MED ORDER — IOPAMIDOL (ISOVUE-300) INJECTION 61%
INTRAVENOUS | Status: AC
  Administered 2016-06-03: 02:00:00
  Filled 2016-06-03: qty 100

## 2016-06-03 NOTE — Progress Notes (Signed)
CRITICAL VALUE ALERT  Critical value received:  Lactic 2.0  Date of notification:  06/03/2016  Time of notification:  0005  Critical value read back:Yes.    Nurse who received alert:  Charlynn CourtBen Sennie Borden RN  MD notified (1st page):  Craige CottaKirby NP  Time of first page:  0005  Responding MD:  Craige CottaKirby NP  Time MD responded:

## 2016-06-03 NOTE — Progress Notes (Signed)
PROGRESS NOTE    Andrea Daniels  ZOX:[id number] DOB: 01/23/1991 DOA: 06/02/2016 PCP: Oliver BarreJames John, MD    Brief Narrative:  25 y.o. female with medical history significant of asthma, gastritis who presented with 2-3 days hx of worsening sob and wheezing. No response to albuterol inhaler. Patient called EMS and en route to ED, patient was given neb tx with 125mg  solumedrol with eventual improvement. In the ED, patient found to have CXR findings of faint R basilar opacity concerning for possible PNA. Patient started on empiric azithromycin and rocephin. Patient given Mg with continuous albuterol neb with continued improvement. Patient noted to be tachycardic to the 150's and required supplemental O2. hospitalist service consulted for admission.  Assessment & Plan:   Active Problems:   Status asthmaticus   Asthma with exacerbation   Tachycardia   Leukocytosis   Community acquired pneumonia of right lung   Asthma   1. Asthma exacerbation 1. Some improvement following continuous neb tx in ED with IV steroids 2. Patient remains O2 dependent. Appears improved overnight, still some wheezing 3. Suspect asthma triggerred by CAP (see below) 4. Will continue with scheduled duonebs and IV steroids 5. Labs reviewed. Lactate elevated - likely from hypoxia, improving 6. Wean IV steroids to Q12hr dosing 2. Possible CAP without sepsis 1. CXR with findings suggestive of R sided PNA 2. Patient is continued on azithro and rocephin as already started in ED 3. Mildly elevated WBC, improved overnight. Remains afebrile 4. Presenting tachycardia likely secondary to asthma exacerbation and anxiety/panic attack 5. Check sputum cx pending, legionella serology pending, strep pneumo serology neg, MRSA  3. Tachycardia 1. Suspect secondary to asthma exacerbation with anxiety/panic attack 2. Improved overnight 3. On PRN vistaril  4. Leukocytosis 1. Possible secondary to CAP 2. On abx per above 3. WBC improved  overnight 4. Afebrile 5. Will expect mild leukocytosis given current steroid use per above 5. Epigastric pain 1. Facial grimacing over epigastric region 2. Discussed with ED. This is new finding since presentation to ED 3. Question abd wall pain secondary to dyspnea 4. CT abd unremarkable 5. Seems improved this AM  DVT prophylaxis: Lovenox subQ Code Status: Full Family Communication: Pt in room Disposition Plan: Possible home in 24-48hrs  Consultants:     Procedures:     Antimicrobials: Anti-infectives    Start     Dose/Rate Route Frequency Ordered Stop   06/03/16 1700  cefTRIAXone (ROCEPHIN) 1 g in dextrose 5 % 50 mL IVPB     1 g 100 mL/hr over 30 Minutes Intravenous Every 24 hours 06/02/16 1658 06/09/16 1659   06/03/16 1600  azithromycin (ZITHROMAX) 500 mg in dextrose 5 % 250 mL IVPB     500 mg 250 mL/hr over 60 Minutes Intravenous Every 24 hours 06/02/16 1658 06/09/16 1559   06/02/16 1515  cefTRIAXone (ROCEPHIN) 1 g in dextrose 5 % 50 mL IVPB     1 g 100 mL/hr over 30 Minutes Intravenous  Once 06/02/16 1501 06/02/16 1604   06/02/16 1515  azithromycin (ZITHROMAX) 500 mg in dextrose 5 % 250 mL IVPB     500 mg 250 mL/hr over 60 Minutes Intravenous  Once 06/02/16 1501 06/02/16 1709       Subjective: Reports feeling better today  Objective: Vitals:   06/03/16 0400 06/03/16 0500 06/03/16 0600 06/03/16 0800  BP: (!) 140/48 128/61 112/65   Pulse:      Resp:      Temp:    98 F (36.7 C)  TempSrc:    Oral  SpO2: 94% 92% 92%   Weight:      Height:        Intake/Output Summary (Last 24 hours) at 06/03/16 0850 Last data filed at 06/03/16 0200  Gross per 24 hour  Intake             2150 ml  Output             1450 ml  Net              700 ml   Filed Weights   06/02/16 1203  Weight: 68.9 kg (152 lb)    Examination:  General exam: Appears calm and comfortable  Respiratory system: end-expiratory wheezing B. Respiratory effort normal. Cardiovascular system:  S1 & S2 heard, RRR.  Gastrointestinal system: Abdomen is nondistended, soft and nontender. No organomegaly or masses felt. Normal bowel sounds heard. Central nervous system: Alert and oriented. No focal neurological deficits. Extremities: Symmetric 5 x 5 power. Skin: No rashes, lesions  Psychiatry: Judgement and insight appear normal. Mood & affect appropriate.   Data Reviewed: I have personally reviewed following labs and imaging studies  CBC:  Recent Labs Lab 06/02/16 1400 06/03/16 0332  WBC 18.9* 17.4*  NEUTROABS 18.2*  --   HGB 12.5 12.5  HCT 38.5 38.2  MCV 75.9* 75.5*  PLT 410* 375   Basic Metabolic Panel:  Recent Labs Lab 06/02/16 1400 06/03/16 0332  NA 134* 137  K 3.9 4.6  CL 102 109  CO2 18* 19*  GLUCOSE 136* 149*  BUN 8 8  CREATININE 0.85 0.74  CALCIUM 8.9 8.7*   GFR: Estimated Creatinine Clearance: 90 mL/min (by C-G formula based on SCr of 0.74 mg/dL). Liver Function Tests:  Recent Labs Lab 06/02/16 1400 06/03/16 0332  AST 31 25  ALT 17 15  ALKPHOS 63 56  BILITOT 0.5 0.5  PROT 8.3* 7.7  ALBUMIN 4.4 4.0    Recent Labs Lab 06/02/16 1400  LIPASE 20   No results for input(s): AMMONIA in the last 168 hours. Coagulation Profile: No results for input(s): INR, PROTIME in the last 168 hours. Cardiac Enzymes: No results for input(s): CKTOTAL, CKMB, CKMBINDEX, TROPONINI in the last 168 hours. BNP (last 3 results) No results for input(s): PROBNP in the last 8760 hours. HbA1C: No results for input(s): HGBA1C in the last 72 hours. CBG: No results for input(s): GLUCAP in the last 168 hours. Lipid Profile: No results for input(s): CHOL, HDL, LDLCALC, TRIG, CHOLHDL, LDLDIRECT in the last 72 hours. Thyroid Function Tests: No results for input(s): TSH, T4TOTAL, FREET4, T3FREE, THYROIDAB in the last 72 hours. Anemia Panel: No results for input(s): VITAMINB12, FOLATE, FERRITIN, TIBC, IRON, RETICCTPCT in the last 72 hours. Sepsis Labs:  Recent  Labs Lab 06/02/16 1944 06/02/16 2304  LATICACIDVEN 4.7* 2.0*    Recent Results (from the past 240 hour(s))  MRSA PCR Screening     Status: None   Collection Time: 06/02/16  6:30 PM  Result Value Ref Range Status   MRSA by PCR NEGATIVE NEGATIVE Final    Comment:        The GeneXpert MRSA Assay (FDA approved for NASAL specimens only), is one component of a comprehensive MRSA colonization surveillance program. It is not intended to diagnose MRSA infection nor to guide or monitor treatment for MRSA infections.      Radiology Studies: Ct Abdomen Pelvis W Contrast  Result Date: 06/03/2016 CLINICAL DATA:  Epigastric abdominal pain. EXAM: CT ABDOMEN AND  PELVIS WITH CONTRAST TECHNIQUE: Multidetector CT imaging of the abdomen and pelvis was performed using the standard protocol following bolus administration of intravenous contrast. CONTRAST:  ISOVUE-300 IOPAMIDOL (ISOVUE-300) INJECTION 61% COMPARISON:  Pelvic ultrasound 11/24/2014, additional priors. FINDINGS: Lower chest: Ground-glass and consolidative right middle lobe opacity is partially included. There is a peripheral focal consolidation in the left lower lobe. No pleural fluid. Hepatobiliary: No focal liver abnormality is seen. No gallstones, gallbladder wall thickening, or biliary dilatation. Pancreas: Unremarkable. No pancreatic ductal dilatation or surrounding inflammatory changes. Spleen: Normal in size without focal abnormality. Small splenule anteriorly. Adrenals/Urinary Tract: No adrenal nodule. Homogeneous enhancement of both kidneys without hydronephrosis or focal abnormality. The bladder is distended without wall thickening. Stomach/Bowel: Stomach is decompressed. No small bowel distention or inflammation. Distal transverse and descending colon are decompressed, difficult to exclude wall thickening. No surrounding inflammation. Normal appendix. Vascular/Lymphatic: No significant vascular findings are present. No enlarged  abdominal or pelvic lymph nodes. Small retroperitoneal nodes not enlarged by size criteria. Reproductive: Endometrium appears thickened measuring up to 19 mm. Ill-defined hypodensity within the uterine fundus. Left adnexal ovoid 5.3 x 2.7 cm cystic density, unchanged from prior pelvic ultrasound allowing for differences in modality. Right ovary is normal in size. There is prominent periuterine and adnexal vascularity. Other: Small amount of pelvic free fluid likely physiologic. Tiny fat containing umbilical hernia. No free air. Musculoskeletal: There are no acute or suspicious osseous abnormalities. IMPRESSION: 1. Lung base findings consistent with pneumonia in the right middle lobe and to lesser extent left lower lobe. 2. Decompressed distal transverse and descending colon, difficult to exclude wall thickening and mild colitis. 3. Apparent endometrial thickening, suboptimally assessed by CT. Recommend nonemergent pelvic ultrasound characterization. Cystic structure in the left adnexa consistent with hydrosalpinx, chronic based on prior pelvic ultrasounds. 4. Mild urinary bladder distention without inflammation. Electronically Signed   By: Rubye Oaks M.D.   On: 06/03/2016 01:39   Dg Chest Port 1 View  Result Date: 06/02/2016 CLINICAL DATA:  Cough. EXAM: PORTABLE CHEST 1 VIEW COMPARISON:  Radiographs of May 16, 2016. FINDINGS: The heart size and mediastinal contours are within normal limits. Left lung is clear. Faint right basilar opacity is noted concerning for possible pneumonia. No pneumothorax or pleural effusion is noted. The visualized skeletal structures are unremarkable. IMPRESSION: Faint right basilar opacity is noted concerning for possible pneumonia. Lateral radiograph may be performed for further evaluation. Electronically Signed   By: Lupita Raider, M.D.   On: 06/02/2016 14:55    Scheduled Meds: . azithromycin  500 mg Intravenous Q24H  . cefTRIAXone (ROCEPHIN)  IV  1 g Intravenous  Q24H  . enoxaparin (LOVENOX) injection  40 mg Subcutaneous Q24H  . levalbuterol  0.63 mg Nebulization Q6H  . methylPREDNISolone (SOLU-MEDROL) injection  60 mg Intravenous Q12H  . sodium chloride flush  3 mL Intravenous Q12H   Continuous Infusions:   LOS: 0 days   CHIU, Scheryl Marten, MD [facility] Pager [phone]  If 7PM-7AM, please contact night-coverage [email or web address] Password [email or web address] 06/03/2016, 8:50 AM

## 2016-06-04 DIAGNOSIS — J4521 Mild intermittent asthma with (acute) exacerbation: Secondary | ICD-10-CM

## 2016-06-04 DIAGNOSIS — R000 Tachycardia, unspecified: Secondary | ICD-10-CM

## 2016-06-04 MED ORDER — CEFUROXIME AXETIL 250 MG PO TABS: 250.0000 mg | ORAL_TABLET | Freq: Two times a day (BID) | ORAL | 0 refills | Status: DC

## 2016-06-04 MED ORDER — PREDNISONE 5 MG PO TABS: 5.0000 mg | ORAL_TABLET | Freq: Every day | ORAL | Status: DC

## 2016-06-04 MED ORDER — AZITHROMYCIN 250 MG PO TABS: ORAL_TABLET | ORAL | 0 refills | Status: DC

## 2016-06-04 NOTE — Discharge Summary (Signed)
Physician Discharge Summary  Andrea Daniels [id number] DOB: 06/19/1991 DOA: 06/02/2016  PCP: Oliver BarreJames John, MD  Admit date: 06/02/2016 Discharge date: 06/04/2016  Admitted From: Home Disposition:  Home  Recommendations for Outpatient Follow-up:  1. Follow up with PCP in 2-3 weeks   Discharge Condition:Improved CODE STATUS:Full Diet recommendation: Heart healthy   Brief/Interim Summary: 25 y.o.femalewith medical history significant of asthma, gastritis who presented with 2-3 days hx of worsening sob and wheezing. No response to albuterol inhaler. Patient called EMS and en route to ED, patient was given neb tx with 125mg  solumedrol with eventual improvement. In the ED, patient found to have CXR findings of faint R basilar opacity concerning for possible PNA. Patient started on empiric azithromycin and rocephin. Patient given Mg with continuous albuterol neb with continued improvement. Patient noted to be tachycardic to the 150's and required supplemental O2. hospitalist service consulted for admission.  1. Asthma exacerbation 1. Improved with scheduled neb tx and steroids 2. Suspect asthma triggerred by CAP (see below) with associated anxiety/panic attack, supported by hyperventilation on ABG 3. Will discharge with prednisone taper. Pt reports already having albuterol MDI at home from recent ED visit 2. Possible CAP without sepsis 1. CXR with findings suggestive of R sided PNA 2. Patient was continued on azithro and rocephin as already started in ED 3. Mildly elevated WBC. Remains afebrile 4. Presenting tachycardia likely secondary to asthma exacerbation and anxiety/panic attack 3. Tachycardia 1. Suspect secondary to asthma exacerbation with anxiety/panic attack 2. Improved this admission 3. On PRN vistaril while admitted 4. Leukocytosis 1. Possible secondary to CAP 2. On abx per above, would transition to PO ceftin and azithromycin 3. Remains Afebrile 4. Will expect mild  leukocytosis given current steroid use per above 5. Epigastric pain 1. Facial grimacing over epigastric region 2. Discussed with ED. This is new finding since presentation to ED 3. Suspect abd wall pain secondary to dyspnea/hyperventillation  4. CT abd unremarkable 5. Resolved  Discharge Diagnoses:  Active Problems:   Status asthmaticus   Asthma with exacerbation   Tachycardia   Leukocytosis   Community acquired pneumonia of right lung   Asthma   Epigastric abdominal pain   CAP (community acquired pneumonia)    Discharge Instructions     Medication List    STOP taking these medications   metroNIDAZOLE 500 MG tablet Commonly known as:  FLAGYL   ondansetron 4 MG disintegrating tablet Commonly known as:  ZOFRAN ODT     TAKE these medications   acetaminophen 500 MG tablet Commonly known as:  TYLENOL Take 500 mg by mouth every 6 (six) hours as needed for mild pain.   albuterol 108 (90 Base) MCG/ACT inhaler Commonly known as:  PROVENTIL HFA;VENTOLIN HFA Inhale 2 puffs into the lungs every 4 (four) hours as needed for wheezing or shortness of breath.   azithromycin 250 MG tablet Commonly known as:  ZITHROMAX 1 tab po qday x 3 more days, zero refills   cefUROXime 250 MG tablet Commonly known as:  CEFTIN Take 1 tablet (250 mg total) by mouth 2 (two) times daily with a meal.   predniSONE 5 MG tablet Commonly known as:  DELTASONE Take 1 tablet (5 mg total) by mouth daily with breakfast. What changed:  medication strength  how much to take  when to take this   TYLENOL COLD 30-2-15-325 MG Tabs Generic drug:  Pseudoeph-CPM-DM-APAP Take 2 tablets by mouth every 6 (six) hours as needed (cold symptoms).      Follow-up  Information    Oliver Barre, MD. Schedule an appointment as soon as possible for a visit in 2 week(s).   Specialties:  Internal Medicine, Radiology Contact information: [address] [place] [facility] [place] [address] [phone]          No  Known Allergies    Procedures/Studies: Dg Chest 2 View  Result Date: 05/16/2016 CLINICAL DATA:  Shortness of breath with cough, congestion and chest tightness today. EXAM: CHEST  2 VIEW COMPARISON:  03/29/2016 and 09/26/2015 FINDINGS: The heart size and mediastinal contours are normal. The lungs are clear. There is no pleural effusion or pneumothorax. No acute osseous findings are identified. IMPRESSION: Stable chest.  No active cardiopulmonary process. Electronically Signed   By: Andrea Bullocks M.D.   On: 05/16/2016 12:56   Ct Abdomen Pelvis W Contrast  Result Date: 06/03/2016 CLINICAL DATA:  Epigastric abdominal pain. EXAM: CT ABDOMEN AND PELVIS WITH CONTRAST TECHNIQUE: Multidetector CT imaging of the abdomen and pelvis was performed using the standard protocol following bolus administration of intravenous contrast. CONTRAST:  ISOVUE-300 IOPAMIDOL (ISOVUE-300) INJECTION 61% COMPARISON:  Pelvic ultrasound 11/24/2014, additional priors. FINDINGS: Lower chest: Ground-glass and consolidative right middle lobe opacity is partially included. There is a peripheral focal consolidation in the left lower lobe. No pleural fluid. Hepatobiliary: No focal liver abnormality is seen. No gallstones, gallbladder wall thickening, or biliary dilatation. Pancreas: Unremarkable. No pancreatic ductal dilatation or surrounding inflammatory changes. Spleen: Normal in size without focal abnormality. Small splenule anteriorly. Adrenals/Urinary Tract: No adrenal nodule. Homogeneous enhancement of both kidneys without hydronephrosis or focal abnormality. The bladder is distended without wall thickening. Stomach/Bowel: Stomach is decompressed. No small bowel distention or inflammation. Distal transverse and descending colon are decompressed, difficult to exclude wall thickening. No surrounding inflammation. Normal appendix. Vascular/Lymphatic: No significant vascular findings are present. No enlarged abdominal or pelvic  lymph nodes. Small retroperitoneal nodes not enlarged by size criteria. Reproductive: Endometrium appears thickened measuring up to 19 mm. Ill-defined hypodensity within the uterine fundus. Left adnexal ovoid 5.3 x 2.7 cm cystic density, unchanged from prior pelvic ultrasound allowing for differences in modality. Right ovary is normal in size. There is prominent periuterine and adnexal vascularity. Other: Small amount of pelvic free fluid likely physiologic. Tiny fat containing umbilical hernia. No free air. Musculoskeletal: There are no acute or suspicious osseous abnormalities. IMPRESSION: 1. Lung base findings consistent with pneumonia in the right middle lobe and to lesser extent left lower lobe. 2. Decompressed distal transverse and descending colon, difficult to exclude wall thickening and mild colitis. 3. Apparent endometrial thickening, suboptimally assessed by CT. Recommend nonemergent pelvic ultrasound characterization. Cystic structure in the left adnexa consistent with hydrosalpinx, chronic based on prior pelvic ultrasounds. 4. Mild urinary bladder distention without inflammation. Electronically Signed   By: Rubye Oaks M.D.   On: 06/03/2016 01:39   Dg Chest Port 1 View  Result Date: 06/02/2016 CLINICAL DATA:  Cough. EXAM: PORTABLE CHEST 1 VIEW COMPARISON:  Radiographs of May 16, 2016. FINDINGS: The heart size and mediastinal contours are within normal limits. Left lung is clear. Faint right basilar opacity is noted concerning for possible pneumonia. No pneumothorax or pleural effusion is noted. The visualized skeletal structures are unremarkable. IMPRESSION: Faint right basilar opacity is noted concerning for possible pneumonia. Lateral radiograph may be performed for further evaluation. Electronically Signed   By: Lupita Raider, M.D.   On: 06/02/2016 14:55    Subjective: Feels better today. In good spirits  Discharge Exam: Vitals:  06/03/16 2220 06/04/16 0553  BP: 134/75  125/86  Pulse: 90 74  Resp: 18 18  Temp: 99 F (37.2 C) 98.5 F (36.9 C)   Vitals:   06/03/16 2024 06/03/16 2220 06/04/16 0553 06/04/16 0912  BP:  134/75 125/86   Pulse:  90 74   Resp:  18 18   Temp:  99 F (37.2 C) 98.5 F (36.9 C)   TempSrc:  Oral Oral   SpO2: 95% 99% 98% 98%  Weight:      Height:        General: Pt is alert, awake, not in acute distress Cardiovascular: RRR, S1/S2 +, no rubs, no gallops Respiratory: CTA bilaterally, mild end-expiratory wheezing mainly on R, no rhonchi Abdominal: Soft, NT, ND, bowel sounds + Extremities: no edema, no cyanosis   The results of significant diagnostics from this hospitalization (including imaging, microbiology, ancillary and laboratory) are listed below for reference.     Microbiology: Recent Results (from the past 240 hour(s))  MRSA PCR Screening     Status: None   Collection Time: 06/02/16  6:30 PM  Result Value Ref Range Status   MRSA by PCR NEGATIVE NEGATIVE Final    Comment:        The GeneXpert MRSA Assay (FDA approved for NASAL specimens only), is one component of a comprehensive MRSA colonization surveillance program. It is not intended to diagnose MRSA infection nor to guide or monitor treatment for MRSA infections.   Culture, blood (routine x 2) Call MD if unable to obtain prior to antibiotics being given     Status: None (Preliminary result)   Collection Time: 06/02/16  7:44 PM  Result Value Ref Range Status   Specimen Description BLOOD RIGHT ARM  Final   Special Requests IN PEDIATRIC BOTTLE 2CC  Final   Culture   Final    NO GROWTH < 12 HOURS Performed at [facility]    Report Status PENDING  Incomplete  Culture, blood (routine x 2) Call MD if unable to obtain prior to antibiotics being given     Status: None (Preliminary result)   Collection Time: 06/02/16  7:44 PM  Result Value Ref Range Status   Specimen Description BLOOD LEFT HAND  Final   Special Requests IN PEDIATRIC BOTTLE 2CC   Final   Culture   Final    NO GROWTH < 12 HOURS Performed at [facility]    Report Status PENDING  Incomplete     Labs: BNP (last 3 results) No results for input(s): BNP in the last 8760 hours. Basic Metabolic Panel:  Recent Labs Lab 06/02/16 1400 06/03/16 0332  NA 134* 137  K 3.9 4.6  CL 102 109  CO2 18* 19*  GLUCOSE 136* 149*  BUN 8 8  CREATININE 0.85 0.74  CALCIUM 8.9 8.7*   Liver Function Tests:  Recent Labs Lab 06/02/16 1400 06/03/16 0332  AST 31 25  ALT 17 15  ALKPHOS 63 56  BILITOT 0.5 0.5  PROT 8.3* 7.7  ALBUMIN 4.4 4.0    Recent Labs Lab 06/02/16 1400  LIPASE 20   No results for input(s): AMMONIA in the last 168 hours. CBC:  Recent Labs Lab 06/02/16 1400 06/03/16 0332  WBC 18.9* 17.4*  NEUTROABS 18.2*  --   HGB 12.5 12.5  HCT 38.5 38.2  MCV 75.9* 75.5*  PLT 410* 375   Cardiac Enzymes: No results for input(s): CKTOTAL, CKMB, CKMBINDEX, TROPONINI in the last 168 hours. BNP: Invalid input(s): POCBNP  CBG: No results for input(s): GLUCAP in the last 168 hours. D-Dimer No results for input(s): DDIMER in the last 72 hours. Hgb A1c No results for input(s): HGBA1C in the last 72 hours. Lipid Profile No results for input(s): CHOL, HDL, LDLCALC, TRIG, CHOLHDL, LDLDIRECT in the last 72 hours. Thyroid function studies No results for input(s): TSH, T4TOTAL, T3FREE, THYROIDAB in the last 72 hours.  Invalid input(s): FREET3 Anemia work up No results for input(s): VITAMINB12, FOLATE, FERRITIN, TIBC, IRON, RETICCTPCT in the last 72 hours. Urinalysis    Component Value Date/Time   COLORURINE STRAW (A) 06/02/2016 1457   APPEARANCEUR CLEAR 06/02/2016 1457   LABSPEC 1.008 06/02/2016 1457   PHURINE 5.0 06/02/2016 1457   GLUCOSEU 50 (A) 06/02/2016 1457   GLUCOSEU NEGATIVE 07/03/2014 0855   HGBUR SMALL (A) 06/02/2016 1457   BILIRUBINUR NEGATIVE 06/02/2016 1457   KETONESUR NEGATIVE 06/02/2016 1457   PROTEINUR NEGATIVE 06/02/2016 1457    UROBILINOGEN 0.2 11/24/2014 0736   NITRITE NEGATIVE 06/02/2016 1457   LEUKOCYTESUR NEGATIVE 06/02/2016 1457   Sepsis Labs Invalid input(s): PROCALCITONIN,  WBC,  LACTICIDVEN Microbiology Recent Results (from the past 240 hour(s))  MRSA PCR Screening     Status: None   Collection Time: 06/02/16  6:30 PM  Result Value Ref Range Status   MRSA by PCR NEGATIVE NEGATIVE Final    Comment:        The GeneXpert MRSA Assay (FDA approved for NASAL specimens only), is one component of a comprehensive MRSA colonization surveillance program. It is not intended to diagnose MRSA infection nor to guide or monitor treatment for MRSA infections.   Culture, blood (routine x 2) Call MD if unable to obtain prior to antibiotics being given     Status: None (Preliminary result)   Collection Time: 06/02/16  7:44 PM  Result Value Ref Range Status   Specimen Description BLOOD RIGHT ARM  Final   Special Requests IN PEDIATRIC BOTTLE 2CC  Final   Culture   Final    NO GROWTH < 12 HOURS [facility]    Report Status PENDING  Incomplete  Culture, blood (routine x 2) Call MD if unable to obtain prior to antibiotics being given     Status: None (Preliminary result)   Collection Time: 06/02/16  7:44 PM  Result Value Ref Range Status   Specimen Description BLOOD LEFT HAND  Final   Special Requests IN PEDIATRIC BOTTLE 2CC  Final   Culture   Final    NO GROWTH < 12 HOURS [facility]formed at [facility]    Report Status PENDING  Incomplete     SIGNED:   Jerald KiefHIU, Irfan Veal K, MD  [facility] 06/04/2016, 11:19 AM  If 7PM-7AM, please contact night-coverage [email or web address] Password [email or web address]

## 2016-06-04 NOTE — Progress Notes (Signed)
Spoke to patient gave her the information regarding acquiring a PCP [facility] [phone].  Patient had no other CM needs at this time.  Raymond GurneyL. J. Georganna Maxson RN, BSN, CM

## 2016-06-04 NOTE — Progress Notes (Signed)
Respiratory gave pt Flutter Valve and instructed on use. Case Manager provided pt with number to call to set up appt with a PCP. Other discharge instructions given to patient. Melton Alarana A Joandry Slagter, RN

## 2016-06-07 LAB — CULTURE, BLOOD (ROUTINE X 2)
Culture: NO GROWTH
Culture: NO GROWTH

## 2016-07-20 ENCOUNTER — Emergency Department (HOSPITAL_COMMUNITY)
Admission: EM | Admit: 2016-07-20 | Discharge: 2016-07-20 | Disposition: A | Discharge status: Home / Self Care | Payer: 59 | Attending: Emergency Medicine | Admitting: Emergency Medicine

## 2016-07-20 ENCOUNTER — Encounter (HOSPITAL_COMMUNITY): Payer: Self-pay | Admitting: Emergency Medicine

## 2016-07-20 DIAGNOSIS — J45909 Unspecified asthma, uncomplicated: Secondary | ICD-10-CM | POA: Insufficient documentation

## 2016-07-20 DIAGNOSIS — Z87891 Personal history of nicotine dependence: Secondary | ICD-10-CM | POA: Insufficient documentation

## 2016-07-20 DIAGNOSIS — R1012 Left upper quadrant pain: Secondary | ICD-10-CM | POA: Insufficient documentation

## 2016-07-20 LAB — I-STAT BETA HCG BLOOD, ED (MC, WL, AP ONLY): I-stat hCG, quantitative: <5 m[IU]/mL (ref <5)

## 2016-07-20 LAB — COMPREHENSIVE METABOLIC PANEL
ALT: 15 U/L (ref 14–54)
AST: 21 U/L (ref 15–41)
Albumin: 4.3 g/dL (ref 3.5–5.0)
Alkaline Phosphatase: 62 U/L (ref 38–126)
Anion gap: 8 (ref 5–15)
BUN: 9 mg/dL (ref 6–20)
CO2: 23 mmol/L (ref 22–32)
Calcium: 8.4 mg/dL — ABNORMAL LOW (ref 8.9–10.3)
Chloride: 106 mmol/L (ref 101–111)
Creatinine, Ser: 0.79 mg/dL (ref 0.44–1.00)
GFR calc Af Amer: >60 mL/min (ref >60)
GFR calc non Af Amer: >60 mL/min (ref >60)
Glucose, Bld: 106 mg/dL — ABNORMAL HIGH (ref 65–99)
Potassium: 3.9 mmol/L (ref 3.5–5.1)
Sodium: 137 mmol/L (ref 135–145)
Total Bilirubin: 0.5 mg/dL (ref 0.3–1.2)
Total Protein: 7.6 g/dL (ref 6.5–8.1)

## 2016-07-20 LAB — LIPASE, BLOOD: Lipase: 37 U/L (ref 11–51)

## 2016-07-20 LAB — CBC
HCT: 39.6 % (ref 36.0–46.0)
Hemoglobin: 13.2 g/dL (ref 12.0–15.0)
MCH: 25.2 pg — ABNORMAL LOW (ref 26.0–34.0)
MCHC: 33.3 g/dL (ref 30.0–36.0)
MCV: 75.6 fL — ABNORMAL LOW (ref 78.0–100.0)
Platelets: 415 10*3/uL — ABNORMAL HIGH (ref 150–400)
RBC: 5.24 MIL/uL — ABNORMAL HIGH (ref 3.87–5.11)
RDW: 14.8 % (ref 11.5–15.5)
WBC: 8.1 10*3/uL (ref 4.0–10.5)

## 2016-07-20 LAB — URINALYSIS, ROUTINE W REFLEX MICROSCOPIC
Bacteria, UA: NONE SEEN
Bilirubin Urine: NEGATIVE
Glucose, UA: NEGATIVE mg/dL
Ketones, ur: NEGATIVE mg/dL
Nitrite: NEGATIVE
Protein, ur: 100 mg/dL — AB
Specific Gravity, Urine: 1.021 (ref 1.005–1.030)
pH: 9 — ABNORMAL HIGH (ref 5.0–8.0)

## 2016-07-20 MED ORDER — ONDANSETRON 4 MG PO TBDP
4.0000 mg | ORAL_TABLET | Freq: Once | ORAL | Status: AC
  Administered 2016-07-20: 4 mg via ORAL
  Filled 2016-07-20: qty 1

## 2016-07-20 MED ORDER — GI COCKTAIL ~~LOC~~
30.0000 mL | Freq: Once | ORAL | Status: AC
  Administered 2016-07-20: 30 mL via ORAL
  Filled 2016-07-20: qty 30

## 2016-07-20 NOTE — ED Triage Notes (Signed)
Per EMS-abdominal pain that started 30 minutes ago-nausea, no vomiting

## 2016-07-20 NOTE — ED Provider Notes (Signed)
[facility]-EMERGENCY DEPT Provider Note   CSN: [id number] Arrival date & time: 07/20/16  1001     History   Chief Complaint Chief Complaint  Patient presents with  . Abdominal Pain    HPI   Blood pressure 146/93, pulse 78, temperature 97.8 F (36.6 C), temperature source Oral, resp. rate 16, height 4\' 11"  (1.499 m), weight 68.9 kg, SpO2 99 %.  Andrea Daniels is a 26 y.o. female complaining of acute onset of 3 out of 10 left upper quadrant pain described as colicky onset 30 minutes prior to arrival when she called EMS. She has nausea with no associated vomiting, vaginal discharge, dysuria, hematuria, urinary frequency or flank pain. Patient is menstruating.  Past Medical History:  Diagnosis Date  . Asthma   . Gastritis   . Gastritis   . Ovarian cyst   . UTI (lower urinary tract infection)     Patient Active Problem List   Diagnosis Date Noted  . CAP (community acquired pneumonia) 06/03/2016  . Epigastric abdominal pain   . Tachycardia 06/02/2016  . Leukocytosis 06/02/2016  . Community acquired pneumonia of right lung 06/02/2016  . Asthma 06/02/2016  . Hypoxia   . Hypophosphatemia 03/20/2015  . Low TSH level 03/20/2015  . Metabolic acidosis 03/20/2015  . Microcytic anemia 03/20/2015  . Exacerbation of asthma 03/19/2015  . Elevated glucose 07/03/2014  . Asthma with exacerbation 10/22/2013  . Hives 07/11/2013  . Status asthmaticus 06/08/2013  . Nausea vomiting and diarrhea 08/16/2012  . Allergic rhinitis 04/24/2012  . Preventative health care 08/14/2011    Past Surgical History:  Procedure Laterality Date  . NO PAST SURGERIES      OB History    Gravida Para Term Preterm AB Living   0             SAB TAB Ectopic Multiple Live Births                   Home Medications    Prior to Admission medications   Medication Sig Start Date End Date Taking? Authorizing Provider  acetaminophen (TYLENOL) 500 MG tablet Take 500 mg by mouth every 6 (six) hours as needed  for mild pain.   Yes Historical Provider, MD  albuterol (PROVENTIL HFA;VENTOLIN HFA) 108 (90 Base) MCG/ACT inhaler Inhale 2 puffs into the lungs every 4 (four) hours as needed for wheezing or shortness of breath. 11/13/15  Yes Zadie Rhine, MD  azithromycin (ZITHROMAX) 250 MG tablet 1 tab po qday x 3 more days, zero refills Patient not taking: Reported on 07/20/2016 06/04/16   Jerald Kief, MD  cefUROXime (CEFTIN) 250 MG tablet Take 1 tablet (250 mg total) by mouth 2 (two) times daily with a meal. Patient not taking: Reported on 07/20/2016 06/04/16   Jerald Kief, MD  predniSONE (DELTASONE) 5 MG tablet Take 1 tablet (5 mg total) by mouth daily with breakfast. Patient not taking: Reported on 07/20/2016 06/04/16   Jerald Kief, MD    Family History Family History  Problem Relation Age of Onset  . Arthritis Other   . Cancer Other     breast cancer  . Hypertension Other   . Asthma Other   . Hyperlipidemia Other   . Hypertension Other   . Diabetes Other     Social History Social History  Substance Use Topics  . Smoking status: Former Smoker    Packs/day: 0.10    Types: Cigarettes  . Smokeless tobacco: Never Used  . Alcohol  use Yes     Comment: occasionally     Allergies   Patient has no known allergies.   Review of Systems Review of Systems  10 systems reviewed and found to be negative, except as noted in the HPI.   Physical Exam Updated Vital Signs BP 146/93 (BP Location: Right Arm)   Pulse 78   Temp 97.8 F (36.6 C) (Oral)   Resp 16   Ht 4\' 11"  (1.499 m)   Wt 68.9 kg   SpO2 99%   BMI 30.70 kg/m   Physical Exam  Constitutional: She is oriented to person, place, and time. She appears well-developed and well-nourished. No distress.  HENT:  Head: Normocephalic.  Mouth/Throat: Oropharynx is clear and moist.  Eyes: Conjunctivae and EOM are normal.  Neck: Normal range of motion.  Cardiovascular: Normal rate.   Pulmonary/Chest: Effort normal. No stridor.    Abdominal: Soft. Bowel sounds are normal. She exhibits no distension and no mass. There is no tenderness. There is no rebound and no guarding.  Genitourinary:  Genitourinary Comments: No CVA tenderness to percussion bilaterally  Musculoskeletal: Normal range of motion.  Neurological: She is alert and oriented to person, place, and time.  Psychiatric: She has a normal mood and affect.  Nursing note and vitals reviewed.    ED Treatments / Results  Labs (all labs ordered are listed, but only abnormal results are displayed) Labs Reviewed  COMPREHENSIVE METABOLIC PANEL - Abnormal; Notable for the following:       Result Value   Glucose, Bld 106 (*)    Calcium 8.4 (*)    All other components within normal limits  CBC - Abnormal; Notable for the following:    RBC 5.24 (*)    MCV 75.6 (*)    MCH 25.2 (*)    Platelets 415 (*)    All other components within normal limits  URINALYSIS, ROUTINE W REFLEX MICROSCOPIC - Abnormal; Notable for the following:    APPearance CLOUDY (*)    pH 9.0 (*)    Hgb urine dipstick LARGE (*)    Protein, ur 100 (*)    Leukocytes, UA SMALL (*)    Squamous Epithelial / LPF 6-30 (*)    All other components within normal limits  LIPASE, BLOOD  I-STAT BETA HCG BLOOD, ED ([facility], [facility], [facility] ONLY)    EKG  EKG Interpretation None       Radiology No results found.  Procedures Procedures (including critical care time)  Medications Ordered in ED Medications  ondansetron (ZOFRAN-ODT) disintegrating tablet 4 mg (4 mg Oral Given 07/20/16 1240)  gi cocktail (Maalox,Lidocaine,Donnatal) (30 mLs Oral Given 07/20/16 1240)     Initial Impression / Assessment and Plan / ED Course  I have reviewed the triage vital signs and the nursing notes.  Pertinent labs & imaging results that were available during my care of the patient were reviewed by me and considered in my medical decision making (see chart for details).     Vitals:   07/20/16 1018  BP: 146/93  Pulse:  78  Resp: 16  Temp: 97.8 F (36.6 C)  TempSrc: Oral  SpO2: 99%  Weight: 68.9 kg  Height: 4\' 11"  (1.499 m)    Medications  ondansetron (ZOFRAN-ODT) disintegrating tablet 4 mg (4 mg Oral Given 07/20/16 1240)  gi cocktail (Maalox,Lidocaine,Donnatal) (30 mLs Oral Given 07/20/16 1240)    Andrea Daniels is 26 y.o. female presenting with Mild acute left upper quadrant pain onset 30 minutes prior to  arrival. Associated with nausea no emesis. Abdominal exam is benign. No CVA tenderness to percussion to suggest a pyelonephritis. Blood work reassuring, urinalysis is contaminated with too numerous to count reds and whites small leukocytes. No bacteria. Patient with no urinary complaints, she is menstruating. I doubt this is a [place]. Repeat abdominal exam is benign, patient reports improvement after GI cocktail. I advised her to obtain over-the-counter antacids. Resource guide given for outpatient care.  Evaluation does not show pathology that would require ongoing emergent intervention or inpatient treatment. Pt is hemodynamically stable and mentating appropriately. Discussed findings and plan with patient/guardian, who agrees with care plan. All questions answered. Return precautions discussed and outpatient follow up given.    Final Clinical Impressions(s) / ED Diagnoses   Final diagnoses:  LUQ pain    New Prescriptions New Prescriptions   No medications on file     Wynetta Emery, PA-C 07/20/16 1246    Raeford Razor, MD 07/21/16 1635

## 2016-07-20 NOTE — Discharge Instructions (Signed)
Obtain over-the-counter antacids like omeprazole or Zantac. He can also try Maalox or Tums.   Do not hesitate to return to the emergency room for any new, worsening or concerning symptoms.  Please obtain primary care using resource guide below. Let them know that you were seen in the emergency room and that they will need to obtain records for further outpatient management.

## 2016-08-06 ENCOUNTER — Encounter (HOSPITAL_COMMUNITY): Payer: Self-pay | Admitting: Vascular Surgery

## 2016-08-06 DIAGNOSIS — Z87891 Personal history of nicotine dependence: Secondary | ICD-10-CM | POA: Insufficient documentation

## 2016-08-06 DIAGNOSIS — J069 Acute upper respiratory infection, unspecified: Secondary | ICD-10-CM | POA: Insufficient documentation

## 2016-08-06 DIAGNOSIS — J45909 Unspecified asthma, uncomplicated: Secondary | ICD-10-CM | POA: Insufficient documentation

## 2016-08-06 DIAGNOSIS — R109 Unspecified abdominal pain: Secondary | ICD-10-CM | POA: Insufficient documentation

## 2016-08-06 LAB — I-STAT BETA HCG BLOOD, ED (MC, WL, AP ONLY): I-stat hCG, quantitative: <5 m[IU]/mL (ref <5)

## 2016-08-06 LAB — URINALYSIS, ROUTINE W REFLEX MICROSCOPIC
Bilirubin Urine: NEGATIVE
Glucose, UA: NEGATIVE mg/dL
Ketones, ur: NEGATIVE mg/dL
Leukocytes, UA: NEGATIVE
Nitrite: NEGATIVE
Protein, ur: 30 mg/dL — AB
Specific Gravity, Urine: 1.023 (ref 1.005–1.030)
pH: 5 (ref 5.0–8.0)

## 2016-08-06 LAB — CBC
HCT: 34 % — ABNORMAL LOW (ref 36.0–46.0)
Hemoglobin: 11.1 g/dL — ABNORMAL LOW (ref 12.0–15.0)
MCH: 24.6 pg — ABNORMAL LOW (ref 26.0–34.0)
MCHC: 32.6 g/dL (ref 30.0–36.0)
MCV: 75.2 fL — ABNORMAL LOW (ref 78.0–100.0)
Platelets: 376 10*3/uL (ref 150–400)
RBC: 4.52 MIL/uL (ref 3.87–5.11)
RDW: 14.5 % (ref 11.5–15.5)
WBC: 6.6 10*3/uL (ref 4.0–10.5)

## 2016-08-06 LAB — COMPREHENSIVE METABOLIC PANEL
ALT: 15 U/L (ref 14–54)
AST: 25 U/L (ref 15–41)
Albumin: 4.2 g/dL (ref 3.5–5.0)
Alkaline Phosphatase: 55 U/L (ref 38–126)
Anion gap: 10 (ref 5–15)
BUN: 5 mg/dL — ABNORMAL LOW (ref 6–20)
CO2: 22 mmol/L (ref 22–32)
Calcium: 9.1 mg/dL (ref 8.9–10.3)
Chloride: 106 mmol/L (ref 101–111)
Creatinine, Ser: 0.87 mg/dL (ref 0.44–1.00)
GFR calc Af Amer: >60 mL/min (ref >60)
GFR calc non Af Amer: >60 mL/min (ref >60)
Glucose, Bld: 94 mg/dL (ref 65–99)
Potassium: 3.7 mmol/L (ref 3.5–5.1)
Sodium: 138 mmol/L (ref 135–145)
Total Bilirubin: 0.7 mg/dL (ref 0.3–1.2)
Total Protein: 7.6 g/dL (ref 6.5–8.1)

## 2016-08-06 LAB — LIPASE, BLOOD: Lipase: 23 U/L (ref 11–51)

## 2016-08-06 MED ORDER — ONDANSETRON 4 MG PO TBDP
ORAL_TABLET | ORAL | Status: AC
  Filled 2016-08-06: qty 1

## 2016-08-06 MED ORDER — ONDANSETRON 4 MG PO TBDP
4.0000 mg | ORAL_TABLET | Freq: Once | ORAL | Status: AC | PRN
  Administered 2016-08-06: 4 mg via ORAL

## 2016-08-06 NOTE — ED Triage Notes (Addendum)
Pt reports to the ED for eval of upper abd pain, nausea, and sensation that she has to have a BM but she is unable to. Pt describes the pain as a burning. Onset approx 1 hour ago. Denies any active vomiting or diarrhea. Also denies any urinary symptoms. She had some nachos at work before the pain started (states she has eaten them before but she has never gotten sick). She has not taken any medications for the symptoms.

## 2016-08-07 ENCOUNTER — Emergency Department (HOSPITAL_COMMUNITY): Payer: 59

## 2016-08-07 ENCOUNTER — Emergency Department (HOSPITAL_COMMUNITY)
Admission: EM | Admit: 2016-08-07 | Discharge: 2016-08-07 | Disposition: A | Discharge status: Home / Self Care | Payer: 59 | Attending: Emergency Medicine | Admitting: Emergency Medicine

## 2016-08-07 DIAGNOSIS — B9789 Other viral agents as the cause of diseases classified elsewhere: Secondary | ICD-10-CM

## 2016-08-07 DIAGNOSIS — J069 Acute upper respiratory infection, unspecified: Secondary | ICD-10-CM

## 2016-08-07 LAB — INFLUENZA PANEL BY PCR (TYPE A & B)
Influenza A By PCR: NEGATIVE
Influenza B By PCR: NEGATIVE

## 2016-08-07 MED ORDER — IBUPROFEN 800 MG PO TABS
800.0000 mg | ORAL_TABLET | Freq: Once | ORAL | Status: AC
  Administered 2016-08-07: 800 mg via ORAL
  Filled 2016-08-07: qty 1

## 2016-08-07 MED ORDER — IBUPROFEN 600 MG PO TABS: 600.0000 mg | ORAL_TABLET | Freq: Four times a day (QID) | ORAL | 0 refills | Status: DC | PRN

## 2016-08-07 MED ORDER — ONDANSETRON 4 MG PO TBDP: 4.0000 mg | ORAL_TABLET | Freq: Three times a day (TID) | ORAL | 0 refills | Status: DC | PRN

## 2016-08-07 MED ORDER — LORATADINE 10 MG PO TABS: 10.0000 mg | ORAL_TABLET | Freq: Every day | ORAL | 0 refills | Status: DC

## 2016-08-07 MED ORDER — BENZONATATE 100 MG PO CAPS: 100.0000 mg | ORAL_CAPSULE | Freq: Three times a day (TID) | ORAL | 0 refills | Status: DC | PRN

## 2016-08-07 MED ORDER — HYDROCODONE-HOMATROPINE 5-1.5 MG/5ML PO SYRP
5.0000 mL | ORAL_SOLUTION | Freq: Once | ORAL | Status: AC
  Administered 2016-08-07: 5 mL via ORAL
  Filled 2016-08-07: qty 5

## 2016-08-07 MED ORDER — LORATADINE 10 MG PO TABS
10.0000 mg | ORAL_TABLET | Freq: Once | ORAL | Status: AC
  Administered 2016-08-07: 10 mg via ORAL
  Filled 2016-08-07: qty 1

## 2016-08-07 MED ORDER — HYDROCODONE-HOMATROPINE 5-1.5 MG/5ML PO SYRP
5.0000 mL | ORAL_SOLUTION | Freq: Four times a day (QID) | ORAL | 0 refills | Status: DC | PRN
Start: 2016-08-07 — End: 2016-10-17

## 2016-08-07 NOTE — ED Notes (Signed)
Pt here for nausea, vomiting, cough, and congestion. See EDP's notes for further.

## 2016-08-07 NOTE — ED Provider Notes (Signed)
[facility]-EMERGENCY DEPT Provider Note   CSN: [id number] Arrival date & time: 08/06/16  2232   History   Chief Complaint Chief Complaint  Patient presents with  . Nausea  . Abdominal Pain    HPI Andrea Daniels is a 26 y.o. female.  26 year old female with a history of gastritis, ovarian cysts, and asthma presents to the emergency department for evaluation of abdominal pain. She states that she has been having pain on both sides of her abdomen which began at 2100 this evening. Pain has been fairly constant and associated with nausea. It is worse with coughing. She states that symptoms were preceded by nasal congestion and cough productive of clear phlegm over the past 2 days. She had one episode of diarrhea this evening. No associated vomiting, fever, vaginal complaints, dysuria, or hematuria. No medications taken prior to arrival for symptoms. She denies a history of abdominal surgeries. She is currently on her menstrual cycle.      Past Medical History:  Diagnosis Date  . Asthma   . Gastritis   . Gastritis   . Ovarian cyst   . UTI (lower urinary tract infection)     Patient Active Problem List   Diagnosis Date Noted  . CAP (community acquired pneumonia) 06/03/2016  . Epigastric abdominal pain   . Tachycardia 06/02/2016  . Leukocytosis 06/02/2016  . Community acquired pneumonia of right lung 06/02/2016  . Asthma 06/02/2016  . Hypoxia   . Hypophosphatemia 03/20/2015  . Low TSH level 03/20/2015  . Metabolic acidosis 03/20/2015  . Microcytic anemia 03/20/2015  . Exacerbation of asthma 03/19/2015  . Elevated glucose 07/03/2014  . Asthma with exacerbation 10/22/2013  . Hives 07/11/2013  . Status asthmaticus 06/08/2013  . Nausea vomiting and diarrhea 08/16/2012  . Allergic rhinitis 04/24/2012  . Preventative health care 08/14/2011    Past Surgical History:  Procedure Laterality Date  . NO PAST SURGERIES      OB History    Gravida Para Term Preterm AB Living   0              SAB TAB Ectopic Multiple Live Births                   Home Medications    Prior to Admission medications   Medication Sig Start Date End Date Taking? Authorizing Provider  acetaminophen (TYLENOL) 500 MG tablet Take 500 mg by mouth every 6 (six) hours as needed for mild pain.    Historical Provider, MD  albuterol (PROVENTIL HFA;VENTOLIN HFA) 108 (90 Base) MCG/ACT inhaler Inhale 2 puffs into the lungs every 4 (four) hours as needed for wheezing or shortness of breath. 11/13/15   Zadie Rhine, MD  azithromycin (ZITHROMAX) 250 MG tablet 1 tab po qday x 3 more days, zero refills Patient not taking: Reported on 07/20/2016 06/04/16   Jerald Kief, MD  benzonatate (TESSALON) 100 MG capsule Take 1 capsule (100 mg total) by mouth 3 (three) times daily as needed for cough. 08/07/16   Antony Madura, PA-C  cefUROXime (CEFTIN) 250 MG tablet Take 1 tablet (250 mg total) by mouth 2 (two) times daily with a meal. Patient not taking: Reported on 07/20/2016 06/04/16   Jerald Kief, MD  HYDROcodone-homatropine [facility]) 5-1.5 MG/5ML syrup Take 5 mLs by mouth every 6 (six) hours as needed for cough. 08/07/16   Antony Madura, PA-C  ibuprofen (ADVIL,MOTRIN) 600 MG tablet Take 1 tablet (600 mg total) by mouth every 6 (six) hours as  needed for mild pain or moderate pain. 08/07/16   Antony Madura, PA-C  loratadine (CLARITIN) 10 MG tablet Take 1 tablet (10 mg total) by mouth daily. 08/07/16   Antony Madura, PA-C  ondansetron (ZOFRAN ODT) 4 MG disintegrating tablet Take 1 tablet (4 mg total) by mouth every 8 (eight) hours as needed for nausea or vomiting. 08/07/16   Antony Madura, PA-C  predniSONE (DELTASONE) 5 MG tablet Take 1 tablet (5 mg total) by mouth daily with breakfast. Patient not taking: Reported on 07/20/2016 06/04/16   Jerald Kief, MD    Family History Family History  Problem Relation Age of Onset  . Arthritis Other   . Cancer Other     breast cancer  . Hypertension Other   . Asthma Other   .  Hyperlipidemia Other   . Hypertension Other   . Diabetes Other     Social History Social History  Substance Use Topics  . Smoking status: Former Smoker    Packs/day: 0.10    Types: Cigarettes  . Smokeless tobacco: Never Used  . Alcohol use Yes     Comment: occasionally     Allergies   Patient has no known allergies.   Review of Systems Review of Systems Ten systems reviewed and are negative for acute change, except as noted in the HPI.    Physical Exam Updated Vital Signs BP 133/79 (BP Location: Right Arm)   Pulse 95   Temp 98.2 F (36.8 C) (Oral)   Resp 18   SpO2 98%   Physical Exam  Constitutional: She is oriented to person, place, and time. She appears well-developed and well-nourished. No distress.  Nontoxic and in NAD  HENT:  Head: Normocephalic and atraumatic.  Eyes: Conjunctivae and EOM are normal. No scleral icterus.  Neck: Normal range of motion.  Cardiovascular: Normal rate, regular rhythm and intact distal pulses.   Pulmonary/Chest: Effort normal. No respiratory distress. She has no wheezes. She has no rales.  Lungs CTAB. Respirations even and unlabored. Dry, hacking cough noted.  Abdominal: Soft. She exhibits no mass. There is no guarding.  Soft, obese abdomen. No focal TTP. No peritoneal signs or guarding.  Musculoskeletal: Normal range of motion.  Neurological: She is alert and oriented to person, place, and time. She exhibits normal muscle tone. Coordination normal.  GCS 15. Patient moving all extremities.  Skin: Skin is warm and dry. No rash noted. She is not diaphoretic. No erythema. No pallor.  Psychiatric: She has a normal mood and affect. Her behavior is normal.  Nursing note and vitals reviewed.    ED Treatments / Results  Labs (all labs ordered are listed, but only abnormal results are displayed) Labs Reviewed  COMPREHENSIVE METABOLIC PANEL - Abnormal; Notable for the following:       Result Value   BUN 5 (*)    All other components  within normal limits  CBC - Abnormal; Notable for the following:    Hemoglobin 11.1 (*)    HCT 34.0 (*)    MCV 75.2 (*)    MCH 24.6 (*)    All other components within normal limits  URINALYSIS, ROUTINE W REFLEX MICROSCOPIC - Abnormal; Notable for the following:    APPearance HAZY (*)    Hgb urine dipstick LARGE (*)    Protein, ur 30 (*)    Bacteria, UA RARE (*)    Squamous Epithelial / LPF 0-5 (*)    All other components within normal limits  LIPASE, BLOOD  INFLUENZA  PANEL BY PCR (TYPE A & B)  I-STAT BETA HCG BLOOD, ED ([facility], [facility], [facility] ONLY)    EKG  EKG Interpretation None       Radiology Dg Chest 2 View  Result Date: 08/07/2016 CLINICAL DATA:  Upper abdominal pain with nausea EXAM: CHEST  2 VIEW COMPARISON:  06/02/2016 FINDINGS: The heart size and mediastinal contours are within normal limits. Both lungs are clear. The visualized skeletal structures are unremarkable. IMPRESSION: No active cardiopulmonary disease. Electronically Signed   By: Jasmine PangKim  Fujinaga M.D.   On: 08/07/2016 01:55    Procedures Procedures (including critical care time)  Medications Ordered in ED Medications  ondansetron (ZOFRAN-ODT) disintegrating tablet 4 mg (4 mg Oral Given 08/06/16 2255)  loratadine (CLARITIN) tablet 10 mg (10 mg Oral Given 08/07/16 0131)  HYDROcodone-homatropine (HYCODAN) 5-1.5 MG/5ML syrup 5 mL (5 mLs Oral Given 08/07/16 0246)  ibuprofen (ADVIL,MOTRIN) tablet 800 mg (800 mg Oral Given 08/07/16 0131)     Initial Impression / Assessment and Plan / ED Course  I have reviewed the triage vital signs and the nursing notes.  Pertinent labs & imaging results that were available during my care of the patient were reviewed by me and considered in my medical decision making (see chart for details).     Patient presents c/o b/l abdominal wall pain, onset tonight and worse with coughing. Symptoms associated with fatigue and URI symptoms. Labs reassuring. No fever or leukocytosis. Pt CXR negative  for acute infiltrate. Pain has improved with supportive measures given in the ED.  Patient's symptoms likely secondary to viral etiology. Discussed that antibiotics are not indicated for viral infections. Pt will be discharged with symptomatic treatment. She verbalizes understanding and is agreeable with plan. Pt is hemodynamically stable and in NAD prior to discharge.   Final Clinical Impressions(s) / ED Diagnoses   Final diagnoses:  Viral URI with cough    New Prescriptions Discharge Medication List as of 08/07/2016  3:11 AM    START taking these medications   Details  benzonatate (TESSALON) 100 MG capsule Take 1 capsule (100 mg total) by mouth 3 (three) times daily as needed for cough., Starting Sun 08/07/2016, Print    HYDROcodone-homatropine (HYCODAN) 5-1.5 MG/5ML syrup Take 5 mLs by mouth every 6 (six) hours as needed for cough., Starting Sun 08/07/2016, Print    ibuprofen (ADVIL,MOTRIN) 600 MG tablet Take 1 tablet (600 mg total) by mouth every 6 (six) hours as needed for mild pain or moderate pain., Starting Sun 08/07/2016, Print    loratadine (CLARITIN) 10 MG tablet Take 1 tablet (10 mg total) by mouth daily., Starting Sun 08/07/2016, Print    ondansetron (ZOFRAN ODT) 4 MG disintegrating tablet Take 1 tablet (4 mg total) by mouth every 8 (eight) hours as needed for nausea or vomiting., Starting Sun 08/07/2016, Print         Antony MaduraKelly Lyan Holck, PA-C 08/07/16 0334    Layla MawKristen N Ward, DO 08/07/16 [id number]

## 2016-08-11 ENCOUNTER — Emergency Department (HOSPITAL_COMMUNITY)
Admission: EM | Admit: 2016-08-11 | Discharge: 2016-08-12 | Disposition: A | Discharge status: Home / Self Care | Payer: 59 | Attending: Emergency Medicine | Admitting: Emergency Medicine

## 2016-08-11 ENCOUNTER — Emergency Department (HOSPITAL_COMMUNITY): Payer: 59

## 2016-08-11 DIAGNOSIS — B9789 Other viral agents as the cause of diseases classified elsewhere: Secondary | ICD-10-CM

## 2016-08-11 DIAGNOSIS — J45901 Unspecified asthma with (acute) exacerbation: Secondary | ICD-10-CM | POA: Insufficient documentation

## 2016-08-11 DIAGNOSIS — Z87891 Personal history of nicotine dependence: Secondary | ICD-10-CM | POA: Insufficient documentation

## 2016-08-11 DIAGNOSIS — J069 Acute upper respiratory infection, unspecified: Secondary | ICD-10-CM | POA: Insufficient documentation

## 2016-08-11 LAB — COMPREHENSIVE METABOLIC PANEL
ALT: 13 U/L — ABNORMAL LOW (ref 14–54)
AST: 24 U/L (ref 15–41)
Albumin: 4 g/dL (ref 3.5–5.0)
Alkaline Phosphatase: 53 U/L (ref 38–126)
Anion gap: 9 (ref 5–15)
BUN: 7 mg/dL (ref 6–20)
CO2: 23 mmol/L (ref 22–32)
Calcium: 8.6 mg/dL — ABNORMAL LOW (ref 8.9–10.3)
Chloride: 105 mmol/L (ref 101–111)
Creatinine, Ser: 0.9 mg/dL (ref 0.44–1.00)
GFR calc Af Amer: >60 mL/min (ref >60)
GFR calc non Af Amer: >60 mL/min (ref >60)
Glucose, Bld: 100 mg/dL — ABNORMAL HIGH (ref 65–99)
Potassium: 3.5 mmol/L (ref 3.5–5.1)
Sodium: 137 mmol/L (ref 135–145)
Total Bilirubin: 0.8 mg/dL (ref 0.3–1.2)
Total Protein: 7.4 g/dL (ref 6.5–8.1)

## 2016-08-11 LAB — URINALYSIS, ROUTINE W REFLEX MICROSCOPIC
Bacteria, UA: NONE SEEN
Bilirubin Urine: NEGATIVE
Glucose, UA: NEGATIVE mg/dL
Ketones, ur: NEGATIVE mg/dL
Leukocytes, UA: NEGATIVE
Nitrite: NEGATIVE
Protein, ur: NEGATIVE mg/dL
Specific Gravity, Urine: 1.019 (ref 1.005–1.030)
pH: 7 (ref 5.0–8.0)

## 2016-08-11 LAB — CBC
HCT: 32.3 % — ABNORMAL LOW (ref 36.0–46.0)
Hemoglobin: 10.8 g/dL — ABNORMAL LOW (ref 12.0–15.0)
MCH: 25.1 pg — ABNORMAL LOW (ref 26.0–34.0)
MCHC: 33.4 g/dL (ref 30.0–36.0)
MCV: 74.9 fL — ABNORMAL LOW (ref 78.0–100.0)
Platelets: 386 10*3/uL (ref 150–400)
RBC: 4.31 MIL/uL (ref 3.87–5.11)
RDW: 14.1 % (ref 11.5–15.5)
WBC: 7.8 10*3/uL (ref 4.0–10.5)

## 2016-08-11 LAB — LIPASE, BLOOD: Lipase: 19 U/L (ref 11–51)

## 2016-08-11 LAB — PREGNANCY, URINE: Preg Test, Ur: NEGATIVE

## 2016-08-11 MED ORDER — METHYLPREDNISOLONE SODIUM SUCC 125 MG IJ SOLR: 125.0000 mg | Freq: Once | INTRAMUSCULAR | Status: DC

## 2016-08-11 MED ORDER — ALBUTEROL (5 MG/ML) CONTINUOUS INHALATION SOLN
10.0000 mg/h | INHALATION_SOLUTION | RESPIRATORY_TRACT | Status: AC
  Administered 2016-08-11: 10 mg/h via RESPIRATORY_TRACT
  Filled 2016-08-11: qty 20

## 2016-08-11 MED ORDER — IPRATROPIUM-ALBUTEROL 0.5-2.5 (3) MG/3ML IN SOLN: 3.0000 mL | Freq: Once | RESPIRATORY_TRACT | Status: DC

## 2016-08-11 NOTE — ED Triage Notes (Signed)
Pt comes from home via EMS with complaints of SOB.  Wheezing present in all fields.  10 mg of albuterol, 1 mg of atrovent, and 125 mg of solu medrol given in route.  Reports member in house has been sick.  Hx of gastritis and complains of abdominal pain as well. 93% O2 on arrival.  20 g in left AC.  Has albuterol inhaler at home. BP 170/122, HR 99, SpO2 97% on neb. A&O x4.  Ambulatory.

## 2016-08-11 NOTE — ED Notes (Signed)
Writer asked pt if she could use the bathroom, pt just moaned. Writer told pt that we will try again.

## 2016-08-11 NOTE — ED Notes (Signed)
Bed: [id number] Expected date:  Expected time:  Means of arrival:  Comments: 26 yo F  resp distress  Asthma

## 2016-08-12 MED ORDER — PREDNISONE 20 MG PO TABS: 40.0000 mg | ORAL_TABLET | Freq: Every day | ORAL | 0 refills | Status: DC

## 2016-08-12 MED ORDER — ALBUTEROL SULFATE HFA 108 (90 BASE) MCG/ACT IN AERS
2.0000 | INHALATION_SPRAY | RESPIRATORY_TRACT | Status: DC | PRN
  Administered 2016-08-12: 2 via RESPIRATORY_TRACT
  Filled 2016-08-12: qty 6.7

## 2016-08-12 NOTE — ED Provider Notes (Signed)
[facility]-EMERGENCY DEPT Provider Note   CSN: [id number] Arrival date & time: 08/11/16 1936     History    Chief Complaint  Patient presents with  . Respiratory Distress  . Abdominal Pain     HPI Andrea Daniels is a 26 y.o. female.  26yo F w/ PMH including asthma who p/w Shortness of breath and cough. The patient presented here recently with cough and was diagnosed with viral URI. Around 6:54 PM today she became short of breath and this has progressively worsened despite using albuterol every 4 hours today. She denies any fevers, nasal congestion, sore throat, vomiting, or diarrhea. She does note a history of gastritis and has had some upper abdominal pain related to this. She denies any recent travel. No leg swelling or history of blood clots. She does feel a little better after albuterol from EMS.   Past Medical History:  Diagnosis Date  . Asthma   . Gastritis   . Gastritis   . Ovarian cyst   . UTI (lower urinary tract infection)      Patient Active Problem List   Diagnosis Date Noted  . CAP (community acquired pneumonia) 06/03/2016  . Epigastric abdominal pain   . Tachycardia 06/02/2016  . Leukocytosis 06/02/2016  . Community acquired pneumonia of right lung 06/02/2016  . Asthma 06/02/2016  . Hypoxia   . Hypophosphatemia 03/20/2015  . Low TSH level 03/20/2015  . Metabolic acidosis 03/20/2015  . Microcytic anemia 03/20/2015  . Exacerbation of asthma 03/19/2015  . Elevated glucose 07/03/2014  . Asthma with exacerbation 10/22/2013  . Hives 07/11/2013  . Status asthmaticus 06/08/2013  . Nausea vomiting and diarrhea 08/16/2012  . Allergic rhinitis 04/24/2012  . Preventative health care 08/14/2011    Past Surgical History:  Procedure Laterality Date  . NO PAST SURGERIES      OB History    Gravida Para Term Preterm AB Living   0             SAB TAB Ectopic Multiple Live Births                    Home Medications    Prior to Admission medications     Medication Sig Start Date End Date Taking? Authorizing Provider  albuterol (PROVENTIL HFA;VENTOLIN HFA) 108 (90 Base) MCG/ACT inhaler Inhale 2 puffs into the lungs every 4 (four) hours as needed for wheezing or shortness of breath. 11/13/15  Yes Zadie Rhine, MD  HYDROcodone-homatropine [facility]) 5-1.5 MG/5ML syrup Take 5 mLs by mouth every 6 (six) hours as needed for cough. 08/07/16  Yes Antony Madura, PA-C  loratadine (CLARITIN) 10 MG tablet Take 1 tablet (10 mg total) by mouth daily. 08/07/16  Yes Antony Madura, PA-C  ondansetron (ZOFRAN ODT) 4 MG disintegrating tablet Take 1 tablet (4 mg total) by mouth every 8 (eight) hours as needed for nausea or vomiting. 08/07/16  Yes Antony Madura, PA-C  acetaminophen (TYLENOL) 500 MG tablet Take 500 mg by mouth every 6 (six) hours as needed for mild pain.    Historical Provider, MD  benzonatate (TESSALON) 100 MG capsule Take 1 capsule (100 mg total) by mouth 3 (three) times daily as needed for cough. 08/07/16   Antony Madura, PA-C  ibuprofen (ADVIL,MOTRIN) 600 MG tablet Take 1 tablet (600 mg total) by mouth every 6 (six) hours as needed for mild pain or moderate pain. 08/07/16   Antony Madura, PA-C  predniSONE (DELTASONE) 20 MG tablet Take 2 tablets (40 mg total) by  mouth daily. 08/12/16   Laurence Spates, MD      Family History  Problem Relation Age of Onset  . Arthritis Other   . Cancer Other     breast cancer  . Hypertension Other   . Asthma Other   . Hyperlipidemia Other   . Hypertension Other   . Diabetes Other      Social History  Substance Use Topics  . Smoking status: Former Smoker    Packs/day: 0.10    Types: Cigarettes  . Smokeless tobacco: Never Used  . Alcohol use Yes     Comment: occasionally     Allergies     Patient has no known allergies.    Review of Systems  10 Systems reviewed and are negative for acute change except as noted in the HPI.   Physical Exam Updated Vital Signs BP 136/81   Pulse 112   Temp 98.7 F  (37.1 C) (Oral)   Resp 22   LMP 08/08/2016   SpO2 95%   Physical Exam  Constitutional: She is oriented to person, place, and time. She appears well-developed and well-nourished. No distress.  HENT:  Head: Normocephalic and atraumatic.  Moist mucous membranes  Eyes: Conjunctivae are normal. Pupils are equal, round, and reactive to light.  Neck: Neck supple.  Cardiovascular: Regular rhythm and normal heart sounds.  Tachycardia present.   No murmur heard. Pulmonary/Chest: No respiratory distress. She has wheezes.  Mildly increased WOB w/ diminished BS b/l, expiratory wheezes  Abdominal: Soft. Bowel sounds are normal. She exhibits no distension. There is no tenderness.  Musculoskeletal: She exhibits no edema.  Neurological: She is alert and oriented to person, place, and time.  Fluent speech  Skin: Skin is warm and dry.  Psychiatric: She has a normal mood and affect. Judgment normal.  Nursing note and vitals reviewed.     ED Treatments / Results  Labs (all labs ordered are listed, but only abnormal results are displayed) Labs Reviewed  COMPREHENSIVE METABOLIC PANEL - Abnormal; Notable for the following:       Result Value   Glucose, Bld 100 (*)    Calcium 8.6 (*)    ALT 13 (*)    All other components within normal limits  CBC - Abnormal; Notable for the following:    Hemoglobin 10.8 (*)    HCT 32.3 (*)    MCV 74.9 (*)    MCH 25.1 (*)    All other components within normal limits  URINALYSIS, ROUTINE W REFLEX MICROSCOPIC - Abnormal; Notable for the following:    Hgb urine dipstick MODERATE (*)    Squamous Epithelial / LPF 0-5 (*)    All other components within normal limits  LIPASE, BLOOD  PREGNANCY, URINE     EKG  EKG Interpretation  Date/Time:  Thursday August 11 2016 19:47:10 EST Ventricular Rate:  95 PR Interval:    QRS Duration: 79 QT Interval:  349 QTC Calculation: 439 R Axis:   48 Text Interpretation:  Sinus rhythm Borderline T abnormalities, anterior  leads Baseline wander in lead(s) V4 since previous tracing tachycardia has resolved Confirmed by LITTLE MD, RACHEL [phone]) on 08/11/2016 8:44:26 PM         Radiology Dg Chest 2 View  Result Date: 08/11/2016 CLINICAL DATA:  Acute onset of shortness of breath and wheezing. Initial encounter. EXAM: CHEST  2 VIEW COMPARISON:  Chest radiograph performed 08/07/2016 FINDINGS: The lungs are well-aerated and clear. There is no evidence of focal opacification, pleural effusion  or pneumothorax. The heart is normal in size; the mediastinal contour is within normal limits. No acute osseous abnormalities are seen. IMPRESSION: No acute cardiopulmonary process seen. Electronically Signed   By: Roanna RaiderJeffery  Chang M.D.   On: 08/11/2016 20:21    Procedures Procedures (including critical care time) Procedures  Medications Ordered in ED  Medications  albuterol (PROVENTIL,VENTOLIN) solution continuous neb (0 mg/hr Nebulization Stopped 08/11/16 2300)  albuterol (PROVENTIL HFA;VENTOLIN HFA) 108 (90 Base) MCG/ACT inhaler 2 puff (not administered)     Initial Impression / Assessment and Plan / ED Course  I have reviewed the triage vital signs and the nursing notes.  Pertinent labs & imaging results that were available during my care of the patient were reviewed by me and considered in my medical decision making (see chart for details).     Pt w/ h/o asthma p/w worsening shortness of breath in the setting of viral URI. She was nontoxic on exam, mildly tachycardic, O2 ~90-92% on RA. She did have wheezes bilaterally with diminished breath sounds. Because of her ongoing diminished breath sounds despite receiving albuterol by EMS, placed the patient on one hour of continuous albuterol. EMS had already given Solu-Medrol. Obtained above lab work which was unremarkable and shows normal WBC count. Chest x-ray negative for acute process. Given her history of recent cough/cold symptoms, I suspect that her viral URI has  triggered asthma exacerbation. Patient was observed after taken off of continuous albuterol and on reexamination she was breathing comfortably with significantly improved wheezing and good air movement. Normal O2 saturation. Gave prednisone to use at home and instructed to continue scheduled albuterol for the next few days. Reviewed return precautions. Patient voiced understanding was discharged in satisfactory condition.  Final Clinical Impressions(s) / ED Diagnoses   Final diagnoses:  Moderate asthma with exacerbation, unspecified whether persistent  Viral URI with cough     New Prescriptions   PREDNISONE (DELTASONE) 20 MG TABLET    Take 2 tablets (40 mg total) by mouth daily.       Laurence Spatesachel Morgan Little, MD 08/12/16 [phone]

## 2016-09-15 ENCOUNTER — Emergency Department (HOSPITAL_COMMUNITY): Payer: Self-pay

## 2016-09-15 ENCOUNTER — Encounter (HOSPITAL_COMMUNITY): Payer: Self-pay | Admitting: Family Medicine

## 2016-09-15 ENCOUNTER — Emergency Department (HOSPITAL_COMMUNITY)
Admission: EM | Admit: 2016-09-15 | Discharge: 2016-09-15 | Disposition: A | Discharge status: Home / Self Care | Payer: Self-pay | Attending: Emergency Medicine | Admitting: Emergency Medicine

## 2016-09-15 DIAGNOSIS — Z87891 Personal history of nicotine dependence: Secondary | ICD-10-CM | POA: Insufficient documentation

## 2016-09-15 DIAGNOSIS — J4521 Mild intermittent asthma with (acute) exacerbation: Secondary | ICD-10-CM | POA: Insufficient documentation

## 2016-09-15 MED ORDER — ALBUTEROL SULFATE HFA 108 (90 BASE) MCG/ACT IN AERS
2.0000 | INHALATION_SPRAY | Freq: Once | RESPIRATORY_TRACT | Status: AC
  Administered 2016-09-15: 2 via RESPIRATORY_TRACT
  Filled 2016-09-15: qty 6.7

## 2016-09-15 MED ORDER — ALBUTEROL SULFATE (2.5 MG/3ML) 0.083% IN NEBU
5.0000 mg | INHALATION_SOLUTION | Freq: Once | RESPIRATORY_TRACT | Status: AC
  Administered 2016-09-15: 5 mg via RESPIRATORY_TRACT
  Filled 2016-09-15: qty 6

## 2016-09-15 NOTE — ED Provider Notes (Signed)
[facility]-EMERGENCY DEPT Provider Note   CSN: [id number] Arrival date & time: 09/15/16  3086  By signing my name below, I, Rosario Adie, attest that this documentation has been prepared under the direction and in the presence of [employer], PA-C.  Electronically Signed: Rosario Adie, ED Scribe. 09/15/16. 2:09 AM.   History   Chief Complaint Chief Complaint  Patient presents with  . Asthma   The history is provided by the patient. No language interpreter was used.    HPI Comments: Andrea Daniels is a 26 y.o. female with a h/o asthma, who presents to the Emergency Department complaining of sudden onset, persistent shortness of breath with associated wheezing beginning approximately 30 minutes prior to arrival. Per pt, she was standing ain a very hot room today when her symptoms began. She has a h/o asthma and states that her symptoms feel similar to this. Pt attempted to use her rescue inhaler prior to coming into the ED without relief of her symptoms; however, breathing treatments while in the ED have improved her symptoms. She denies fever, hemoptysis, syncope, or any other associated symptoms.    Past Medical History:  Diagnosis Date  . Asthma   . Gastritis   . Gastritis   . Ovarian cyst   . UTI (lower urinary tract infection)    Patient Active Problem List   Diagnosis Date Noted  . CAP (community acquired pneumonia) 06/03/2016  . Epigastric abdominal pain   . Tachycardia 06/02/2016  . Leukocytosis 06/02/2016  . Community acquired pneumonia of right lung 06/02/2016  . Asthma 06/02/2016  . Hypoxia   . Hypophosphatemia 03/20/2015  . Low TSH level 03/20/2015  . Metabolic acidosis 03/20/2015  . Microcytic anemia 03/20/2015  . Exacerbation of asthma 03/19/2015  . Elevated glucose 07/03/2014  . Asthma with exacerbation 10/22/2013  . Hives 07/11/2013  . Status asthmaticus 06/08/2013  . Nausea vomiting and diarrhea 08/16/2012  . Allergic rhinitis 04/24/2012  .  Preventative health care 08/14/2011   Past Surgical History:  Procedure Laterality Date  . NO PAST SURGERIES     OB History    Gravida Para Term Preterm AB Living   0             SAB TAB Ectopic Multiple Live Births                 Home Medications    Prior to Admission medications   Medication Sig Start Date End Date Taking? Authorizing Provider  acetaminophen (TYLENOL) 500 MG tablet Take 500 mg by mouth every 6 (six) hours as needed for mild pain.    Historical Provider, MD  albuterol (PROVENTIL HFA;VENTOLIN HFA) 108 (90 Base) MCG/ACT inhaler Inhale 2 puffs into the lungs every 4 (four) hours as needed for wheezing or shortness of breath. 11/13/15   Zadie Rhine, MD  benzonatate (TESSALON) 100 MG capsule Take 1 capsule (100 mg total) by mouth 3 (three) times daily as needed for cough. 08/07/16   Antony Madura, PA-C  HYDROcodone-homatropine [facility]) 5-1.5 MG/5ML syrup Take 5 mLs by mouth every 6 (six) hours as needed for cough. 08/07/16   Antony Madura, PA-C  ibuprofen (ADVIL,MOTRIN) 600 MG tablet Take 1 tablet (600 mg total) by mouth every 6 (six) hours as needed for mild pain or moderate pain. 08/07/16   Antony Madura, PA-C  loratadine (CLARITIN) 10 MG tablet Take 1 tablet (10 mg total) by mouth daily. 08/07/16   Antony Madura, PA-C  ondansetron (ZOFRAN ODT) 4 MG disintegrating  tablet Take 1 tablet (4 mg total) by mouth every 8 (eight) hours as needed for nausea or vomiting. 08/07/16   Antony Madura, PA-C  predniSONE (DELTASONE) 20 MG tablet Take 2 tablets (40 mg total) by mouth daily. 08/12/16   Laurence Spates, MD   Family History Family History  Problem Relation Age of Onset  . Arthritis Other   . Cancer Other     breast cancer  . Hypertension Other   . Asthma Other   . Hyperlipidemia Other   . Hypertension Other   . Diabetes Other    Social History Social History  Substance Use Topics  . Smoking status: Former Smoker    Packs/day: 0.10    Types: Cigarettes  . Smokeless  tobacco: Never Used  . Alcohol use Yes     Comment: Once a week    Allergies   Patient has no known allergies.  Review of Systems Review of Systems  Constitutional: Negative for fever.  Respiratory: Positive for shortness of breath and wheezing.   Neurological: Negative for syncope.  A complete 10 system review of systems was obtained and all systems are negative except as noted in the HPI and PMH.    Physical Exam Updated Vital Signs BP (!) 155/88 (BP Location: Left Arm)   Pulse (!) 111   Temp 98.1 F (36.7 C) (Oral)   Resp 20   Ht 4\' 11"  (1.499 m)   LMP 08/23/2016   SpO2 95%   Physical Exam  Constitutional: She is oriented to person, place, and time. She appears well-developed and well-nourished. No distress.  Nontoxic and in NAD  HENT:  Head: Normocephalic and atraumatic.  Eyes: Conjunctivae and EOM are normal. No scleral icterus.  Neck: Normal range of motion.  Cardiovascular: Regular rhythm and intact distal pulses.   Mild tachycardia; likely 2/2 albuterol  Pulmonary/Chest: Effort normal. No respiratory distress. She has no wheezes. She has no rales.  Respirations even and unlabored. Lungs clear to auscultation bilaterally.  Musculoskeletal: Normal range of motion.  Neurological: She is alert and oriented to person, place, and time. She exhibits normal muscle tone. Coordination normal.  GCS 15. Patient moving all extremities.  Skin: Skin is warm and dry. No rash noted. She is not diaphoretic. No erythema. No pallor.  Psychiatric: She has a normal mood and affect. Her behavior is normal.  Nursing note and vitals reviewed.  ED Treatments / Results  DIAGNOSTIC STUDIES: Oxygen Saturation is 94% on RA, adequate by my interpretation.   COORDINATION OF CARE: 2:00 AM-Discussed next steps with pt. Pt verbalized understanding and is agreeable with the plan.   Radiology Dg Chest 2 View  Result Date: 09/15/2016 CLINICAL DATA:  26 year old female with asthma and  shortness of breath EXAM: CHEST  2 VIEW COMPARISON:  Chest radiograph dated 08/11/2016 FINDINGS: The heart size and mediastinal contours are within normal limits. Both lungs are clear. The visualized skeletal structures are unremarkable. IMPRESSION: No active cardiopulmonary disease. Electronically Signed   By: Elgie Collard M.D.   On: 09/15/2016 01:35   Procedures Procedures   Medications Ordered in ED Medications  albuterol (PROVENTIL HFA;VENTOLIN HFA) 108 (90 Base) MCG/ACT inhaler 2 puff (not administered)  albuterol (PROVENTIL) (2.5 MG/3ML) 0.083% nebulizer solution 5 mg (5 mg Nebulization Given 09/15/16 0133)   Initial Impression / Assessment and Plan / ED Course  I have reviewed the triage vital signs and the nursing notes.  Pertinent labs & imaging results that were available during my care of  the patient were reviewed by me and considered in my medical decision making (see chart for details).     26 year old female presents to the emergency department for evaluation of wheezing consistent with asthma exacerbation. Patient states that she tried using her home medication, but was out of her inhaler. She reports that she is now breathing at baseline after a nebulizer treatment given in triage. She has clear lung sounds bilaterally. No signs of respiratory distress. No hypoxia. Chest x-ray negative for acute findings. Plan for outpatient management with albuterol inhaler. Return precautions discussed and provided. Patient discharged in stable condition with no unaddressed concerns.   Final Clinical Impressions(s) / ED Diagnoses   Final diagnoses:  Mild intermittent asthma with exacerbation    New Prescriptions New Prescriptions   No medications on file    I personally performed the services described in this documentation, which was scribed in my presence. The recorded information has been reviewed and is accurate.       Antony MaduraKelly Alecia Doi, PA-C 09/15/16 [id number]    Pricilla LovelessScott Goldston,  MD 09/24/16 [phone]

## 2016-09-15 NOTE — ED Triage Notes (Signed)
Patient reports she is experiencing an asthma attack that started about 30 minutes ago. Pt reports she used her inhaler before arrival. Pt is laughing and smiling in triage with family member.

## 2016-10-11 ENCOUNTER — Emergency Department (HOSPITAL_BASED_OUTPATIENT_CLINIC_OR_DEPARTMENT_OTHER): Payer: Self-pay

## 2016-10-11 ENCOUNTER — Observation Stay (HOSPITAL_COMMUNITY): Payer: Self-pay

## 2016-10-11 ENCOUNTER — Encounter (HOSPITAL_BASED_OUTPATIENT_CLINIC_OR_DEPARTMENT_OTHER): Payer: Self-pay | Admitting: Emergency Medicine

## 2016-10-11 ENCOUNTER — Inpatient Hospital Stay (HOSPITAL_BASED_OUTPATIENT_CLINIC_OR_DEPARTMENT_OTHER)
Admission: EM | Admit: 2016-10-11 | Discharge: 2016-10-17 | DRG: 208 | Disposition: A | Discharge status: Home / Self Care | Payer: 59 | Attending: Internal Medicine | Admitting: Internal Medicine

## 2016-10-11 DIAGNOSIS — J9691 Respiratory failure, unspecified with hypoxia: Secondary | ICD-10-CM

## 2016-10-11 DIAGNOSIS — Z8701 Personal history of pneumonia (recurrent): Secondary | ICD-10-CM

## 2016-10-11 DIAGNOSIS — Z978 Presence of other specified devices: Secondary | ICD-10-CM

## 2016-10-11 DIAGNOSIS — R000 Tachycardia, unspecified: Secondary | ICD-10-CM | POA: Diagnosis not present

## 2016-10-11 DIAGNOSIS — J4541 Moderate persistent asthma with (acute) exacerbation: Secondary | ICD-10-CM | POA: Diagnosis not present

## 2016-10-11 DIAGNOSIS — J181 Lobar pneumonia, unspecified organism: Secondary | ICD-10-CM | POA: Diagnosis not present

## 2016-10-11 DIAGNOSIS — G92 Toxic encephalopathy: Secondary | ICD-10-CM | POA: Diagnosis not present

## 2016-10-11 DIAGNOSIS — K2970 Gastritis, unspecified, without bleeding: Secondary | ICD-10-CM | POA: Diagnosis present

## 2016-10-11 DIAGNOSIS — J9601 Acute respiratory failure with hypoxia: Secondary | ICD-10-CM | POA: Diagnosis present

## 2016-10-11 DIAGNOSIS — Y92239 Unspecified place in hospital as the place of occurrence of the external cause: Secondary | ICD-10-CM | POA: Diagnosis not present

## 2016-10-11 DIAGNOSIS — Z87891 Personal history of nicotine dependence: Secondary | ICD-10-CM

## 2016-10-11 DIAGNOSIS — Z79899 Other long term (current) drug therapy: Secondary | ICD-10-CM

## 2016-10-11 DIAGNOSIS — T380X5A Adverse effect of glucocorticoids and synthetic analogues, initial encounter: Secondary | ICD-10-CM | POA: Diagnosis present

## 2016-10-11 DIAGNOSIS — J4552 Severe persistent asthma with status asthmaticus: Secondary | ICD-10-CM | POA: Diagnosis not present

## 2016-10-11 DIAGNOSIS — B349 Viral infection, unspecified: Secondary | ICD-10-CM | POA: Diagnosis present

## 2016-10-11 DIAGNOSIS — E876 Hypokalemia: Secondary | ICD-10-CM

## 2016-10-11 DIAGNOSIS — D696 Thrombocytopenia, unspecified: Secondary | ICD-10-CM | POA: Diagnosis present

## 2016-10-11 DIAGNOSIS — Z7952 Long term (current) use of systemic steroids: Secondary | ICD-10-CM

## 2016-10-11 DIAGNOSIS — T4275XA Adverse effect of unspecified antiepileptic and sedative-hypnotic drugs, initial encounter: Secondary | ICD-10-CM | POA: Diagnosis not present

## 2016-10-11 DIAGNOSIS — J45901 Unspecified asthma with (acute) exacerbation: Secondary | ICD-10-CM | POA: Diagnosis present

## 2016-10-11 DIAGNOSIS — D509 Iron deficiency anemia, unspecified: Secondary | ICD-10-CM | POA: Diagnosis present

## 2016-10-11 DIAGNOSIS — Z4659 Encounter for fitting and adjustment of other gastrointestinal appliance and device: Secondary | ICD-10-CM

## 2016-10-11 DIAGNOSIS — R739 Hyperglycemia, unspecified: Secondary | ICD-10-CM | POA: Diagnosis present

## 2016-10-11 DIAGNOSIS — J9600 Acute respiratory failure, unspecified whether with hypoxia or hypercapnia: Secondary | ICD-10-CM | POA: Diagnosis present

## 2016-10-11 DIAGNOSIS — Z77120 Contact with and (suspected) exposure to mold (toxic): Secondary | ICD-10-CM

## 2016-10-11 LAB — CBC
HCT: 36.9 % (ref 36.0–46.0)
Hemoglobin: 11.4 g/dL — ABNORMAL LOW (ref 12.0–15.0)
MCH: 22.7 pg — ABNORMAL LOW (ref 26.0–34.0)
MCHC: 30.9 g/dL (ref 30.0–36.0)
MCV: 73.5 fL — ABNORMAL LOW (ref 78.0–100.0)
Platelets: 559 10*3/uL — ABNORMAL HIGH (ref 150–400)
RBC: 5.02 MIL/uL (ref 3.87–5.11)
RDW: 14 % (ref 11.5–15.5)
WBC: 12.9 10*3/uL — ABNORMAL HIGH (ref 4.0–10.5)

## 2016-10-11 LAB — BASIC METABOLIC PANEL
Anion gap: 12 (ref 5–15)
Anion gap: 10 (ref 5–15)
BUN: 8 mg/dL (ref 6–20)
BUN: <5 mg/dL — ABNORMAL LOW (ref 6–20)
CO2: 25 mmol/L (ref 22–32)
CO2: 21 mmol/L — ABNORMAL LOW (ref 22–32)
Calcium: 8.7 mg/dL — ABNORMAL LOW (ref 8.9–10.3)
Calcium: 8 mg/dL — ABNORMAL LOW (ref 8.9–10.3)
Chloride: 102 mmol/L (ref 101–111)
Chloride: 107 mmol/L (ref 101–111)
Creatinine, Ser: 0.85 mg/dL (ref 0.44–1.00)
Creatinine, Ser: 0.84 mg/dL (ref 0.44–1.00)
GFR calc Af Amer: >60 mL/min (ref >60)
GFR calc Af Amer: >60 mL/min (ref >60)
GFR calc non Af Amer: >60 mL/min (ref >60)
GFR calc non Af Amer: >60 mL/min (ref >60)
Glucose, Bld: 128 mg/dL — ABNORMAL HIGH (ref 65–99)
Glucose, Bld: 146 mg/dL — ABNORMAL HIGH (ref 65–99)
Potassium: 2.9 mmol/L — ABNORMAL LOW (ref 3.5–5.1)
Potassium: 4.3 mmol/L (ref 3.5–5.1)
Sodium: 139 mmol/L (ref 135–145)
Sodium: 138 mmol/L (ref 135–145)

## 2016-10-11 LAB — CBC WITH DIFFERENTIAL/PLATELET
Basophils Absolute: 0.1 10*3/uL (ref 0.0–0.1)
Basophils Relative: 1 %
Eosinophils Absolute: 0.2 10*3/uL (ref 0.0–0.7)
Eosinophils Relative: 2 %
HCT: 34.7 % — ABNORMAL LOW (ref 36.0–46.0)
Hemoglobin: 10.9 g/dL — ABNORMAL LOW (ref 12.0–15.0)
Lymphocytes Relative: 14 %
Lymphs Abs: 1.5 10*3/uL (ref 0.7–4.0)
MCH: 23.1 pg — ABNORMAL LOW (ref 26.0–34.0)
MCHC: 31.4 g/dL (ref 30.0–36.0)
MCV: 73.7 fL — ABNORMAL LOW (ref 78.0–100.0)
Monocytes Absolute: 0.3 10*3/uL (ref 0.1–1.0)
Monocytes Relative: 3 %
Neutro Abs: 8.9 10*3/uL — ABNORMAL HIGH (ref 1.7–7.7)
Neutrophils Relative %: 80 %
Platelets: 543 10*3/uL — ABNORMAL HIGH (ref 150–400)
RBC: 4.71 MIL/uL (ref 3.87–5.11)
RDW: 14.2 % (ref 11.5–15.5)
WBC: 11 10*3/uL — ABNORMAL HIGH (ref 4.0–10.5)

## 2016-10-11 LAB — BLOOD GAS, ARTERIAL
Acid-base deficit: 4.8 mmol/L — ABNORMAL HIGH (ref 0.0–2.0)
Acid-base deficit: 6.1 mmol/L — ABNORMAL HIGH (ref 0.0–2.0)
Bicarbonate: 19.8 mmol/L — ABNORMAL LOW (ref 20.0–28.0)
Bicarbonate: 21.4 mmol/L (ref 20.0–28.0)
Delivery systems: POSITIVE
Drawn by: 280981
Drawn by: 418751
Expiratory PAP: 7
FIO2: 70
FIO2: 100
Inspiratory PAP: 14
MECHVT: 400 mL
Mode: POSITIVE
O2 Saturation: 98 %
O2 Saturation: 99.1 %
PEEP: 10 cmH2O
Patient temperature: 98.6
Patient temperature: 98.6
RATE: 10 resp/min
RATE: 16 resp/min
pCO2 arterial: 36.8 mmHg (ref 32.0–48.0)
pCO2 arterial: 62.5 mmHg — ABNORMAL HIGH (ref 32.0–48.0)
pH, Arterial: 7.35 (ref 7.350–7.450)
pH, Arterial: 7.16 — CL (ref 7.350–7.450)
pO2, Arterial: 112 mmHg — ABNORMAL HIGH (ref 83.0–108.0)
pO2, Arterial: 335 mmHg — ABNORMAL HIGH (ref 83.0–108.0)

## 2016-10-11 LAB — I-STAT VENOUS BLOOD GAS, ED
Acid-base deficit: 5 mmol/L — ABNORMAL HIGH (ref 0.0–2.0)
Bicarbonate: 20.4 mmol/L (ref 20.0–28.0)
O2 Saturation: 93 %
Patient temperature: 98
TCO2: 22 mmol/L (ref 0–100)
pCO2, Ven: 36.9 mmHg — ABNORMAL LOW (ref 44.0–60.0)
pH, Ven: 7.349 (ref 7.250–7.430)
pO2, Ven: 67 mmHg — ABNORMAL HIGH (ref 32.0–45.0)

## 2016-10-11 LAB — LACTIC ACID, PLASMA
Lactic Acid, Venous: 4.5 mmol/L (ref 0.5–1.9)
Lactic Acid, Venous: 3.5 mmol/L (ref 0.5–1.9)

## 2016-10-11 LAB — MAGNESIUM: Magnesium: 1.9 mg/dL (ref 1.7–2.4)

## 2016-10-11 LAB — CREATININE, SERUM
Creatinine, Ser: 0.88 mg/dL (ref 0.44–1.00)
GFR calc Af Amer: >60 mL/min (ref >60)
GFR calc non Af Amer: >60 mL/min (ref >60)

## 2016-10-11 LAB — GLUCOSE, CAPILLARY
Glucose-Capillary: 128 mg/dL — ABNORMAL HIGH (ref 65–99)
Glucose-Capillary: 132 mg/dL — ABNORMAL HIGH (ref 65–99)

## 2016-10-11 LAB — PROCALCITONIN: Procalcitonin: <0.1 ng/mL

## 2016-10-11 LAB — HCG, SERUM, QUALITATIVE: Preg, Serum: NEGATIVE

## 2016-10-11 LAB — TRIGLYCERIDES: Triglycerides: 34 mg/dL (ref <150)

## 2016-10-11 LAB — MRSA PCR SCREENING: MRSA by PCR: NEGATIVE

## 2016-10-11 LAB — TROPONIN I: Troponin I: <0.03 ng/mL (ref <0.03)

## 2016-10-11 MED ORDER — DEXTROSE 5 % IV SOLN
500.0000 mg | INTRAVENOUS | Status: DC
  Filled 2016-10-11: qty 500

## 2016-10-11 MED ORDER — ORAL CARE MOUTH RINSE
15.0000 mL | Freq: Four times a day (QID) | OROMUCOSAL | Status: DC
  Administered 2016-10-12 – 2016-10-13 (×6): 15 mL via OROMUCOSAL

## 2016-10-11 MED ORDER — SODIUM CHLORIDE 0.9 % IV BOLUS (SEPSIS)
1000.0000 mL | Freq: Once | INTRAVENOUS | Status: AC
  Administered 2016-10-11: 1000 mL via INTRAVENOUS

## 2016-10-11 MED ORDER — CHLORHEXIDINE GLUCONATE 0.12% ORAL RINSE (MEDLINE KIT)
15.0000 mL | Freq: Two times a day (BID) | OROMUCOSAL | Status: DC
  Administered 2016-10-12 – 2016-10-13 (×4): 15 mL via OROMUCOSAL

## 2016-10-11 MED ORDER — HYDRALAZINE HCL 20 MG/ML IJ SOLN
5.0000 mg | Freq: Once | INTRAMUSCULAR | Status: DC
  Filled 2016-10-11: qty 1

## 2016-10-11 MED ORDER — ACETAMINOPHEN 500 MG PO TABS: 500.0000 mg | ORAL_TABLET | Freq: Four times a day (QID) | ORAL | Status: DC | PRN

## 2016-10-11 MED ORDER — FENTANYL CITRATE (PF) 100 MCG/2ML IJ SOLN
25.0000 ug | Freq: Once | INTRAMUSCULAR | Status: AC
  Administered 2016-10-11: 25 ug via INTRAVENOUS
  Filled 2016-10-11: qty 2

## 2016-10-11 MED ORDER — MIDAZOLAM HCL 2 MG/2ML IJ SOLN
INTRAMUSCULAR | Status: AC
Start: 2016-10-11 — End: 2016-10-11
  Administered 2016-10-11: 2 mg
  Filled 2016-10-11: qty 2

## 2016-10-11 MED ORDER — LEVALBUTEROL HCL 0.63 MG/3ML IN NEBU: 0.6300 mg | INHALATION_SOLUTION | RESPIRATORY_TRACT | Status: DC | PRN

## 2016-10-11 MED ORDER — TRAZODONE HCL 50 MG PO TABS: 25.0000 mg | ORAL_TABLET | Freq: Every evening | ORAL | Status: DC | PRN

## 2016-10-11 MED ORDER — IPRATROPIUM-ALBUTEROL 0.5-2.5 (3) MG/3ML IN SOLN
RESPIRATORY_TRACT | Status: AC
  Administered 2016-10-11: 3 mL via RESPIRATORY_TRACT
  Filled 2016-10-11: qty 3

## 2016-10-11 MED ORDER — FENTANYL CITRATE (PF) 100 MCG/2ML IJ SOLN
100.0000 ug | INTRAMUSCULAR | Status: DC | PRN
  Administered 2016-10-11: 100 ug via INTRAVENOUS
  Filled 2016-10-11 (×2): qty 2

## 2016-10-11 MED ORDER — ONDANSETRON HCL 4 MG/2ML IJ SOLN
4.0000 mg | Freq: Four times a day (QID) | INTRAMUSCULAR | Status: DC | PRN
  Administered 2016-10-11: 4 mg via INTRAVENOUS
  Filled 2016-10-11: qty 2

## 2016-10-11 MED ORDER — METOPROLOL TARTRATE 5 MG/5ML IV SOLN
2.5000 mg | INTRAVENOUS | Status: DC | PRN
  Administered 2016-10-11: 5 mg via INTRAVENOUS
  Administered 2016-10-12: 2.5 mg via INTRAVENOUS
  Filled 2016-10-11 (×2): qty 5

## 2016-10-11 MED ORDER — IBUPROFEN 600 MG PO TABS: 600.0000 mg | ORAL_TABLET | Freq: Four times a day (QID) | ORAL | Status: DC | PRN

## 2016-10-11 MED ORDER — MIDAZOLAM HCL 5 MG/ML IJ SOLN: 4.0000 mg | Freq: Once | INTRAMUSCULAR | Status: DC

## 2016-10-11 MED ORDER — POTASSIUM CHLORIDE 20 MEQ/15ML (10%) PO SOLN: 40.0000 meq | Freq: Once | ORAL | Status: DC

## 2016-10-11 MED ORDER — METHYLPREDNISOLONE SODIUM SUCC 125 MG IJ SOLR
125.0000 mg | Freq: Once | INTRAMUSCULAR | Status: DC
  Filled 2016-10-11: qty 2

## 2016-10-11 MED ORDER — IPRATROPIUM-ALBUTEROL 0.5-2.5 (3) MG/3ML IN SOLN
3.0000 mL | Freq: Four times a day (QID) | RESPIRATORY_TRACT | Status: DC
Start: 2016-10-11 — End: 2016-10-11
  Administered 2016-10-11: 3 mL via RESPIRATORY_TRACT
  Filled 2016-10-11 (×2): qty 3

## 2016-10-11 MED ORDER — MAGNESIUM SULFATE 50 % IJ SOLN
2.0000 g | Freq: Once | INTRAMUSCULAR | Status: AC
  Administered 2016-10-11: 2 g via INTRAVENOUS
  Filled 2016-10-11: qty 4

## 2016-10-11 MED ORDER — PROPOFOL 1000 MG/100ML IV EMUL
0.0000 ug/kg/min | INTRAVENOUS | Status: DC
  Administered 2016-10-11: 5 ug/kg/min via INTRAVENOUS
  Administered 2016-10-12: 40 ug/kg/min via INTRAVENOUS
  Administered 2016-10-12: 30 ug/kg/min via INTRAVENOUS
  Administered 2016-10-12: 60 ug/kg/min via INTRAVENOUS
  Administered 2016-10-12: 40 ug/kg/min via INTRAVENOUS
  Administered 2016-10-13: 15 ug/kg/min via INTRAVENOUS
  Administered 2016-10-13: 40 ug/kg/min via INTRAVENOUS
  Filled 2016-10-11 (×8): qty 100

## 2016-10-11 MED ORDER — ENOXAPARIN SODIUM 40 MG/0.4ML ~~LOC~~ SOLN
40.0000 mg | SUBCUTANEOUS | Status: DC
  Administered 2016-10-12 – 2016-10-16 (×6): 40 mg via SUBCUTANEOUS
  Filled 2016-10-11 (×6): qty 0.4

## 2016-10-11 MED ORDER — POLYETHYLENE GLYCOL 3350 17 G PO PACK: 17.0000 g | PACK | Freq: Every day | ORAL | Status: DC | PRN

## 2016-10-11 MED ORDER — METHYLPREDNISOLONE SODIUM SUCC 125 MG IJ SOLR
125.0000 mg | Freq: Once | INTRAMUSCULAR | Status: AC
  Administered 2016-10-11: 125 mg via INTRAVENOUS

## 2016-10-11 MED ORDER — SODIUM CHLORIDE 0.9% FLUSH: 3.0000 mL | Freq: Two times a day (BID) | INTRAVENOUS | Status: DC

## 2016-10-11 MED ORDER — ALBUTEROL (5 MG/ML) CONTINUOUS INHALATION SOLN
10.0000 mg/h | INHALATION_SOLUTION | Freq: Once | RESPIRATORY_TRACT | Status: AC
  Administered 2016-10-11: 10 mg/h via RESPIRATORY_TRACT
  Filled 2016-10-11: qty 20

## 2016-10-11 MED ORDER — METHYLPREDNISOLONE SODIUM SUCC 125 MG IJ SOLR
60.0000 mg | Freq: Three times a day (TID) | INTRAMUSCULAR | Status: DC
  Administered 2016-10-11 – 2016-10-12 (×2): 60 mg via INTRAVENOUS
  Filled 2016-10-11: qty 0.96
  Filled 2016-10-11 (×2): qty 2

## 2016-10-11 MED ORDER — METHYLPREDNISOLONE SODIUM SUCC 125 MG IJ SOLR: 80.0000 mg | Freq: Three times a day (TID) | INTRAMUSCULAR | Status: DC

## 2016-10-11 MED ORDER — PANTOPRAZOLE SODIUM 40 MG IV SOLR
40.0000 mg | INTRAVENOUS | Status: DC
  Administered 2016-10-11 – 2016-10-13 (×3): 40 mg via INTRAVENOUS
  Filled 2016-10-11 (×3): qty 40

## 2016-10-11 MED ORDER — FENTANYL CITRATE (PF) 100 MCG/2ML IJ SOLN
100.0000 ug | INTRAMUSCULAR | Status: DC | PRN
  Filled 2016-10-11: qty 2

## 2016-10-11 MED ORDER — ETOMIDATE 2 MG/ML IV SOLN
40.0000 mg | Freq: Once | INTRAVENOUS | Status: AC
  Administered 2016-10-11: 40 mg via INTRAVENOUS

## 2016-10-11 MED ORDER — ACETAMINOPHEN 650 MG RE SUPP: 650.0000 mg | Freq: Four times a day (QID) | RECTAL | Status: DC | PRN

## 2016-10-11 MED ORDER — BENZONATATE 100 MG PO CAPS: 100.0000 mg | ORAL_CAPSULE | Freq: Three times a day (TID) | ORAL | Status: DC | PRN

## 2016-10-11 MED ORDER — IPRATROPIUM-ALBUTEROL 0.5-2.5 (3) MG/3ML IN SOLN
3.0000 mL | Freq: Once | RESPIRATORY_TRACT | Status: AC
  Administered 2016-10-11: 3 mL via RESPIRATORY_TRACT

## 2016-10-11 MED ORDER — MIDAZOLAM HCL 2 MG/2ML IJ SOLN
INTRAMUSCULAR | Status: AC
  Administered 2016-10-11: 2 mg
  Filled 2016-10-11: qty 2

## 2016-10-11 MED ORDER — ACETAMINOPHEN 325 MG PO TABS: 650.0000 mg | ORAL_TABLET | Freq: Four times a day (QID) | ORAL | Status: DC | PRN

## 2016-10-11 MED ORDER — ONDANSETRON HCL 4 MG PO TABS: 4.0000 mg | ORAL_TABLET | Freq: Four times a day (QID) | ORAL | Status: DC | PRN

## 2016-10-11 MED ORDER — FENTANYL CITRATE (PF) 100 MCG/2ML IJ SOLN
100.0000 ug | Freq: Once | INTRAMUSCULAR | Status: AC
  Administered 2016-10-11: 100 ug via INTRAVENOUS

## 2016-10-11 MED ORDER — IPRATROPIUM-ALBUTEROL 0.5-2.5 (3) MG/3ML IN SOLN: 3.0000 mL | Freq: Four times a day (QID) | RESPIRATORY_TRACT | Status: DC

## 2016-10-11 MED ORDER — DEXTROSE 5 % IV SOLN
500.0000 mg | INTRAVENOUS | Status: DC
  Administered 2016-10-12: 500 mg via INTRAVENOUS
  Filled 2016-10-11 (×2): qty 500

## 2016-10-11 MED ORDER — IPRATROPIUM-ALBUTEROL 0.5-2.5 (3) MG/3ML IN SOLN
3.0000 mL | RESPIRATORY_TRACT | Status: DC
  Administered 2016-10-11: 3 mL via RESPIRATORY_TRACT
  Filled 2016-10-11: qty 3

## 2016-10-11 MED ORDER — FENTANYL CITRATE (PF) 100 MCG/2ML IJ SOLN
50.0000 ug | Freq: Once | INTRAMUSCULAR | Status: AC
  Administered 2016-10-11: 50 ug via INTRAVENOUS
  Filled 2016-10-11: qty 2

## 2016-10-11 MED ORDER — DEXTROSE 5 % IV SOLN
1.0000 g | INTRAVENOUS | Status: DC
  Administered 2016-10-11 – 2016-10-13 (×3): 1 g via INTRAVENOUS
  Filled 2016-10-11 (×5): qty 10

## 2016-10-11 MED ORDER — IPRATROPIUM BROMIDE 0.02 % IN SOLN: 0.5000 mg | RESPIRATORY_TRACT | Status: DC | PRN

## 2016-10-11 NOTE — Progress Notes (Signed)
Latic acid 4.5, MD on call paged.

## 2016-10-11 NOTE — Progress Notes (Signed)
Pt arrived from [facility]. Pt on bipap 02 sats in the 90's and resp in the 30's. Pt oriented to room. Will cont to monitor pt.

## 2016-10-11 NOTE — Procedures (Signed)
Intubation Procedure Note Andrea Daniels [id number] 1991/03/16  Procedure: Intubation Indications: Respiratory insufficiency  Procedure Details Consent: Obtained from patient and family.  Time Out: Verified patient identification, verified procedure, site/side was marked, verified correct patient position, special equipment/implants available, medications/allergies/relevent history reviewed, required imaging and test results available.  Performed  Medications: Versed 4 mg Fentanyl 200 mg Etomidate 40 mg   Maximum sterile technique was used including gloves, gown, hand hygiene and mask.  MAC-3 glidescope.   7.5 endotracheal tube placed under DL using Mac 3 glidescope. Grade 1 view. ET tube passed easily on first attempt. Position confirmed by auscultation, in tidal CO2, direct visualization.    Evaluation Hemodynamic Status: BP stable throughout; O2 sats: stable throughout Patient's Current Condition: stable Complications: No apparent complications Patient did tolerate procedure well. Chest X-ray ordered to verify placement.  CXR: pending.   Baltazar Apo, MD, PhD 10/11/2016, 8:35 PM [facility] [phone] or if no answer [phone]

## 2016-10-11 NOTE — Progress Notes (Signed)
CRITICAL VALUE ALERT  Critical value received: Lactic acid 4.5  Date of notification:  10/11/2016  Time of notification:  1840  Critical value read back: yes  Nurse who received alert: Mellody Dance  MD notified (1st page): Konrad Dolores Time of first page:  1845  MD notified (2nd page):  Time of second page:  Responding [id number]  Time MD responded: [phone]

## 2016-10-11 NOTE — ED Notes (Signed)
ED Provider at bedside. 

## 2016-10-11 NOTE — Progress Notes (Signed)
Pt resting in bed on bipap. Pt continues to say she feel very tired. Respiratory therapist came up to assess pt. PA on call paged. Pt c/o of feeling nauseated. Prn Zofran given. Will cont to monitor pt.

## 2016-10-11 NOTE — ED Notes (Addendum)
Pt removed off Bipap around 1120 after  Albuterol CAT completed. Pt states "I feel some better" After being off Bipap for about 5 minutes increase WOB noted. Decreased SpO2 and tachycardia. Pt states "I felt better on the machine."  Pt placed back on bipap and MD made aware.

## 2016-10-11 NOTE — Consult Note (Addendum)
PULMONARY / CRITICAL CARE MEDICINE   Name: Andrea Daniels MRN: [id number] DOB: 1990-09-13    ADMISSION DATE:  10/11/2016 CONSULTATION DATE:  10/11/16  REFERRING MD:  Konrad Dolores  CHIEF COMPLAINT:  SOB  HISTORY OF PRESENT ILLNESS:  Pt is in respiratory distress; therefore, this HPI is obtained from chart review. Andrea Daniels is a 26 y.o. female with PMH as outlined below including poorly controlled Asthma.  She had admission in December 2017 for CAP and asthma exacerbation. She was brought to [facility] 04/17 with SOB and progressive wheezing.  In ED, she required BiPAP for increased WOB after standard therapies including CAT, IV mag, steroids.  She was subsequently transferred to [facility] for further evaluation and management.  After arrival to [facility], she had continued increased WOB despite BiPAP; therefore, PCCM was called for further evaluation and concerns that she may require intubation.  PAST MEDICAL HISTORY :  She  has a past medical history of Asthma; Gastritis; Gastritis; Ovarian cyst; and UTI (lower urinary tract infection).  PAST SURGICAL HISTORY: She  has a past surgical history that includes No past surgeries.  No Known Allergies  No current facility-administered medications on file prior to encounter.    Current Outpatient Prescriptions on File Prior to Encounter  Medication Sig  . acetaminophen (TYLENOL) 500 MG tablet Take 500 mg by mouth every 6 (six) hours as needed for mild pain.  Marland Kitchen albuterol (PROVENTIL HFA;VENTOLIN HFA) 108 (90 Base) MCG/ACT inhaler Inhale 2 puffs into the lungs every 4 (four) hours as needed for wheezing or shortness of breath.  . benzonatate (TESSALON) 100 MG capsule Take 1 capsule (100 mg total) by mouth 3 (three) times daily as needed for cough.  Marland Kitchen HYDROcodone-homatropine (HYCODAN) 5-1.5 MG/5ML syrup Take 5 mLs by mouth every 6 (six) hours as needed for cough.  Marland Kitchen ibuprofen (ADVIL,MOTRIN) 600 MG tablet Take 1 tablet (600 mg total) by mouth every 6 (six) hours as  needed for mild pain or moderate pain.  Marland Kitchen loratadine (CLARITIN) 10 MG tablet Take 1 tablet (10 mg total) by mouth daily.  . ondansetron (ZOFRAN ODT) 4 MG disintegrating tablet Take 1 tablet (4 mg total) by mouth every 8 (eight) hours as needed for nausea or vomiting.  . predniSONE (DELTASONE) 20 MG tablet Take 2 tablets (40 mg total) by mouth daily.    FAMILY HISTORY:  Her indicated that only one of her three others is alive.    SOCIAL HISTORY: She  reports that she has quit smoking. Her smoking use included Cigarettes. She smoked 0.10 packs per day. She has never used smokeless tobacco. She reports that she drinks alcohol. She reports that she does not use drugs.  REVIEW OF SYSTEMS:  Unable to obtain as pt is on BiPAP and in respiratory distress  SUBJECTIVE:  On BiPAP, increased WOB.  VITAL SIGNS: BP (!) 170/122   Pulse (!) 132   Temp 98.3 F (36.8 C) (Axillary)   Resp (!) 36   Ht  (1.499 m)   Wt 152 lb (68.9 kg)   LMP 09/23/2016   SpO2 100%   BMI 30.70 kg/m   HEMODYNAMICS:    VENTILATOR SETTINGS: FiO2 (%):  [70 %] 70 %  INTAKE / OUTPUT: I/O last 3 completed shifts: In: 50 [I.V.:50] Out: -    PHYSICAL EXAMINATION: General: Young AA female, in respiratory distress. Neuro: Awake, nods head in response to questions, no focal deficits. HEENT: [place]/AT. BiPAP in place. Cardiovascular: Tachy, regular, No M/R/G. Lungs: Clear bilaterally,  slightly decreased air entry bilaterally. Abdomen: Obese, BS x 4, S/NT/ND. Musculoskeletal: No deformities, no edema. Skin: Warm, dry.  LABS:  BMET  Recent Labs Lab 10/11/16 1023 10/11/16 1727  NA 139  --   K 2.9*  --   CL 102  --   CO2 25  --   BUN 8  --   CREATININE 0.85 0.88  GLUCOSE 128*  --     Electrolytes  Recent Labs Lab 10/11/16 1023 10/11/16 1727  CALCIUM 8.7*  --   MG  --  1.9    CBC  Recent Labs Lab 10/11/16 1023 10/11/16 1727  WBC 11.0* 12.9*  HGB 10.9* 11.4*  HCT 34.7* 36.9  PLT 543*  559*    Coag's No results for input(s): APTT, INR in the last 168 hours.  Sepsis Markers  Recent Labs Lab 10/11/16 1727  LATICACIDVEN 4.5*    ABG  Recent Labs Lab 10/11/16 1842  PHART 7.350  PCO2ART 36.8  PO2ART 112*    Liver Enzymes No results for input(s): AST, ALT, ALKPHOS, BILITOT, ALBUMIN in the last 168 hours.  Cardiac Enzymes  Recent Labs Lab 10/11/16 1808  TROPONINI <0.03    Glucose No results for input(s): GLUCAP in the last 168 hours.  Imaging Dg Chest Port 1 View  Result Date: 10/11/2016 CLINICAL DATA:  26 y/o  F; acute respiratory failure with hypoxemia. EXAM: PORTABLE CHEST 1 VIEW COMPARISON:  10/11/2016 chest radiograph. FINDINGS: Stable normal cardiac silhouette. No acute osseous abnormality is evident. Ill-defined right lower lobe opacity, possibly represent atelectasis or pneumonia. No pleural effusion or pneumothorax. IMPRESSION: Ill-defined right lower lobe opacity, possibly atelectasis or pneumonia. Electronically Signed   By: Mitzi Hansen M.D.   On: 10/11/2016 17:38   Dg Chest Port 1 View  Result Date: 10/11/2016 CLINICAL DATA:  Extreme shortness of breath.  Asthma. EXAM: PORTABLE CHEST 1 VIEW COMPARISON:  09/15/2016 FINDINGS: Chronic small area of obscured right diaphragmatic apex. There is no edema, consolidation, effusion, or pneumothorax. Normal lung volumes. Normal heart size and mediastinal contours. IMPRESSION: Negative portable chest. Electronically Signed   By: Marnee Spring M.D.   On: 10/11/2016 10:21     STUDIES:  CXR 4/17 > RLL opacity.  CULTURES: Blood 4/17 >  Sputum 4/17 >   ANTIBIOTICS: Ceftriaxone 4/17 >  Azithromycin 4/17 >   SIGNIFICANT EVENTS: 4/17 > admit.  LINES/TUBES: ETT 4/17 >   DISCUSSION: 26 y.o. female with hx of poorly controlled Asthma, admitted 4/17 with CAP and asthma exacerbation.  She failed BiPAP and ultimately required intubation.  ASSESSMENT / PLAN:  PULMONARY A: Acute  hypoxic respiratory failure - multifactorial due to CAP + acute asthma exacerbation. CAP. Asthma exacerbation - in poorly controlled asthmatic. P:   Transfer to ICU and intubate now. Assess ABG. Wean as able. VAP prevention measures. SBT in AM if able. Continue abx, steroids, BD's. CXR in AM. Needs to establish with pulmonary as outpatient for further management.  CARDIOVASCULAR A:  Sinus tach. P:  Lopressor PRN.   RENAL A:   Hypokalemia - s/p repletion. P:   NS @ 100. Repeat BMP tonight and in AM.  GASTROINTESTINAL A:   Hx gastritis. Nutrition. P:   SUP: Pantoprazole. NPO.  HEMATOLOGIC A:   Thrombocytopenia. VTE Prophylaxis. P:  SCD's / lovenox. CBC in AM.  INFECTIOUS A:   CAP. P:   Abx as above (Ceftriaxone / Azithromycin).  Follow cultures as above. PCT algorithm to limit abx exposure.  ENDOCRINE A:  No acute issues.   P:   No interventions required.  NEUROLOGIC A:   Acute encephalopathy - due to sedation. P:   Sedation:  Propofol gtt / Fentanyl PRN. RASS goal: 0 to -1. Daily WUA.  Family updated: Mother and Brother updated at bedside.  Interdisciplinary Family Meeting v Palliative Care Meeting:  Due by: 10/17/16.  CC time: 30 min.   Rutherford Guys, [place] - [facility] Pager: [phone]  or [phone] 10/11/2016, 7:59 PM   Attending Note:  I have examined patient, reviewed labs, studies and notes. I have discussed the case with Ihor Dow, and I agree with the data and plans as amended above. 26 yo woman with a hx of asthma based on her hx and the notes. I do not see any PFt available to review in our system. She has many ED visits and exacerbations in the record, note poor control. She was evaluated at [facility] on 4/17 with wheezing, severe dyspnea. She was treated with corticosteroids, bronchodilators, magnesium. She was placed on noninvasive positive pressure ventilation to stabilize her  work of breathing. She was admitted to [facility] on BiPAP support. Through the afternoon and evening she has been more tachypneic, displaying evidence of an increased work to breathe. On my evaluation this evening she is profoundly dyspneic, is using accessory muscles. She is able to speak but only in partial sentences. She has BiPAP in place. She has small breaths with coarse breath sounds but no overt wheezing. Abdomen is obese, soft. Heart tachycardic, regular. No significant edema. She shows evidence of impending respiratory failure. She will need to be intubated, mechanically ventilated. I discussed this with her and also her mother and brother at the bedside. We will continue her current therapy for her apparent asthma exacerbation. A chest x-ray has been performed that showed a possible right lower lobe infiltrate. Review of her CT scan of the abdomen from December 2017 shows that there was a similar opacity at that time. Unclear whether this is acute or chronic. Suspect that it is chronic. Covering with empiric abx for CAP. lactic acid elevated, ? Due to WOB. She has not had any prolonged hypoxemia or hypotension. Independent critical care time is 45 minutes.   Levy Pupa, MD, PhD 10/11/2016, 8:11 PM [facility] [phone] or if no answer [phone]

## 2016-10-11 NOTE — ED Triage Notes (Signed)
Pt to ED via EMS w/ c/o SHOB/asthma; received Duo Neb x 2 and Solu-Medrol  IV

## 2016-10-11 NOTE — ED Provider Notes (Signed)
Pt reporting chest wall discomfort from cough but overall improved Bed is now ready at [facility] BP (!) 146/87   Pulse (!) 101   Temp 98.6 F (37 C) (Axillary)   Resp (!) 27   Ht  (1.499 m)   Wt 68.9 kg   SpO2 96%   BMI 30.70 kg/m  She will transferred soon    Zadie Rhine, MD 10/11/16 1444

## 2016-10-11 NOTE — Significant Event (Signed)
Rapid Response Event Note  Overview: Time Called: 1641 Arrival Time: 1644 Event Type: Respiratory  Initial Focused Assessment: Patient recently arrived from [facility] in acute respiratory distress/Asthmatic   She has been on Bipap since about 10am this morning with brief trials off bipap.  Which she did not tolerate  BP 170/122  ST 120s-140  RR 25-30s  O2 sat 96% on Bipap 70% fio2  She has some mild anxiety and states that she feels like she can't get her breath.  Lung sounds diminished bases regular heart tones.  Dr Konrad Dolores at bedside to assess patient   Interventions: Duoneb Solumedrol Abx NS bolus  1800 Patient still with increased work of breathing, ST HTN, she states that she is getting tired and does not feel like she can get a good breath.  CCM consulted  1945  Dr Delton Coombes at bedside to assess patient  2000 Patient transferred to 2M14 via bed with Bipap and heart monitor. Plan for intubation  Mother and Brother at bedside during event.  Plan of Care (if not transferred):  Event Summary: Name of Physician Notified: Dr Konrad Dolores at [phone]  Name of Consulting Physician Notified: CCM/ Joneen Roach at 1815     Event End Time: 2030  Marcellina Millin

## 2016-10-11 NOTE — H&P (Signed)
History and Physical    Andrea Daniels WUJ:[id number] DOB: 25-May-1991 DOA: 10/11/2016  PCP: No PCP Per Patient   Patient coming from:  [facility]  Chief Complaint: Progressive shortness of breath HPI: Andrea Daniels is a 26 y.o. female with medical history significant of asthma, gastritis, ovarian cyst who presented to the [facility] with complaints of progressive shortness of breath and wheezing. Patient was delivered by EMS with past medical facility in severe respiratory distress. He was given 2 separate nebulizer treatments and Solu-Medrol and still had difficulties to previous.  Patient denied chest pain, shortness of breath, cough productive of sputum. She was admitted for community-acquired pneumonia in December 2017 and during that time also was diagnosed with asthma exacerbation and discharged on albuterol MDI and steroids  ED Course: In the emergency department she was placed on continuous nebulizer, given IV magnesium sulfate and placed on BiPAP. Her condition improved Marginally, but with every attempt to discontinue BiPAP patient was becoming severely short of breath and was wheezing. Given this inability to be weaned off BiPAP patient was transferred to [facility] as a direct admission On arrival she was tachycardic with heart rate reaching 156 bpm, tachypneic with respirations of 34/m, her pressure was elevated-181/112 mmHg and O2 saturation on supplemental  Oxygen via nasal cannula was 86%  Blood work demonstrated leukocytosis with white blood cells count of 11,000, mild anemia with hemoglobin 10 0.9, thrombocythemia with platelet count of 543 hypokalemia with potassium 2.9  Review of Systems: As per HPI otherwise 10 point review of systems negative.   Ambulatory Status: Independent in her ambulation prior to admission  Past Medical History:  Diagnosis Date  . Asthma   . Gastritis   . Gastritis   . Ovarian cyst   . UTI (lower urinary  tract infection)     Past Surgical History:  Procedure Laterality Date  . NO PAST SURGERIES      Social History   Social History  . Marital status: Single    Spouse name: N/A  . Number of children: N/A  . Years of education: 72   Occupational History  . Not on file.   Social History Main Topics  . Smoking status: Former Smoker    Packs/day: 0.10    Types: Cigarettes  . Smokeless tobacco: Never Used  . Alcohol use Yes     Comment: Once a week   . Drug use: No  . Sexual activity: Yes    Birth control/ protection: None   Other Topics Concern  . Not on file   Social History Narrative  . No narrative on file    No Known Allergies  Family History  Problem Relation Age of Onset  . Arthritis Other   . Cancer Other     breast cancer  . Hypertension Other   . Asthma Other   . Hyperlipidemia Other   . Hypertension Other   . Diabetes Other     Prior to Admission medications   Medication Sig Start Date End Date Taking? Authorizing Provider  acetaminophen (TYLENOL) 500 MG tablet Take 500 mg by mouth every 6 (six) hours as needed for mild pain.    Historical Provider, MD  albuterol (PROVENTIL HFA;VENTOLIN HFA) 108 (90 Base) MCG/ACT inhaler Inhale 2 puffs into the lungs every 4 (four) hours as needed for wheezing or shortness of breath. 11/13/15   Zadie Rhine, MD  benzonatate (TESSALON) 100 MG capsule Take 1 capsule (100 mg  total) by mouth 3 (three) times daily as needed for cough. 08/07/16   Antony Madura, PA-C  HYDROcodone-homatropine [facility]) 5-1.5 MG/5ML syrup Take 5 mLs by mouth every 6 (six) hours as needed for cough. 08/07/16   Antony Madura, PA-C  ibuprofen (ADVIL,MOTRIN) 600 MG tablet Take 1 tablet (600 mg total) by mouth every 6 (six) hours as needed for mild pain or moderate pain. 08/07/16   Antony Madura, PA-C  loratadine (CLARITIN) 10 MG tablet Take 1 tablet (10 mg total) by mouth daily. 08/07/16   Antony Madura, PA-C  ondansetron (ZOFRAN ODT) 4 MG disintegrating  tablet Take 1 tablet (4 mg total) by mouth every 8 (eight) hours as needed for nausea or vomiting. 08/07/16   Antony Madura, PA-C  predniSONE (DELTASONE) 20 MG tablet Take 2 tablets (40 mg total) by mouth daily. 08/12/16   Laurence Spates, MD    Physical Exam: Vitals:   10/11/16 1430 10/11/16 1445 10/11/16 1450 10/11/16 1640  BP: (!) 155/97 (!) 168/118  (!) 157/101  Pulse: (!) 114 (!) 115  (!) 115  Resp: (!) 29 (!) 34    Temp:    98.3 F (36.8 C)  TempSrc:    Axillary  SpO2: 96% 99% 95% 98%  Weight:      Height:         General: Appears anxious and in respiratory distress with rapid respirations, on BiPAP Eyes: PERRLA, EOMI, normal lids, iris ENT:  grossly normal hearing, lips & tongue, mucous membranes moist and intact Neck: no lymphoadenopathy, masses or thyromegaly Cardiovascular: RRR, no m/r/g. No JVD, carotid bruits. No LE edema.  Respiratory: bilateral diffuse wheezes and rhonchi in upper lungs and basilar crackles, prolonged respiratory phase with labored breathing. No accessory muscle use observed Abdomen: soft, non-tender, non-distended, no organomegaly or masses appreciated. BS present in all quadrants Skin: no rash, ulcers or induration seen on limited exam Musculoskeletal: grossly normal tone BUE/BLE, good ROM, no bony abnormality or joint deformities observed Psychiatric: grossly normal mood and affect, speech fluent and appropriate, alert and oriented x3 Neurologic: CN II-XII grossly intact, moves all extremities in coordinated fashion, sensation intact  Labs on Admission: I have personally reviewed following labs and imaging studies  CBC, BMP  GFR: Estimated Creatinine Clearance: 84.7 mL/min (by C-G formula based on SCr of 0.85 mg/dL).   Creatinine Clearance: Estimated Creatinine Clearance: 84.7 mL/min (by C-G formula based on SCr of 0.85 mg/dL).    Radiological Exams on Admission: Dg Chest Port 1 View  Result Date: 10/11/2016 CLINICAL DATA:  Extreme  shortness of breath.  Asthma. EXAM: PORTABLE CHEST 1 VIEW COMPARISON:  09/15/2016 FINDINGS: Chronic small area of obscured right diaphragmatic apex. There is no edema, consolidation, effusion, or pneumothorax. Normal lung volumes. Normal heart size and mediastinal contours. IMPRESSION: Negative portable chest. Electronically Signed   By: Marnee Spring M.D.   On: 10/11/2016 10:21    EKG: Independently reviewed - sinus rhythm tachycardia with mild diffuse ST depression most likely associated respiratory disstress Assessment/Plan Principal Problem:   Acute respiratory failure with hypoxia (HCC) Active Problems:   Asthma with exacerbation   Tachycardia   Hypokalemia    Acute respiratory failure with hypoxia - secondary to asthma exacerbation Currently on BiPAP, continue to follow-up closely her respiratory status and wean off BiPAP as tolerated Refresh chest xray, obtain respiratory viral panel and Procalcitonin  Asthma exacerbation Continue nebulizer treatment, repeat magnesium IV if patient's status is not improving Continue IV steroid Patient needs outpatient referral to Pulmonology  for better management her asthma. She was hospitalized with asthma exacerbation indecember and reports frequent asthma attacks at home Her only home medication is Albuterol inhaler  Tachycardia-associated with respiratory distress EKG showed sinus tachycardia with mild ST depression diffusely, which could be associated with demand ischemia Continue to observe on telemetry, might use calcium channel blocker if needed for rate and blood pressure control  Hypokalemia Replace and recheck   DVT prophylaxis: Lovenox Code Status: full Family Communication: none Disposition Plan: stepdown Consults called: none Admission status: inpatient   Raymon Mutton, [place] Pager: [phone] [facility]  If 7PM-7AM, please contact night-coverage [email or web address] Password [facility]  10/11/2016, 4:52 PM

## 2016-10-11 NOTE — ED Provider Notes (Signed)
Pt with some worsening of dyspnea Wheeze noted Will place back on neb treatment Pt awake/alert at this time    Zadie Rhine, MD 10/11/16 1309

## 2016-10-11 NOTE — ED Notes (Signed)
  Albuterol CAT completed. Pt removed at bipap at this time.

## 2016-10-11 NOTE — ED Provider Notes (Signed)
[facility] Provider Note   CSN: [id number] Arrival date & time: 10/11/16  1000     History   Chief Complaint Chief Complaint  Patient presents with  . Asthma   LEVEL 5 CAVEAT DUE TO RESPIRATORY DISTRESS  HPI Andrea Daniels is a 26 y.o. female.  The history is provided by the patient and the EMS personnel. The history is limited by the condition of the patient.  Asthma  This is a new problem. Episode onset: unknown. The problem occurs constantly. The problem has been rapidly worsening. Associated symptoms include shortness of breath. Nothing aggravates the symptoms. Nothing relieves the symptoms.  Patient with known h/o asthmas presents with severe episodes of wheezing/shortness of breath Per EMS, she was picked up at a local [facility] in respiratory distress She was given 2 separate duoneb treatments and solumedrol She is still having difficulty breathing No other details are known  Past Medical History:  Diagnosis Date  . Asthma   . Gastritis   . Gastritis   . Ovarian cyst   . UTI (lower urinary tract infection)     Patient Active Problem List   Diagnosis Date Noted  . CAP (community acquired pneumonia) 06/03/2016  . Epigastric abdominal pain   . Tachycardia 06/02/2016  . Leukocytosis 06/02/2016  . Community acquired pneumonia of right lung 06/02/2016  . Asthma 06/02/2016  . Hypoxia   . Hypophosphatemia 03/20/2015  . Low TSH level 03/20/2015  . Metabolic acidosis 03/20/2015  . Microcytic anemia 03/20/2015  . Exacerbation of asthma 03/19/2015  . Elevated glucose 07/03/2014  . Asthma with exacerbation 10/22/2013  . Hives 07/11/2013  . Status asthmaticus 06/08/2013  . Nausea vomiting and diarrhea 08/16/2012  . Allergic rhinitis 04/24/2012  . Preventative health care 08/14/2011    Past Surgical History:  Procedure Laterality Date  . NO PAST SURGERIES      OB History    Gravida Para Term Preterm AB Living   0             SAB TAB Ectopic  Multiple Live Births                   Home Medications    Prior to Admission medications   Medication Sig Start Date End Date Taking? Authorizing Provider  acetaminophen (TYLENOL) 500 MG tablet Take 500 mg by mouth every 6 (six) hours as needed for mild pain.    Historical Provider, MD  albuterol (PROVENTIL HFA;VENTOLIN HFA) 108 (90 Base) MCG/ACT inhaler Inhale 2 puffs into the lungs every 4 (four) hours as needed for wheezing or shortness of breath. 11/13/15   Zadie Rhine, MD  benzonatate (TESSALON) 100 MG capsule Take 1 capsule (100 mg total) by mouth 3 (three) times daily as needed for cough. 08/07/16   Antony Madura, PA-C  HYDROcodone-homatropine [facility]) 5-1.5 MG/5ML syrup Take 5 mLs by mouth every 6 (six) hours as needed for cough. 08/07/16   Antony Madura, PA-C  ibuprofen (ADVIL,MOTRIN) 600 MG tablet Take 1 tablet (600 mg total) by mouth every 6 (six) hours as needed for mild pain or moderate pain. 08/07/16   Antony Madura, PA-C  loratadine (CLARITIN) 10 MG tablet Take 1 tablet (10 mg total) by mouth daily. 08/07/16   Antony Madura, PA-C  ondansetron (ZOFRAN ODT) 4 MG disintegrating tablet Take 1 tablet (4 mg total) by mouth every 8 (eight) hours as needed for nausea or vomiting. 08/07/16   Antony Madura, PA-C  predniSONE (DELTASONE) 20 MG tablet Take 2  tablets (40 mg total) by mouth daily. 08/12/16   Laurence Spates, MD    Family History Family History  Problem Relation Age of Onset  . Arthritis Other   . Cancer Other     breast cancer  . Hypertension Other   . Asthma Other   . Hyperlipidemia Other   . Hypertension Other   . Diabetes Other     Social History Social History  Substance Use Topics  . Smoking status: Former Smoker    Packs/day: 0.10    Types: Cigarettes  . Smokeless tobacco: Never Used  . Alcohol use Yes     Comment: Once a week      Allergies   Patient has no known allergies.   Review of Systems Review of Systems  Unable to perform ROS: Severe  respiratory distress  Respiratory: Positive for shortness of breath.      Physical Exam Updated Vital Signs Pulse (!) 156   Resp (!) 32 Comment: RT at bedside  Ht  (1.499 m)   SpO2 98%   Physical Exam CONSTITUTIONAL: Diaphoretic, ill appearing, respiratory distress noted HEAD: Normocephalic/atraumatic ENMT: Mucous membranes moist, no angioedema, nebulizer mask in place NECK: supple no meningeal signs SPINE/BACK:entire spine nontender CV: tachycardic, no loud murmurs LUNGS: respiratory distress noted, tachypnea noted, wheezing bilaterally, she is unable to speak ABDOMEN: soft, nontender GU:no cva tenderness NEURO: Pt is awake/alert moves all extremitiesx4.   EXTREMITIES: pulses normal/equal, full ROM, no calf tenderness/edema SKIN: diaphoretic PSYCH: anxious  ED Treatments / Results  Labs (all labs ordered are listed, but only abnormal results are displayed) Labs Reviewed  BASIC METABOLIC PANEL - Abnormal; Notable for the following:       Result Value   Potassium 2.9 (*)    Glucose, Bld 128 (*)    Calcium 8.7 (*)    All other components within normal limits  CBC WITH DIFFERENTIAL/PLATELET - Abnormal; Notable for the following:    WBC 11.0 (*)    Hemoglobin 10.9 (*)    HCT 34.7 (*)    MCV 73.7 (*)    MCH 23.1 (*)    Platelets 543 (*)    All other components within normal limits  HCG, SERUM, QUALITATIVE    EKG  EKG Interpretation  Date/Time:  Tuesday October 11 2016 10:09:19 EDT Ventricular Rate:  135 PR Interval:    QRS Duration: 85 QT Interval:  282 QTC Calculation: 423 R Axis:   79 Text Interpretation:  Sinus tachycardia Repol abnrm suggests ischemia, diffuse leads Baseline wander in lead(s) V3 V4 V5 No significant change since last tracing except for tachycardia Confirmed by Bebe Shaggy  MD, Neymar Dowe ([id number]) on 10/11/2016 10:13:03 AM       Radiology Dg Chest Port 1 View  Result Date: 10/11/2016 CLINICAL DATA:  Extreme shortness of breath.  Asthma.  EXAM: PORTABLE CHEST 1 VIEW COMPARISON:  09/15/2016 FINDINGS: Chronic small area of obscured right diaphragmatic apex. There is no edema, consolidation, effusion, or pneumothorax. Normal lung volumes. Normal heart size and mediastinal contours. IMPRESSION: Negative portable chest. Electronically Signed   By: Marnee Spring M.D.   On: 10/11/2016 10:21    Procedures Procedures  CRITICAL CARE Performed by: Joya Gaskins Total critical care time: 45 minutes Critical care time was exclusive of separately billable procedures and treating other patients. Critical care was necessary to treat or prevent imminent or life-threatening deterioration. Critical care was time spent personally by me on the following activities: development of treatment plan with patient and/or  surrogate as well as nursing, discussions with consultants, evaluation of patient's response to treatment, examination of patient, obtaining history from patient or surrogate, ordering and performing treatments and interventions, ordering and review of laboratory studies, ordering and review of radiographic studies, pulse oximetry and re-evaluation of patient's condition. PATIENT WITH STATUS ASTHMATICUS REQUIRING CONTINUOUS NEBULIZER AND BIPAP   Medications Ordered in ED Medications  fentaNYL (SUBLIMAZE) injection 50 mcg (not administered)  albuterol (PROVENTIL,VENTOLIN) solution continuous neb (10 mg/hr Nebulization Given 10/11/16 1029)  magnesium sulfate (IV Push/IM) injection 2 g (2 g Intravenous Given 10/11/16 1032)     Initial Impression / Assessment and Plan / ED Course  I have reviewed the triage vital signs and the nursing notes.  Pertinent labs & imaging results that were available during my care of the patient were reviewed by me and considered in my medical decision making (see chart for details).     10:09 AM Pt seen on arrival for severe resp distress I immediately ordered Bipap and continuous nebulized  treatment Will follow closely 10:23 AM Pt appears improved HR improved Workup pending at this time Her mental status is appropriate 10:42 AM Pt tolerating Bipap She is able to speak to me  She reports symptoms started this morning 10:58 AM  Will trial off bipap soon.  Pt will likely need admission due to severity of illness BP (!) 181/112   Pulse (!) 129   Temp 98.6 F (37 C) (Axillary)   Resp (!) 29   Ht  (1.499 m)   Wt 68.9 kg   SpO2 97%   BMI 30.70 kg/m  11:48 AM Pt failed trial off Bipap Will need admission She is awake/alert Reports mild HA but no other complaints Will admit 12:00 PM D/w dr Konrad Dolores He will accept to [facility] stepdown unit Will keep patient on bipap  Final Clinical Impressions(s) / ED Diagnoses   Final diagnoses:  Severe persistent asthma with status asthmaticus  Hypokalemia  Acute respiratory failure with hypoxia [facility])    New Prescriptions New Prescriptions   No medications on file     Zadie Rhine, MD 10/11/16 1201

## 2016-10-11 NOTE — Progress Notes (Signed)
   Patient coming from [facility] for acute respiratory failure. Patient suffering from an acute asthma exacerbation. Despite treatment with a continuous albuterol nebulizer treatment, Solu-Medrol, magnesium patient was not able to remain off BiPAP for more than 5 minutes. Patient is accepted to a stepdown bed at [facility] under observation status. Workup unremarkable for other underlying etiology such as pneumonia or COPD exacerbation.   Shelly Flatten, MD [facility] 10/11/2016, 12:02 PM

## 2016-10-12 ENCOUNTER — Observation Stay (HOSPITAL_COMMUNITY): Payer: Self-pay

## 2016-10-12 DIAGNOSIS — J9601 Acute respiratory failure with hypoxia: Secondary | ICD-10-CM | POA: Diagnosis not present

## 2016-10-12 DIAGNOSIS — J9600 Acute respiratory failure, unspecified whether with hypoxia or hypercapnia: Secondary | ICD-10-CM | POA: Diagnosis present

## 2016-10-12 LAB — RESPIRATORY PANEL BY PCR

## 2016-10-12 LAB — POCT I-STAT 3, ART BLOOD GAS (G3+)
Bicarbonate: 24.7 mmol/L (ref 20.0–28.0)
O2 Saturation: 96 %
Patient temperature: 99.8
TCO2: 26 mmol/L (ref 0–100)
pCO2 arterial: 38.9 mmHg (ref 32.0–48.0)
pH, Arterial: 7.413 (ref 7.350–7.450)
pO2, Arterial: 86 mmHg (ref 83.0–108.0)

## 2016-10-12 LAB — GLUCOSE, CAPILLARY
Glucose-Capillary: 125 mg/dL — ABNORMAL HIGH (ref 65–99)
Glucose-Capillary: 128 mg/dL — ABNORMAL HIGH (ref 65–99)
Glucose-Capillary: 133 mg/dL — ABNORMAL HIGH (ref 65–99)
Glucose-Capillary: 137 mg/dL — ABNORMAL HIGH (ref 65–99)
Glucose-Capillary: 139 mg/dL — ABNORMAL HIGH (ref 65–99)
Glucose-Capillary: 142 mg/dL — ABNORMAL HIGH (ref 65–99)

## 2016-10-12 LAB — BASIC METABOLIC PANEL
Anion gap: 9 (ref 5–15)
BUN: 8 mg/dL (ref 6–20)
CO2: 23 mmol/L (ref 22–32)
Calcium: 8.1 mg/dL — ABNORMAL LOW (ref 8.9–10.3)
Chloride: 105 mmol/L (ref 101–111)
Creatinine, Ser: 0.75 mg/dL (ref 0.44–1.00)
GFR calc Af Amer: >60 mL/min (ref >60)
GFR calc non Af Amer: >60 mL/min (ref >60)
Glucose, Bld: 145 mg/dL — ABNORMAL HIGH (ref 65–99)
Potassium: 4 mmol/L (ref 3.5–5.1)
Sodium: 137 mmol/L (ref 135–145)

## 2016-10-12 LAB — PHOSPHORUS
Phosphorus: 3.3 mg/dL (ref 2.5–4.6)
Phosphorus: 3.2 mg/dL (ref 2.5–4.6)
Phosphorus: 3.3 mg/dL (ref 2.5–4.6)

## 2016-10-12 LAB — CBC
HCT: 34.5 % — ABNORMAL LOW (ref 36.0–46.0)
Hemoglobin: 10.6 g/dL — ABNORMAL LOW (ref 12.0–15.0)
MCH: 22.4 pg — ABNORMAL LOW (ref 26.0–34.0)
MCHC: 30.7 g/dL (ref 30.0–36.0)
MCV: 72.9 fL — ABNORMAL LOW (ref 78.0–100.0)
Platelets: 522 10*3/uL — ABNORMAL HIGH (ref 150–400)
RBC: 4.73 MIL/uL (ref 3.87–5.11)
RDW: 14.4 % (ref 11.5–15.5)
WBC: 16.6 10*3/uL — ABNORMAL HIGH (ref 4.0–10.5)

## 2016-10-12 LAB — MAGNESIUM
Magnesium: 1.9 mg/dL (ref 1.7–2.4)
Magnesium: 2.2 mg/dL (ref 1.7–2.4)
Magnesium: 2.4 mg/dL (ref 1.7–2.4)

## 2016-10-12 LAB — STREP PNEUMONIAE URINARY ANTIGEN: Strep Pneumo Urinary Antigen: NEGATIVE

## 2016-10-12 LAB — HIV ANTIBODY (ROUTINE TESTING W REFLEX): HIV Screen 4th Generation wRfx: NONREACTIVE

## 2016-10-12 MED ORDER — LEVALBUTEROL HCL 0.63 MG/3ML IN NEBU
INHALATION_SOLUTION | RESPIRATORY_TRACT | Status: AC
  Administered 2016-10-12: 0.63 mg
  Filled 2016-10-12: qty 3

## 2016-10-12 MED ORDER — VITAL HIGH PROTEIN PO LIQD: 1000.0000 mL | ORAL | Status: DC

## 2016-10-12 MED ORDER — PRO-STAT SUGAR FREE PO LIQD
30.0000 mL | Freq: Four times a day (QID) | ORAL | Status: DC
  Administered 2016-10-12 – 2016-10-13 (×3): 30 mL
  Filled 2016-10-12 (×5): qty 30

## 2016-10-12 MED ORDER — ALBUTEROL SULFATE (2.5 MG/3ML) 0.083% IN NEBU: 2.5000 mg | INHALATION_SOLUTION | RESPIRATORY_TRACT | Status: DC | PRN

## 2016-10-12 MED ORDER — HYDRALAZINE HCL 20 MG/ML IJ SOLN
5.0000 mg | Freq: Once | INTRAMUSCULAR | Status: AC
Start: 2016-10-12 — End: 2016-10-12
  Administered 2016-10-12: 5 mg via INTRAVENOUS
  Filled 2016-10-12: qty 1

## 2016-10-12 MED ORDER — VITAL HIGH PROTEIN PO LIQD
1000.0000 mL | ORAL | Status: DC
  Administered 2016-10-12: 1000 mL

## 2016-10-12 MED ORDER — LEVALBUTEROL HCL 0.63 MG/3ML IN NEBU
0.6300 mg | INHALATION_SOLUTION | Freq: Three times a day (TID) | RESPIRATORY_TRACT | Status: DC
  Filled 2016-10-12: qty 3

## 2016-10-12 MED ORDER — METHYLPREDNISOLONE SODIUM SUCC 125 MG IJ SOLR
80.0000 mg | Freq: Four times a day (QID) | INTRAMUSCULAR | Status: DC
  Administered 2016-10-12 – 2016-10-13 (×4): 80 mg via INTRAVENOUS
  Filled 2016-10-12 (×4): qty 1.28

## 2016-10-12 MED ORDER — PRO-STAT SUGAR FREE PO LIQD
30.0000 mL | Freq: Two times a day (BID) | ORAL | Status: DC
  Filled 2016-10-12: qty 30

## 2016-10-12 MED ORDER — ALBUTEROL SULFATE (2.5 MG/3ML) 0.083% IN NEBU
2.5000 mg | INHALATION_SOLUTION | RESPIRATORY_TRACT | Status: DC
  Administered 2016-10-12 – 2016-10-13 (×10): 2.5 mg via RESPIRATORY_TRACT
  Filled 2016-10-12 (×10): qty 3

## 2016-10-12 MED ORDER — INSULIN ASPART 100 UNIT/ML ~~LOC~~ SOLN
1.0000 [IU] | SUBCUTANEOUS | Status: DC
  Administered 2016-10-12 – 2016-10-14 (×12): 1 [IU] via SUBCUTANEOUS
  Administered 2016-10-14: 2 [IU] via SUBCUTANEOUS
  Administered 2016-10-15 (×3): 1 [IU] via SUBCUTANEOUS
  Administered 2016-10-16: 2 [IU] via SUBCUTANEOUS
  Administered 2016-10-16 – 2016-10-17 (×4): 1 [IU] via SUBCUTANEOUS

## 2016-10-12 NOTE — Progress Notes (Signed)
Initial Nutrition Assessment  DOCUMENTATION CODES:   Obesity unspecified  INTERVENTION:    Vital High Protein at 10 ml/h (240 ml per day)  Pro-stat 30 ml QID  Provides 640 kcal, 81 gm protein, 201 ml free water daily  Total intake with TF and Propofol will be 1076 kcal  NUTRITION DIAGNOSIS:   Inadequate oral intake related to inability to eat as evidenced by NPO status.  GOAL:   Provide needs based on ASPEN/SCCM guidelines  MONITOR:   Vent status, TF tolerance, Labs, I & O's  REASON FOR ASSESSMENT:   Consult Enteral/tube feeding initiation and management  ASSESSMENT:   26 y/o F with poorly controlled asthma and former tobacco abuse who presented 4/17 with SOB + wheezing. She required BiPAP in ED and continued to have increased WOB despite standard therapies including CAT, IV mag and steroids. She required intubation on 4/17.  Discussed patient in ICU rounds and with RN today. Received MD Consult for TF initiation and management. Patient is currently intubated on ventilator support Temp (24hrs), Avg:99 F (37.2 C), Min:98.3 F (36.8 C), Max:99.8 F (37.7 C)  Propofol: 16.5 ml/hr providing 436 kcal Nutrition-Focused physical exam completed. Findings are no fat depletion, no muscle depletion, and no edema.  Labs and medications reviewed.  Diet Order:  Diet NPO time specified  Skin:  Reviewed, no issues  Last BM:  4/16  Height:   Ht Readings from Last 1 Encounters:  10/11/16  (1.448 m)    Weight:   Wt Readings from Last 1 Encounters:  10/11/16 151 lb 14.4 oz (68.9 kg)    Ideal Body Weight:  43.2 kg  BMI:  Body mass index is 32.87 kg/m.  Estimated Nutritional Needs:   Kcal:  760-965  Protein:  86 gm  Fluid:  1.3 L  EDUCATION NEEDS:   No education needs identified at this time  Joaquin Courts, RD, LDN, CNSC Pager [phone] After Hours Pager [phone]

## 2016-10-12 NOTE — Progress Notes (Addendum)
PULMONARY / CRITICAL CARE MEDICINE   Name: Andrea Daniels MRN: [id number] DOB: 01/02/1991    ADMISSION DATE:  10/11/2016 CONSULTATION DATE:  10/11/16  REFERRING MD:  Konrad Dolores  CHIEF COMPLAINT:  SOB  HISTORY OF PRESENT ILLNESS:  26 y/o F with poorly controlled asthma and former tobacco abuse who presented 4/17 with SOB + wheezing. She required BiPAP in ED and continued to have increased WOB despite standard therapies including CAT, IV mag and steroids. She was subsequently intubated and admitted under PCCM. She was recently admitted 05/2016 for CAP and asthma exacerbation. CXR during this admission with RLL CAP. Ceftriaxone + Azithromycin.  INTERVAL HISTORY:  Intubated. Low grade fever this AM at 99.8. No hypotension. Remains tachycardic. On propofol.  Pct <0.10.   VITAL SIGNS: BP 133/80   Pulse (!) 110   Temp 98.7 F (37.1 C) (Oral)   Resp (!) 26   Ht  (1.448 m)   Wt 151 lb 14.4 oz (68.9 kg)   LMP 09/23/2016   SpO2 98%   BMI 32.87 kg/m   HEMODYNAMICS:    VENTILATOR SETTINGS: Vent Mode: PRVC FiO2 (%):  [40 %-100 %] 50 % Set Rate:  [16 bmp] 16 bmp Vt Set:  [400 mL] 400 mL PEEP:  [10 cmH20] 10 cmH20 Plateau Pressure:  [30 cmH20-33 cmH20] 30 cmH20  INTAKE / OUTPUT: I/O last 3 completed shifts: In: 385.8 [I.V.:315.8; Other:70] Out: 765 [Urine:765]   PHYSICAL EXAMINATION: General: Young AA female, +ETT. Appears comfortable. Neuro: Sedated. FC. HEENT: [place]/AT. +ETT. Cardiovascular: Tachy, regular, No M/R/G. Lungs: CTA BL Abdomen: +BS. ND, NT. Musculoskeletal: No deformities, no edema. Skin: Warm, dry.  LABS:  BMET  Recent Labs Lab 10/11/16 1023 10/11/16 1727 10/11/16 2103 10/12/16 0225  NA 139  --  138 137  K 2.9*  --  4.3 4.0  CL 102  --  107 105  CO2 25  --  21* 23  BUN 8  --  <5* 8  CREATININE 0.85 0.88 0.84 0.75  GLUCOSE 128*  --  146* 145*    Electrolytes  Recent Labs Lab 10/11/16 1023 10/11/16 1727 10/11/16 2103 10/12/16 0225  CALCIUM  8.7*  --  8.0* 8.1*  MG  --  1.9  --  1.9  PHOS  --   --   --  3.3    CBC  Recent Labs Lab 10/11/16 1023 10/11/16 1727 10/12/16 0225  WBC 11.0* 12.9* 16.6*  HGB 10.9* 11.4* 10.6*  HCT 34.7* 36.9 34.5*  PLT 543* 559* 522*    Coag's No results for input(s): APTT, INR in the last 168 hours.  Sepsis Markers  Recent Labs Lab 10/11/16 1727 10/11/16 2103  LATICACIDVEN 4.5* 3.5*  PROCALCITON <0.10  --     ABG  Recent Labs Lab 10/11/16 1842 10/11/16 2058  PHART 7.350 7.160*  PCO2ART 36.8 62.5*  PO2ART 112* 335*    Liver Enzymes No results for input(s): AST, ALT, ALKPHOS, BILITOT, ALBUMIN in the last 168 hours.  Cardiac Enzymes  Recent Labs Lab 10/11/16 1808  TROPONINI <0.03    Glucose  Recent Labs Lab 10/11/16 2024 10/11/16 2348 10/12/16 0340  GLUCAP 132* 128* 133*    Imaging Portable Chest Xray  Result Date: 10/12/2016 CLINICAL DATA:  Respiratory failure EXAM: PORTABLE CHEST 1 VIEW COMPARISON:  10/11/2016 FINDINGS: Endotracheal tube and nasogastric catheter are seen in satisfactory position. There is new significant increased right basilar infiltrate likely related to the right lower lobe. The left lung remains clear. No bony  abnormality is noted. IMPRESSION: Significant increase in right basilar infiltrate. Electronically Signed   By: Alcide Clever M.D.   On: 10/12/2016 07:08   Dg Chest Port 1 View  Result Date: 10/11/2016 CLINICAL DATA:  Acute respiratory failure. EXAM: PORTABLE CHEST 1 VIEW COMPARISON:  Earlier same day. FINDINGS: Endotracheal tube has tip 3.7 cm above the carina. Lungs are adequately inflated with persistent hazy opacification over the right base slightly improved. No effusion or pneumothorax. Cardiomediastinal silhouette and remainder of the exam is unchanged. IMPRESSION: Interval improvement in mild opacification over the right base. Endotracheal tube with tip 3.7 cm above the carina. Electronically Signed   By: Elberta Fortis M.D.    On: 10/11/2016 21:40   Dg Chest Port 1 View  Result Date: 10/11/2016 CLINICAL DATA:  26 y/o  F; acute respiratory failure with hypoxemia. EXAM: PORTABLE CHEST 1 VIEW COMPARISON:  10/11/2016 chest radiograph. FINDINGS: Stable normal cardiac silhouette. No acute osseous abnormality is evident. Ill-defined right lower lobe opacity, possibly represent atelectasis or pneumonia. No pleural effusion or pneumothorax. IMPRESSION: Ill-defined right lower lobe opacity, possibly atelectasis or pneumonia. Electronically Signed   By: Mitzi Hansen M.D.   On: 10/11/2016 17:38   Dg Chest Port 1 View  Result Date: 10/11/2016 CLINICAL DATA:  Extreme shortness of breath.  Asthma. EXAM: PORTABLE CHEST 1 VIEW COMPARISON:  09/15/2016 FINDINGS: Chronic small area of obscured right diaphragmatic apex. There is no edema, consolidation, effusion, or pneumothorax. Normal lung volumes. Normal heart size and mediastinal contours. IMPRESSION: Negative portable chest. Electronically Signed   By: Marnee Spring M.D.   On: 10/11/2016 10:21   Dg Abd Portable 1v  Result Date: 10/12/2016 CLINICAL DATA:  Feeding tube placement. EXAM: PORTABLE ABDOMEN - 1 VIEW COMPARISON:  10/11/2016 FINDINGS: Enteric tube tip is in the right upper quadrant consistent with location in the distal stomach. Motion artifact limits examination but suggestion of infiltration in the right lung base. No gaseous distention of bowel in the upper abdomen. IMPRESSION: Enteric tube tip is in the right upper quadrant consistent with location in the distal stomach. Electronically Signed   By: Burman Nieves M.D.   On: 10/12/2016 01:40   Dg Abd Portable 1v  Result Date: 10/11/2016 CLINICAL DATA:  Enteric tube placement. EXAM: PORTABLE ABDOMEN - 1 VIEW COMPARISON:  CT 06/03/2016 FINDINGS: Enteric tube is present with tip over the mid abdomen just left of midline likely over the mid to distal stomach. Bowel gas pattern is nonobstructive. Bones and soft  tissues are within normal. IMPRESSION: Nonobstructive bowel gas pattern. Enteric tube with tip over the left mid abdomen likely in the mid to distal stomach. Electronically Signed   By: Elberta Fortis M.D.   On: 10/11/2016 21:41   STUDIES:  CXR 4/17 > RLL opacity.  CULTURES: Blood 4/17 >  Sputum 4/18 >   ANTIBIOTICS: Ceftriaxone 4/17 >  Azithromycin 4/17 >   SIGNIFICANT EVENTS: 4/17 > admit.  LINES/TUBES: ETT 4/17 >   DISCUSSION: 26 y.o. female with hx of poorly controlled Asthma, admitted 4/17 with CAP and asthma exacerbation.  She failed BiPAP and ultimately required intubation.  ASSESSMENT / PLAN:  PULMONARY A: Acute hypoxic respiratory failure - multifactorial due to CAP + acute asthma exacerbation in this poorly controlled asthmatic. Consider controlled hypoventillation P:   Remain intubated, wean as able.  Continue Abx + Solumedrol  Q8h, may need escalation slight Xopenex TID SBT daily Needs to establish with pulmonary as outpatient for further management. Peep to goal 5  pcxr in am   CARDIOVASCULAR A:  Sinus tach. P:  Lopressor PRN - maybe avoid Ensure pos balance May need cvp  RENAL A:   Hypokalemia - s/p repletion. Resolved P:   BMET in AM + mag Fluids pos balance  GASTROINTESTINAL A:   Hx gastritis. Nutrition. P:   SUP: Pantoprazole. NPO. Consider TF if unable to extubate in next 24-48 hrs.   HEMATOLOGIC A:   ThromboCYTOSIS, likely reactive VTE Prophylaxis. P:  Lovenox. CBC in AM  INFECTIOUS A:   CAP? Pct 0.10 likely to rise P:   Resp culture and viral panel pend Send leg, strep  ENDOCRINE A:   steroids  P:   Add ssi  NEUROLOGIC A:   Acute encephalopathy - due to sedation. P:   Sedation:  Propofol gtt / Fentanyl PRN. RASS goal: 0 to -1. Daily WUA.  Family updated: No family present at bedside this AM. Will reach out.   Interdisciplinary Family Meeting v Palliative Care Meeting:  Due by: 10/17/16.  CC time: 30  min.  Noemi Chapel, D.OCorinda [facility] Pager: [phone]  or [phone] 10/12/2016, 7:11 AM   STAFF NOTE: I, Rory Percy, MD FACP have personally reviewed patient's available data, including medical history, events of note, physical examination and test results as part of my evaluation. I have discussed with resident/NP and other care providers such as pharmacist, RN and RRT. In addition, I personally evaluated patient and elicited key findings of: rass -1, FC, lungs wheezing diffuse, abdo soft, c/w RT LL CAP in setting asthma exacerbation, ABg last reviewed and concerning, need to avoid autopeep but would likely increase rate slight to max 20-22 if needed, but permit hypercapnia to ph, schedule albuteral, consider slight escalation given severity asthma but not too high with infection noted, pcxr In am , no SBT, peep 10 to goal 5, if unable to peep 5, then 50% needed, feed pep up now, plat noted, may need viscosity, history noted of fungal in apt, ABPA is treated steroids, may need IGE levels, await viral panel, I have updated family in room in full, avoid autopeep, pct likley will rise, no changes to abx The patient is critically ill with multiple organ systems failure and requires high complexity decision making for assessment and support, frequent evaluation and titration of therapies, application of advanced monitoring technologies and extensive interpretation of multiple databases.   Critical Care Time devoted to patient care services described in this note is 35 Minutes. This time reflects time of care of this signee: Rory Percy, MD FACP. This critical care time does not reflect procedure time, or teaching time or supervisory time of PA/NP/Med student/Med Resident etc but could involve care discussion time. Rest per NP/medical resident whose note is outlined above and that I agree with   Mcarthur Rossetti. Tyson Alias, MD, FACP Pgr: [phone] [place]  [facility] 10/12/2016 11:15 AM

## 2016-10-12 NOTE — Care Management Note (Signed)
Case Management Note  Patient Details  Name: CAMYLA CAMPOSANO MRN: [id number] Date of Birth: August 14, 1990  Subjective/Objective:                    Action/Plan:   PTA from home.  Pt is now intubated CM spoke with father.  Pt has active insurance and has access to medications via this coverage   Expected Discharge Date:                  Expected Discharge Plan:  Home/Self Care  In-House Referral:     Discharge planning Services  CM Consult  Post Acute Care Choice:    Choice offered to:     DME Arranged:    DME Agency:     HH Arranged:    HH Agency:     Status of Service:  In process, will continue to follow  If discussed at Long Length of Stay Meetings, dates discussed:    Additional Comments:  Cherylann Parr, RN 10/12/2016, 11:36 AM

## 2016-10-13 ENCOUNTER — Inpatient Hospital Stay (HOSPITAL_COMMUNITY): Payer: Self-pay

## 2016-10-13 DIAGNOSIS — J9601 Acute respiratory failure with hypoxia: Secondary | ICD-10-CM | POA: Diagnosis not present

## 2016-10-13 LAB — GLUCOSE, CAPILLARY
Glucose-Capillary: 138 mg/dL — ABNORMAL HIGH (ref 65–99)
Glucose-Capillary: 140 mg/dL — ABNORMAL HIGH (ref 65–99)
Glucose-Capillary: 124 mg/dL — ABNORMAL HIGH (ref 65–99)
Glucose-Capillary: 121 mg/dL — ABNORMAL HIGH (ref 65–99)
Glucose-Capillary: 139 mg/dL — ABNORMAL HIGH (ref 65–99)
Glucose-Capillary: 124 mg/dL — ABNORMAL HIGH (ref 65–99)

## 2016-10-13 LAB — COMPREHENSIVE METABOLIC PANEL
ALT: 15 U/L (ref 14–54)
AST: 22 U/L (ref 15–41)
Albumin: 3.4 g/dL — ABNORMAL LOW (ref 3.5–5.0)
Alkaline Phosphatase: 49 U/L (ref 38–126)
Anion gap: 11 (ref 5–15)
BUN: 17 mg/dL (ref 6–20)
CO2: 24 mmol/L (ref 22–32)
Calcium: 8.4 mg/dL — ABNORMAL LOW (ref 8.9–10.3)
Chloride: 105 mmol/L (ref 101–111)
Creatinine, Ser: 0.84 mg/dL (ref 0.44–1.00)
GFR calc Af Amer: >60 mL/min (ref >60)
GFR calc non Af Amer: >60 mL/min (ref >60)
Glucose, Bld: 152 mg/dL — ABNORMAL HIGH (ref 65–99)
Potassium: 3.6 mmol/L (ref 3.5–5.1)
Sodium: 140 mmol/L (ref 135–145)
Total Bilirubin: 0.5 mg/dL (ref 0.3–1.2)
Total Protein: 7.1 g/dL (ref 6.5–8.1)

## 2016-10-13 LAB — PHOSPHORUS: Phosphorus: 3.8 mg/dL (ref 2.5–4.6)

## 2016-10-13 LAB — CBC
HCT: 35.9 % — ABNORMAL LOW (ref 36.0–46.0)
Hemoglobin: 10.8 g/dL — ABNORMAL LOW (ref 12.0–15.0)
MCH: 22.3 pg — ABNORMAL LOW (ref 26.0–34.0)
MCHC: 30.1 g/dL (ref 30.0–36.0)
MCV: 74.2 fL — ABNORMAL LOW (ref 78.0–100.0)
Platelets: 501 10*3/uL — ABNORMAL HIGH (ref 150–400)
RBC: 4.84 MIL/uL (ref 3.87–5.11)
RDW: 14.4 % (ref 11.5–15.5)
WBC: 15.1 10*3/uL — ABNORMAL HIGH (ref 4.0–10.5)

## 2016-10-13 LAB — LEGIONELLA PNEUMOPHILA SEROGP 1 UR AG: L. pneumophila Serogp 1 Ur Ag: NEGATIVE

## 2016-10-13 LAB — BLOOD GAS, ARTERIAL
Acid-Base Excess: 2.3 mmol/L — ABNORMAL HIGH (ref 0.0–2.0)
Bicarbonate: 26.3 mmol/L (ref 20.0–28.0)
Drawn by: 418751
FIO2: 40
MECHVT: 400 mL
O2 Saturation: 96.6 %
PEEP: 5 cmH2O
Patient temperature: 98.6
RATE: 12 resp/min
pCO2 arterial: 40.2 mmHg (ref 32.0–48.0)
pH, Arterial: 7.43 (ref 7.350–7.450)
pO2, Arterial: 89.4 mmHg (ref 83.0–108.0)

## 2016-10-13 LAB — PROCALCITONIN: Procalcitonin: <0.1 ng/mL

## 2016-10-13 LAB — MAGNESIUM: Magnesium: 2.4 mg/dL (ref 1.7–2.4)

## 2016-10-13 MED ORDER — STARCH (THICKENING) PO POWD: ORAL | Status: DC | PRN

## 2016-10-13 MED ORDER — RESOURCE THICKENUP CLEAR PO POWD
ORAL | Status: DC | PRN
  Filled 2016-10-13 (×2): qty 125

## 2016-10-13 MED ORDER — LABETALOL HCL 5 MG/ML IV SOLN
10.0000 mg | Freq: Once | INTRAVENOUS | Status: AC
  Administered 2016-10-13: 10 mg via INTRAVENOUS
  Filled 2016-10-13: qty 4

## 2016-10-13 MED ORDER — METHYLPREDNISOLONE SODIUM SUCC 125 MG IJ SOLR
80.0000 mg | Freq: Three times a day (TID) | INTRAMUSCULAR | Status: DC
  Administered 2016-10-13 – 2016-10-14 (×3): 80 mg via INTRAVENOUS
  Filled 2016-10-13 (×3): qty 1.28

## 2016-10-13 NOTE — Progress Notes (Signed)
PULMONARY / CRITICAL CARE MEDICINE   Name: Andrea Daniels MRN: [id number] DOB: [birth year]    ADMISSION DATE:  10/11/2016 CONSULTATION DATE:  10/11/16  REFERRING MD:  Konrad Dolores  CHIEF COMPLAINT:  SOB  HISTORY OF PRESENT ILLNESS:  26 y/o F with poorly controlled asthma and former tobacco abuse who presented 4/17 with SOB + wheezing. She required BiPAP in ED and continued to have increased WOB despite standard therapies including CAT, IV mag and steroids. She was subsequently intubated and admitted under PCCM. She was recently admitted 05/2016 for CAP and asthma exacerbation. CXR during this admission with RLL CAP. Ceftriaxone + Azithromycin.  INTERVAL HISTORY:  Intubated. Afebrile.  Pct remains low <0.10. Rhinovirus+  VITAL SIGNS: BP (!) 156/107 (BP Location: Left Arm)   Pulse 92   Temp 99 F (37.2 C) (Oral)   Resp 19   Ht  (1.448 m)   Wt 160 lb 11.5 oz (72.9 kg)   LMP 09/23/2016   SpO2 100%   BMI 34.78 kg/m   HEMODYNAMICS:    VENTILATOR SETTINGS: Vent Mode: PRVC FiO2 (%):  [40 %] 40 % Set Rate:  [12 bmp] 12 bmp Vt Set:  [400 mL] 400 mL PEEP:  [5 cmH20] 5 cmH20 Plateau Pressure:  [17 cmH20-19 cmH20] 17 cmH20  INTAKE / OUTPUT: I/O last 3 completed shifts: In: 1358.6 [I.V.:662.9; Other:320; NG/GT:25.7; IV Piggyback:350] Out: 1755 [Urine:1755]   PHYSICAL EXAMINATION: General: Young AA female,  Appears comfortable. Neuro: Marvin, at, opens eyes and follows commands.  HEENT: [place]/AT. Cardiovascular: no m/r/g Lungs: CTA BL, good air movement. Abdomen: +BS. ND, NT. Musculoskeletal: No deformities, no edema. Skin: Warm, dry.  LABS:  BMET  Recent Labs Lab 10/11/16 2103 10/12/16 0225 10/13/16 0413  NA 138 137 140  K 4.3 4.0 3.6  CL 107 105 105  CO2 21* 23 24  BUN <5* 8 17  CREATININE 0.84 0.75 0.84  GLUCOSE 146* 145* 152*    Electrolytes  Recent Labs Lab 10/11/16 2103  10/12/16 0225 10/12/16 1125 10/12/16 1821 10/13/16 0413  CALCIUM 8.0*  --  8.1*   --   --  8.4*  MG  --   --  1.9 2.2 2.4 2.4  PHOS  --   < > 3.3 3.2 3.3 3.8  < > = values in this interval not displayed.  CBC  Recent Labs Lab 10/11/16 1727 10/12/16 0225 10/13/16 0413  WBC 12.9* 16.6* 15.1*  HGB 11.4* 10.6* 10.8*  HCT 36.9 34.5* 35.9*  PLT 559* 522* 501*    Coag's No results for input(s): APTT, INR in the last 168 hours.  Sepsis Markers  Recent Labs Lab 10/11/16 1727 10/11/16 2103 10/13/16 0413  LATICACIDVEN 4.5* 3.5*  --   PROCALCITON <0.10  --  <0.10    ABG  Recent Labs Lab 10/11/16 2058 10/12/16 1127 10/13/16 0342  PHART 7.160* 7.413 7.430  PCO2ART 62.5* 38.9 40.2  PO2ART 335* 86.0 89.4    Liver Enzymes  Recent Labs Lab 10/13/16 0413  AST 22  ALT 15  ALKPHOS 49  BILITOT 0.5  ALBUMIN 3.4*    Cardiac Enzymes  Recent Labs Lab 10/11/16 1808  TROPONINI <0.03    Glucose  Recent Labs Lab 10/12/16 0727 10/12/16 1156 10/12/16 1604 10/12/16 1919 10/12/16 2341 10/13/16 0331  GLUCAP 125* 128* 137* 139* 142* 138*    Imaging Dg Chest Port 1 View  Result Date: 10/13/2016 CLINICAL DATA:  Intubation. EXAM: PORTABLE CHEST 1 VIEW COMPARISON:  10/12/2016. FINDINGS: Endotracheal tube and  NG tube in stable position. Endotracheal tube tip 3 cm above the lower portion of the carina. Low lung volumes with basilar atelectasis. Interim improvement of right base infiltrate. No pleural effusion or pneumothorax. IMPRESSION: 1.  Lines and tubes in stable position. 2. Low lung volumes with bibasilar atelectasis. Interim partial clearing of right base infiltrate . Electronically Signed   By: Maisie Fus  Register   On: 10/13/2016 06:51   STUDIES:  CXR 4/17 > RLL opacity.  CULTURES: Blood 4/17 >  Sputum 4/18 >   ANTIBIOTICS: Ceftriaxone 4/17 >  Azithromycin 4/17 >   SIGNIFICANT EVENTS: 4/17 > admit. 4/18 - remains intubated  LINES/TUBES: ETT 4/17 >   DISCUSSION: 26 y.o. female with hx of poorly controlled Asthma, admitted 4/17 with  CAP and asthma exacerbation.  She failed BiPAP and ultimately required intubation.  ASSESSMENT / PLAN:  PULMONARY A: Acute hypoxic respiratory failure -2/2 to viral infection and acute asthma exacerbation in this poorly controlled asthmatic. ?mold exposure  P:   Remain intubated, wean as able.  Solumedrol  Q6h Xopenex TID SBT daily, try weaning today, Avoid autoPEEP, allow permissive hypercapnia  CARDIOVASCULAR A:  Sinus tach - improved P:  Ensure pos balance  RENAL A:   Hypokalemia - s/p repletion. Resolved P:   f/up BMET Fluids pos balance  GASTROINTESTINAL A:   Hx gastritis. Nutrition. P:   SUP: Pantoprazole. NPO. Consider TF if unable to extubate in next 24-48 hrs.   HEMATOLOGIC A:   Thrombocytosis, likely reactive, improving.  Chronic microcytic anemia - stable VTE Prophylaxis. P:  Lovenox. f/up CBC. Consider anemia studies later when not acutely ill.  INFECTIOUS A:   +Rhinovirus viral infection, with asthma exacerbation Pct low,  P:   D/c abx today. Supportive care   ENDOCRINE A:   steroids  P:   ssi  NEUROLOGIC A:   Lightly sedated.  P:   Sedation:  Propofol gtt / Fentanyl PRN. RASS goal: 0 to -1. Daily WUA.   Tasrif Ahmed  STAFF NOTE: I, Rory Percy, MD FACP have personally reviewed patient's available data, including medical history, events of note, physical examination and test results as part of my evaluation. I have discussed with resident/NP and other care providers such as pharmacist, RN and RRT. In addition, I personally evaluated patient and elicited key findings of: alert, O x 4, lungs improved wheezing, pcxr radid improvement rt with some int changes, all c/w atx improved and viral PNA likley , PCt was negative, we thought would rise as rt base was predominant but now resolved indicating atx and PCT again neg x 2 = viral pneumonitis, dc azithro, cosnider dc all abx, weaning this am cpap 5 ps 5, goal 1 hour, assess rsbi,  full WUA, reduce steroids slight, dc TF , I updated mom and dad The patient is critically ill with multiple organ systems failure and requires high complexity decision making for assessment and support, frequent evaluation and titration of therapies, application of advanced monitoring technologies and extensive interpretation of multiple databases.   Critical Care Time devoted to patient care services described in this note is 30 Minutes. This time reflects time of care of this signee: Rory Percy, MD FACP. This critical care time does not reflect procedure time, or teaching time or supervisory time of PA/NP/Med student/Med Resident etc but could involve care discussion time. Rest per NP/medical resident whose note is outlined above and that I agree with   Mcarthur Rossetti. Tyson Alias, MD, FACP Pgr: [phone] [facility] 10/13/2016 11:00 AM

## 2016-10-13 NOTE — Evaluation (Signed)
Clinical/Bedside Swallow Evaluation Patient Details  Name: Andrea Daniels MRN: [id number] Date of Birth: [birth year]  Today's Date: 10/13/2016 Time: SLP Start Time (ACUTE ONLY): 1345 SLP Stop Time (ACUTE ONLY): 1413 SLP Time Calculation (min) (ACUTE ONLY): 28 min  Past Medical History:  Past Medical History:  Diagnosis Date  . Asthma   . Gastritis   . Gastritis   . Ovarian cyst   . UTI (lower urinary tract infection)    Past Surgical History:  Past Surgical History:  Procedure Laterality Date  . NO PAST SURGERIES     HPI:  26 y.o.femalewith hx of poorly controlled Asthma, admitted 4/17 with CAP and asthma exacerbation. She failed BiPAP and ultimately required intubation. Extubated 4/19.    Assessment / Plan / Recommendation Clinical Impression  Patient presents with evidence of a mild acute reversible dysphagia s/p 2 day intubation characterized by suspected sensory impairments and decreased glottal closure resulting in aspiration of thin liquids. Trials of nectar thick liquid, pureed and regular texture solids consumed without overt indication of aspiration and with independent carryover of safe swallowing strategies taught by SLP. Recommend initiation of conservative diet. Prognosis good for rapid advancement of solids at bedside. Will f/u.  SLP Visit Diagnosis: Dysphagia, unspecified (R13.10)    Aspiration Risk  Moderate aspiration risk    Diet Recommendation Dysphagia 3 (Mech soft);Nectar-thick liquid   Liquid Administration via: Cup;No straw Medication Administration: Whole meds with puree Supervision: Patient able to self feed;Full supervision/cueing for compensatory strategies Compensations: Slow rate;Small sips/bites Postural Changes: Seated upright at 90 degrees    Other  Recommendations Oral Care Recommendations: Oral care BID Other Recommendations: Order thickener from pharmacy;Prohibited food (jello, ice cream, thin soups);Remove water pitcher   Follow up  Recommendations None      Frequency and Duration min 2x/week  1 week       Prognosis Prognosis for Safe Diet Advancement: Good      Swallow Study   General HPI: 26 y.o.femalewith hx of poorly controlled Asthma, admitted 4/17 with CAP and asthma exacerbation. She failed BiPAP and ultimately required intubation. Extubated 4/19.  Type of Study: Bedside Swallow Evaluation Previous Swallow Assessment: none Diet Prior to this Study: NPO Temperature Spikes Noted: No Respiratory Status: Nasal cannula History of Recent Intubation: Yes Length of Intubations (days): 2 days Date extubated: 10/06/16 Behavior/Cognition: Alert;Cooperative;Pleasant mood Oral Cavity Assessment: Within Functional Limits Oral Care Completed by SLP: Recent completion by staff Oral Cavity - Dentition: Adequate natural dentition Vision: Functional for self-feeding Self-Feeding Abilities: Able to feed self Patient Positioning: Upright in bed Baseline Vocal Quality: Hoarse Volitional Cough: Strong Volitional Swallow: Able to elicit    Oral/Motor/Sensory Function Overall Oral Motor/Sensory Function: Within functional limits   Ice Chips Ice chips: Within functional limits Presentation: Spoon   Thin Liquid Thin Liquid: Impaired Presentation: Cup;Self Fed Pharyngeal  Phase Impairments: Suspected delayed Swallow;Cough - Immediate    Nectar Thick Nectar Thick Liquid: Within functional limits Presentation: Cup;Self Fed   Honey Thick Honey Thick Liquid: Not tested   Puree Puree: Within functional limits Presentation: Self Fed;Spoon   Solid   GO  Tomoko Sandra MA, CCC-SLP [phone]  Solid: Within functional limits Presentation: Self Fed        Aalijah Lanphere Meryl 10/13/2016,2:42 PM

## 2016-10-13 NOTE — Progress Notes (Signed)
SLP Cancellation Note  Patient Details Name: Andrea Daniels MRN: [id number] DOB: 26-Feb-1991   Cancelled treatment:       Reason Eval/Treat Not Completed: Other (comment). Patient just extubated. Per RN, aphonic at this time indicative of decreased glottal closure which may impact swallowing function. Please page this SLP or [name] at [phone] if vocal quality begins to improve this pm. Otherwise, SLP will f/u in am 4/20.   Ferdinand Lango MA, CCC-SLP [phone]    Ferdinand Lango Meryl 10/13/2016, 11:39 AM

## 2016-10-13 NOTE — Procedures (Signed)
Extubation Procedure Note  Patient Details:   Name: TOSHIE DEMELO DOB: 04-Mar-1991 MRN: [id number]   Airway Documentation:     Evaluation  O2 sats: stable throughout Complications: No apparent complications Patient did tolerate procedure well. Bilateral Breath Sounds: Clear, Diminished   Yes patient able to vocalize. Patient not able to breath around deflated cuff, but ok to extubate per Dr. Tyson Alias.   [place] M Fumie Fiallo 10/13/2016, 11:20

## 2016-10-14 DIAGNOSIS — J9601 Acute respiratory failure with hypoxia: Secondary | ICD-10-CM | POA: Diagnosis not present

## 2016-10-14 LAB — BASIC METABOLIC PANEL
Anion gap: 14 (ref 5–15)
BUN: 24 mg/dL — ABNORMAL HIGH (ref 6–20)
CO2: 25 mmol/L (ref 22–32)
Calcium: 8.5 mg/dL — ABNORMAL LOW (ref 8.9–10.3)
Chloride: 100 mmol/L — ABNORMAL LOW (ref 101–111)
Creatinine, Ser: 0.78 mg/dL (ref 0.44–1.00)
GFR calc Af Amer: >60 mL/min (ref >60)
GFR calc non Af Amer: >60 mL/min (ref >60)
Glucose, Bld: 126 mg/dL — ABNORMAL HIGH (ref 65–99)
Potassium: 3.6 mmol/L (ref 3.5–5.1)
Sodium: 139 mmol/L (ref 135–145)

## 2016-10-14 LAB — CULTURE, RESPIRATORY

## 2016-10-14 LAB — GLUCOSE, CAPILLARY
Glucose-Capillary: 134 mg/dL — ABNORMAL HIGH (ref 65–99)
Glucose-Capillary: 118 mg/dL — ABNORMAL HIGH (ref 65–99)
Glucose-Capillary: 159 mg/dL — ABNORMAL HIGH (ref 65–99)
Glucose-Capillary: 138 mg/dL — ABNORMAL HIGH (ref 65–99)
Glucose-Capillary: 103 mg/dL — ABNORMAL HIGH (ref 65–99)

## 2016-10-14 LAB — CULTURE, RESPIRATORY W GRAM STAIN

## 2016-10-14 LAB — PROCALCITONIN: Procalcitonin: <0.1 ng/mL

## 2016-10-14 MED ORDER — PNEUMOCOCCAL VAC POLYVALENT 25 MCG/0.5ML IJ INJ: 0.5000 mL | INJECTION | INTRAMUSCULAR | Status: DC | PRN

## 2016-10-14 MED ORDER — ALBUTEROL SULFATE (2.5 MG/3ML) 0.083% IN NEBU
2.5000 mg | INHALATION_SOLUTION | Freq: Three times a day (TID) | RESPIRATORY_TRACT | Status: DC
  Administered 2016-10-14 – 2016-10-17 (×10): 2.5 mg via RESPIRATORY_TRACT
  Filled 2016-10-14 (×10): qty 3

## 2016-10-14 MED ORDER — CEFAZOLIN SODIUM-DEXTROSE 2-4 GM/100ML-% IV SOLN
2.0000 g | Freq: Three times a day (TID) | INTRAVENOUS | Status: DC
  Administered 2016-10-14 – 2016-10-16 (×7): 2 g via INTRAVENOUS
  Filled 2016-10-14 (×9): qty 100

## 2016-10-14 MED ORDER — METHYLPREDNISOLONE SODIUM SUCC 40 MG IJ SOLR
40.0000 mg | Freq: Three times a day (TID) | INTRAMUSCULAR | Status: DC
  Administered 2016-10-14 – 2016-10-16 (×6): 40 mg via INTRAVENOUS
  Filled 2016-10-14 (×6): qty 1

## 2016-10-14 MED ORDER — ALBUTEROL SULFATE (2.5 MG/3ML) 0.083% IN NEBU
2.5000 mg | INHALATION_SOLUTION | RESPIRATORY_TRACT | Status: DC | PRN
Start: 2016-10-14 — End: 2016-10-17

## 2016-10-14 MED ORDER — BUDESONIDE 0.5 MG/2ML IN SUSP
0.5000 mg | Freq: Two times a day (BID) | RESPIRATORY_TRACT | Status: DC
  Administered 2016-10-14 – 2016-10-16 (×4): 0.5 mg via RESPIRATORY_TRACT
  Filled 2016-10-14 (×5): qty 2

## 2016-10-14 NOTE — Care Management Note (Signed)
Case Management Note  Patient Details  Name: Andrea Daniels MRN: [id number] Date of Birth: 03-28-1991  Subjective/Objective:            Spoke to patient at the bedside, transferred from ICU today. She states that she does not have insurance coverage, it expired when she aged out of her dad's coverage. She states she has about 27 uses left on her albuterol inhaler, this is the only medication she has to manage her asthma. She was intubated this admission and is poorly controlled at home. Will need MATCH letter at DC. Spoke to patient about follow up at [facility] and the importance of applying for [facility] card and establishing insurance due to chronic disease.  She states the last time she saw her old PCP Oliver Barre was about 3-4 months ago.         Action/Plan:  Watch for nebulizer needs.  Will need MATCH.  Expected Discharge Date:                  Expected Discharge Plan:  Home/Self Care  In-House Referral:     Discharge planning Services  CM Consult  Post Acute Care Choice:    Choice offered to:     DME Arranged:    DME Agency:     HH Arranged:    HH Agency:     Status of Service:  In process, will continue to follow  If discussed at Long Length of Stay Meetings, dates discussed:    Additional Comments:  Lawerance Sabal, RN 10/14/2016, 1:53 PM

## 2016-10-14 NOTE — Evaluation (Signed)
Physical Therapy Evaluation Patient Details Name: Andrea Daniels MRN: [id number] DOB: [birth year] Today's Date: 10/14/2016   History of Present Illness  26 yo with history of asthma admitte with respiratory failure and CAP. Pt with intubation 4/17-4/19  Clinical Impression  Pt very pleasant and able to state some events since admission but relatively amnesic to intubation. Pt normally very independent, works at [employer] at girlfriend does all the driving. Pt with decreased activity tolerance stating she just feels a little off requiring RW for gait today. Pt with below impairments (PT problem list) who will benefit from acute therapy to maximize mobility and gait to return to independent function. Recommend daily ambulation with nursing staff.   sats 97% on 3L, HR 85    Follow Up Recommendations No PT follow up    Equipment Recommendations  None recommended by PT    Recommendations for Other Services       Precautions / Restrictions Precautions Precautions: None      Mobility  Bed Mobility               General bed mobility comments: in WC on arrival  Transfers Overall transfer level: Needs assistance   Transfers: Sit to/from Stand Sit to Stand: Supervision         General transfer comment: cues for hand placement  Ambulation/Gait Ambulation/Gait assistance: Modified independent (Device/Increase time) Ambulation Distance (Feet): 300 Feet Assistive device: Rolling walker (2 wheeled) Gait Pattern/deviations: Step-through pattern;Decreased stride length   Gait velocity interpretation: Below normal speed for age/gender General Gait Details: pt with slow cautious gait on 4L with sats maintained at 97%  Stairs            Wheelchair Mobility    Modified Rankin (Stroke Patients Only)       Balance Overall balance assessment: No apparent balance deficits (not formally assessed)                                           Pertinent  Vitals/Pain Pain Assessment: No/denies pain    Home Living Family/patient expects to be discharged to:: Private residence Living Arrangements: Spouse/significant other Available Help at Discharge: Friend(s);Available PRN/intermittently Type of Home: Apartment Home Access: Level entry     Home Layout: One level Home Equipment: None      Prior Function Level of Independence: Independent               Hand Dominance        Extremity/Trunk Assessment   Upper Extremity Assessment Upper Extremity Assessment: Overall WFL for tasks assessed    Lower Extremity Assessment Lower Extremity Assessment: Overall WFL for tasks assessed    Cervical / Trunk Assessment Cervical / Trunk Assessment: Normal  Communication   Communication: No difficulties  Cognition Arousal/Alertness: Awake/alert Behavior During Therapy: WFL for tasks assessed/performed Overall Cognitive Status: Within Functional Limits for tasks assessed                                        General Comments      Exercises     Assessment/Plan    PT Assessment Patient needs continued PT services  PT Problem List Decreased activity tolerance;Decreased knowledge of use of DME;Cardiopulmonary status limiting activity       PT Treatment Interventions Gait training;Patient/family education;Functional  mobility training;Therapeutic activities    PT Goals (Current goals can be found in the Care Plan section)  Acute Rehab PT Goals Patient Stated Goal: return to work at [employer] PT Goal Formulation: With patient Time For Goal Achievement: 10/21/16 Potential to Achieve Goals: Good    Frequency Min 3X/week   Barriers to discharge Decreased caregiver support      Co-evaluation               End of Session Equipment Utilized During Treatment: Oxygen Activity Tolerance: Patient tolerated treatment well Patient left: in chair;with call bell/phone within reach;with nursing/sitter in  room Nurse Communication: Mobility status PT Visit Diagnosis: Difficulty in walking, not elsewhere classified (R26.2)    Time: 1610-9604 PT Time Calculation (min) (ACUTE ONLY): 23 min   Charges:   PT Evaluation $PT Eval Low Complexity: 1 Procedure PT Treatments $Gait Training: 8-22 mins   PT G Codes:        Delaney Meigs, PT [phone]  Krystina Strieter B Treina Arscott 10/14/2016, 1:20 PM

## 2016-10-14 NOTE — Progress Notes (Signed)
  Speech Language Pathology Treatment: Dysphagia  Patient Details Name: Andrea Daniels MRN: [id number] DOB: 07/18/1990 Today's Date: 10/14/2016 Time: 4935-5217 SLP Time Calculation (min) (ACUTE ONLY): 12 min  Assessment / Plan / Recommendation Clinical Impression  F/u after yesterday's clinical swallow evaluation.  Pt with improved phonation today; improved airway protection with consumption of thin liquids.  No deficits noted while eating regular solids, mixed consistencies with solids/thin liquids, or when drinking three oz water without ceasing.  Post-extubation dysphagia resolved.  Advance diet to regular solids, thin liquids.  No SLP f/u needed - our services will sign off.    HPI HPI: 26 y.o.femalewith hx of poorly controlled Asthma, admitted 4/17 with CAP and asthma exacerbation. She failed BiPAP and ultimately required intubation. Extubated 4/19.       SLP Plan  All goals met       Recommendations  Diet recommendations: Regular;Thin liquid Liquids provided via: Cup;Straw Medication Administration: Whole meds with liquid Supervision: Patient able to self feed                Oral Care Recommendations: Oral care BID SLP Visit Diagnosis: Dysphagia, unspecified (R13.10) Plan: All goals met       GO                Andrea Daniels 10/14/2016, 3:04 PM Brinleigh Tew L. Tivis Ringer, [place] CCC/SLP Pager [phone]

## 2016-10-14 NOTE — Progress Notes (Addendum)
PULMONARY / CRITICAL CARE MEDICINE   Name: Andrea Daniels MRN: [id number] DOB: 12-25-1990    ADMISSION DATE:  10/11/2016 CONSULTATION DATE:  10/11/16  REFERRING MD:  Konrad Dolores  CHIEF COMPLAINT:  SOB  HISTORY OF PRESENT ILLNESS:  26 y/o F with poorly controlled asthma and former tobacco abuse who presented 4/17 with SOB + wheezing. She required BiPAP in ED and continued to have increased WOB despite standard therapies including CAT, IV mag and steroids. She was subsequently intubated and admitted under PCCM. She was recently admitted 05/2016 for CAP and asthma exacerbation. CXR during this admission with RLL CAP. Ceftriaxone + Azithromycin.  INTERVAL HISTORY:  This morning, she has no complaints. Feeling better.  VITAL SIGNS: BP 123/77   Pulse 67   Temp 98.1 F (36.7 C) (Oral)   Resp 15   Ht  (1.448 m)   Wt 160 lb 11.5 oz (72.9 kg)   LMP 09/23/2016   SpO2 100%   BMI 34.78 kg/m   HEMODYNAMICS:    VENTILATOR SETTINGS: Vent Mode: PSV;CPAP FiO2 (%):  [40 %] 40 % PEEP:  [5 cmH20] 5 cmH20 Pressure Support:  [5 cmH20] 5 cmH20  INTAKE / OUTPUT: I/O last 3 completed shifts: In: 752.5 [I.V.:212.5; Other:240; IV Piggyback:300] Out: 665 [Urine:665]   PHYSICAL EXAMINATION: Physical Exam  Constitutional: She is oriented to person, place, and time. No distress.  HENT:  Head: Normocephalic and atraumatic.  Eyes: Conjunctivae are normal. No scleral icterus.  Cardiovascular: Normal rate and regular rhythm.   Pulmonary/Chest: Effort normal. No respiratory distress.  Mild wheezing  Abdominal: Soft. She exhibits no distension.  Neurological: She is alert and oriented to person, place, and time.  Skin: She is not diaphoretic.     LABS:  BMET  Recent Labs Lab 10/12/16 0225 10/13/16 0413 10/14/16 0323  NA 137 140 139  K 4.0 3.6 3.6  CL 105 105 100*  CO2 BUN 8 17 24*  CREATININE 0.75 0.84 0.78  GLUCOSE 145* 152* 126*    Electrolytes  Recent Labs Lab  10/12/16 0225 10/12/16 1125 10/12/16 1821 10/13/16 0413 10/14/16 0323  CALCIUM 8.1*  --   --  8.4* 8.5*  MG 1.9 2.2 2.4 2.4  --   PHOS 3.3 3.2 3.3 3.8  --     CBC  Recent Labs Lab 10/11/16 1727 10/12/16 0225 10/13/16 0413  WBC 12.9* 16.6* 15.1*  HGB 11.4* 10.6* 10.8*  HCT 36.9 34.5* 35.9*  PLT 559* 522* 501*    Coag's No results for input(s): APTT, INR in the last 168 hours.  Sepsis Markers  Recent Labs Lab 10/11/16 1727 10/11/16 2103 10/13/16 0413 10/14/16 0323  LATICACIDVEN 4.5* 3.5*  --   --   PROCALCITON <0.10  --  <0.10 <0.10    ABG  Recent Labs Lab 10/11/16 2058 10/12/16 1127 10/13/16 0342  PHART 7.160* 7.413 7.430  PCO2ART 62.5* 38.9 40.2  PO2ART 335* 86.0 89.4    Liver Enzymes  Recent Labs Lab 10/13/16 0413  AST 22  ALT 15  ALKPHOS 49  BILITOT 0.5  ALBUMIN 3.4*    Cardiac Enzymes  Recent Labs Lab 10/11/16 1808  TROPONINI <0.03    Glucose  Recent Labs Lab 10/13/16 0820 10/13/16 1154 10/13/16 1542 10/13/16 1953 10/13/16 2303 10/14/16 0324  GLUCAP 140* 124* 121* 139* 124* 134*    Imaging No results found.  STUDIES:  CXR 4/17 > RLL opacity.  CULTURES: RVP 4/17 > Rhinovirus + Blood 4/17 >  Sputum 4/18 >    ANTIBIOTICS: Ceftriaxone 4/17 > 4/19 Azithromycin 4/17 > 4/19  SIGNIFICANT EVENTS: 4/17 > admit. 4/18 - remains intubated  LINES/TUBES: ETT 4/17 >   DISCUSSION: 26 y.o. female with hx of poorly controlled Asthma, admitted 4/17 with CAP and asthma exacerbation.  She failed BiPAP and ultimately required intubation but extubated x 24 hours.  ASSESSMENT / PLAN:  PULMONARY A: Acute hypoxic respiratory failure -2/2 to viral infection and acute asthma exacerbation in this poorly controlled asthmatic. ?mold exposure  P:   Remain intubated, wean as able.  Solumedrol  Q6h Xopenex TID SBT daily, try weaning today, Avoid autoPEEP, allow permissive hypercapnia  CARDIOVASCULAR A:  Sinus tach: HR  mostly 60s-90s. P:  Continue telemetry and hydration  RENAL A:   No acute issues.  P:   f/up BMET Fluids pos balance  GASTROINTESTINAL A:   Hx gastritis. Nutrition. P:   SUP: Pantoprazole. NPO. Consider TF if unable to extubate in next 24-48 hrs.   HEMATOLOGIC A:   Thrombocytosis, likely reactive, improving.  Chronic microcytic anemia: Hb stable at 10. VTE Prophylaxis. P:  Lovenox. f/up CBC. Consider anemia studies later when not acutely ill.  INFECTIOUS A:   +Rhinovirus viral infection, with asthma exacerbation P:   Continue asthma management as outlined above  ENDOCRINE A:   steroids  P:   ssi  NEUROLOGIC A:   Lightly sedated.  P:   Sedation:  Propofol gtt / Fentanyl PRN. RASS goal: 0 to -1. Daily WUA.  Heywood Iles, PGY3 Internal Medicine Pager: [phone]  STAFF NOTE: Cindi Carbon, MD FACP have personally reviewed patient's available data, including medical history, events of note, physical examination and test results as part of my evaluation. I have discussed with resident/NP and other care providers such as pharmacist, RN and RRT. In addition, I personally evaluated patient and elicited key findings of: awake, alert, no distress, resolved to mild wheezing, steroids IV to 40, add inhaled steroids, maintain Bders, full diet, mobilize, keep pulse ox, no role abx, pct neg x 3, to triad I updated pt in full Mcarthur Rossetti. Tyson Alias, MD, FACP Pgr: [phone] [facility] 10/14/2016 10:55 AM

## 2016-10-15 DIAGNOSIS — J181 Lobar pneumonia, unspecified organism: Secondary | ICD-10-CM | POA: Diagnosis not present

## 2016-10-15 DIAGNOSIS — J9601 Acute respiratory failure with hypoxia: Secondary | ICD-10-CM | POA: Diagnosis not present

## 2016-10-15 DIAGNOSIS — J4541 Moderate persistent asthma with (acute) exacerbation: Secondary | ICD-10-CM | POA: Diagnosis not present

## 2016-10-15 LAB — CBC
HCT: 39 % (ref 36.0–46.0)
Hemoglobin: 11.6 g/dL — ABNORMAL LOW (ref 12.0–15.0)
MCH: 22.1 pg — ABNORMAL LOW (ref 26.0–34.0)
MCHC: 29.7 g/dL — ABNORMAL LOW (ref 30.0–36.0)
MCV: 74.3 fL — ABNORMAL LOW (ref 78.0–100.0)
Platelets: 498 10*3/uL — ABNORMAL HIGH (ref 150–400)
RBC: 5.25 MIL/uL — ABNORMAL HIGH (ref 3.87–5.11)
RDW: 14.4 % (ref 11.5–15.5)
WBC: 12.7 10*3/uL — ABNORMAL HIGH (ref 4.0–10.5)

## 2016-10-15 LAB — BASIC METABOLIC PANEL
Anion gap: 11 (ref 5–15)
BUN: 19 mg/dL (ref 6–20)
CO2: 24 mmol/L (ref 22–32)
Calcium: 8.5 mg/dL — ABNORMAL LOW (ref 8.9–10.3)
Chloride: 102 mmol/L (ref 101–111)
Creatinine, Ser: 0.68 mg/dL (ref 0.44–1.00)
GFR calc Af Amer: >60 mL/min (ref >60)
GFR calc non Af Amer: >60 mL/min (ref >60)
Glucose, Bld: 113 mg/dL — ABNORMAL HIGH (ref 65–99)
Potassium: 4.1 mmol/L (ref 3.5–5.1)
Sodium: 137 mmol/L (ref 135–145)

## 2016-10-15 LAB — GLUCOSE, CAPILLARY
Glucose-Capillary: 111 mg/dL — ABNORMAL HIGH (ref 65–99)
Glucose-Capillary: 119 mg/dL — ABNORMAL HIGH (ref 65–99)
Glucose-Capillary: 129 mg/dL — ABNORMAL HIGH (ref 65–99)
Glucose-Capillary: 133 mg/dL — ABNORMAL HIGH (ref 65–99)
Glucose-Capillary: 142 mg/dL — ABNORMAL HIGH (ref 65–99)
Glucose-Capillary: 143 mg/dL — ABNORMAL HIGH (ref 65–99)

## 2016-10-15 NOTE — Progress Notes (Signed)
PROGRESS NOTE    Andrea Daniels  FAO:[id number] DOB: January 22, 1991 DOA: 10/11/2016 PCP: No PCP Per Patient   Brief Narrative: 26 y/o F with poorly controlled asthma and former tobacco abuse who presented 4/17 with SOB + wheezing. She required BiPAP in ED and continued to have increased WOB despite standard therapies including CAT, IV mag and steroids. She was subsequently intubated and admitted under PCCM. She was recently admitted 05/2016 for CAP and asthma exacerbation. CXR during this admission with RLL CAP.   Transferred to [facility] today, 10/15/2016  Assessment & Plan:   # Acute hypoxic respiratory failure due to acute asthma exacerbation: Likely due to rhinovirus. -Required mechanical ventilation and now extubated. Patient is still has mild shortness of breath and diffuse wheezing. Plan to continue IV Solu-Medrol, bronchodilators and try to wean down oxygen slowly. I will add Pepcid  #Possible right lower lobe pneumonia: Patient is currently on cefazolin from PCCM. Since patient is clinically improving I will not change antibiotics. Likely needs short course of oral antibiotics on discharge. -Patient was treated with 3 days of IV ceftriaxone and azithromycin in ICU.  Continue current supportive care. If able to wean to room air and her shortness of breath improved, may be able to switch to oral prednisone by tomorrow.  Principal Problem:   Acute respiratory failure with hypoxia (HCC) Active Problems:   Asthma with exacerbation   Tachycardia   Hypokalemia   Lobar pneumonia (HCC)   Acute respiratory failure (HCC)  STUDIES:  CXR 4/17 > RLL opacity.  CULTURES: RVP 4/17 > Rhinovirus + Blood 4/17 >  Sputum 4/18 >    ANTIBIOTICS: Ceftriaxone 4/17 > 4/19 Azithromycin 4/17 > 4/19  SIGNIFICANT EVENTS: 4/17 > admit. 4/18 - remains intubated  LINES/TUBES: ETT 4/17 >     DVT prophylaxis: Lovenox subcutaneous Code Status: Full code Family Communication: No family present  however patient's friend at bedside Disposition Plan: Likely discharge home tomorrow if clinically improves  Subjective: Patient was seen and examined at bedside. Still reported chest tightness, wheezing and requiring about 1-2 L of oxygen. No chest pain, nausea or vomiting.   Objective: Vitals:   10/14/16 2226 10/15/16 0600 10/15/16 0601 10/15/16 0931  BP: 128/90 133/89    Pulse: 74 (!) 44 79   Resp: 18 17    Temp: 98.4 F (36.9 C) 97.7 F (36.5 C)    TempSrc:  Oral    SpO2: 100% 98% 100% 98%  Weight:      Height:        Intake/Output Summary (Last 24 hours) at 10/15/16 1237 Last data filed at 10/15/16 6962  Gross per 24 hour  Intake              720 ml  Output              120 ml  Net              600 ml   Filed Weights   10/11/16 1001 10/11/16 2100 10/13/16 0500  Weight: 68.9 kg (152 lb) 68.9 kg (151 lb 14.4 oz) 72.9 kg (160 lb 11.5 oz)    Examination:  General exam: Appears calm and comfortable  Respiratory system: Bilateral diffuse wheezing, no crackle. Respiratory effort normal Cardiovascular system: S1 & S2 heard, RRR.  No pedal edema. Gastrointestinal system: Abdomen is nondistended, soft and nontender. Normal bowel sounds heard. Central nervous system: Alert and oriented. No focal neurological deficits. Extremities: Symmetric 5 x 5 power. Skin: No rashes, lesions  or ulcers Psychiatry: Judgement and insight appear normal. Mood & affect appropriate.     Data Reviewed: I have personally reviewed following labs and imaging studies  CBC:  Recent Labs Lab 10/11/16 1023 10/11/16 1727 10/12/16 0225 10/13/16 0413 10/15/16 0809  WBC 11.0* 12.9* 16.6* 15.1* 12.7*  NEUTROABS 8.9*  --   --   --   --   HGB 10.9* 11.4* 10.6* 10.8* 11.6*  HCT 34.7* 36.9 34.5* 35.9* 39.0  MCV 73.7* 73.5* 72.9* 74.2* 74.3*  PLT 543* 559* 522* 501* 498*   Basic Metabolic Panel:  Recent Labs Lab 10/11/16 1727 10/11/16 2103 10/12/16 0225 10/12/16 1125 10/12/16 1821  10/13/16 0413 10/14/16 0323 10/15/16 0809  NA  --  138 137  --   --  140 139 137  K  --  4.3 4.0  --   --  3.6 3.6 4.1  CL  --  107 105  --   --  105 100* 102  CO2  --  21* 23  --   --  GLUCOSE  --  146* 145*  --   --  152* 126* 113*  BUN  --  <5* 8  --   --  17 24* 19  CREATININE 0.88 0.84 0.75  --   --  0.84 0.78 0.68  CALCIUM  --  8.0* 8.1*  --   --  8.4* 8.5* 8.5*  MG 1.9  --  1.9 2.2 2.4 2.4  --   --   PHOS  --   --  3.3 3.2 3.3 3.8  --   --    GFR: Estimated Creatinine Clearance: 88 mL/min (by C-G formula based on SCr of 0.68 mg/dL). Liver Function Tests:  Recent Labs Lab 10/13/16 0413  AST 22  ALT 15  ALKPHOS 49  BILITOT 0.5  PROT 7.1  ALBUMIN 3.4*   No results for input(s): LIPASE, AMYLASE in the last 168 hours. No results for input(s): AMMONIA in the last 168 hours. Coagulation Profile: No results for input(s): INR, PROTIME in the last 168 hours. Cardiac Enzymes:  Recent Labs Lab 10/11/16 1808  TROPONINI <0.03   BNP (last 3 results) No results for input(s): PROBNP in the last 8760 hours. HbA1C: No results for input(s): HGBA1C in the last 72 hours. CBG:  Recent Labs Lab 10/14/16 2036 10/15/16 0018 10/15/16 0437 10/15/16 0824 10/15/16 1234  GLUCAP 103* 133* 119* 111* 129*   Lipid Profile: No results for input(s): CHOL, HDL, LDLCALC, TRIG, CHOLHDL, LDLDIRECT in the last 72 hours. Thyroid Function Tests: No results for input(s): TSH, T4TOTAL, FREET4, T3FREE, THYROIDAB in the last 72 hours. Anemia Panel: No results for input(s): VITAMINB12, FOLATE, FERRITIN, TIBC, IRON, RETICCTPCT in the last 72 hours. Sepsis Labs:  Recent Labs Lab 10/11/16 1727 10/11/16 2103 10/13/16 0413 10/14/16 0323  PROCALCITON <0.10  --  <0.10 <0.10  LATICACIDVEN 4.5* 3.5*  --   --     Recent Results (from the past 240 hour(s))  Respiratory Panel by PCR     Status: Abnormal   Collection Time: 10/11/16 12:38 AM  Result Value Ref Range Status   Adenovirus  NOT DETECTED NOT DETECTED Final   Coronavirus 229E NOT DETECTED NOT DETECTED Final   Coronavirus HKU1 NOT DETECTED NOT DETECTED Final   Coronavirus NL63 NOT DETECTED NOT DETECTED Final   Coronavirus OC43 NOT DETECTED NOT DETECTED Final   Metapneumovirus NOT DETECTED NOT DETECTED Final   Rhinovirus / Enterovirus DETECTED (A) NOT DETECTED  Final   Influenza A NOT DETECTED NOT DETECTED Final   Influenza B NOT DETECTED NOT DETECTED Final   Parainfluenza Virus 1 NOT DETECTED NOT DETECTED Final   Parainfluenza Virus 2 NOT DETECTED NOT DETECTED Final   Parainfluenza Virus 3 NOT DETECTED NOT DETECTED Final   Parainfluenza Virus 4 NOT DETECTED NOT DETECTED Final   Respiratory Syncytial Virus NOT DETECTED NOT DETECTED Final   Bordetella pertussis NOT DETECTED NOT DETECTED Final   Chlamydophila pneumoniae NOT DETECTED NOT DETECTED Final   Mycoplasma pneumoniae NOT DETECTED NOT DETECTED Final  MRSA PCR Screening     Status: None   Collection Time: 10/11/16  4:53 PM  Result Value Ref Range Status   MRSA by PCR NEGATIVE NEGATIVE Final    Comment:        The GeneXpert MRSA Assay (FDA approved for NASAL specimens only), is one component of a comprehensive MRSA colonization surveillance program. It is not intended to diagnose MRSA infection nor to guide or monitor treatment for MRSA infections.   Culture, blood (routine x 2) Call MD if unable to obtain prior to antibiotics being given     Status: None (Preliminary result)   Collection Time: 10/11/16  6:03 PM  Result Value Ref Range Status   Specimen Description BLOOD RIGHT ANTECUBITAL  Final   Special Requests BOTTLES DRAWN AEROBIC AND ANAEROBIC BCAV  Final   Culture NO GROWTH 4 DAYS  Final   Report Status PENDING  Incomplete  Culture, blood (routine x 2) Call MD if unable to obtain prior to antibiotics being given     Status: None (Preliminary result)   Collection Time: 10/11/16  6:08 PM  Result Value Ref Range Status   Specimen Description  BLOOD LEFT ANTECUBITAL  Final   Special Requests IN PEDIATRIC BOTTLE BCAV  Final   Culture NO GROWTH 4 DAYS  Final   Report Status PENDING  Incomplete  Culture, respiratory (NON-Expectorated)     Status: None   Collection Time: 10/12/16  7:45 AM  Result Value Ref Range Status   Specimen Description TRACHEAL ASPIRATE  Final   Special Requests NONE  Final   Gram Stain   Final    ABUNDANT WBC PRESENT,BOTH PMN AND MONONUCLEAR NO ORGANISMS SEEN    Culture FEW STAPHYLOCOCCUS AUREUS  Final   Report Status 10/14/2016 FINAL  Final   Organism ID, Bacteria STAPHYLOCOCCUS AUREUS  Final      Susceptibility   Staphylococcus aureus - MIC*    CIPROFLOXACIN <=0.5 SENSITIVE Sensitive     ERYTHROMYCIN <=0.25 SENSITIVE Sensitive     GENTAMICIN <=0.5 SENSITIVE Sensitive     OXACILLIN 0.5 SENSITIVE Sensitive     TETRACYCLINE >=16 RESISTANT Resistant     VANCOMYCIN 1 SENSITIVE Sensitive     TRIMETH/SULFA <=10 SENSITIVE Sensitive     CLINDAMYCIN <=0.25 SENSITIVE Sensitive     RIFAMPIN <=0.5 SENSITIVE Sensitive     Inducible Clindamycin NEGATIVE Sensitive     * FEW STAPHYLOCOCCUS AUREUS         Radiology Studies: No results found.      Scheduled Meds: . albuterol  2.5 mg Nebulization TID  . budesonide (PULMICORT) nebulizer solution  0.5 mg Nebulization BID  . enoxaparin (LOVENOX) injection  40 mg Subcutaneous Q24H  . insulin aspart  1-3 Units Subcutaneous Q4H  . methylPREDNISolone (SOLU-MEDROL) injection  40 mg Intravenous Q8H   Continuous Infusions: .  ceFAZolin (ANCEF) IV Stopped (10/15/16 0538)     LOS: 3 days  Domanic Matusek Jaynie Collins, MD [facility] Pager [phone]  If 7PM-7AM, please contact night-coverage [email or web address] Password [facility] 10/15/2016, 12:37 PM

## 2016-10-16 DIAGNOSIS — J9601 Acute respiratory failure with hypoxia: Secondary | ICD-10-CM | POA: Diagnosis not present

## 2016-10-16 DIAGNOSIS — J4552 Severe persistent asthma with status asthmaticus: Secondary | ICD-10-CM | POA: Diagnosis not present

## 2016-10-16 LAB — GLUCOSE, CAPILLARY
Glucose-Capillary: 102 mg/dL — ABNORMAL HIGH (ref 65–99)
Glucose-Capillary: 130 mg/dL — ABNORMAL HIGH (ref 65–99)
Glucose-Capillary: 137 mg/dL — ABNORMAL HIGH (ref 65–99)
Glucose-Capillary: 144 mg/dL — ABNORMAL HIGH (ref 65–99)
Glucose-Capillary: 151 mg/dL — ABNORMAL HIGH (ref 65–99)
Glucose-Capillary: 97 mg/dL (ref 65–99)

## 2016-10-16 LAB — CULTURE, BLOOD (ROUTINE X 2)
Culture: NO GROWTH
Culture: NO GROWTH

## 2016-10-16 MED ORDER — FLUTICASONE FUROATE-VILANTEROL 200-25 MCG/INH IN AEPB
1.0000 | INHALATION_SPRAY | Freq: Every day | RESPIRATORY_TRACT | Status: DC
  Administered 2016-10-17: 08:00:00 1 via RESPIRATORY_TRACT
  Filled 2016-10-16: qty 28

## 2016-10-16 MED ORDER — METHYLPREDNISOLONE SODIUM SUCC 40 MG IJ SOLR
40.0000 mg | Freq: Every day | INTRAMUSCULAR | Status: DC
  Administered 2016-10-17: 40 mg via INTRAVENOUS
  Filled 2016-10-16: qty 1

## 2016-10-16 NOTE — Progress Notes (Signed)
PROGRESS NOTE    Andrea Daniels  ZOX:[id number] DOB: June 08, 1991 DOA: 10/11/2016 PCP: No PCP Per Patient    Brief Narrative:  26 yo female with asthma, presented with worsening dyspnea. Initially presented to [facility] with respiratory distress, refractive to bronchodilator therapy, placed on non invasive mechanical ventilation and transferred to [facility]. Due persistent acute hypoxic respiratory failure patient was placed on invasive mechanical ventilation. Patient treated with systemic steroids and aggressive bronchodilator therapy, successfully extubated on 04/20.     Assessment & Plan:   Principal Problem:   Acute respiratory failure with hypoxia (HCC) Active Problems:   Asthma with exacerbation   Tachycardia   Hypokalemia   Lobar pneumonia (HCC)   Acute respiratory failure (HCC)  1. Acute hypoxic respiratory failure. Clinically patient improved symptoms, no significant wheezing, will continue to taper down systemic steroids, will continue bronchodilator therapy. Patient reports, uncontrolled asthma symptoms at home, severe persistent, will start patient on inhales corticosteroid and long acting B agonist.   2. Right lower lobe pneumonia, ruled out. Chest film personally reviewed, bibasilar atelectasis, improving. Cultures no growth, patient has been afebrile, no leukocytosis. Procalcitonin less 0.10, will dc antibiotic therapy.  Out of bed as tolerated.   3. Steroid induced hyperglycemia. Will continue sliding scale for glucose cover and monitoring, capillary glucose 130, 97, 137. Will continue to decrease dose of steroids.     DVT prophylaxis: enoxaparin  Code Status: full  Family Communication: no family at baseline  Disposition Plan: home  Consultants:   Pulmonary    Procedures:     Antimicrobials:   Ceftriaxone  Azithromycin    Subjective: Dyspnea has been improving, close to baseline, no chest pain, no wheezing, no nausea or vomiting. Patient with uncontrolled asthma  at home, severe persistent.   Objective: Vitals:   10/15/16 2152 10/16/16 0607 10/16/16 0731 10/16/16 1241  BP: 138/86 136/83  118/67  Pulse: 85 71  61  Resp: Temp: 98.3 F (36.8 C) 97.5 F (36.4 C)  98.4 F (36.9 C)  TempSrc: Oral Oral  Oral  SpO2: 95% 98% 99% 96%  Weight:      Height:        Intake/Output Summary (Last 24 hours) at 10/16/16 1251 Last data filed at 10/16/16 0005  Gross per 24 hour  Intake              600 ml  Output                0 ml  Net              600 ml   Filed Weights   10/11/16 1001 10/11/16 2100 10/13/16 0500  Weight: 68.9 kg (152 lb) 68.9 kg (151 lb 14.4 oz) 72.9 kg (160 lb 11.5 oz)    Examination:  General exam: not in pain or dyspnea E ENT: no pallor, icterus, oral mucosa moist.   Respiratory system: Mild decreased breath sounds, no wheezing, rales or rhonchi. Respiratory effort normal. Cardiovascular system: S1 & S2 heard, RRR. No JVD, murmurs, rubs, gallops or clicks. No pedal edema. Gastrointestinal system: Abdomen is nondistended, soft and nontender. No organomegaly or masses felt. Normal bowel sounds heard. Central nervous system: Alert and oriented. No focal neurological deficits. Extremities: Symmetric 5 x 5 power. Skin: No rashes, lesions or ulcers     Data Reviewed: I have personally reviewed following labs and imaging studies  CBC:  Recent Labs Lab 10/11/16 1023 10/11/16 1727 10/12/16 0225 10/13/16 0413  10/15/16 0809  WBC 11.0* 12.9* 16.6* 15.1* 12.7*  NEUTROABS 8.9*  --   --   --   --   HGB 10.9* 11.4* 10.6* 10.8* 11.6*  HCT 34.7* 36.9 34.5* 35.9* 39.0  MCV 73.7* 73.5* 72.9* 74.2* 74.3*  PLT 543* 559* 522* 501* 498*   Basic Metabolic Panel:  Recent Labs Lab 10/11/16 1727 10/11/16 2103 10/12/16 0225 10/12/16 1125 10/12/16 1821 10/13/16 0413 10/14/16 0323 10/15/16 0809  NA  --  138 137  --   --  140 139 137  K  --  4.3 4.0  --   --  3.6 3.6 4.1  CL  --  107 105  --   --  105 100* 102  CO2   --  21* 23  --   --  GLUCOSE  --  146* 145*  --   --  152* 126* 113*  BUN  --  <5* 8  --   --  17 24* 19  CREATININE 0.88 0.84 0.75  --   --  0.84 0.78 0.68  CALCIUM  --  8.0* 8.1*  --   --  8.4* 8.5* 8.5*  MG 1.9  --  1.9 2.2 2.4 2.4  --   --   PHOS  --   --  3.3 3.2 3.3 3.8  --   --    GFR: Estimated Creatinine Clearance: 88 mL/min (by C-G formula based on SCr of 0.68 mg/dL). Liver Function Tests:  Recent Labs Lab 10/13/16 0413  AST 22  ALT 15  ALKPHOS 49  BILITOT 0.5  PROT 7.1  ALBUMIN 3.4*   No results for input(s): LIPASE, AMYLASE in the last 168 hours. No results for input(s): AMMONIA in the last 168 hours. Coagulation Profile: No results for input(s): INR, PROTIME in the last 168 hours. Cardiac Enzymes:  Recent Labs Lab 10/11/16 1808  TROPONINI <0.03   BNP (last 3 results) No results for input(s): PROBNP in the last 8760 hours. HbA1C: No results for input(s): HGBA1C in the last 72 hours. CBG:  Recent Labs Lab 10/15/16 2017 10/16/16 0000 10/16/16 0423 10/16/16 0805 10/16/16 1152  GLUCAP 143* 144* 130* 97 137*   Lipid Profile: No results for input(s): CHOL, HDL, LDLCALC, TRIG, CHOLHDL, LDLDIRECT in the last 72 hours. Thyroid Function Tests: No results for input(s): TSH, T4TOTAL, FREET4, T3FREE, THYROIDAB in the last 72 hours. Anemia Panel: No results for input(s): VITAMINB12, FOLATE, FERRITIN, TIBC, IRON, RETICCTPCT in the last 72 hours. Sepsis Labs:  Recent Labs Lab 10/11/16 1727 10/11/16 2103 10/13/16 0413 10/14/16 0323  PROCALCITON <0.10  --  <0.10 <0.10  LATICACIDVEN 4.5* 3.5*  --   --     Recent Results (from the past 240 hour(s))  Respiratory Panel by PCR     Status: Abnormal   Collection Time: 10/11/16 12:38 AM  Result Value Ref Range Status   Adenovirus NOT DETECTED NOT DETECTED Final   Coronavirus 229E NOT DETECTED NOT DETECTED Final   Coronavirus HKU1 NOT DETECTED NOT DETECTED Final   Coronavirus NL63 NOT DETECTED NOT  DETECTED Final   Coronavirus OC43 NOT DETECTED NOT DETECTED Final   Metapneumovirus NOT DETECTED NOT DETECTED Final   Rhinovirus / Enterovirus DETECTED (A) NOT DETECTED Final   Influenza A NOT DETECTED NOT DETECTED Final   Influenza B NOT DETECTED NOT DETECTED Final   Parainfluenza Virus 1 NOT DETECTED NOT DETECTED Final   Parainfluenza Virus 2 NOT DETECTED NOT DETECTED Final  Parainfluenza Virus 3 NOT DETECTED NOT DETECTED Final   Parainfluenza Virus 4 NOT DETECTED NOT DETECTED Final   Respiratory Syncytial Virus NOT DETECTED NOT DETECTED Final   Bordetella pertussis NOT DETECTED NOT DETECTED Final   Chlamydophila pneumoniae NOT DETECTED NOT DETECTED Final   Mycoplasma pneumoniae NOT DETECTED NOT DETECTED Final  MRSA PCR Screening     Status: None   Collection Time: 10/11/16  4:53 PM  Result Value Ref Range Status   MRSA by PCR NEGATIVE NEGATIVE Final    Comment:        The GeneXpert MRSA Assay (FDA approved for NASAL specimens only), is one component of a comprehensive MRSA colonization surveillance program. It is not intended to diagnose MRSA infection nor to guide or monitor treatment for MRSA infections.   Culture, blood (routine x 2) Call MD if unable to obtain prior to antibiotics being given     Status: None (Preliminary result)   Collection Time: 10/11/16  6:03 PM  Result Value Ref Range Status   Specimen Description BLOOD RIGHT ANTECUBITAL  Final   Special Requests BOTTLES DRAWN AEROBIC AND ANAEROBIC BCAV  Final   Culture NO GROWTH 4 DAYS  Final   Report Status PENDING  Incomplete  Culture, blood (routine x 2) Call MD if unable to obtain prior to antibiotics being given     Status: None (Preliminary result)   Collection Time: 10/11/16  6:08 PM  Result Value Ref Range Status   Specimen Description BLOOD LEFT ANTECUBITAL  Final   Special Requests IN PEDIATRIC BOTTLE BCAV  Final   Culture NO GROWTH 4 DAYS  Final   Report Status PENDING  Incomplete  Culture,  respiratory (NON-Expectorated)     Status: None   Collection Time: 10/12/16  7:45 AM  Result Value Ref Range Status   Specimen Description TRACHEAL ASPIRATE  Final   Special Requests NONE  Final   Gram Stain   Final    ABUNDANT WBC PRESENT,BOTH PMN AND MONONUCLEAR NO ORGANISMS SEEN    Culture FEW STAPHYLOCOCCUS AUREUS  Final   Report Status 10/14/2016 FINAL  Final   Organism ID, Bacteria STAPHYLOCOCCUS AUREUS  Final      Susceptibility   Staphylococcus aureus - MIC*    CIPROFLOXACIN <=0.5 SENSITIVE Sensitive     ERYTHROMYCIN <=0.25 SENSITIVE Sensitive     GENTAMICIN <=0.5 SENSITIVE Sensitive     OXACILLIN 0.5 SENSITIVE Sensitive     TETRACYCLINE >=16 RESISTANT Resistant     VANCOMYCIN 1 SENSITIVE Sensitive     TRIMETH/SULFA <=10 SENSITIVE Sensitive     CLINDAMYCIN <=0.25 SENSITIVE Sensitive     RIFAMPIN <=0.5 SENSITIVE Sensitive     Inducible Clindamycin NEGATIVE Sensitive     * FEW STAPHYLOCOCCUS AUREUS         Radiology Studies: No results found.      Scheduled Meds: . albuterol  2.5 mg Nebulization TID  . budesonide (PULMICORT) nebulizer solution  0.5 mg Nebulization BID  . enoxaparin (LOVENOX) injection  40 mg Subcutaneous Q24H  . insulin aspart  1-3 Units Subcutaneous Q4H  . methylPREDNISolone (SOLU-MEDROL) injection  40 mg Intravenous Q8H   Continuous Infusions: .  ceFAZolin (ANCEF) IV Stopped (10/16/16 1610)     LOS: 4 days     Lenus Trauger Annett Gula, MD [facility] Pager [phone]  If 7PM-7AM, please contact night-coverage [email or web address] Password [facility] 10/16/2016, 12:51 PM

## 2016-10-17 DIAGNOSIS — J9601 Acute respiratory failure with hypoxia: Secondary | ICD-10-CM | POA: Diagnosis not present

## 2016-10-17 LAB — BASIC METABOLIC PANEL
Anion gap: 13 (ref 5–15)
BUN: 14 mg/dL (ref 6–20)
CO2: 25 mmol/L (ref 22–32)
Calcium: 8.4 mg/dL — ABNORMAL LOW (ref 8.9–10.3)
Chloride: 101 mmol/L (ref 101–111)
Creatinine, Ser: 0.8 mg/dL (ref 0.44–1.00)
GFR calc Af Amer: >60 mL/min (ref >60)
GFR calc non Af Amer: >60 mL/min (ref >60)
Glucose, Bld: 113 mg/dL — ABNORMAL HIGH (ref 65–99)
Potassium: 2.9 mmol/L — ABNORMAL LOW (ref 3.5–5.1)
Sodium: 139 mmol/L (ref 135–145)

## 2016-10-17 LAB — CBC WITH DIFFERENTIAL/PLATELET
Basophils Absolute: 0 10*3/uL (ref 0.0–0.1)
Basophils Relative: 0 %
Eosinophils Absolute: 0.1 10*3/uL (ref 0.0–0.7)
Eosinophils Relative: 1 %
HCT: 38.1 % (ref 36.0–46.0)
Hemoglobin: 11.7 g/dL — ABNORMAL LOW (ref 12.0–15.0)
Lymphocytes Relative: 59 %
Lymphs Abs: 7.2 10*3/uL — ABNORMAL HIGH (ref 0.7–4.0)
MCH: 22.7 pg — ABNORMAL LOW (ref 26.0–34.0)
MCHC: 30.7 g/dL (ref 30.0–36.0)
MCV: 73.8 fL — ABNORMAL LOW (ref 78.0–100.0)
Monocytes Absolute: 0.5 10*3/uL (ref 0.1–1.0)
Monocytes Relative: 4 %
Neutro Abs: 4.4 10*3/uL (ref 1.7–7.7)
Neutrophils Relative %: 36 %
Platelets: 465 10*3/uL — ABNORMAL HIGH (ref 150–400)
RBC: 5.16 MIL/uL — ABNORMAL HIGH (ref 3.87–5.11)
RDW: 14.3 % (ref 11.5–15.5)
WBC: 12.2 10*3/uL — ABNORMAL HIGH (ref 4.0–10.5)

## 2016-10-17 LAB — GLUCOSE, CAPILLARY
Glucose-Capillary: 119 mg/dL — ABNORMAL HIGH (ref 65–99)
Glucose-Capillary: 144 mg/dL — ABNORMAL HIGH (ref 65–99)
Glucose-Capillary: 84 mg/dL (ref 65–99)
Glucose-Capillary: 85 mg/dL (ref 65–99)

## 2016-10-17 MED ORDER — BUDESONIDE-FORMOTEROL FUMARATE 80-4.5 MCG/ACT IN AERO: 2.0000 | INHALATION_SPRAY | Freq: Two times a day (BID) | RESPIRATORY_TRACT | 0 refills | Status: DC

## 2016-10-17 MED ORDER — MONTELUKAST SODIUM 10 MG PO TABS: 10.0000 mg | ORAL_TABLET | Freq: Every day | ORAL | 0 refills | Status: DC

## 2016-10-17 MED ORDER — PREDNISONE 10 MG PO TABS: ORAL_TABLET | ORAL | 0 refills | Status: DC

## 2016-10-17 NOTE — Discharge Summary (Signed)
Discharge Summary  AILED DEFIBAUGH ZOX:[id number] DOB: Mar 23, 1991  PCP: No PCP Per Patient  Admit date: 10/11/2016 Discharge date: 10/17/2016  Time spent: <53mins  Recommendations for Outpatient Follow-up:  1. F/u with PMD within a week  for hospital discharge follow up, repeat cbc/bmp at follow up 2. f/u with pulmonology for asthma evaluation and management, patient report persistent symptom last few months  Discharge Diagnoses:  Active Hospital Problems   Diagnosis Date Noted  . Acute respiratory failure with hypoxia (HCC) 10/11/2016  . Acute respiratory failure (HCC) 10/12/2016  . Hypokalemia 10/11/2016  . Lobar pneumonia (HCC)   . Tachycardia 06/02/2016  . Asthma with exacerbation 10/22/2013    Resolved Hospital Problems   Diagnosis Date Noted Date Resolved  No resolved problems to display.    Discharge Condition: stable  Diet recommendation: regular diet  Filed Weights   10/11/16 1001 10/11/16 2100 10/13/16 0500  Weight: 68.9 kg (152 lb) 68.9 kg (151 lb 14.4 oz) 72.9 kg (160 lb 11.5 oz)    History of present illness:  Patient coming from:  [facility]  Chief Complaint: Progressive shortness of breath HPI: Andrea Daniels is a 26 y.o. female with medical history significant of asthma, gastritis, ovarian cyst who presented to the [facility] with complaints of progressive shortness of breath and wheezing. Patient was delivered by EMS with past medical facility in severe respiratory distress. He was given 2 separate nebulizer treatments and Solu-Medrol and still had difficulties to previous.  Patient denied chest pain, shortness of breath, cough productive of sputum. She was admitted for community-acquired pneumonia in December 2017 and during that time also was diagnosed with asthma exacerbation and discharged on albuterol MDI and steroids  ED Course: In the emergency department she was placed on continuous nebulizer, given IV magnesium  sulfate and placed on BiPAP. Her condition improved Marginally, but with every attempt to discontinue BiPAP patient was becoming severely short of breath and was wheezing. Given this inability to be weaned off BiPAP patient was transferred to [facility] as a direct admission On arrival she was tachycardic with heart rate reaching 156 bpm, tachypneic with respirations of 34/m, her pressure was elevated-181/112 mmHg and O2 saturation on supplemental  Oxygen via nasal cannula was 86%  Blood work demonstrated leukocytosis with white blood cells count of 11,000, mild anemia with hemoglobin 10 0.9, thrombocythemia with platelet count of 543 hypokalemia with potassium 2.9  she had continued increased WOB despite BiPAP; therefore, PCCM was called, she is transferred to icu on 4/17 and intubated on 4/17, she improved with treatment and extubated on 4/19, she is transferred to hospitalist service on 4/21   Hospital Course:  Principal Problem:   Acute respiratory failure with hypoxia (HCC) Active Problems:   Asthma with exacerbation   Tachycardia   Hypokalemia   Lobar pneumonia (HCC)   Acute respiratory failure (HCC)   1. Acute hypoxic respiratory failure. Likely due to asthma exacerbation, respiratory viral panel + rhinovirus She required intubation and successfully extubated on 4/19, she is to continue albuterol inhalor, taper steroids,  Patient reports, uncontrolled asthma symptoms at home ) has been wheezing daily for the last few months which is consistent with severe persistent, she is started on inhales corticosteroid and long acting B agonist ( symbicort), she is also started on singulair, she is discharged home with pmd and pulmonology follow up, patient report lost insurance a few months ago, case manager consulted for follow up appointment, meds  assistance. ( match letter and orange card application and follow up with [facility])  2. Right lower lobe pneumonia, ruled out.  cxr with  bibasilar atelectasis, improving. Blood culture no growth, Trach aspirate + staphylococcus aureus (mssa), she received zithromax x1, rocephinx3 days then ancef x3days, patient has been afebrile, no leukocytosis. Procalcitonin less 0.10, abx was discontinued.    3. Steroid induced hyperglycemia. She required ssi in the hospital, expect recover after steroids taper, she is to follow up with pmd.     DVT prophylaxis while in the hospital: enoxaparin  Code Status: full  Family Communication: mother and girlfriend at bedside  Disposition Plan: home  Consultants:   Pulmonary/criticaL CARE   Procedures:  intubation on 4/17, extubation on 4/19   Antimicrobials:   Ceftriaxone  Azithromycin     Discharge Exam: BP (!) 101/59 (BP Location: Left Leg)   Pulse 69   Temp 97.9 F (36.6 C)   Resp 16   Ht  (1.448 m)   Wt 72.9 kg (160 lb 11.5 oz)   LMP 09/23/2016   SpO2 96%   BMI 34.78 kg/m   General: NAD Cardiovascular: RRR Respiratory: CTABL Extremity: no edema  Discharge Instructions You were cared for by a hospitalist during your hospital stay. If you have any questions about your discharge medications or the care you received while you were in the hospital after you are discharged, you can call the unit and asked to speak with the hospitalist on call if the hospitalist that took care of you is not available. Once you are discharged, your primary care physician will handle any further medical issues. Please note that NO REFILLS for any discharge medications will be authorized once you are discharged, as it is imperative that you return to your primary care physician (or establish a relationship with a primary care physician if you do not have one) for your aftercare needs so that they can reassess your need for medications and monitor your lab values.  Discharge Instructions    Diet general    Complete by:  As directed    Increase activity slowly    Complete by:  As  directed      Allergies as of 10/17/2016   No Known Allergies     Medication List    STOP taking these medications   benzonatate 100 MG capsule Commonly known as:  TESSALON   HYDROcodone-homatropine 5-1.5 MG/5ML syrup Commonly known as:  HYCODAN   ibuprofen 600 MG tablet Commonly known as:  ADVIL,MOTRIN   ondansetron 4 MG disintegrating tablet Commonly known as:  ZOFRAN ODT     TAKE these medications   acetaminophen 500 MG tablet Commonly known as:  TYLENOL Take 500 mg by mouth every 6 (six) hours as needed for mild pain.   albuterol 108 (90 Base) MCG/ACT inhaler Commonly known as:  PROVENTIL HFA;VENTOLIN HFA Inhale 2 puffs into the lungs every 4 (four) hours as needed for wheezing or shortness of breath.   budesonide-formoterol 80-4.5 MCG/ACT inhaler Commonly known as:  SYMBICORT Inhale 2 puffs into the lungs 2 (two) times daily.   loratadine 10 MG tablet Commonly known as:  CLARITIN Take 1 tablet (10 mg total) by mouth daily.   montelukast 10 MG tablet Commonly known as:  SINGULAIR Take 1 tablet (10 mg total) by mouth at bedtime.   predniSONE 10 MG tablet Commonly known as:  DELTASONE Label  & dispense according to the schedule below. 4 Pills PO on day one, 3 pills  on day two, 2 pills on day three,  1 pill on day four, 1/2 Pill  PO on day five then STOP. What changed:  medication strength  how much to take  how to take this  when to take this  additional instructions      No Known Allergies Follow-up Information    [facility] Follow up in 1 month(s).   Specialty:  Pulmonology Why:  asthma evaluation and management Contact information: [address] [place] [address] [phone]       [facility] Follow up.   Specialty:  Family Medicine Why:  appointment scheduled for 10/25/2016 at 9:00 am. if you cannot keep this appointment please call to reschedule.  Contact information: [address]  [place]via He[place] [facility] [address] [phone]           The results of significant diagnostics from this hospitalization (including imaging, microbiology, ancillary and laboratory) are listed below for reference.    Significant Diagnostic Studies: Dg Chest Port 1 View  Result Date: 10/13/2016 CLINICAL DATA:  Intubation. EXAM: PORTABLE CHEST 1 VIEW COMPARISON:  10/12/2016. FINDINGS: Endotracheal tube and NG tube in stable position. Endotracheal tube tip 3 cm above the lower portion of the carina. Low lung volumes with basilar atelectasis. Interim improvement of right base infiltrate. No pleural effusion or pneumothorax. IMPRESSION: 1.  Lines and tubes in stable position. 2. Low lung volumes with bibasilar atelectasis. Interim partial clearing of right base infiltrate . Electronically Signed   By: Maisie Fus  Register   On: 10/13/2016 06:51   Portable Chest Xray  Result Date: 10/12/2016 CLINICAL DATA:  Respiratory failure EXAM: PORTABLE CHEST 1 VIEW COMPARISON:  10/11/2016 FINDINGS: Endotracheal tube and nasogastric catheter are seen in satisfactory position. There is new significant increased right basilar infiltrate likely related to the right lower lobe. The left lung remains clear. No bony abnormality is noted. IMPRESSION: Significant increase in right basilar infiltrate. Electronically Signed   By: Alcide Clever M.D.   On: 10/12/2016 07:08   Dg Chest Port 1 View  Result Date: 10/11/2016 CLINICAL DATA:  Acute respiratory failure. EXAM: PORTABLE CHEST 1 VIEW COMPARISON:  Earlier same day. FINDINGS: Endotracheal tube has tip 3.7 cm above the carina. Lungs are adequately inflated with persistent hazy opacification over the right base slightly improved. No effusion or pneumothorax. Cardiomediastinal silhouette and remainder of the exam is unchanged. IMPRESSION: Interval improvement in mild opacification over the right base. Endotracheal tube with tip 3.7 cm above the carina.  Electronically Signed   By: Elberta Fortis M.D.   On: 10/11/2016 21:40   Dg Chest Port 1 View  Result Date: 10/11/2016 CLINICAL DATA:  26 y/o  F; acute respiratory failure with hypoxemia. EXAM: PORTABLE CHEST 1 VIEW COMPARISON:  10/11/2016 chest radiograph. FINDINGS: Stable normal cardiac silhouette. No acute osseous abnormality is evident. Ill-defined right lower lobe opacity, possibly represent atelectasis or pneumonia. No pleural effusion or pneumothorax. IMPRESSION: Ill-defined right lower lobe opacity, possibly atelectasis or pneumonia. Electronically Signed   By: Mitzi Hansen M.D.   On: 10/11/2016 17:38   Dg Chest Port 1 View  Result Date: 10/11/2016 CLINICAL DATA:  Extreme shortness of breath.  Asthma. EXAM: PORTABLE CHEST 1 VIEW COMPARISON:  09/15/2016 FINDINGS: Chronic small area of obscured right diaphragmatic apex. There is no edema, consolidation, effusion, or pneumothorax. Normal lung volumes. Normal heart size and mediastinal contours. IMPRESSION: Negative portable chest. Electronically Signed   By: Kathrynn Ducking.D.  On: 10/11/2016 10:21   Dg Abd Portable 1v  Result Date: 10/12/2016 CLINICAL DATA:  Feeding tube placement. EXAM: PORTABLE ABDOMEN - 1 VIEW COMPARISON:  10/11/2016 FINDINGS: Enteric tube tip is in the right upper quadrant consistent with location in the distal stomach. Motion artifact limits examination but suggestion of infiltration in the right lung base. No gaseous distention of bowel in the upper abdomen. IMPRESSION: Enteric tube tip is in the right upper quadrant consistent with location in the distal stomach. Electronically Signed   By: Burman Nieves M.D.   On: 10/12/2016 01:40   Dg Abd Portable 1v  Result Date: 10/11/2016 CLINICAL DATA:  Enteric tube placement. EXAM: PORTABLE ABDOMEN - 1 VIEW COMPARISON:  CT 06/03/2016 FINDINGS: Enteric tube is present with tip over the mid abdomen just left of midline likely over the mid to distal stomach. Bowel  gas pattern is nonobstructive. Bones and soft tissues are within normal. IMPRESSION: Nonobstructive bowel gas pattern. Enteric tube with tip over the left mid abdomen likely in the mid to distal stomach. Electronically Signed   By: Elberta Fortis M.D.   On: 10/11/2016 21:41    Microbiology: Recent Results (from the past 240 hour(s))  Respiratory Panel by PCR     Status: Abnormal   Collection Time: 10/11/16 12:38 AM  Result Value Ref Range Status   Adenovirus NOT DETECTED NOT DETECTED Final   Coronavirus 229E NOT DETECTED NOT DETECTED Final   Coronavirus HKU1 NOT DETECTED NOT DETECTED Final   Coronavirus NL63 NOT DETECTED NOT DETECTED Final   Coronavirus OC43 NOT DETECTED NOT DETECTED Final   Metapneumovirus NOT DETECTED NOT DETECTED Final   Rhinovirus / Enterovirus DETECTED (A) NOT DETECTED Final   Influenza A NOT DETECTED NOT DETECTED Final   Influenza B NOT DETECTED NOT DETECTED Final   Parainfluenza Virus 1 NOT DETECTED NOT DETECTED Final   Parainfluenza Virus 2 NOT DETECTED NOT DETECTED Final   Parainfluenza Virus 3 NOT DETECTED NOT DETECTED Final   Parainfluenza Virus 4 NOT DETECTED NOT DETECTED Final   Respiratory Syncytial Virus NOT DETECTED NOT DETECTED Final   Bordetella pertussis NOT DETECTED NOT DETECTED Final   Chlamydophila pneumoniae NOT DETECTED NOT DETECTED Final   Mycoplasma pneumoniae NOT DETECTED NOT DETECTED Final  MRSA PCR Screening     Status: None   Collection Time: 10/11/16  4:53 PM  Result Value Ref Range Status   MRSA by PCR NEGATIVE NEGATIVE Final    Comment:        The GeneXpert MRSA Assay (FDA approved for NASAL specimens only), is one component of a comprehensive MRSA colonization surveillance program. It is not intended to diagnose MRSA infection nor to guide or monitor treatment for MRSA infections.   Culture, blood (routine x 2) Call MD if unable to obtain prior to antibiotics being given     Status: None   Collection Time: 10/11/16  6:03 PM    Result Value Ref Range Status   Specimen Description BLOOD RIGHT ANTECUBITAL  Final   Special Requests BOTTLES DRAWN AEROBIC AND ANAEROBIC BCAV  Final   Culture NO GROWTH 5 DAYS  Final   Report Status 10/16/2016 FINAL  Final  Culture, blood (routine x 2) Call MD if unable to obtain prior to antibiotics being given     Status: None   Collection Time: 10/11/16  6:08 PM  Result Value Ref Range Status   Specimen Description BLOOD LEFT ANTECUBITAL  Final   Special Requests IN PEDIATRIC BOTTLE BCAV  Final  Culture NO GROWTH 5 DAYS  Final   Report Status 10/16/2016 FINAL  Final  Culture, respiratory (NON-Expectorated)     Status: None   Collection Time: 10/12/16  7:45 AM  Result Value Ref Range Status   Specimen Description TRACHEAL ASPIRATE  Final   Special Requests NONE  Final   Gram Stain   Final    ABUNDANT WBC PRESENT,BOTH PMN AND MONONUCLEAR NO ORGANISMS SEEN    Culture FEW STAPHYLOCOCCUS AUREUS  Final   Report Status 10/14/2016 FINAL  Final   Organism ID, Bacteria STAPHYLOCOCCUS AUREUS  Final      Susceptibility   Staphylococcus aureus - MIC*    CIPROFLOXACIN <=0.5 SENSITIVE Sensitive     ERYTHROMYCIN <=0.25 SENSITIVE Sensitive     GENTAMICIN <=0.5 SENSITIVE Sensitive     OXACILLIN 0.5 SENSITIVE Sensitive     TETRACYCLINE >=16 RESISTANT Resistant     VANCOMYCIN 1 SENSITIVE Sensitive     TRIMETH/SULFA <=10 SENSITIVE Sensitive     CLINDAMYCIN <=0.25 SENSITIVE Sensitive     RIFAMPIN <=0.5 SENSITIVE Sensitive     Inducible Clindamycin NEGATIVE Sensitive     * FEW STAPHYLOCOCCUS AUREUS     Labs: Basic Metabolic Panel:  Recent Labs Lab 10/11/16 1727  10/12/16 0225 10/12/16 1125 10/12/16 1821 10/13/16 0413 10/14/16 0323 10/15/16 0809 10/17/16 0915  NA  --   < > 137  --   --  140 139 137 139  K  --   < > 4.0  --   --  3.6 3.6 4.1 2.9*  CL  --   < > 105  --   --  105 100* 102 101  CO2  --   < > 23  --   --  GLUCOSE  --   < > 145*  --   --  152* 126* 113*  113*  BUN  --   < > 8  --   --  17 24* 19 14  CREATININE 0.88  < > 0.75  --   --  0.84 0.78 0.68 0.80  CALCIUM  --   < > 8.1*  --   --  8.4* 8.5* 8.5* 8.4*  MG 1.9  --  1.9 2.2 2.4 2.4  --   --   --   PHOS  --   --  3.3 3.2 3.3 3.8  --   --   --   < > = values in this interval not displayed. Liver Function Tests:  Recent Labs Lab 10/13/16 0413  AST 22  ALT 15  ALKPHOS 49  BILITOT 0.5  PROT 7.1  ALBUMIN 3.4*   No results for input(s): LIPASE, AMYLASE in the last 168 hours. No results for input(s): AMMONIA in the last 168 hours. CBC:  Recent Labs Lab 10/11/16 1023 10/11/16 1727 10/12/16 0225 10/13/16 0413 10/15/16 0809 10/17/16 0915  WBC 11.0* 12.9* 16.6* 15.1* 12.7* 12.2*  NEUTROABS 8.9*  --   --   --   --  4.4  HGB 10.9* 11.4* 10.6* 10.8* 11.6* 11.7*  HCT 34.7* 36.9 34.5* 35.9* 39.0 38.1  MCV 73.7* 73.5* 72.9* 74.2* 74.3* 73.8*  PLT 543* 559* 522* 501* 498* 465*   Cardiac Enzymes:  Recent Labs Lab 10/11/16 1808  TROPONINI <0.03   BNP: BNP (last 3 results) No results for input(s): BNP in the last 8760 hours.  ProBNP (last 3 results) No results for input(s): PROBNP in the last 8760 hours.  CBG:  Recent  Labs Lab 10/16/16 2029 10/17/16 0002 10/17/16 0520 10/17/16 0740 10/17/16 1118  GLUCAP 102* 119* 84 85 144*       Signed:  Thanvi Blincoe MD, PhD  [facility] 10/17/2016, 12:05 PM

## 2016-10-17 NOTE — Care Management Note (Signed)
Case Management Note  Patient Details  Name: Andrea Daniels MRN: [id number] Date of Birth: March 19, 1991  Subjective/Objective:      Asthma exacerbation              Action/Plan: Discharge Planning: Appointment arranged at [facility] on Oct 25, 2016 at 9 am. Provided pt with Symbicort coupon to receive first inhaler for free. Provided pt with [employer] patient assistance application to take with her to her initial appt to receive Symbicort up to a year for free. Singular is $17 and Prednisone is $4 at [employer]. Pt states she works but does not have insurance. Provided her with information for self pay clinic in [facility]. She will have to go to clinic to pick up eligibility application.    Expected Discharge Date:  10/17/16               Expected Discharge Plan:  Home/Self Care  In-House Referral:  NA  Discharge planning Services  CM Consult, Follow-up appt scheduled  Post Acute Care Choice:  NA Choice offered to:  NA  DME Arranged:  N/A DME Agency:  NA  HH Arranged:  NA HH Agency:  NA  Status of Service:  Completed, signed off  If discussed at Long Length of Stay Meetings, dates discussed:    Additional Comments:  Elliot Cousin, RN 10/17/2016, 11:55 AM

## 2016-10-17 NOTE — Progress Notes (Signed)
Andrea Daniels to be D/C'd to home per MD order.  Discussed with the patient and all questions fully answered.  VSS, Skin clean, dry and intact without evidence of skin break down, no evidence of skin tears noted. IV catheter discontinued intact. Site without signs and symptoms of complications. Dressing and pressure applied.  An After Visit Summary was printed and given to the patient. Patient received prescription and forms for free/reduced cost medications.  D/c education completed with patient/family including follow up instructions, medication list, d/c activities limitations if indicated, with other d/c instructions as indicated by MD - patient able to verbalize understanding, all questions fully answered.   Patient instructed to return to ED, call 911, or call MD for any changes in condition.   Patient escorted via WC, and D/C home via private auto.  [name] Price 10/17/2016 1:38 PM

## 2016-10-18 LAB — PATHOLOGIST SMEAR REVIEW

## 2016-10-25 ENCOUNTER — Ambulatory Visit (INDEPENDENT_AMBULATORY_CARE_PROVIDER_SITE_OTHER): Payer: Self-pay | Admitting: Physician Assistant

## 2016-10-25 ENCOUNTER — Encounter (INDEPENDENT_AMBULATORY_CARE_PROVIDER_SITE_OTHER): Payer: Self-pay | Admitting: Physician Assistant

## 2016-10-25 VITALS — BP 126/66 | HR 85 | Temp 98.4°F | Resp 18 | Ht 59.0 in | Wt 161.0 lb

## 2016-10-25 DIAGNOSIS — D649 Anemia, unspecified: Secondary | ICD-10-CM

## 2016-10-25 DIAGNOSIS — J4552 Severe persistent asthma with status asthmaticus: Secondary | ICD-10-CM

## 2016-10-25 DIAGNOSIS — E876 Hypokalemia: Secondary | ICD-10-CM

## 2016-10-25 MED ORDER — LORATADINE 10 MG PO TABS: 10.0000 mg | ORAL_TABLET | Freq: Every day | ORAL | 0 refills | Status: DC

## 2016-10-25 MED ORDER — BUDESONIDE-FORMOTEROL FUMARATE 80-4.5 MCG/ACT IN AERO: 2.0000 | INHALATION_SPRAY | Freq: Two times a day (BID) | RESPIRATORY_TRACT | 5 refills | Status: DC

## 2016-10-25 MED ORDER — MONTELUKAST SODIUM 10 MG PO TABS: 10.0000 mg | ORAL_TABLET | Freq: Every day | ORAL | 1 refills | Status: DC

## 2016-10-25 MED ORDER — ALBUTEROL SULFATE HFA 108 (90 BASE) MCG/ACT IN AERS: 2.0000 | INHALATION_SPRAY | RESPIRATORY_TRACT | 5 refills | Status: DC | PRN

## 2016-10-25 NOTE — Patient Instructions (Signed)

## 2016-10-25 NOTE — Progress Notes (Signed)
Subjective:  Patient ID: Andrea Daniels, female    DOB: 1991/01/27  Age: 26 y.o. MRN: [id number]  CC: Hospital f/u  HPI Andrea Daniels is a 25 y.o. female with a PMH of Asthma presents on hospital f/u for acute hypoxic respiratory failure. Likely due to asthma exacerbation, respiratory viral panel positive for rhinovirus.  Bacterial PNA ruled out, procalcitonin less than 0.10, abx was discontinued. Laboratory work up revealed leukocytosis with white blood cells count of 11,000, mild anemia with hemoglobin 10.6, thrombocythemia, platelet count of 543, hypokalemia of 2.9. Had DVT prophylaxis while in hospital. She also had steroid induced hyperglycemia. She was prescribed Albuterol, Singulair, and Symbicort. Reports feeling much better with no current symptoms. Denies any other symptoms.    ROS Review of Systems  Constitutional: Negative for chills, fever and malaise/fatigue.  Eyes: Negative for blurred vision.  Respiratory: Negative for shortness of breath.   Cardiovascular: Negative for chest pain and palpitations.  Gastrointestinal: Negative for abdominal pain and nausea.  Genitourinary: Negative for dysuria and hematuria.  Musculoskeletal: Negative for joint pain and myalgias.  Skin: Negative for rash.  Neurological: Negative for tingling and headaches.  Psychiatric/Behavioral: Negative for depression. The patient is not nervous/anxious.     Objective:  BP 126/66 (BP Location: Right Arm, Patient Position: Sitting, Cuff Size: Large)   Pulse 85   Temp 98.4 F (36.9 C) (Oral)   Resp 18   Ht  (1.499 m)   Wt 161 lb (73 kg)   LMP 10/11/2016   SpO2 100%   BMI 32.52 kg/m   BP/Weight 10/25/2016 10/17/2016 10/13/2016  Systolic BP 126 101 -  Diastolic BP 66 59 -  Wt. (Lbs) 161 - 160.72  BMI 32.52 - 34.78      Physical Exam  Constitutional: She is oriented to person, place, and time.  Well developed, overweight, NAD, polite  HENT:  Head: Normocephalic and atraumatic.  Eyes:  No scleral icterus.  Neck: Normal range of motion. Neck supple. No thyromegaly present.  Cardiovascular: Normal rate, regular rhythm and normal heart sounds.   Pulmonary/Chest: Effort normal and breath sounds normal. No respiratory distress. She has no wheezes. She has no rales. She exhibits no tenderness.  Musculoskeletal: She exhibits no edema.  Lymphadenopathy:    She has no cervical adenopathy.  Neurological: She is alert and oriented to person, place, and time. No cranial nerve deficit. Coordination normal.  Skin: Skin is warm and dry. No rash noted. No erythema. No pallor.  No cyanosis  Psychiatric: She has a normal mood and affect. Her behavior is normal. Thought content normal.  Vitals reviewed.    Assessment & Plan:   1. Severe persistent asthma with status asthmaticus - albuterol (PROVENTIL HFA;VENTOLIN HFA) 108 (90 Base) MCG/ACT inhaler; Inhale 2 puffs into the lungs every 4 (four) hours as needed for wheezing or shortness of breath.  Dispense: 1 Inhaler; Refill: 5 - budesonide-formoterol (SYMBICORT) 80-4.5 MCG/ACT inhaler; Inhale 2 puffs into the lungs 2 (two) times daily.  Dispense: 1 Inhaler; Refill: 5 - loratadine (CLARITIN) 10 MG tablet; Take 1 tablet (10 mg total) by mouth daily.  Dispense: 90 tablet; Refill: 0 - montelukast (SINGULAIR) 10 MG tablet; Take 1 tablet (10 mg total) by mouth at bedtime.  Dispense: 90 tablet; Refill: 1  2. Anemia, unspecified type - CBC with Differential  3. Hypokalemia - Comprehensive metabolic panel   Meds ordered this encounter  Medications  . albuterol (PROVENTIL HFA;VENTOLIN HFA) 108 (90 Base) MCG/ACT inhaler  Sig: Inhale 2 puffs into the lungs every 4 (four) hours as needed for wheezing or shortness of breath.    Dispense:  1 Inhaler    Refill:  5    Order Specific Question:   Supervising Provider    Answer:   Quentin Angst [id number]  . budesonide-formoterol (SYMBICORT) 80-4.5 MCG/ACT inhaler    Sig: Inhale 2 puffs  into the lungs 2 (two) times daily.    Dispense:  1 Inhaler    Refill:  5    Order Specific Question:   Supervising Provider    Answer:   Quentin Angst [id number]  . loratadine (CLARITIN) 10 MG tablet    Sig: Take 1 tablet (10 mg total) by mouth daily.    Dispense:  90 tablet    Refill:  0    Order Specific Question:   Supervising Provider    Answer:   Quentin Angst [id number]  . montelukast (SINGULAIR) 10 MG tablet    Sig: Take 1 tablet (10 mg total) by mouth at bedtime.    Dispense:  90 tablet    Refill:  1    Order Specific Question:   Supervising Provider    Answer:   Quentin Angst [id number]    Follow-up: Return if symptoms worsen or fail to improve.   Loletta Specter PA

## 2016-10-25 NOTE — Progress Notes (Signed)
Patient is here for HFU  Patient denies pain at this time.  Patient has not eaten today. Patient has not taken medication today.

## 2016-10-26 ENCOUNTER — Other Ambulatory Visit (INDEPENDENT_AMBULATORY_CARE_PROVIDER_SITE_OTHER): Payer: Self-pay | Admitting: Physician Assistant

## 2016-10-26 DIAGNOSIS — D649 Anemia, unspecified: Secondary | ICD-10-CM

## 2016-10-26 LAB — CBC WITH DIFFERENTIAL/PLATELET
Basophils Absolute: 0 10*3/uL (ref 0.0–0.2)
Basos: 0 %
EOS (ABSOLUTE): 0.1 10*3/uL (ref 0.0–0.4)
Eos: 1 %
Hematocrit: 35.7 % (ref 34.0–46.6)
Hemoglobin: 10.7 g/dL — ABNORMAL LOW (ref 11.1–15.9)
Immature Grans (Abs): 0 10*3/uL (ref 0.0–0.1)
Immature Granulocytes: 1 %
Lymphocytes Absolute: 3.3 10*3/uL — ABNORMAL HIGH (ref 0.7–3.1)
Lymphs: 40 %
MCH: 22.2 pg — ABNORMAL LOW (ref 26.6–33.0)
MCHC: 30 g/dL — ABNORMAL LOW (ref 31.5–35.7)
MCV: 74 fL — ABNORMAL LOW (ref 79–97)
Monocytes Absolute: 0.6 10*3/uL (ref 0.1–0.9)
Monocytes: 7 %
Neutrophils Absolute: 4.2 10*3/uL (ref 1.4–7.0)
Neutrophils: 51 %
Platelets: 296 10*3/uL (ref 150–379)
RBC: 4.82 x10E6/uL (ref 3.77–5.28)
RDW: 15.7 % — ABNORMAL HIGH (ref 12.3–15.4)
WBC: 8.3 10*3/uL (ref 3.4–10.8)

## 2016-10-26 LAB — COMPREHENSIVE METABOLIC PANEL
ALT: 18 IU/L (ref 0–32)
AST: 23 IU/L (ref 0–40)
Albumin/Globulin Ratio: 1.4 (ref 1.2–2.2)
Albumin: 3.9 g/dL (ref 3.5–5.5)
Alkaline Phosphatase: 52 IU/L (ref 39–117)
BUN/Creatinine Ratio: 12 (ref 9–23)
BUN: 10 mg/dL (ref 6–20)
Bilirubin Total: 0.3 mg/dL (ref 0.0–1.2)
CO2: 20 mmol/L (ref 18–29)
Calcium: 8.7 mg/dL (ref 8.7–10.2)
Chloride: 105 mmol/L (ref 96–106)
Creatinine, Ser: 0.84 mg/dL (ref 0.57–1.00)
GFR calc Af Amer: 111 mL/min/{1.73_m2} (ref >59)
GFR calc non Af Amer: 96 mL/min/{1.73_m2} (ref >59)
Globulin, Total: 2.8 g/dL (ref 1.5–4.5)
Glucose: 90 mg/dL (ref 65–99)
Potassium: 4.2 mmol/L (ref 3.5–5.2)
Sodium: 140 mmol/L (ref 134–144)
Total Protein: 6.7 g/dL (ref 6.0–8.5)

## 2016-10-26 MED ORDER — FERROUS SULFATE 325 (65 FE) MG PO TABS: 325.0000 mg | ORAL_TABLET | Freq: Three times a day (TID) | ORAL | 0 refills | Status: DC

## 2016-10-26 NOTE — Progress Notes (Signed)
Anemia

## 2016-12-10 ENCOUNTER — Encounter (HOSPITAL_COMMUNITY): Payer: Self-pay | Admitting: Nurse Practitioner

## 2016-12-10 ENCOUNTER — Emergency Department (HOSPITAL_COMMUNITY)
Admission: EM | Admit: 2016-12-10 | Discharge: 2016-12-10 | Disposition: A | Discharge status: Home / Self Care | Payer: 59 | Attending: Emergency Medicine | Admitting: Emergency Medicine

## 2016-12-10 DIAGNOSIS — N946 Dysmenorrhea, unspecified: Secondary | ICD-10-CM

## 2016-12-10 DIAGNOSIS — J45909 Unspecified asthma, uncomplicated: Secondary | ICD-10-CM | POA: Insufficient documentation

## 2016-12-10 DIAGNOSIS — Z87891 Personal history of nicotine dependence: Secondary | ICD-10-CM | POA: Insufficient documentation

## 2016-12-10 DIAGNOSIS — Z79899 Other long term (current) drug therapy: Secondary | ICD-10-CM | POA: Insufficient documentation

## 2016-12-10 MED ORDER — CEPHALEXIN 500 MG PO CAPS: 500.0000 mg | ORAL_CAPSULE | Freq: Once | ORAL | Status: DC

## 2016-12-10 MED ORDER — HYDROCODONE-ACETAMINOPHEN 5-325 MG PO TABS
1.0000 | ORAL_TABLET | Freq: Once | ORAL | Status: AC
  Administered 2016-12-10: 1 via ORAL
  Filled 2016-12-10: qty 1

## 2016-12-10 MED ORDER — HYDROCODONE-ACETAMINOPHEN 5-325 MG PO TABS: 1.0000 | ORAL_TABLET | ORAL | 0 refills | Status: DC | PRN

## 2016-12-10 NOTE — ED Provider Notes (Signed)
[facility]-EMERGENCY DEPT Provider Note   CSN: [id number] Arrival date & time: 12/10/16  0100     History   Chief Complaint Chief Complaint  Patient presents with  . Abdominal Pain    HPI Andrea Daniels is a 26 y.o. female.  Patient is here with complaint of menstrual cramping. She reports history of the same that occur infrequently, last episode was January 2018. No fever, vaginal discharge, dysuria. She denies lightheadedness, syncope or near syncope. Her symptoms started yesterday and are no better with ibuprofen 800 mg.    The history is provided by the patient. No language interpreter was used.  Abdominal Pain   Pertinent negatives include fever, nausea, vomiting, dysuria and myalgias.    Past Medical History:  Diagnosis Date  . Asthma   . Gastritis   . Gastritis   . Ovarian cyst   . UTI (lower urinary tract infection)     Patient Active Problem List   Diagnosis Date Noted  . Acute respiratory failure (HCC) 10/12/2016  . Acute respiratory failure with hypoxia (HCC) 10/11/2016  . Hypokalemia 10/11/2016  . Lobar pneumonia (HCC)   . CAP (community acquired pneumonia) 06/03/2016  . Epigastric abdominal pain   . Tachycardia 06/02/2016  . Leukocytosis 06/02/2016  . Community acquired pneumonia of right lung 06/02/2016  . Asthma 06/02/2016  . Hypoxia   . Hypophosphatemia 03/20/2015  . Low TSH level 03/20/2015  . Metabolic acidosis 03/20/2015  . Microcytic anemia 03/20/2015  . Exacerbation of asthma 03/19/2015  . Elevated glucose 07/03/2014  . Asthma with exacerbation 10/22/2013  . Hives 07/11/2013  . Status asthmaticus 06/08/2013  . Nausea vomiting and diarrhea 08/16/2012  . Allergic rhinitis 04/24/2012  . Preventative health care 08/14/2011    Past Surgical History:  Procedure Laterality Date  . NO PAST SURGERIES      OB History    Gravida Para Term Preterm AB Living   0             SAB TAB Ectopic Multiple Live Births                   Home  Medications    Prior to Admission medications   Medication Sig Start Date End Date Taking? Authorizing Provider  albuterol (PROVENTIL HFA;VENTOLIN HFA) 108 (90 Base) MCG/ACT inhaler Inhale 2 puffs into the lungs every 4 (four) hours as needed for wheezing or shortness of breath. 10/25/16   Loletta SpecterGomez, Roger David, PA-C  budesonide-formoterol [facility](SYMBICORT) 80-4.5 MCG/ACT inhaler Inhale 2 puffs into the lungs 2 (two) times daily. 10/25/16   Loletta SpecterGomez, Roger David, PA-C  ferrous sulfate 325 (65 FE) MG tablet Take 1 tablet (325 mg total) by mouth 3 (three) times daily with meals. 10/26/16   Loletta SpecterGomez, Roger David, PA-C  HYDROcodone-acetaminophen (NORCO/VICODIN) 5-325 MG tablet Take 1 tablet by mouth every 4 (four) hours as needed. 12/10/16   Elpidio AnisUpstill, Carah Barrientes, PA-C  loratadine (CLARITIN) 10 MG tablet Take 1 tablet (10 mg total) by mouth daily. 10/25/16   Loletta SpecterGomez, Roger David, PA-C  montelukast (SINGULAIR) 10 MG tablet Take 1 tablet (10 mg total) by mouth at bedtime. 10/25/16   Loletta SpecterGomez, Roger David, PA-C    Family History Family History  Problem Relation Age of Onset  . Arthritis Other   . Cancer Other        breast cancer  . Hypertension Other   . Asthma Other   . Hyperlipidemia Other   . Hypertension Other   . Diabetes Other  Social History Social History  Substance Use Topics  . Smoking status: Former Smoker    Packs/day: 0.10    Types: Cigarettes  . Smokeless tobacco: Never Used  . Alcohol use Yes     Comment: Once a week      Allergies   Patient has no known allergies.   Review of Systems Review of Systems  Constitutional: Negative for chills and fever.  Gastrointestinal: Negative for abdominal pain, nausea and vomiting.  Genitourinary: Positive for menstrual problem and pelvic pain. Negative for dysuria and vaginal discharge.  Musculoskeletal: Negative.  Negative for myalgias.  Skin: Negative.  Negative for pallor.  Neurological: Negative.  Negative for syncope, weakness and light-headedness.      Physical Exam Updated Vital Signs BP (!) 146/103 (BP Location: Left Arm)   Pulse 79   Temp 98 F (36.7 C) (Oral)   Resp 16   Ht 4\' 11"  (1.499 m)   Wt 73 kg (161 lb)   SpO2 99%   BMI 32.52 kg/m   Physical Exam  Constitutional: She is oriented to person, place, and time. She appears well-developed and well-nourished.  Neck: Normal range of motion.  Pulmonary/Chest: Effort normal.  Abdominal: Soft. There is no guarding.  Suprapubic abdominal tenderness.   Neurological: She is alert and oriented to person, place, and time.  Skin: Skin is warm and dry.     ED Treatments / Results  Labs (all labs ordered are listed, but only abnormal results are displayed) Labs Reviewed - No data to display  EKG  EKG Interpretation None       Radiology No results found.  Procedures Procedures (including critical care time)  Medications Ordered in ED Medications  HYDROcodone-acetaminophen (NORCO/VICODIN) 5-325 MG per tablet 1 tablet (1 tablet Oral Given 12/10/16 0352)     Initial Impression / Assessment and Plan / ED Course  I have reviewed the triage vital signs and the nursing notes.  Pertinent labs & imaging results that were available during my care of the patient were reviewed by me and considered in my medical decision making (see chart for details).     Patient is having recurrent symptoms of dysmenorrhea. She reports current symptoms are the same as previous episodes. She reports her partner is a woman and denies any chance of pregnancy. No lightheadedness or signs of anemia. VSS.   She can be discharged home with primary care follow up prn.   Final Clinical Impressions(s) / ED Diagnoses   Final diagnoses:  Dysmenorrhea    New Prescriptions Discharge Medication List as of 12/10/2016  3:34 AM    START taking these medications   Details  HYDROcodone-acetaminophen (NORCO/VICODIN) 5-325 MG tablet Take 1 tablet by mouth every 4 (four) hours as needed., Starting  Sat 12/10/2016, Print         Elpidio Anis, PA-C 12/10/16 0406    Melene Plan, DO 12/10/16 4098

## 2016-12-10 NOTE — ED Triage Notes (Signed)
Pt states she is having menstrual related abdominal cramping. Rates pain 8/10. Denies urinary symptoms.

## 2016-12-10 NOTE — ED Notes (Signed)
Pt stated she has abnormal periods that give her pain 2-3 x a year. Pt c/o 8/10 cramping pain.

## 2016-12-10 NOTE — Discharge Instructions (Signed)
Follow up with your doctor if symptoms persist.  Return to the emergency department if symptoms worsen - lightheadedness or passing out, persistent heavy flow bleeding or for new concern.

## 2017-02-05 ENCOUNTER — Emergency Department (HOSPITAL_COMMUNITY)
Admission: EM | Admit: 2017-02-05 | Discharge: 2017-02-05 | Disposition: A | Discharge status: Home / Self Care | Payer: Self-pay | Attending: Emergency Medicine | Admitting: Emergency Medicine

## 2017-02-05 ENCOUNTER — Emergency Department (HOSPITAL_COMMUNITY): Payer: Self-pay

## 2017-02-05 ENCOUNTER — Encounter (HOSPITAL_COMMUNITY): Payer: Self-pay

## 2017-02-05 DIAGNOSIS — Z79899 Other long term (current) drug therapy: Secondary | ICD-10-CM | POA: Insufficient documentation

## 2017-02-05 DIAGNOSIS — R1084 Generalized abdominal pain: Secondary | ICD-10-CM | POA: Insufficient documentation

## 2017-02-05 DIAGNOSIS — R109 Unspecified abdominal pain: Secondary | ICD-10-CM

## 2017-02-05 DIAGNOSIS — J45909 Unspecified asthma, uncomplicated: Secondary | ICD-10-CM | POA: Insufficient documentation

## 2017-02-05 DIAGNOSIS — N390 Urinary tract infection, site not specified: Secondary | ICD-10-CM | POA: Insufficient documentation

## 2017-02-05 DIAGNOSIS — Z87891 Personal history of nicotine dependence: Secondary | ICD-10-CM | POA: Insufficient documentation

## 2017-02-05 LAB — CBC
HCT: 39.4 % (ref 36.0–46.0)
Hemoglobin: 12.9 g/dL (ref 12.0–15.0)
MCH: 25.1 pg — ABNORMAL LOW (ref 26.0–34.0)
MCHC: 32.7 g/dL (ref 30.0–36.0)
MCV: 76.7 fL — ABNORMAL LOW (ref 78.0–100.0)
Platelets: 344 10*3/uL (ref 150–400)
RBC: 5.14 MIL/uL — ABNORMAL HIGH (ref 3.87–5.11)
RDW: 17.2 % — ABNORMAL HIGH (ref 11.5–15.5)
WBC: 6 10*3/uL (ref 4.0–10.5)

## 2017-02-05 LAB — URINALYSIS, ROUTINE W REFLEX MICROSCOPIC
Bilirubin Urine: NEGATIVE
Glucose, UA: NEGATIVE mg/dL
Hgb urine dipstick: NEGATIVE
Ketones, ur: NEGATIVE mg/dL
Nitrite: NEGATIVE
Protein, ur: NEGATIVE mg/dL
Specific Gravity, Urine: 1.019 (ref 1.005–1.030)
pH: 5 (ref 5.0–8.0)

## 2017-02-05 LAB — COMPREHENSIVE METABOLIC PANEL
ALT: 25 U/L (ref 14–54)
AST: 28 U/L (ref 15–41)
Albumin: 4 g/dL (ref 3.5–5.0)
Alkaline Phosphatase: 57 U/L (ref 38–126)
Anion gap: 7 (ref 5–15)
BUN: 8 mg/dL (ref 6–20)
CO2: 24 mmol/L (ref 22–32)
Calcium: 8.6 mg/dL — ABNORMAL LOW (ref 8.9–10.3)
Chloride: 109 mmol/L (ref 101–111)
Creatinine, Ser: 0.8 mg/dL (ref 0.44–1.00)
GFR calc Af Amer: >60 mL/min (ref >60)
GFR calc non Af Amer: >60 mL/min (ref >60)
Glucose, Bld: 92 mg/dL (ref 65–99)
Potassium: 3.6 mmol/L (ref 3.5–5.1)
Sodium: 140 mmol/L (ref 135–145)
Total Bilirubin: 0.6 mg/dL (ref 0.3–1.2)
Total Protein: 7.6 g/dL (ref 6.5–8.1)

## 2017-02-05 LAB — LIPASE, BLOOD: Lipase: 33 U/L (ref 11–51)

## 2017-02-05 MED ORDER — PHENAZOPYRIDINE HCL 200 MG PO TABS: 200.0000 mg | ORAL_TABLET | Freq: Three times a day (TID) | ORAL | 0 refills | Status: DC

## 2017-02-05 MED ORDER — CEPHALEXIN 500 MG PO CAPS: 500.0000 mg | ORAL_CAPSULE | Freq: Four times a day (QID) | ORAL | 0 refills | Status: DC

## 2017-02-05 MED ORDER — CEPHALEXIN 500 MG PO CAPS
500.0000 mg | ORAL_CAPSULE | Freq: Once | ORAL | Status: AC
  Administered 2017-02-05: 500 mg via ORAL
  Filled 2017-02-05: qty 1

## 2017-02-05 NOTE — ED Provider Notes (Signed)
[facility]ER[facility] DEPT Provider Note   CSN: [id number] Arrival date & time: 02/05/17  1531     History   Chief Complaint No chief complaint on file.   HPI Season L Jenelle MagesHairston is a 26 y.o. female.  Complains of epigastric and suprapubic abdominal pain for 3 days with associated urinary frequency and dysuria. She has a remote history of a urinary tract infection and thinks this is similar pain. Last menstrual period July 23. No fever, sweats, chills, CVAT. She is eating. Severity of symptoms is mild.      Past Medical History:  Diagnosis Date  . Asthma   . Gastritis   . Gastritis   . Ovarian cyst   . UTI (lower urinary tract infection)     Patient Active Problem List   Diagnosis Date Noted  . Acute respiratory failure (HCC) 10/12/2016  . Acute respiratory failure with hypoxia (HCC) 10/11/2016  . Hypokalemia 10/11/2016  . Lobar pneumonia (HCC)   . CAP (community acquired pneumonia) 06/03/2016  . Epigastric abdominal pain   . Tachycardia 06/02/2016  . Leukocytosis 06/02/2016  . Community acquired pneumonia of right lung 06/02/2016  . Asthma 06/02/2016  . Hypoxia   . Hypophosphatemia 03/20/2015  . Low TSH level 03/20/2015  . Metabolic acidosis 03/20/2015  . Microcytic anemia 03/20/2015  . Exacerbation of asthma 03/19/2015  . Elevated glucose 07/03/2014  . Asthma with exacerbation 10/22/2013  . Hives 07/11/2013  . Status asthmaticus 06/08/2013  . Nausea vomiting and diarrhea 08/16/2012  . Allergic rhinitis 04/24/2012  . Preventative health care 08/14/2011    Past Surgical History:  Procedure Laterality Date  . NO PAST SURGERIES      OB History    Gravida Para Term Preterm AB Living   0             SAB TAB Ectopic Multiple Live Births                   Home Medications    Prior to Admission medications   Medication Sig Start Date End Date Taking? Authorizing Provider  albuterol (PROVENTIL HFA;VENTOLIN HFA) 108 (90 Base) MCG/ACT inhaler Inhale 2 puffs into  the lungs every 4 (four) hours as needed for wheezing or shortness of breath. 10/25/16   Loletta SpecterGomez, Roger David, PA-C  budesonide-formoterol [facility](SYMBICORT) 80-4.5 MCG/ACT inhaler Inhale 2 puffs into the lungs 2 (two) times daily. 10/25/16   Loletta SpecterGomez, Roger David, PA-C  cephALEXin (KEFLEX) 500 MG capsule Take 1 capsule (500 mg total) by mouth 4 (four) times daily. 02/05/17   Donnetta Hutchingook, Elowyn Raupp, MD  ferrous sulfate 325 (65 FE) MG tablet Take 1 tablet (325 mg total) by mouth 3 (three) times daily with meals. 10/26/16   Loletta SpecterGomez, Roger David, PA-C  HYDROcodone-acetaminophen (NORCO/VICODIN) 5-325 MG tablet Take 1 tablet by mouth every 4 (four) hours as needed. 12/10/16   Elpidio AnisUpstill, Shari, PA-C  loratadine (CLARITIN) 10 MG tablet Take 1 tablet (10 mg total) by mouth daily. 10/25/16   Loletta SpecterGomez, Roger David, PA-C  montelukast (SINGULAIR) 10 MG tablet Take 1 tablet (10 mg total) by mouth at bedtime. 10/25/16   Loletta SpecterGomez, Roger David, PA-C  phenazopyridine (PYRIDIUM) 200 MG tablet Take 1 tablet (200 mg total) by mouth 3 (three) times daily. 02/05/17   Donnetta Hutchingook, Jayleena Stille, MD    Family History Family History  Problem Relation Age of Onset  . Arthritis Other   . Cancer Other        breast cancer  . Hypertension Other   . Asthma  Other   . Hyperlipidemia Other   . Hypertension Other   . Diabetes Other     Social History Social History  Substance Use Topics  . Smoking status: Former Smoker    Packs/day: 0.10    Types: Cigarettes  . Smokeless tobacco: Never Used  . Alcohol use Yes     Comment: Once a week      Allergies   Patient has no known allergies.   Review of Systems Review of Systems  All other systems reviewed and are negative.    Physical Exam Updated Vital Signs BP (!) 151/95   Pulse 79   Temp 98.7 F (37.1 C) (Oral)   Resp 19   LMP 01/16/2017   SpO2 99%   Physical Exam  Constitutional: She is oriented to person, place, and time. She appears well-developed and well-nourished.  HENT:  Head: Normocephalic and  atraumatic.  Eyes: Conjunctivae are normal.  Neck: Neck supple.  Cardiovascular: Normal rate and regular rhythm.   Pulmonary/Chest: Effort normal and breath sounds normal.  Abdominal:  Slight suprapubic tenderness  Genitourinary:  Genitourinary Comments: Minimal left CVAT.  Musculoskeletal: Normal range of motion.  Neurological: She is alert and oriented to person, place, and time.  Skin: Skin is warm and dry.  Psychiatric: She has a normal mood and affect. Her behavior is normal.  Nursing note and vitals reviewed.    ED Treatments / Results  Labs (all labs ordered are listed, but only abnormal results are displayed) Labs Reviewed  COMPREHENSIVE METABOLIC PANEL - Abnormal; Notable for the following:       Result Value   Calcium 8.6 (*)    All other components within normal limits  CBC - Abnormal; Notable for the following:    RBC 5.14 (*)    MCV 76.7 (*)    MCH 25.1 (*)    RDW 17.2 (*)    All other components within normal limits  URINALYSIS, ROUTINE W REFLEX MICROSCOPIC - Abnormal; Notable for the following:    APPearance HAZY (*)    Leukocytes, UA SMALL (*)    Bacteria, UA MANY (*)    Squamous Epithelial / LPF 0-5 (*)    All other components within normal limits  URINE CULTURE  LIPASE, BLOOD    EKG  EKG Interpretation None       Radiology Dg Chest 2 View  Result Date: 02/05/2017 CLINICAL DATA:  26 year old female with pain under the left breast for the past 4 months. EXAM: CHEST  2 VIEW COMPARISON:  No priors. FINDINGS: Lung volumes are normal. No consolidative airspace disease. No pleural effusions. No pneumothorax. No pulmonary nodule or mass noted. Pulmonary vasculature and the cardiomediastinal silhouette are within normal limits. IMPRESSION: No radiographic evidence of acute cardiopulmonary disease. Electronically Signed   By: Trudie Reed M.D.   On: 02/05/2017 18:57    Procedures Procedures (including critical care time)  Medications Ordered in  ED Medications  cephALEXin (KEFLEX) capsule 500 mg (500 mg Oral Given 02/05/17 1937)     Initial Impression / Assessment and Plan / ED Course  I have reviewed the triage vital signs and the nursing notes.  Pertinent labs & imaging results that were available during my care of the patient were reviewed by me and considered in my medical decision making (see chart for details).     No acute abdomen on exam. Urinalysis shows evidence of infection. Will start cephalexin and Pyridium. This was discussed with the patient and her family.  Final Clinical Impressions(s) / ED Diagnoses   Final diagnoses:  Urinary tract infection without hematuria, site unspecified  Abdominal pain, unspecified abdominal location    New Prescriptions New Prescriptions   CEPHALEXIN (KEFLEX) 500 MG CAPSULE    Take 1 capsule (500 mg total) by mouth 4 (four) times daily.   PHENAZOPYRIDINE (PYRIDIUM) 200 MG TABLET    Take 1 tablet (200 mg total) by mouth 3 (three) times daily.     Donnetta Hutching, MD 02/05/17 1949

## 2017-02-05 NOTE — ED Triage Notes (Signed)
Patient present here with c/o abdominal pain started wed pain continue with no relief. Pt states she took ibuprofen at home and it did not help.

## 2017-02-05 NOTE — Discharge Instructions (Signed)
You have a urinary tract infection. Increase fluids. Prescription for antibiotic and medication for urinary pain. Your blood work was good.

## 2017-02-07 ENCOUNTER — Emergency Department (HOSPITAL_BASED_OUTPATIENT_CLINIC_OR_DEPARTMENT_OTHER)
Admission: EM | Admit: 2017-02-07 | Discharge: 2017-02-08 | Disposition: A | Discharge status: Home / Self Care | Payer: Self-pay | Attending: Emergency Medicine | Admitting: Emergency Medicine

## 2017-02-07 ENCOUNTER — Encounter (HOSPITAL_BASED_OUTPATIENT_CLINIC_OR_DEPARTMENT_OTHER): Payer: Self-pay | Admitting: *Deleted

## 2017-02-07 DIAGNOSIS — Z87891 Personal history of nicotine dependence: Secondary | ICD-10-CM | POA: Insufficient documentation

## 2017-02-07 DIAGNOSIS — Z8719 Personal history of other diseases of the digestive system: Secondary | ICD-10-CM

## 2017-02-07 DIAGNOSIS — Z79899 Other long term (current) drug therapy: Secondary | ICD-10-CM | POA: Insufficient documentation

## 2017-02-07 DIAGNOSIS — J45909 Unspecified asthma, uncomplicated: Secondary | ICD-10-CM | POA: Insufficient documentation

## 2017-02-07 DIAGNOSIS — R112 Nausea with vomiting, unspecified: Secondary | ICD-10-CM | POA: Insufficient documentation

## 2017-02-07 DIAGNOSIS — N3090 Cystitis, unspecified without hematuria: Secondary | ICD-10-CM | POA: Insufficient documentation

## 2017-02-07 MED ORDER — ONDANSETRON 4 MG PO TBDP
4.0000 mg | ORAL_TABLET | Freq: Once | ORAL | Status: AC
  Administered 2017-02-07: 4 mg via ORAL
  Filled 2017-02-07: qty 1

## 2017-02-07 NOTE — ED Triage Notes (Signed)
Pt [facility] The pain medicine they gave her makes her nauseated (she has vomited x1). Also, she is having pain in the lower abdomen.

## 2017-02-07 NOTE — ED Provider Notes (Signed)
[facility]   CSN: [id number] Arrival date & time: 02/07/17  2220     History   Chief Complaint Chief Complaint  Patient presents with  . Abdominal Pain    HPI Andrea Daniels is a 26 y.o. female with history of gastritis and asthma recently diagnosed with a UTI and started on Keflex medication who presents to the emergency department for episode of nausea and emesis after taking antibiotics. The patient states that today at 2 PM the patient took her antibiotics and Pyridium and started having nausea and had one episode of nonbilious, non-bloody emesis. This has not reoccurred. She has mild nausea now. Has been able to tolerate by mouth fluids since then. Patient states that she has continued achy lower suprapubic pain. Additionally she has dysuria and increased urinary frequency. Denies hematuria. States that she started her menstrual period today which has attributed to her nausea and lower abdominal pain as well. She denies fever, chills, vaginal pain, vaginal discharge, vaginal itching.   HPI  Past Medical History:  Diagnosis Date  . Asthma   . Gastritis   . Gastritis   . Ovarian cyst   . UTI (lower urinary tract infection)     Patient Active Problem List   Diagnosis Date Noted  . Acute respiratory failure (HCC) 10/12/2016  . Acute respiratory failure with hypoxia (HCC) 10/11/2016  . Hypokalemia 10/11/2016  . Lobar pneumonia (HCC)   . CAP (community acquired pneumonia) 06/03/2016  . Epigastric abdominal pain   . Tachycardia 06/02/2016  . Leukocytosis 06/02/2016  . Community acquired pneumonia of right lung 06/02/2016  . Asthma 06/02/2016  . Hypoxia   . Hypophosphatemia 03/20/2015  . Low TSH level 03/20/2015  . Metabolic acidosis 03/20/2015  . Microcytic anemia 03/20/2015  . Exacerbation of asthma 03/19/2015  . Elevated glucose 07/03/2014  . Asthma with exacerbation 10/22/2013  . Hives 07/11/2013  . Status asthmaticus 06/08/2013  .  Nausea vomiting and diarrhea 08/16/2012  . Allergic rhinitis 04/24/2012  . Preventative health care 08/14/2011    Past Surgical History:  Procedure Laterality Date  . NO PAST SURGERIES      OB History    Gravida Para Term Preterm AB Living   0             SAB TAB Ectopic Multiple Live Births                   Home Medications    Prior to Admission medications   Medication Sig Start Date End Date Taking? Authorizing Provider  albuterol (PROVENTIL HFA;VENTOLIN HFA) 108 (90 Base) MCG/ACT inhaler Inhale 2 puffs into the lungs every 4 (four) hours as needed for wheezing or shortness of breath. 10/25/16   Loletta SpecterGomez, Roger David, PA-C  budesonide-formoterol [facility](SYMBICORT) 80-4.5 MCG/ACT inhaler Inhale 2 puffs into the lungs 2 (two) times daily. 10/25/16   Loletta SpecterGomez, Roger David, PA-C  cephALEXin (KEFLEX) 500 MG capsule Take 1 capsule (500 mg total) by mouth 4 (four) times daily. 02/05/17   Donnetta Hutchingook, Brian, MD  ferrous sulfate 325 (65 FE) MG tablet Take 1 tablet (325 mg total) by mouth 3 (three) times daily with meals. 10/26/16   Loletta SpecterGomez, Roger David, PA-C  HYDROcodone-acetaminophen (NORCO/VICODIN) 5-325 MG tablet Take 1 tablet by mouth every 4 (four) hours as needed. 12/10/16   Elpidio AnisUpstill, Shari, PA-C  loratadine (CLARITIN) 10 MG tablet Take 1 tablet (10 mg total) by mouth daily. 10/25/16   Loletta SpecterGomez, Roger David, PA-C  montelukast (SINGULAIR) 10  MG tablet Take 1 tablet (10 mg total) by mouth at bedtime. 10/25/16   Loletta Specter, PA-C  omeprazole (PRILOSEC) 20 MG capsule Take 1 capsule (20 mg total) by mouth daily. 02/08/17   Patton Rabinovich, Elmer Sow, PA-C  ondansetron (ZOFRAN) 4 MG tablet Take 1 tablet (4 mg total) by mouth every 6 (six) hours. 02/08/17   Anil Havard, Elmer Sow, PA-C  phenazopyridine (PYRIDIUM) 200 MG tablet Take 1 tablet (200 mg total) by mouth 3 (three) times daily. 02/05/17   Donnetta Hutching, MD    Family History Family History  Problem Relation Age of Onset  . Arthritis Other   . Cancer Other        breast  cancer  . Hypertension Other   . Asthma Other   . Hyperlipidemia Other   . Hypertension Other   . Diabetes Other     Social History Social History  Substance Use Topics  . Smoking status: Former Smoker    Packs/day: 0.10    Types: Cigarettes  . Smokeless tobacco: Never Used  . Alcohol use Yes     Comment: Once a week      Allergies   Patient has no known allergies.   Review of Systems Review of Systems  All other systems reviewed and are negative.    Physical Exam Updated Vital Signs BP (!) 144/103   Pulse 75   Temp 98.7 F (37.1 C) (Oral)   Resp 16   Ht 4\' 11"  (1.499 m)   Wt 74.9 kg (165 lb 1.6 oz)   LMP 02/06/2017   SpO2 97%   BMI 33.35 kg/m   Physical Exam  Constitutional: She appears well-developed and well-nourished.  Nontoxic-appearing  HENT:  Head: Normocephalic and atraumatic.  Right Ear: External ear normal.  Left Ear: External ear normal.  Nose: Nose normal.  Mouth/Throat: Oropharynx is clear and moist.  Eyes: Pupils are equal, round, and reactive to light. Right eye exhibits no discharge. Left eye exhibits no discharge. No scleral icterus.  Neck: Neck supple.  Cardiovascular: Normal rate, regular rhythm and intact distal pulses.   No murmur heard. Pulses:      Radial pulses are 2+ on the right side, and 2+ on the left side.       Dorsalis pedis pulses are 2+ on the right side, and 2+ on the left side.       Posterior tibial pulses are 2+ on the right side, and 2+ on the left side.  No lower extremity swelling or edema. Calves symmetric in size bilaterally.  Pulmonary/Chest: Effort normal and breath sounds normal. She exhibits no tenderness.  Abdominal: Soft. Bowel sounds are normal. She exhibits no distension, no pulsatile liver and no pulsatile midline mass. There is tenderness in the suprapubic area. There is no rigidity, no rebound, no guarding, no CVA tenderness, no tenderness at McBurney's point and negative Murphy's sign.  Negative  McBurney's, Rovsing's, Psoas and Obturator signs.  Musculoskeletal: She exhibits no edema.  Lymphadenopathy:    She has no cervical adenopathy.  Neurological: She is alert.  Skin: Skin is warm and dry. No rash noted. She is not diaphoretic.  Psychiatric: She has a normal mood and affect.  Nursing note and vitals reviewed.    ED Treatments / Results  Labs (all labs ordered are listed, but only abnormal results are displayed) Labs Reviewed - No data to display  EKG  EKG Interpretation None       Radiology No results found.  Procedures Procedures (  including critical care time)  Medications Ordered in ED Medications  ondansetron (ZOFRAN-ODT) disintegrating tablet 4 mg (4 mg Oral Given 02/07/17 2356)     Initial Impression / Assessment and Plan / ED Course  I have reviewed the triage vital signs and the nursing notes.  Pertinent labs & imaging results that were available during my care of the patient were reviewed by me and considered in my medical decision making (see chart for details).     Patient here with recent UTI and nausea/vomiting after administration of abx and pyridium. Pain given nausea medication in the ED and PO challenged. Nausea resolved and able to handle PO.  She has been able to tolerate antibiotic prior to today. Will send the patient home with nausea medication. Advised the patient that urine cx was sent and if change in abx is warranted that she will receive a call. Advised her to take tylenol instead of NSAIDs due to here hx of gastritis. Patient has been taking NSAIDs at home. Will start on short course of PPI due to her hx. The evaluation does not show pathology that would require ongoing emergent intervention or inpatient treatment. I advised the patient to follow-up with PCP this week. I advised the patient to return to the emergency department with new or worsening symptoms or new concerns. Specific return precautions discussed. The patient verbalized  understanding and agreement with plan. All questions answered. No further questions at this time. The patient is hemodynamically stable, mentating appropriately and appears safe for discharge.  Final Clinical Impressions(s) / ED Diagnoses   Final diagnoses:  Cystitis  History of gastritis    New Prescriptions Discharge Medication List as of 02/08/2017 12:45 AM    START taking these medications   Details  omeprazole (PRILOSEC) 20 MG capsule Take 1 capsule (20 mg total) by mouth daily., Starting Wed 02/08/2017, Print    ondansetron (ZOFRAN) 4 MG tablet Take 1 tablet (4 mg total) by mouth every 6 (six) hours., Starting Wed 02/08/2017, Print         Jacinto Halim, PA-C 02/08/17 1134    Derwood Kaplan, MD 02/08/17 2303

## 2017-02-08 LAB — URINE CULTURE: Culture: >=100000 — AB

## 2017-02-08 MED ORDER — ONDANSETRON HCL 4 MG PO TABS: 4.0000 mg | ORAL_TABLET | Freq: Four times a day (QID) | ORAL | 0 refills | Status: DC

## 2017-02-08 MED ORDER — OMEPRAZOLE 20 MG PO CPDR: 20.0000 mg | DELAYED_RELEASE_CAPSULE | Freq: Every day | ORAL | 0 refills | Status: DC

## 2017-02-08 NOTE — Discharge Instructions (Signed)
Please take all of your antibiotics until finished!   You may develop abdominal discomfort or diarrhea from the antibiotic.  You may help offset this with probiotics which you can buy or get in yogurt. Do not eat  or take the probiotics until 2 hours after your antibiotic.  Please take your medication with food. I am providing you with a medication to take as needed for nausea. You can take tylenol as needed for pain. I am providing you with a PPI (medication for your stomach) due to your history of gastritis.  Your urine was sent for culture. If a change in antibiotics is warranted you will receive a call.  Follow up with your PCP this week. If you develop worsening or new concerning symptoms you can return to the emergency department for re-evaluation.  Your blood pressure (see below) was elevated during today's visit. Please discuss this with your PCP during your follow-up appointment to determine if a medication adjustment/addition is needed for this  Vitals:   02/07/17 2235  BP: (!) 169/108  Pulse: 76  Resp: 16  Temp: 98.7 F (37.1 C)  SpO2: 97%

## 2017-02-09 ENCOUNTER — Telehealth: Payer: Self-pay | Admitting: *Deleted

## 2017-02-09 NOTE — Progress Notes (Signed)
ED Antimicrobial Stewardship Positive Culture Follow Up   Andrea Daniels is an 26 y.o. female who presented to [facility] on 02/05/2017 with a chief complaint of No chief complaint on file.   Recent Results (from the past 720 hour(s))  Urine culture     Status: Abnormal   Collection Time: 02/05/17  6:00 PM  Result Value Ref Range Status   Specimen Description URINE, RANDOM  Final   Special Requests NONE  Final   Culture >=100,000 COLONIES/mL ENTEROBACTER AEROGENES (A)  Final   Report Status 02/08/2017 FINAL  Final   Organism ID, Bacteria ENTEROBACTER AEROGENES (A)  Final      Susceptibility   Enterobacter aerogenes - MIC*    CEFAZOLIN >=64 RESISTANT Resistant     CEFTRIAXONE <=1 SENSITIVE Sensitive     CIPROFLOXACIN <=0.25 SENSITIVE Sensitive     GENTAMICIN <=1 SENSITIVE Sensitive     IMIPENEM 2 SENSITIVE Sensitive     NITROFURANTOIN 32 SENSITIVE Sensitive     TRIMETH/SULFA <=20 SENSITIVE Sensitive     PIP/TAZO <=4 SENSITIVE Sensitive     * >=100,000 COLONIES/mL ENTEROBACTER AEROGENES    [x]  Treated with cephalexin, organism resistant to prescribed antimicrobial  New antibiotic prescription: Bactrim DS 1 tab PO BID x 3 days  ED Provider: [place] Russo, PA-C   Andrea Daniels, PharmD PGY1 Pharmacy Resident Pager: [phone]

## 2017-02-09 NOTE — Telephone Encounter (Signed)
Post ED Visit - Positive Culture Follow-up: Unsuccessful Patient Follow-up  Culture assessed and recommendations reviewed by:  []  Enzo BiNathan Batchelder, Pharm.D. []  Celedonio MiyamotoJeremy Frens, Pharm.D., BCPS AQ-ID []  Garvin FilaMike Maccia, Pharm.D., BCPS []  Georgina PillionElizabeth Martin, [address]Ph[address]D., BCPS []  CreteMinh Pham, [address]Pharm[address]D., BCPS, AAHIVP []  Estella HuskMichelle Turner, Pharm.D., BCPS, AAHIVP []  Andrea Pearlachel Rumbarger, PharmD, BCPS []  Casilda Carlsaylor Stone, PharmD, BCPS []  Pollyann SamplesAndy Johnston, PharmD, BCPS Verlan FriendsErin Deja, PharmD  Positive urine culture, reviewed by [place] Russo, PA-C  []  Patient discharged without antimicrobial prescription and treatment is now indicated [x]  Organism is resistant to prescribed ED discharge antimicrobial []  Patient with positive blood cultures   Unable to contact patient after 3 attempts, letter will be sent to address on file  Andrea PearlRobertson, Andrea Daniels 02/09/2017, 12:10 PM

## 2017-04-24 ENCOUNTER — Emergency Department (HOSPITAL_BASED_OUTPATIENT_CLINIC_OR_DEPARTMENT_OTHER)
Admission: EM | Admit: 2017-04-24 | Discharge: 2017-04-24 | Disposition: A | Discharge status: Home / Self Care | Payer: Self-pay | Attending: Emergency Medicine | Admitting: Emergency Medicine

## 2017-04-24 ENCOUNTER — Encounter (HOSPITAL_BASED_OUTPATIENT_CLINIC_OR_DEPARTMENT_OTHER): Payer: Self-pay | Admitting: *Deleted

## 2017-04-24 DIAGNOSIS — Z87891 Personal history of nicotine dependence: Secondary | ICD-10-CM | POA: Insufficient documentation

## 2017-04-24 DIAGNOSIS — B9789 Other viral agents as the cause of diseases classified elsewhere: Secondary | ICD-10-CM | POA: Insufficient documentation

## 2017-04-24 DIAGNOSIS — Z79899 Other long term (current) drug therapy: Secondary | ICD-10-CM | POA: Insufficient documentation

## 2017-04-24 DIAGNOSIS — J4552 Severe persistent asthma with status asthmaticus: Secondary | ICD-10-CM

## 2017-04-24 DIAGNOSIS — J45909 Unspecified asthma, uncomplicated: Secondary | ICD-10-CM | POA: Insufficient documentation

## 2017-04-24 DIAGNOSIS — J069 Acute upper respiratory infection, unspecified: Secondary | ICD-10-CM | POA: Insufficient documentation

## 2017-04-24 MED ORDER — LIDOCAINE VISCOUS 2 % MT SOLN: 15.0000 mL | OROMUCOSAL | 0 refills | Status: DC | PRN

## 2017-04-24 MED ORDER — MONTELUKAST SODIUM 10 MG PO TABS: 10.0000 mg | ORAL_TABLET | Freq: Every day | ORAL | 0 refills | Status: DC

## 2017-04-24 MED ORDER — BUDESONIDE-FORMOTEROL FUMARATE 80-4.5 MCG/ACT IN AERO: 2.0000 | INHALATION_SPRAY | Freq: Two times a day (BID) | RESPIRATORY_TRACT | 0 refills | Status: DC

## 2017-04-24 MED ORDER — DEXAMETHASONE SODIUM PHOSPHATE 10 MG/ML IJ SOLN
10.0000 mg | Freq: Once | INTRAMUSCULAR | Status: AC
  Administered 2017-04-24: 10 mg via INTRAMUSCULAR
  Filled 2017-04-24: qty 1

## 2017-04-24 MED ORDER — LORATADINE-PSEUDOEPHEDRINE ER 5-120 MG PO TB12: 1.0000 | ORAL_TABLET | Freq: Two times a day (BID) | ORAL | 0 refills | Status: DC

## 2017-04-24 MED ORDER — PROMETHAZINE-DM 6.25-15 MG/5ML PO SYRP: 5.0000 mL | ORAL_SOLUTION | Freq: Four times a day (QID) | ORAL | 0 refills | Status: DC | PRN

## 2017-04-24 MED ORDER — ONDANSETRON 4 MG PO TBDP
4.0000 mg | ORAL_TABLET | Freq: Once | ORAL | Status: AC
  Administered 2017-04-24: 4 mg via ORAL
  Filled 2017-04-24: qty 1

## 2017-04-24 MED ORDER — ONDANSETRON 4 MG PO TBDP: 4.0000 mg | ORAL_TABLET | Freq: Three times a day (TID) | ORAL | 0 refills | Status: DC | PRN

## 2017-04-24 NOTE — ED Triage Notes (Signed)
Pt reports cough and cold symptoms since yesterday. Denies fever, v/d. Reports mild nausea.

## 2017-04-24 NOTE — ED Provider Notes (Signed)
[facility] EMERGENCY DEPARTMENT Provider Note   CSN: [id number] Arrival date & time: 04/24/17  0908     History   Chief Complaint Chief Complaint  Patient presents with  . Cough  . Nasal Congestion    HPI Andrea Daniels is a 26 y.o. female  The history is provided by the patient. No language interpreter was used.  URI   This is a new problem. The current episode started yesterday. The problem has been gradually worsening. There has been no fever. Associated symptoms include nausea, congestion, rhinorrhea, sneezing, sore throat and cough. Pertinent negatives include no chest pain, no abdominal pain, no diarrhea, no vomiting, no dysuria, no ear pain, no headaches, no plugged ear sensation, no sinus pain, no swollen glands, no joint pain, no joint swelling, no neck pain, no rash and no wheezing. Treatments tried: Nyquil. The treatment provided mild relief.   She has not had her flu shot this year.  Past Medical History:  Diagnosis Date  . Asthma   . Gastritis   . Gastritis   . Ovarian cyst   . UTI (lower urinary tract infection)     Patient Active Problem List   Diagnosis Date Noted  . Acute respiratory failure (HCC) 10/12/2016  . Acute respiratory failure with hypoxia (HCC) 10/11/2016  . Hypokalemia 10/11/2016  . Lobar pneumonia (HCC)   . CAP (community acquired pneumonia) 06/03/2016  . Epigastric abdominal pain   . Tachycardia 06/02/2016  . Leukocytosis 06/02/2016  . Community acquired pneumonia of right lung 06/02/2016  . Asthma 06/02/2016  . Hypoxia   . Hypophosphatemia 03/20/2015  . Low TSH level 03/20/2015  . Metabolic acidosis 03/20/2015  . Microcytic anemia 03/20/2015  . Exacerbation of asthma 03/19/2015  . Elevated glucose 07/03/2014  . Asthma with exacerbation 10/22/2013  . Hives 07/11/2013  . Status asthmaticus 06/08/2013  . Nausea vomiting and diarrhea 08/16/2012  . Allergic rhinitis 04/24/2012  . Preventative health care 08/14/2011     Past Surgical History:  Procedure Laterality Date  . NO PAST SURGERIES      OB History    Gravida Para Term Preterm AB Living   0             SAB TAB Ectopic Multiple Live Births                   Home Medications    Prior to Admission medications   Medication Sig Start Date End Date Taking? Authorizing Provider  albuterol (PROVENTIL HFA;VENTOLIN HFA) 108 (90 Base) MCG/ACT inhaler Inhale 2 puffs into the lungs every 4 (four) hours as needed for wheezing or shortness of breath. 10/25/16  Yes Loletta Specter, PA-C  budesonide-formoterol [facility]) 80-4.5 MCG/ACT inhaler Inhale 2 puffs into the lungs 2 (two) times daily. 10/25/16  Yes Loletta Specter, PA-C  lidocaine (XYLOCAINE) 2 % solution Use as directed 15 mLs in the mouth or throat every 3 (three) hours as needed for mouth pain. 04/24/17   Mrk Buzby A, PA-C  loratadine-pseudoephedrine (CLARITIN-D 12 HOUR) 5-120 MG tablet Take 1 tablet by mouth 2 (two) times daily. 04/24/17   Madhuri Vacca A, PA-C  ondansetron (ZOFRAN ODT) 4 MG disintegrating tablet Take 1 tablet (4 mg total) by mouth every 8 (eight) hours as needed for nausea or vomiting. 04/24/17   Jalik Gellatly A, PA-C  promethazine-dextromethorphan (PROMETHAZINE-DM) 6.25-15 MG/5ML syrup Take 5 mLs by mouth 4 (four) times daily as needed for cough. 04/24/17   Tyesha Joffe A, PA-C  Family History Family History  Problem Relation Age of Onset  . Arthritis Other   . Cancer Other        breast cancer  . Hypertension Other   . Asthma Other   . Hyperlipidemia Other   . Hypertension Other   . Diabetes Other     Social History Social History  Substance Use Topics  . Smoking status: Former Smoker    Packs/day: 0.10    Types: Cigarettes  . Smokeless tobacco: Never Used  . Alcohol use Yes     Comment: Once a week      Allergies   Patient has no known allergies.   Review of Systems Review of Systems  Constitutional: Negative for chills and fever.   HENT: Positive for congestion, rhinorrhea, sneezing and sore throat. Negative for ear pain and sinus pain.   Respiratory: Positive for cough. Negative for wheezing.   Cardiovascular: Negative for chest pain.  Gastrointestinal: Positive for nausea. Negative for abdominal pain, diarrhea and vomiting.  Genitourinary: Negative for dysuria.  Musculoskeletal: Negative for back pain, joint pain, myalgias and neck pain.  Skin: Negative for rash.  Allergic/Immunologic: Negative for immunocompromised state.  Neurological: Negative for headaches.     Physical Exam Updated Vital Signs BP (!) 140/100 (BP Location: Right Arm)   Pulse 82   Temp 97.9 F (36.6 C) (Oral)   Resp 16   Ht 4\' 11"  (1.499 m)   Wt 73.5 kg (162 lb)   LMP 04/08/2017 (Exact Date)   SpO2 100%   BMI 32.72 kg/m   Physical Exam  Constitutional: No distress.  Obese female.  HENT:  Head: Normocephalic.  Right Ear: Hearing and tympanic membrane normal.  Left Ear: Hearing and tympanic membrane normal.  Nose: Mucosal edema and rhinorrhea present. Right sinus exhibits no maxillary sinus tenderness and no frontal sinus tenderness. Left sinus exhibits no maxillary sinus tenderness and no frontal sinus tenderness.  Mouth/Throat: Uvula is midline. No trismus in the jaw. Posterior oropharyngeal erythema present. No oropharyngeal exudate or posterior oropharyngeal edema. No tonsillar exudate.  No trismus  Eyes: Conjunctivae are normal.  Neck: Normal range of motion. Neck supple.  No meningeal signs.  Cardiovascular: Normal rate, regular rhythm and normal heart sounds.  Exam reveals no gallop and no friction rub.   No murmur heard. Pulmonary/Chest: Effort normal. No respiratory distress. She has no wheezes. She has no rales.  Abdominal: Soft. Bowel sounds are normal. She exhibits no distension and no mass. There is no tenderness. There is no rebound and no guarding.  Neurological: She is alert.  Skin: Skin is warm. No rash noted.   Psychiatric: Her behavior is normal.  Nursing note and vitals reviewed.   ED Treatments / Results  Labs (all labs ordered are listed, but only abnormal results are displayed) Labs Reviewed - No data to display  EKG  EKG Interpretation None       Radiology No results found.  Procedures Procedures (including critical care time)  Medications Ordered in ED Medications  ondansetron (ZOFRAN-ODT) disintegrating tablet 4 mg (4 mg Oral Given 04/24/17 1047)  dexamethasone (DECADRON) injection 10 mg (10 mg Intramuscular Given 04/24/17 1048)     Initial Impression / Assessment and Plan / ED Course  I have reviewed the triage vital signs and the nursing notes.  Pertinent labs & imaging results that were available during my care of the patient were reviewed by me and considered in my medical decision making (see chart for details).  26 year old female presenting with sore throat, nasal congestion, nonproductive cough that began last night.  On exam, lungs are clear to auscultation. Afebrile. Non-tachy. Normotensive. At this time, I do not feel a CXR is indicated. Patients symptoms are consistent with URI, likely viral etiology. Discussed that antibiotics are not indicated for viral infections. Pt will be discharged with symptomatic treatment.  Verbalizes understanding and is agreeable with plan.  Strict return precautions given. pt is hemodynamically stable & in NAD prior to dc.   Final Clinical Impressions(s) / ED Diagnoses   Final diagnoses:  Viral URI with cough    New Prescriptions New Prescriptions   LIDOCAINE (XYLOCAINE) 2 % SOLUTION    Use as directed 15 mLs in the mouth or throat every 3 (three) hours as needed for mouth pain.   LORATADINE-PSEUDOEPHEDRINE (CLARITIN-D 12 HOUR) 5-120 MG TABLET    Take 1 tablet by mouth 2 (two) times daily.   ONDANSETRON (ZOFRAN ODT) 4 MG DISINTEGRATING TABLET    Take 1 tablet (4 mg total) by mouth every 8 (eight) hours as needed for  nausea or vomiting.   PROMETHAZINE-DEXTROMETHORPHAN (PROMETHAZINE-DM) 6.25-15 MG/5ML SYRUP    Take 5 mLs by mouth 4 (four) times daily as needed for cough.     Frederik Pear A, PA-C 04/24/17 1054    Alvira Monday, MD 04/27/17 1226

## 2017-04-24 NOTE — Discharge Instructions (Signed)
Take of viscous lidocaine once every 3 hours as needed for sore throat. Take one tablet of Claritin-D every 12 hours (if it's the 12-hour formulation) or once daily (if it's the 24-hour formulation) cope with your nasal congestion. Take 5 mLs of promethazine-dextromethorphan for cough once every 6 hours as needed.   Viruses can take 7-10 days to fully resolve.  Antibiotics are ineffective if you have a virus so we can only treat your symptoms.  You can take 600 mg of ibuprofen once every 6-8 hours with food or 650 mg of Tylenol every 6 hours to help with pain and inflammation.  If you develop new or worsening symptoms, including feeling as if your throat is closing and you are unable to get any air, a fever that does not improve a Tylenol, chest pain or tightness, vomiting and diarrhea we were unable to keep down any liquids or food, or other new concerning symptoms, please return to the emergency department for reevaluation.

## 2017-05-22 ENCOUNTER — Telehealth: Payer: Self-pay | Admitting: Emergency Medicine

## 2017-05-22 NOTE — Telephone Encounter (Signed)
Lost to followup 

## 2017-06-01 ENCOUNTER — Emergency Department (HOSPITAL_COMMUNITY)
Admission: EM | Admit: 2017-06-01 | Discharge: 2017-06-01 | Disposition: A | Discharge status: Home / Self Care | Payer: Self-pay | Attending: Emergency Medicine | Admitting: Emergency Medicine

## 2017-06-01 ENCOUNTER — Emergency Department (HOSPITAL_COMMUNITY): Payer: Self-pay

## 2017-06-01 ENCOUNTER — Other Ambulatory Visit: Payer: Self-pay

## 2017-06-01 ENCOUNTER — Encounter (HOSPITAL_COMMUNITY): Payer: Self-pay | Admitting: Emergency Medicine

## 2017-06-01 DIAGNOSIS — J4521 Mild intermittent asthma with (acute) exacerbation: Secondary | ICD-10-CM

## 2017-06-01 DIAGNOSIS — R0602 Shortness of breath: Secondary | ICD-10-CM

## 2017-06-01 DIAGNOSIS — J45901 Unspecified asthma with (acute) exacerbation: Secondary | ICD-10-CM | POA: Insufficient documentation

## 2017-06-01 DIAGNOSIS — Z87891 Personal history of nicotine dependence: Secondary | ICD-10-CM | POA: Insufficient documentation

## 2017-06-01 DIAGNOSIS — J189 Pneumonia, unspecified organism: Secondary | ICD-10-CM

## 2017-06-01 DIAGNOSIS — J181 Lobar pneumonia, unspecified organism: Secondary | ICD-10-CM | POA: Insufficient documentation

## 2017-06-01 DIAGNOSIS — Z79899 Other long term (current) drug therapy: Secondary | ICD-10-CM | POA: Insufficient documentation

## 2017-06-01 MED ORDER — AMOXICILLIN 500 MG PO CAPS: 1000.0000 mg | ORAL_CAPSULE | Freq: Two times a day (BID) | ORAL | 0 refills | Status: DC

## 2017-06-01 MED ORDER — ALBUTEROL SULFATE HFA 108 (90 BASE) MCG/ACT IN AERS
2.0000 | INHALATION_SPRAY | Freq: Once | RESPIRATORY_TRACT | Status: AC
  Administered 2017-06-01: 2 via RESPIRATORY_TRACT
  Filled 2017-06-01: qty 6.7

## 2017-06-01 MED ORDER — BUDESONIDE-FORMOTEROL FUMARATE 80-4.5 MCG/ACT IN AERO: 2.0000 | INHALATION_SPRAY | Freq: Every day | RESPIRATORY_TRACT | 0 refills | Status: DC

## 2017-06-01 MED ORDER — ONDANSETRON 4 MG PO TBDP
4.0000 mg | ORAL_TABLET | Freq: Once | ORAL | Status: AC
  Administered 2017-06-01: 4 mg via ORAL
  Filled 2017-06-01: qty 1

## 2017-06-01 MED ORDER — AZITHROMYCIN 250 MG PO TABS: 250.0000 mg | ORAL_TABLET | Freq: Every day | ORAL | 0 refills | Status: DC

## 2017-06-01 MED ORDER — AZITHROMYCIN 250 MG PO TABS
500.0000 mg | ORAL_TABLET | Freq: Once | ORAL | Status: AC
  Administered 2017-06-01: 500 mg via ORAL
  Filled 2017-06-01: qty 2

## 2017-06-01 MED ORDER — PREDNISONE 20 MG PO TABS: 40.0000 mg | ORAL_TABLET | Freq: Every day | ORAL | 0 refills | Status: DC

## 2017-06-01 NOTE — ED Provider Notes (Signed)
[facility]-EMERGENCY DEPT Provider Note   CSN: [id number] Arrival date & time: 06/01/17  0534     History   Chief Complaint Chief Complaint  Patient presents with  . Shortness of Breath    HPI Andrea Daniels is a 26 y.o. female.  The history is provided by the patient and medical records. No language interpreter was used.  Shortness of Breath  Associated symptoms include sore throat and cough. Pertinent negatives include no chest pain, no vomiting, no abdominal pain and no rash.   Andrea Daniels is a 26 y.o. female  with a PMH of asthma who presents to the Emergency Department complaining of shortness of breath x 2 days. Patient states that she has been out of her daily asthma medications over this same time frame and attributes symptoms to this. She has rx for Symbicort inhaler to start, but was not able to get it filled due to cost. Associated symptoms include congestion, rhinorrhea, wheezing, sore throat, nausea and loose stools. + sick contacts at work. She has tried promethazine cough syrup, dayquil, nyquil and cough drops which have provided some relief. EMS gave 10mg  albuterol, 0.5 mg atrovent, 125 mg solumedrol and 2g of mag. Not a smoker.  Past Medical History:  Diagnosis Date  . Asthma   . Gastritis   . Gastritis   . Ovarian cyst   . UTI (lower urinary tract infection)     Patient Active Problem List   Diagnosis Date Noted  . Acute respiratory failure (HCC) 10/12/2016  . Acute respiratory failure with hypoxia (HCC) 10/11/2016  . Hypokalemia 10/11/2016  . Lobar pneumonia (HCC)   . CAP (community acquired pneumonia) 06/03/2016  . Epigastric abdominal pain   . Tachycardia 06/02/2016  . Leukocytosis 06/02/2016  . Community acquired pneumonia of right lung 06/02/2016  . Asthma 06/02/2016  . Hypoxia   . Hypophosphatemia 03/20/2015  . Low TSH level 03/20/2015  . Metabolic acidosis 03/20/2015  . Microcytic anemia 03/20/2015  . Exacerbation of  asthma 03/19/2015  . Elevated glucose 07/03/2014  . Asthma with exacerbation 10/22/2013  . Hives 07/11/2013  . Status asthmaticus 06/08/2013  . Nausea vomiting and diarrhea 08/16/2012  . Allergic rhinitis 04/24/2012  . Preventative health care 08/14/2011    Past Surgical History:  Procedure Laterality Date  . NO PAST SURGERIES      OB History    Gravida Para Term Preterm AB Living   0             SAB TAB Ectopic Multiple Live Births                   Home Medications    Prior to Admission medications   Medication Sig Start Date End Date Taking? Authorizing Provider  albuterol (PROVENTIL HFA;VENTOLIN HFA) 108 (90 Base) MCG/ACT inhaler Inhale 2 puffs into the lungs every 4 (four) hours as needed for wheezing or shortness of breath. 10/25/16  Yes Loletta Specter, PA-C  montelukast (SINGULAIR) 10 MG tablet Take 1 tablet (10 mg total) by mouth at bedtime. 04/24/17  Yes McDonald, Mia A, PA-C  promethazine-dextromethorphan (PROMETHAZINE-DM) 6.25-15 MG/5ML syrup Take 5 mLs by mouth 4 (four) times daily as needed for cough. 04/24/17  Yes McDonald, Mia A, PA-C  Pseudoeph-Doxylamine-DM-APAP 30-6.25-15-325 MG CAPS Take 2 capsules by mouth at bedtime as needed (cold,sleep).   Yes [provider]  Pseudoephedrine-APAP-DM 30-325-15 MG CAPS Take 2 capsules by mouth every 8 (eight) hours as needed (cold).  Yes [provider]  amoxicillin (AMOXIL) 500 MG capsule Take 2 capsules (1,000 mg total) by mouth 2 (two) times daily. 06/01/17   Wolfgang Finigan, Chase PicketJaime Pilcher, PA-C  azithromycin (ZITHROMAX) 250 MG tablet Take 1 tablet (250 mg total) by mouth daily. Take 1 tablet by mouth daily beginning tomorrow. You received your first dose in the ER today. 06/01/17   Mariafernanda Hendricksen, Chase PicketJaime Pilcher, PA-C  budesonide-formoterol (SYMBICORT) 80-4.5 MCG/ACT inhaler Inhale 2 puffs into the lungs daily. 06/01/17   Barri Neidlinger, Chase PicketJaime Pilcher, PA-C  lidocaine (XYLOCAINE) 2 % solution Use as directed 15 mLs in the mouth or  throat every 3 (three) hours as needed for mouth pain. Patient not taking: Reported on 06/01/2017 04/24/17   McDonald, Mia A, PA-C  loratadine-pseudoephedrine (CLARITIN-D 12 HOUR) 5-120 MG tablet Take 1 tablet by mouth 2 (two) times daily. Patient not taking: Reported on 06/01/2017 04/24/17   McDonald, Mia A, PA-C  ondansetron (ZOFRAN ODT) 4 MG disintegrating tablet Take 1 tablet (4 mg total) by mouth every 8 (eight) hours as needed for nausea or vomiting. Patient not taking: Reported on 06/01/2017 04/24/17   McDonald, Pedro EarlsMia A, PA-C  predniSONE (DELTASONE) 20 MG tablet Take 2 tablets (40 mg total) by mouth daily. 06/01/17   Lecia Esperanza, Chase PicketJaime Pilcher, PA-C    Family History Family History  Problem Relation Age of Onset  . Arthritis Other   . Cancer Other        breast cancer  . Hypertension Other   . Asthma Other   . Hyperlipidemia Other   . Hypertension Other   . Diabetes Other     Social History Social History   Tobacco Use  . Smoking status: Former Smoker    Packs/day: 0.10    Types: Cigarettes  . Smokeless tobacco: Never Used  Substance Use Topics  . Alcohol use: Yes    Comment: Once a week   . Drug use: Yes    Types: Marijuana     Allergies   Patient has no known allergies.   Review of Systems Review of Systems  HENT: Positive for congestion and sore throat.   Respiratory: Positive for cough, chest tightness and shortness of breath.   Cardiovascular: Negative for chest pain.  Gastrointestinal: Positive for diarrhea and nausea. Negative for abdominal pain and vomiting.  Musculoskeletal: Negative for arthralgias and myalgias.  Skin: Negative for rash.  All other systems reviewed and are negative.    Physical Exam Updated Vital Signs BP (!) 141/94   Pulse 100   Temp 97.8 F (36.6 C) (Oral)   Resp (!) 21   LMP 05/13/2017   SpO2 99%   Physical Exam  Constitutional: She is oriented to person, place, and time. She appears well-developed and well-nourished. No  distress.  HENT:  Head: Normocephalic and atraumatic.  Mouth/Throat: Oropharynx is clear and moist.  Cardiovascular: Regular rhythm and normal heart sounds.  No murmur heard. Pulmonary/Chest: Effort normal and breath sounds normal. No respiratory distress.  Faint expiratory wheezing. Speaking in full sentences without difficulty.   Abdominal: Soft. She exhibits no distension.  No abdominal tenderness.  Musculoskeletal: She exhibits no edema.  Neurological: She is alert and oriented to person, place, and time.  Skin: Skin is warm and dry.  Nursing note and vitals reviewed.    ED Treatments / Results  Labs (all labs ordered are listed, but only abnormal results are displayed) Labs Reviewed - No data to display  EKG  EKG Interpretation None  Radiology Dg Chest 2 View  Result Date: 06/01/2017 CLINICAL DATA:  Worsening cough.  Shortness of breath.  Fevers. EXAM: CHEST  2 VIEW COMPARISON:  02/05/2017. FINDINGS: Mediastinum hilar structures normal. Mild left base subsegmental atelectasis/infiltrate. No pleural effusion or pneumothorax. IMPRESSION: Mild left base subsegmental atelectasis/infiltrate. Electronically Signed   By: Maisie Fushomas  Register   On: 06/01/2017 07:15    Procedures Procedures (including critical care time)  Medications Ordered in ED Medications  ondansetron (ZOFRAN-ODT) disintegrating tablet 4 mg (4 mg Oral Given 06/01/17 0703)  albuterol (PROVENTIL HFA;VENTOLIN HFA) 108 (90 Base) MCG/ACT inhaler 2 puff (2 puffs Inhalation Given 06/01/17 0703)  azithromycin (ZITHROMAX) tablet 500 mg (500 mg Oral Given 06/01/17 0739)     Initial Impression / Assessment and Plan / ED Course  I have reviewed the triage vital signs and the nursing notes.  Pertinent labs & imaging results that were available during my care of the patient were reviewed by me and considered in my medical decision making (see chart for details).    Carey Bullocksia L Hairston is a 26 y.o. female who presents  to ED for shortness of breath and wheezing c/w asthma exacerbation. She also has been experiencing congestion, nausea and diarrhea likely due to viral illness. Patient received albuterol, atrovent, solumedrol and magnesium by EMS. Upon my evaluation,  Patient is 100% o2 on RA, speaking in full sentences comfortably and without difficulty with faint expiratory wheezing bilaterally. CXR shows possible left lower lobe PNA vs. Infiltrate. Given clinical picture, favor PNA and will treat with amoxil and azithro. Rx for steroid burst and refill of inhaler also provided. Patient feels comfortable with discharge to home, but spoke at length about return precautions. PCP follow up encouraged. All questions answered.     Final Clinical Impressions(s) / ED Diagnoses   Final diagnoses:  Shortness of breath  Exacerbation of intermittent asthma, unspecified asthma severity  Community acquired pneumonia of left lower lobe of lung [facility](HCC)    ED Discharge Orders        Ordered    amoxicillin (AMOXIL) 500 MG capsule  2 times daily     06/01/17 0801    azithromycin (ZITHROMAX) 250 MG tablet  Daily     06/01/17 0801    budesonide-formoterol (SYMBICORT) 80-4.5 MCG/ACT inhaler  Daily     06/01/17 0801    predniSONE (DELTASONE) 20 MG tablet  Daily     06/01/17 0801       Lillan Mccreadie, Chase PicketJaime Pilcher, PA-C 06/01/17 [id number]    Azalia Bilisampos, Kevin, MD 06/01/17 [phone]

## 2017-06-01 NOTE — Discharge Instructions (Signed)
It was my pleasure taking care of you today!   Please take all of your antibiotics until finished! Take prednisone daily beginning tomorrow morning.  Use albuterol inhaler as needed. I have refilled your other inhaler.   Please follow up with your primary care provider for recheck. Call today to schedule follow up.   Return to ER for new or worsening symptoms, any additional concerns.

## 2017-06-01 NOTE — ED Triage Notes (Signed)
Pt coming from with shortness of breath. Pt reports she has been out of her asthma medication for 2 days and has had a cough for month. Pt also reports she has had a fever off and on for a few weeks. Pt has received 10mg  albuterol, 0.5 of atrovent, 125mg  solumedrol, and 2g of mag from EMS prior to arrival via 20g IV in L AC. Pt A&O x4.

## 2017-06-01 NOTE — ED Notes (Signed)
LAC #20 IV removed prior to discharge.

## 2017-07-11 ENCOUNTER — Emergency Department (HOSPITAL_COMMUNITY)
Admission: EM | Admit: 2017-07-11 | Discharge: 2017-07-11 | Disposition: A | Discharge status: Home / Self Care | Payer: Managed Care, Other (non HMO) | Attending: Emergency Medicine | Admitting: Emergency Medicine

## 2017-07-11 ENCOUNTER — Other Ambulatory Visit: Payer: Self-pay

## 2017-07-11 ENCOUNTER — Encounter (HOSPITAL_COMMUNITY): Payer: Self-pay

## 2017-07-11 DIAGNOSIS — Z79899 Other long term (current) drug therapy: Secondary | ICD-10-CM | POA: Diagnosis not present

## 2017-07-11 DIAGNOSIS — R112 Nausea with vomiting, unspecified: Secondary | ICD-10-CM | POA: Diagnosis not present

## 2017-07-11 DIAGNOSIS — R197 Diarrhea, unspecified: Secondary | ICD-10-CM | POA: Insufficient documentation

## 2017-07-11 DIAGNOSIS — Z87891 Personal history of nicotine dependence: Secondary | ICD-10-CM | POA: Insufficient documentation

## 2017-07-11 DIAGNOSIS — J45909 Unspecified asthma, uncomplicated: Secondary | ICD-10-CM | POA: Diagnosis not present

## 2017-07-11 LAB — COMPREHENSIVE METABOLIC PANEL
ALT: 37 U/L (ref 14–54)
AST: 34 U/L (ref 15–41)
Albumin: 4.1 g/dL (ref 3.5–5.0)
Alkaline Phosphatase: 60 U/L (ref 38–126)
Anion gap: 5 (ref 5–15)
BUN: 6 mg/dL (ref 6–20)
CO2: 26 mmol/L (ref 22–32)
Calcium: 8.7 mg/dL — ABNORMAL LOW (ref 8.9–10.3)
Chloride: 105 mmol/L (ref 101–111)
Creatinine, Ser: 0.73 mg/dL (ref 0.44–1.00)
GFR calc Af Amer: >60 mL/min (ref >60)
GFR calc non Af Amer: >60 mL/min (ref >60)
Glucose, Bld: 112 mg/dL — ABNORMAL HIGH (ref 65–99)
Potassium: 3.8 mmol/L (ref 3.5–5.1)
Sodium: 136 mmol/L (ref 135–145)
Total Bilirubin: 0.6 mg/dL (ref 0.3–1.2)
Total Protein: 7.7 g/dL (ref 6.5–8.1)

## 2017-07-11 LAB — I-STAT BETA HCG BLOOD, ED (MC, WL, AP ONLY): I-stat hCG, quantitative: <5 m[IU]/mL (ref <5)

## 2017-07-11 LAB — CBC
HCT: 39.8 % (ref 36.0–46.0)
Hemoglobin: 13.5 g/dL (ref 12.0–15.0)
MCH: 27.3 pg (ref 26.0–34.0)
MCHC: 33.9 g/dL (ref 30.0–36.0)
MCV: 80.6 fL (ref 78.0–100.0)
Platelets: 370 10*3/uL (ref 150–400)
RBC: 4.94 MIL/uL (ref 3.87–5.11)
RDW: 13 % (ref 11.5–15.5)
WBC: 7.1 10*3/uL (ref 4.0–10.5)

## 2017-07-11 LAB — URINALYSIS, ROUTINE W REFLEX MICROSCOPIC
Bilirubin Urine: NEGATIVE
Glucose, UA: NEGATIVE mg/dL
Ketones, ur: NEGATIVE mg/dL
Leukocytes, UA: NEGATIVE
Nitrite: NEGATIVE
Protein, ur: NEGATIVE mg/dL
Specific Gravity, Urine: 1.011 (ref 1.005–1.030)
pH: 6 (ref 5.0–8.0)

## 2017-07-11 LAB — LIPASE, BLOOD: Lipase: 34 U/L (ref 11–51)

## 2017-07-11 MED ORDER — ONDANSETRON 4 MG PO TBDP: 4.0000 mg | ORAL_TABLET | Freq: Three times a day (TID) | ORAL | 0 refills | Status: DC | PRN

## 2017-07-11 MED ORDER — ONDANSETRON 4 MG PO TBDP
4.0000 mg | ORAL_TABLET | Freq: Once | ORAL | Status: AC
  Administered 2017-07-11: 4 mg via ORAL
  Filled 2017-07-11: qty 1

## 2017-07-11 MED ORDER — ONDANSETRON 4 MG PO TBDP
4.0000 mg | ORAL_TABLET | Freq: Once | ORAL | Status: AC | PRN
  Administered 2017-07-11: 4 mg via ORAL
  Filled 2017-07-11: qty 1

## 2017-07-11 NOTE — ED Notes (Signed)
Provided patient Sprite with permission from provider.  

## 2017-07-11 NOTE — ED Provider Notes (Signed)
[facility]-EMERGENCY DEPT Provider Note   CSN: [id number] Arrival date & time: 07/11/17  1025     History   Chief Complaint Chief Complaint  Patient presents with  . Emesis  . Diarrhea    HPI Andrea Daniels is a 27 y.o. female with history of asthma and gastritis presents for evaluation of sudden onset, nausea, vomiting x 7, nonbloody diarrhea x7 since this morning. Associated with diffuse lower abdominal and periumbilical abdominal pain, that has since resolved and rhinorrhea. Denies sick contacts with similar symptoms. But friend has influenza. Denies exposure to suspicious foods or recent travel. No fevers, chills, dysuria, hematuria, abnormal vaginal discharge, melena, hematochezia. Menses started today and reports expected vaginal spotting consistent with this. No abdominal surgeries.  HPI  Past Medical History:  Diagnosis Date  . Asthma   . Gastritis   . Gastritis   . Ovarian cyst   . UTI (lower urinary tract infection)     Patient Active Problem List   Diagnosis Date Noted  . Acute respiratory failure (HCC) 10/12/2016  . Acute respiratory failure with hypoxia (HCC) 10/11/2016  . Hypokalemia 10/11/2016  . Lobar pneumonia (HCC)   . CAP (community acquired pneumonia) 06/03/2016  . Epigastric abdominal pain   . Tachycardia 06/02/2016  . Leukocytosis 06/02/2016  . Community acquired pneumonia of right lung 06/02/2016  . Asthma 06/02/2016  . Hypoxia   . Hypophosphatemia 03/20/2015  . Low TSH level 03/20/2015  . Metabolic acidosis 03/20/2015  . Microcytic anemia 03/20/2015  . Exacerbation of asthma 03/19/2015  . Elevated glucose 07/03/2014  . Asthma with exacerbation 10/22/2013  . Hives 07/11/2013  . Status asthmaticus 06/08/2013  . Nausea vomiting and diarrhea 08/16/2012  . Allergic rhinitis 04/24/2012  . Preventative health care 08/14/2011    Past Surgical History:  Procedure Laterality Date  . NO PAST SURGERIES      OB History    Gravida Para Term Preterm AB Living   0             SAB TAB Ectopic Multiple Live Births                   Home Medications    Prior to Admission medications   Medication Sig Start Date End Date Taking? Authorizing Provider  albuterol (PROVENTIL HFA;VENTOLIN HFA) 108 (90 Base) MCG/ACT inhaler Inhale 2 puffs into the lungs every 4 (four) hours as needed for wheezing or shortness of breath. 10/25/16   Loletta Specter, PA-C  amoxicillin (AMOXIL) 500 MG capsule Take 2 capsules (1,000 mg total) by mouth 2 (two) times daily. 06/01/17   Ward, Chase Picket, PA-C  azithromycin (ZITHROMAX) 250 MG tablet Take 1 tablet (250 mg total) by mouth daily. Take 1 tablet by mouth daily beginning tomorrow. You received your first dose in the ER today. 06/01/17   Ward, Chase Picket, PA-C  budesonide-formoterol (SYMBICORT) 80-4.5 MCG/ACT inhaler Inhale 2 puffs into the lungs daily. 06/01/17   Ward, Chase Picket, PA-C  lidocaine (XYLOCAINE) 2 % solution Use as directed 15 mLs in the mouth or throat every 3 (three) hours as needed for mouth pain. Patient not taking: Reported on 06/01/2017 04/24/17   McDonald, Mia A, PA-C  loratadine-pseudoephedrine (CLARITIN-D 12 HOUR) 5-120 MG tablet Take 1 tablet by mouth 2 (two) times daily. Patient not taking: Reported on 06/01/2017 04/24/17   McDonald, Mia A, PA-C  montelukast (SINGULAIR) 10 MG tablet Take 1 tablet (10 mg total) by mouth at bedtime. 04/24/17  McDonald, Mia A, PA-C  ondansetron (ZOFRAN ODT) 4 MG disintegrating tablet Take 1 tablet (4 mg total) by mouth every 8 (eight) hours as needed for nausea or vomiting. 07/11/17   Liberty Handy, PA-C  predniSONE (DELTASONE) 20 MG tablet Take 2 tablets (40 mg total) by mouth daily. 06/01/17   Ward, Chase Picket, PA-C  promethazine-dextromethorphan (PROMETHAZINE-DM) 6.25-15 MG/5ML syrup Take 5 mLs by mouth 4 (four) times daily as needed for cough. 04/24/17   McDonald, Mia A, PA-C  Pseudoeph-Doxylamine-DM-APAP 30-6.25-15-325  MG CAPS Take 2 capsules by mouth at bedtime as needed (cold,sleep).    [provider]  Pseudoephedrine-APAP-DM 40-981-19 MG CAPS Take 2 capsules by mouth every 8 (eight) hours as needed (cold).    [provider]    Family History Family History  Problem Relation Age of Onset  . Arthritis Other   . Cancer Other        breast cancer  . Hypertension Other   . Asthma Other   . Hyperlipidemia Other   . Hypertension Other   . Diabetes Other     Social History Social History   Tobacco Use  . Smoking status: Former Smoker    Packs/day: 0.10    Types: Cigarettes  . Smokeless tobacco: Never Used  Substance Use Topics  . Alcohol use: Yes    Comment: Once a week   . Drug use: Yes    Types: Marijuana     Allergies   Patient has no known allergies.   Review of Systems Review of Systems  Gastrointestinal: Positive for abdominal pain (Resolved), diarrhea, nausea and vomiting.  All other systems reviewed and are negative.    Physical Exam Updated Vital Signs BP (!) 147/107 (BP Location: Right Arm)   Pulse 77   Temp 98.4 F (36.9 C) (Oral)   Resp 18   Ht 4\' 11"  (1.499 m)   Wt 73.5 kg (162 lb)   LMP 06/06/2017   SpO2 100%   BMI 32.72 kg/m   Physical Exam  Constitutional: She is oriented to person, place, and time. She appears well-developed and well-nourished. No distress.  NAD. Nontoxic.  HENT:  Head: Normocephalic and atraumatic.  Right Ear: External ear normal.  Left Ear: External ear normal.  Nose: Nose normal.  Moist mucous membranes.  Eyes: Conjunctivae and EOM are normal. No scleral icterus.  Neck: Normal range of motion. Neck supple.  Cardiovascular: Normal rate, regular rhythm and normal heart sounds.  No murmur heard. Pulmonary/Chest: Effort normal and breath sounds normal. She has no wheezes.  Abdominal: Soft. Bowel sounds are normal. There is no tenderness.  Soft, nontender, nondistended. Negative Murphy's and McBurney's. No  suprapubic or CVA tenderness. No pulsating abdominal masses.  Musculoskeletal: Normal range of motion. She exhibits no deformity.  Neurological: She is alert and oriented to person, place, and time.  Skin: Skin is warm and dry. Capillary refill takes less than 2 seconds.  Psychiatric: She has a normal mood and affect. Her behavior is normal. Judgment and thought content normal.  Nursing note and vitals reviewed.    ED Treatments / Results  Labs (all labs ordered are listed, but only abnormal results are displayed) Labs Reviewed  COMPREHENSIVE METABOLIC PANEL - Abnormal; Notable for the following components:      Result Value   Glucose, Bld 112 (*)    Calcium 8.7 (*)    All other components within normal limits  URINALYSIS, ROUTINE W REFLEX MICROSCOPIC - Abnormal; Notable for the following components:  Hgb urine dipstick MODERATE (*)    Bacteria, UA RARE (*)    Squamous Epithelial / LPF 0-5 (*)    All other components within normal limits  LIPASE, BLOOD  CBC  I-STAT BETA HCG BLOOD, ED ([facility], [facility], [facility] ONLY)    EKG  EKG Interpretation None       Radiology No results found.  Procedures Procedures (including critical care time)  Medications Ordered in ED Medications  ondansetron (ZOFRAN-ODT) disintegrating tablet 4 mg (4 mg Oral Given 07/11/17 1059)  ondansetron (ZOFRAN-ODT) disintegrating tablet 4 mg (4 mg Oral Given 07/11/17 1608)     Initial Impression / Assessment and Plan / ED Course  I have reviewed the triage vital signs and the nursing notes.  Pertinent labs & imaging results that were available during my care of the patient were reviewed by me and considered in my medical decision making (see chart for details).     27 year old female with history of gastritis and poorly controlled asthma here for sudden onset, improving nausea, vomiting and diarrhea. Associated periumbilical and diffuse lower abdominal pain has since resolved. Currently she is endorsing mild  nausea. Friend has influenza.  Exam is reassuring. Abdomen is soft, nontender and nondistended. No CVA tenderness. Nontoxic. No signs of significant dehydration. No urinary or vaginal symptoms or abdominal tenderness on exam to warrant pelvic exam today.  Lab work reassuring. Patient tolerated fluid challenge. Repeat abdominal exam unchanged from initial, nontender. High suspicion for viral gastroenteritis. Doubt appendicitis, cholecystitis, pancreatitis, SBO, dissection, UTI, pyelo, torsion given clinical picture and ED work up. Discussed specific signs and symptoms of would warrant return to the ED including fever, focal abdominal pain, continued vomiting, bloody. The inability to tolerate fluids by mouth. Patient verbalized understanding and agreeable with plan. Work note given.  Final Clinical Impressions(s) / ED Diagnoses   Final diagnoses:  Nausea vomiting and diarrhea    ED Discharge Orders        Ordered    ondansetron (ZOFRAN ODT) 4 MG disintegrating tablet  Every 8 hours PRN     07/11/17 1622       Liberty HandyGibbons, Idali Lafever J, PA-C 07/11/17 2317    Raeford RazorKohut, Stephen, MD 07/12/17 [phone]

## 2017-07-11 NOTE — Discharge Instructions (Signed)
Your symptoms likely are from a 24-36 hour stomach bug.   Stay well hydrated. Stick to mild, low fat, diet for the next 24 hours. Transition back to normal diet slowly. Use zofran for nausea.   Return for fevers, worsening abdominal pain, continued vomiting, bloody diarrhea or vomiting.

## 2017-07-11 NOTE — ED Triage Notes (Signed)
Patient c/o mid abdominal and RLQ pain since 0800 today. Patient also c/o N/V/D.

## 2017-08-22 ENCOUNTER — Encounter: Payer: Self-pay | Admitting: Family Medicine

## 2017-08-22 ENCOUNTER — Ambulatory Visit (INDEPENDENT_AMBULATORY_CARE_PROVIDER_SITE_OTHER): Payer: Managed Care, Other (non HMO) | Admitting: Family Medicine

## 2017-08-22 VITALS — BP 120/100 | HR 73 | Ht 59.0 in | Wt 162.8 lb

## 2017-08-22 DIAGNOSIS — Z23 Encounter for immunization: Secondary | ICD-10-CM | POA: Diagnosis not present

## 2017-08-22 DIAGNOSIS — Z6832 Body mass index (BMI) 32.0-32.9, adult: Secondary | ICD-10-CM

## 2017-08-22 DIAGNOSIS — R7309 Other abnormal glucose: Secondary | ICD-10-CM

## 2017-08-22 DIAGNOSIS — Z0000 Encounter for general adult medical examination without abnormal findings: Secondary | ICD-10-CM

## 2017-08-22 DIAGNOSIS — E6609 Other obesity due to excess calories: Secondary | ICD-10-CM | POA: Diagnosis not present

## 2017-08-22 DIAGNOSIS — J4542 Moderate persistent asthma with status asthmaticus: Secondary | ICD-10-CM

## 2017-08-22 DIAGNOSIS — R7989 Other specified abnormal findings of blood chemistry: Secondary | ICD-10-CM

## 2017-08-22 DIAGNOSIS — R030 Elevated blood-pressure reading, without diagnosis of hypertension: Secondary | ICD-10-CM

## 2017-08-22 LAB — URINALYSIS, ROUTINE W REFLEX MICROSCOPIC
Bilirubin Urine: NEGATIVE
Hgb urine dipstick: NEGATIVE
Ketones, ur: NEGATIVE
Leukocytes, UA: NEGATIVE
Nitrite: NEGATIVE
Specific Gravity, Urine: >=1.03 — AB (ref 1.000–1.030)
Total Protein, Urine: NEGATIVE
Urine Glucose: NEGATIVE
Urobilinogen, UA: 0.2 (ref 0.0–1.0)
pH: 5.5 (ref 5.0–8.0)

## 2017-08-22 LAB — COMPREHENSIVE METABOLIC PANEL
ALT: 19 U/L (ref 0–35)
AST: 18 U/L (ref 0–37)
Albumin: 3.8 g/dL (ref 3.5–5.2)
Alkaline Phosphatase: 53 U/L (ref 39–117)
BUN: 10 mg/dL (ref 6–23)
CO2: 28 mEq/L (ref 19–32)
Calcium: 8.8 mg/dL (ref 8.4–10.5)
Chloride: 106 mEq/L (ref 96–112)
Creatinine, Ser: 0.85 mg/dL (ref 0.40–1.20)
GFR: 102.56 mL/min (ref >60.00)
Glucose, Bld: 95 mg/dL (ref 70–99)
Potassium: 3.8 mEq/L (ref 3.5–5.1)
Sodium: 140 mEq/L (ref 135–145)
Total Bilirubin: 0.4 mg/dL (ref 0.2–1.2)
Total Protein: 7.1 g/dL (ref 6.0–8.3)

## 2017-08-22 LAB — LIPID PANEL
Cholesterol: 174 mg/dL (ref 0–200)
HDL: 57.4 mg/dL (ref >39.00)
LDL Cholesterol: 104 mg/dL — ABNORMAL HIGH (ref 0–99)
NonHDL: 116.43
Total CHOL/HDL Ratio: 3
Triglycerides: 60 mg/dL (ref 0.0–149.0)
VLDL: 12 mg/dL (ref 0.0–40.0)

## 2017-08-22 LAB — CBC
HCT: 39.9 % (ref 36.0–46.0)
Hemoglobin: 13.5 g/dL (ref 12.0–15.0)
MCHC: 33.8 g/dL (ref 30.0–36.0)
MCV: 81.8 fl (ref 78.0–100.0)
Platelets: 346 10*3/uL (ref 150.0–400.0)
RBC: 4.88 Mil/uL (ref 3.87–5.11)
RDW: 13.5 % (ref 11.5–15.5)
WBC: 4.4 10*3/uL (ref 4.0–10.5)

## 2017-08-22 LAB — TSH: TSH: 1.9 u[IU]/mL (ref 0.35–4.50)

## 2017-08-22 NOTE — Progress Notes (Signed)
Subjective:  Patient ID: Andrea Daniels, female    DOB: 1991-04-24  Age: 27 y.o. MRN: [id number]  CC: No chief complaint on file.   HPI Lolita L Jenelle Mages presents for the establishment of care.  Past medical history of asthma for the last 7 years.  She says that she quit smoking a few years ago.  She continues to smoke marijuana on occasion.  She is here with her girlfriend.  They are raising her girlfriends 53-year-old daughter.  Patient works at [employer] at [employer].  She is using her Symbicort once daily and her Ventolin inhaler once daily.  She tells me this is how she was instructed to use the Symbicort.  Both of her parents have hypertension.  Diabetes does also run in the family.  Last Pap smear was 3 years ago.  She is seeking a female provider for her GYN care.   History Lyndsey has a past medical history of Asthma, Gastritis, Gastritis, Hypertension, Ovarian cyst, and UTI (lower urinary tract infection).   She has a past surgical history that includes No past surgeries.   Her family history includes Arthritis in her father, maternal grandmother, mother, and other; Asthma in her maternal grandmother and other; Cancer in her maternal grandmother and other; Diabetes in her maternal grandmother and other; Early death in her brother; Hearing loss in her maternal grandmother and mother; Hyperlipidemia in her other; Hypertension in her father, maternal grandmother, mother, other, and other; Kidney disease in her paternal grandmother; Stroke in her paternal grandfather.She reports that she has quit smoking. Her smoking use included cigarettes. She smoked 0.10 packs per day. she has never used smokeless tobacco. She reports that she drinks alcohol. She reports that she uses drugs. Drug: Marijuana.  Outpatient Medications Prior to Visit  Medication Sig Dispense Refill  . albuterol (PROVENTIL HFA;VENTOLIN HFA) 108 (90 Base) MCG/ACT inhaler Inhale 2 puffs into the lungs every 4 (four) hours as needed  for wheezing or shortness of breath. 1 Inhaler 5  . budesonide-formoterol (SYMBICORT) 80-4.5 MCG/ACT inhaler Inhale 2 puffs into the lungs daily. 1 Inhaler 0  . diclofenac (VOLTAREN) 75 MG EC tablet     . loratadine-pseudoephedrine (CLARITIN-D 12 HOUR) 5-120 MG tablet Take 1 tablet by mouth 2 (two) times daily. 30 tablet 0  . montelukast (SINGULAIR) 10 MG tablet Take 1 tablet (10 mg total) by mouth at bedtime. 30 tablet 0  . amoxicillin (AMOXIL) 500 MG capsule Take 2 capsules (1,000 mg total) by mouth 2 (two) times daily. 28 capsule 0  . azithromycin (ZITHROMAX) 250 MG tablet Take 1 tablet (250 mg total) by mouth daily. Take 1 tablet by mouth daily beginning tomorrow. You received your first dose in the ER today. 4 tablet 0  . lidocaine (XYLOCAINE) 2 % solution Use as directed 15 mLs in the mouth or throat every 3 (three) hours as needed for mouth pain. 100 mL 0  . ondansetron (ZOFRAN ODT) 4 MG disintegrating tablet Take 1 tablet (4 mg total) by mouth every 8 (eight) hours as needed for nausea or vomiting. 20 tablet 0  . predniSONE (DELTASONE) 20 MG tablet Take 2 tablets (40 mg total) by mouth daily. 10 tablet 0  . promethazine-dextromethorphan (PROMETHAZINE-DM) 6.25-15 MG/5ML syrup Take 5 mLs by mouth 4 (four) times daily as needed for cough. 118 mL 0  . Pseudoeph-Doxylamine-DM-APAP 30-6.25-15-325 MG CAPS Take 2 capsules by mouth at bedtime as needed (cold,sleep).    . Pseudoephedrine-APAP-DM 30-325-15 MG CAPS Take 2 capsules by  mouth every 8 (eight) hours as needed (cold).     No facility-administered medications prior to visit.     ROS Review of Systems  Constitutional: Negative for chills, fatigue and fever.  HENT: Negative.   Eyes: Negative for photophobia and visual disturbance.  Respiratory: Positive for cough and wheezing. Negative for chest tightness, shortness of breath and stridor.   Cardiovascular: Negative.   Gastrointestinal: Negative.   Endocrine: Negative for polyphagia and  polyuria.  Genitourinary: Negative.   Musculoskeletal: Negative for gait problem and joint swelling.  Skin: Negative for rash and wound.  Allergic/Immunologic: Negative for immunocompromised state.  Neurological: Negative for weakness and headaches.  Hematological: Does not bruise/bleed easily.  Psychiatric/Behavioral: Negative.     Objective:  BP (!) 120/100 (BP Location: Right Arm, Patient Position: Sitting, Cuff Size: Normal)   Pulse 73   Ht 4\' 11"  (1.499 m)   Wt 162 lb 12.8 oz (73.8 kg)   BMI 32.88 kg/m   Physical Exam  Constitutional: She is oriented to person, place, and time. She appears well-developed and well-nourished. No distress.  HENT:  Head: Normocephalic and atraumatic.  Right Ear: External ear normal.  Left Ear: External ear normal.  Mouth/Throat: Oropharynx is clear and moist. No oropharyngeal exudate.  Eyes: Conjunctivae are normal. Pupils are equal, round, and reactive to light. Right eye exhibits no discharge. Left eye exhibits no discharge. No scleral icterus.  Neck: Neck supple. No JVD present. No tracheal deviation present. No thyromegaly present.  Cardiovascular: Normal rate, regular rhythm and normal heart sounds.  Pulmonary/Chest: Effort normal and breath sounds normal. No stridor. No respiratory distress. She has no wheezes. She has no rales.  Abdominal: Soft. Bowel sounds are normal. She exhibits no distension. There is no tenderness. There is no rebound and no guarding. Hernia confirmed negative in the ventral area.  Lymphadenopathy:    She has no cervical adenopathy.  Neurological: She is alert and oriented to person, place, and time.  Skin: Skin is warm and dry. She is not diaphoretic.  Psychiatric: She has a normal mood and affect. Her behavior is normal.      Assessment & Plan:   Diagnoses and all orders for this visit:  Moderate persistent asthma with status asthmaticus -     Pneumococcal conjugate vaccine 13-valent IM  Low TSH level -      TSH  Elevated glucose -     Comprehensive metabolic panel  Health care maintenance -     CBC -     Comprehensive metabolic panel -     Lipid panel -     Urinalysis, Routine w reflex microscopic  Elevated BP without diagnosis of hypertension  Class 1 obesity due to excess calories without serious comorbidity with body mass index (BMI) of 32.0 to 32.9 in adult  Need for immunization against influenza -     Flu Vaccine QUAD 36+ mos IM  Need for prophylactic vaccination against Streptococcus pneumoniae (pneumococcus) -     Pneumococcal conjugate vaccine 13-valent IM   I have discontinued Dianna L. Hairston's lidocaine, promethazine-dextromethorphan, Pseudoephedrine-APAP-DM, Pseudoeph-Doxylamine-DM-APAP, amoxicillin, azithromycin, predniSONE, and ondansetron. I am also having her maintain her albuterol, loratadine-pseudoephedrine, montelukast, budesonide-formoterol, and diclofenac.  No orders of the defined types were placed in this encounter.  Advised her to start using her Symbicort twice a day.  We discussed that using a rescue inhaler more than twice a week signifies suboptimal asthma control.  She will try to lose some weight.  Also provided her with  information about the DASH diet and cutting back on her salt and also increasing exercise.  She will follow-up in 3 months.  I measured a BP today 120/88.  Follow-up: Return in about 3 months (around 11/19/2017).  Mliss Sax, MD

## 2017-08-22 NOTE — Patient Instructions (Addendum)
Asthma, Adult Asthma is a condition of the lungs in which the airways tighten and narrow. Asthma can make it hard to breathe. Asthma cannot be cured, but medicine and lifestyle changes can help control it. Asthma may be started (triggered) by:  Animal skin flakes (dander).  Dust.  Cockroaches.  Pollen.  Mold.  Smoke.  Cleaning products.  Hair sprays or aerosol sprays.  Paint fumes or strong smells.  Cold air, weather changes, and winds.  Crying or laughing hard.  Stress.  Certain medicines or drugs.  Foods, such as dried fruit, potato chips, and sparkling grape juice.  Infections or conditions (colds, flu).  Exercise.  Certain medical conditions or diseases.  Exercise or tiring activities.  Follow these instructions at home:  Take medicine as told by your doctor.  Use a peak flow meter as told by your doctor. A peak flow meter is a tool that measures how well the lungs are working.  Record and keep track of the peak flow meter's readings.  Understand and use the asthma action plan. An asthma action plan is a written plan for taking care of your asthma and treating your attacks.  To help prevent asthma attacks: ? Do not smoke. Stay away from secondhand smoke. ? Change your heating and air conditioning filter often. ? Limit your use of fireplaces and wood stoves. ? Get rid of pests (such as roaches and mice) and their droppings. ? Throw away plants if you see mold on them. ? Clean your floors. Dust regularly. Use cleaning products that do not smell. ? Have someone vacuum when you are not home. Use a vacuum cleaner with a HEPA filter if possible. ? Replace carpet with wood, tile, or vinyl flooring. Carpet can trap animal skin flakes and dust. ? Use allergy-proof pillows, mattress covers, and box spring covers. ? Wash bed sheets and blankets every week in hot water and dry them in a dryer. ? Use blankets that are made of polyester or cotton. ? Clean bathrooms  and kitchens with bleach. If possible, have someone repaint the walls in these rooms with mold-resistant paint. Keep out of the rooms that are being cleaned and painted. ? Wash hands often. Contact a doctor if:  You have make a whistling sound when breaking (wheeze), have shortness of breath, or have a cough even if taking medicine to prevent attacks.  The colored mucus you cough up (sputum) is thicker than usual.  The colored mucus you cough up changes from clear or white to yellow, green, gray, or bloody.  You have problems from the medicine you are taking such as: ? A rash. ? Itching. ? Swelling. ? Trouble breathing.  You need reliever medicines more than 2-3 times a week.  Your peak flow measurement is still at 50-79% of your personal best after following the action plan for 1 hour.  You have a fever. Get help right away if:  You seem to be worse and are not responding to medicine during an asthma attack.  You are short of breath even at rest.  You get short of breath when doing very little activity.  You have trouble eating, drinking, or talking.  You have chest pain.  You have a fast heartbeat.  Your lips or fingernails start to turn blue.  You are light-headed, dizzy, or faint.  Your peak flow is less than 50% of your personal best. This information is not intended to replace advice given to you by your health care provider. Make   sure you discuss any questions you have with your health care provider. Document Released: 11/30/2007 Document Revised: 11/19/2015 Document Reviewed: 01/10/2013 Elsevier Interactive Patient Education  2017 [place] DASH stands for "Dietary Approaches to Stop Hypertension." The DASH eating plan is a healthy eating plan that has been shown to reduce high blood pressure (hypertension). It may also reduce your risk for type 2 diabetes, heart disease, and stroke. The DASH eating plan may also help with weight loss. What  are tips for following this plan? General guidelines  Avoid eating more than 2,300 mg (milligrams) of salt (sodium) a day. If you have hypertension, you may need to reduce your sodium intake to 1,500 mg a day.  Limit alcohol intake to no more than 1 drink a day for nonpregnant women and 2 drinks a day for men. One drink equals 12 oz of beer, 5 oz of wine, or 1 oz of hard liquor.  Work with your health care provider to maintain a healthy body weight or to lose weight. Ask what an ideal weight is for you.  Get at least 30 minutes of exercise that causes your heart to beat faster (aerobic exercise) most days of the week. Activities may include walking, swimming, or biking.  Work with your health care provider or diet and nutrition specialist (dietitian) to adjust your eating plan to your individual calorie needs. Reading food labels  Check food labels for the amount of sodium per serving. Choose foods with less than 5 percent of the Daily Value of sodium. Generally, foods with less than 300 mg of sodium per serving fit into this eating plan.  To find whole grains, look for the word "whole" as the first word in the ingredient list. Shopping  Buy products labeled as "low-sodium" or "no salt added."  Buy fresh foods. Avoid canned foods and premade or frozen meals. Cooking  Avoid adding salt when cooking. Use salt-free seasonings or herbs instead of table salt or sea salt. Check with your health care provider or pharmacist before using salt substitutes.  Do not fry foods. Cook foods using healthy methods such as baking, boiling, grilling, and broiling instead.  Cook with heart-healthy oils, such as olive, canola, soybean, or sunflower oil. Meal planning   Eat a balanced diet that includes: ? 5 or more servings of fruits and vegetables each day. At each meal, try to fill half of your plate with fruits and vegetables. ? Up to 6-8 servings of whole grains each day. ? Less than 6 oz of lean  meat, poultry, or fish each day. A 3-oz serving of meat is about the same size as a deck of cards. One egg equals 1 oz. ? 2 servings of low-fat dairy each day. ? A serving of nuts, seeds, or beans 5 times each week. ? Heart-healthy fats. Healthy fats called Omega-3 fatty acids are found in foods such as flaxseeds and coldwater fish, like sardines, salmon, and mackerel.  Limit how much you eat of the following: ? Canned or prepackaged foods. ? Food that is high in trans fat, such as fried foods. ? Food that is high in saturated fat, such as fatty meat. ? Sweets, desserts, sugary drinks, and other foods with added sugar. ? Full-fat dairy products.  Do not salt foods before eating.  Try to eat at least 2 vegetarian meals each week.  Eat more home-cooked food and less restaurant, buffet, and fast food.  When eating at a restaurant, ask that  your food be prepared with less salt or no salt, if possible. What foods are recommended? The items listed may not be a complete list. Talk with your dietitian about what dietary choices are best for you. Grains Whole-grain or whole-wheat bread. Whole-grain or whole-wheat pasta. Brown rice. [place]. Bulgur. Whole-grain and low-sodium cereals. Pita bread. Low-fat, low-sodium crackers. Whole-wheat flour tortillas. Vegetables Fresh or frozen vegetables (raw, steamed, roasted, or grilled). Low-sodium or reduced-sodium tomato and vegetable juice. Low-sodium or reduced-sodium tomato sauce and tomato paste. Low-sodium or reduced-sodium canned vegetables. Fruits All fresh, dried, or frozen fruit. Canned fruit in natural juice (without added sugar). Meat and other protein foods Skinless chicken or [place]. Ground chicken or [place]. Pork with fat trimmed off. Fish and seafood. Egg whites. Dried beans, peas, or lentils. Unsalted nuts, nut butters, and seeds. Unsalted canned beans. Lean cuts of beef with fat trimmed off. Low-sodium, lean deli  meat. Dairy Low-fat (1%) or fat-free (skim) milk. Fat-free, low-fat, or reduced-fat cheeses. Nonfat, low-sodium ricotta or cottage cheese. Low-fat or nonfat yogurt. Low-fat, low-sodium cheese. Fats and oils Soft margarine without trans fats. Vegetable oil. Low-fat, reduced-fat, or light mayonnaise and salad dressings (reduced-sodium). Canola, safflower, olive, soybean, and sunflower oils. Avocado. Seasoning and other foods Herbs. Spices. Seasoning mixes without salt. Unsalted popcorn and pretzels. Fat-free sweets. What foods are not recommended? The items listed may not be a complete list. Talk with your dietitian about what dietary choices are best for you. Grains Baked goods made with fat, such as croissants, muffins, or some breads. Dry pasta or rice meal packs. Vegetables Creamed or fried vegetables. Vegetables in a cheese sauce. Regular canned vegetables (not low-sodium or reduced-sodium). Regular canned tomato sauce and paste (not low-sodium or reduced-sodium). Regular tomato and vegetable juice (not low-sodium or reduced-sodium). Angie Fava. Olives. Fruits Canned fruit in a light or heavy syrup. Fried fruit. Fruit in cream or butter sauce. Meat and other protein foods Fatty cuts of meat. Ribs. Fried meat. Berniece Salines. Sausage. Bologna and other processed lunch meats. Salami. Fatback. Hotdogs. Bratwurst. Salted nuts and seeds. Canned beans with added salt. Canned or smoked fish. Whole eggs or egg yolks. Chicken or [place] with skin. Dairy Whole or 2% milk, cream, and half-and-half. Whole or full-fat cream cheese. Whole-fat or sweetened yogurt. Full-fat cheese. Nondairy creamers. Whipped toppings. Processed cheese and cheese spreads. Fats and oils Butter. Stick margarine. Lard. Shortening. Ghee. Bacon fat. Tropical oils, such as coconut, palm kernel, or palm oil. Seasoning and other foods Salted popcorn and pretzels. Onion salt, garlic salt, seasoned salt, table salt, and sea salt. Worcestershire  sauce. Tartar sauce. Barbecue sauce. Teriyaki sauce. Soy sauce, including reduced-sodium. Steak sauce. Canned and packaged gravies. Fish sauce. Oyster sauce. Cocktail sauce. Horseradish that you find on the shelf. Ketchup. Mustard. Meat flavorings and tenderizers. Bouillon cubes. Hot sauce and Tabasco sauce. Premade or packaged marinades. Premade or packaged taco seasonings. Relishes. Regular salad dressings. Where to find more information:  National Heart, Lung, and [place]: https://wilson-eaton.com/  American Heart Association: www.heart.org Summary  The DASH eating plan is a healthy eating plan that has been shown to reduce high blood pressure (hypertension). It may also reduce your risk for type 2 diabetes, heart disease, and stroke.  With the DASH eating plan, you should limit salt (sodium) intake to 2,300 mg a day. If you have hypertension, you may need to reduce your sodium intake to 1,500 mg a day.  When on the DASH eating plan, aim to eat more fresh fruits and  vegetables, whole grains, lean proteins, low-fat dairy, and heart-healthy fats.  Work with your health care provider or diet and nutrition specialist (dietitian) to adjust your eating plan to your individual calorie needs. This information is not intended to replace advice given to you by your health care provider. Make sure you discuss any questions you have with your health care provider. Document Released: 06/02/2011 Document Revised: 06/06/2016 Document Reviewed: 06/06/2016 Elsevier Interactive Patient Education  2018 [employer].  Exercising to [employer] Exercising can help you to lose weight. In order to lose weight through exercise, you need to do vigorous-intensity exercise. You can tell that you are exercising with vigorous intensity if you are breathing very hard and fast and cannot hold a conversation while exercising. Moderate-intensity exercise helps to maintain your current weight. You can tell that you are  exercising at a moderate level if you have a higher heart rate and faster breathing, but you are still able to hold a conversation. How often should I exercise? Choose an activity that you enjoy and set realistic goals. Your health care provider can help you to make an activity plan that works for you. Exercise regularly as directed by your health care provider. This may include:  Doing resistance training twice each week, such as: ? Push-ups. ? Sit-ups. ? Lifting weights. ? Using resistance bands.  Doing a given intensity of exercise for a given amount of time. Choose from these options: ? 150 minutes of moderate-intensity exercise every week. ? 75 minutes of vigorous-intensity exercise every week. ? A mix of moderate-intensity and vigorous-intensity exercise every week.  Children, pregnant women, people who are out of shape, people who are overweight, and older adults may need to consult a health care provider for individual recommendations. If you have any sort of medical condition, be sure to consult your health care provider before starting a new exercise program. What are some activities that can help me to lose weight?  Walking at a rate of at least 4.5 miles an hour.  Jogging or running at a rate of 5 miles per hour.  Biking at a rate of at least 10 miles per hour.  Lap swimming.  Roller-skating or in-line skating.  Cross-country skiing.  Vigorous competitive sports, such as football, basketball, and soccer.  Jumping rope.  Aerobic dancing. How can I be more active in my day-to-day activities?  Use the stairs instead of the elevator.  Take a walk during your lunch break.  If you drive, park your car farther away from work or school.  If you take public transportation, get off one stop early and walk the rest of the way.  Make all of your phone calls while standing up and walking around.  Get up, stretch, and walk around every 30 minutes throughout the day. What  guidelines should I follow while exercising?  Do not exercise so much that you hurt yourself, feel dizzy, or get very short of breath.  Consult your health care provider prior to starting a new exercise program.  Wear comfortable clothes and shoes with good support.  Drink plenty of water while you exercise to prevent dehydration or heat stroke. Body water is lost during exercise and must be replaced.  Work out until you breathe faster and your heart beats faster. This information is not intended to replace advice given to you by your health care provider. Make sure you discuss any questions you have with your health care provider. Document Released: 07/16/2010 Document Revised: 11/19/2015  Document Reviewed: 11/14/2013 Elsevier Interactive Patient Education  2018 [employer] BMI for Adults Body mass index (BMI) is a number that is calculated from a person's weight and height. In most adults, the number is used to find how much of an adult's weight is made up of fat. BMI is not as accurate as a direct measure of body fat. How is BMI calculated? BMI is calculated by dividing weight in kilograms by height in meters squared. It can also be calculated by dividing weight in pounds by height in inches squared, then multiplying the resulting number by 703. Charts are available to help you find your BMI quickly and easily without doing this calculation. How is BMI interpreted? Health care professionals use BMI charts to identify whether an adult is underweight, at a normal weight, or overweight based on the following guidelines:  Underweight: BMI less than 18.5.  Normal weight: BMI between 18.5 and 24.9.  Overweight: BMI between 25 and 29.9.  Obese: BMI of 30 and above.  BMI is usually interpreted the same for males and females. Weight includes both fat and muscle, so someone with a muscular build, such as an athlete, may have a BMI that is higher than 24.9. In cases like these, BMI may not  accurately depict body fat. To determine if excess body fat is the cause of a BMI of 25 or higher, further assessments may need to be done by a health care provider. Why is BMI a useful tool? BMI is used to identify a possible weight problem that may be related to a medical problem or may increase the risk for medical problems. BMI can also be used to promote changes to reach a healthy weight. This information is not intended to replace advice given to you by your health care provider. Make sure you discuss any questions you have with your health care provider. Document Released: 02/23/2004 Document Revised: 10/22/2015 Document Reviewed: 11/08/2013 Elsevier Interactive Patient Education  2018 [employer].  Calorie Counting for [employer] Calories are units of energy. Your body needs a certain amount of calories from food to keep you going throughout the day. When you eat more calories than your body needs, your body stores the extra calories as fat. When you eat fewer calories than your body needs, your body burns fat to get the energy it needs. Calorie counting means keeping track of how many calories you eat and drink each day. Calorie counting can be helpful if you need to lose weight. If you make sure to eat fewer calories than your body needs, you should lose weight. Ask your health care provider what a healthy weight is for you. For calorie counting to work, you will need to eat the right number of calories in a day in order to lose a healthy amount of weight per week. A dietitian can help you determine how many calories you need in a day and will give you suggestions on how to reach your calorie goal.  A healthy amount of weight to lose per week is usually 1-2 lb (0.5-0.9 kg). This usually means that your daily calorie intake should be reduced by 500-750 calories.  Eating 1,200 - 1,500 calories per day can help most women lose weight.  Eating 1,500 - 1,800 calories per day can help most men  lose weight.  What is my plan? My goal is to have __________ calories per day. If I have this many calories per day, I should lose around __________ pounds per  week. What do I need to know about calorie counting? In order to meet your daily calorie goal, you will need to:  Find out how many calories are in each food you would like to eat. Try to do this before you eat.  Decide how much of the food you plan to eat.  Write down what you ate and how many calories it had. Doing this is called keeping a food log.  To successfully lose weight, it is important to balance calorie counting with a healthy lifestyle that includes regular activity. Aim for 150 minutes of moderate exercise (such as walking) or 75 minutes of vigorous exercise (such as running) each week. Where do I find calorie information?  The number of calories in a food can be found on a Nutrition Facts label. If a food does not have a Nutrition Facts label, try to look up the calories online or ask your dietitian for help. Remember that calories are listed per serving. If you choose to have more than one serving of a food, you will have to multiply the calories per serving by the amount of servings you plan to eat. For example, the label on a package of bread might say that a serving size is 1 slice and that there are 90 calories in a serving. If you eat 1 slice, you will have eaten 90 calories. If you eat 2 slices, you will have eaten 180 calories. How do I keep a food log? Immediately after each meal, record the following information in your food log:  What you ate. Don't forget to include toppings, sauces, and other extras on the food.  How much you ate. This can be measured in cups, ounces, or number of items.  How many calories each food and drink had.  The total number of calories in the meal.  Keep your food log near you, such as in a small notebook in your pocket, or use a mobile app or website. Some programs will  calculate calories for you and show you how many calories you have left for the day to meet your goal. What are some calorie counting tips?  Use your calories on foods and drinks that will fill you up and not leave you hungry: ? Some examples of foods that fill you up are nuts and nut butters, vegetables, lean proteins, and high-fiber foods like whole grains. High-fiber foods are foods with more than 5 g fiber per serving. ? Drinks such as sodas, specialty coffee drinks, alcohol, and juices have a lot of calories, yet do not fill you up.  Eat nutritious foods and avoid empty calories. Empty calories are calories you get from foods or beverages that do not have many vitamins or protein, such as candy, sweets, and soda. It is better to have a nutritious high-calorie food (such as an avocado) than a food with few nutrients (such as a bag of chips).  Know how many calories are in the foods you eat most often. This will help you calculate calorie counts faster.  Pay attention to calories in drinks. Low-calorie drinks include water and unsweetened drinks.  Pay attention to nutrition labels for "low fat" or "fat free" foods. These foods sometimes have the same amount of calories or more calories than the full fat versions. They also often have added sugar, starch, or salt, to make up for flavor that was removed with the fat.  Find a way of tracking calories that works for you. Get creative. Try  different apps or programs if writing down calories does not work for you. What are some portion control tips?  Know how many calories are in a serving. This will help you know how many servings of a certain food you can have.  Use a measuring cup to measure serving sizes. You could also try weighing out portions on a kitchen scale. With time, you will be able to estimate serving sizes for some foods.  Take some time to put servings of different foods on your favorite plates, bowls, and cups so you know what a  serving looks like.  Try not to eat straight from a bag or box. Doing this can lead to overeating. Put the amount you would like to eat in a cup or on a plate to make sure you are eating the right portion.  Use smaller plates, glasses, and bowls to prevent overeating.  Try not to multitask (for example, watch TV or use your computer) while eating. If it is time to eat, sit down at a table and enjoy your food. This will help you to know when you are full. It will also help you to be aware of what you are eating and how much you are eating. What are tips for following this plan? Reading food labels  Check the calorie count compared to the serving size. The serving size may be smaller than what you are used to eating.  Check the source of the calories. Make sure the food you are eating is high in vitamins and protein and low in saturated and trans fats. Shopping  Read nutrition labels while you shop. This will help you make healthy decisions before you decide to purchase your food.  Make a grocery list and stick to it. Cooking  Try to cook your favorite foods in a healthier way. For example, try baking instead of frying.  Use low-fat dairy products. Meal planning  Use more fruits and vegetables. Half of your plate should be fruits and vegetables.  Include lean proteins like poultry and fish. How do I count calories when eating out?  Ask for smaller portion sizes.  Consider sharing an entree and sides instead of getting your own entree.  If you get your own entree, eat only half. Ask for a box at the beginning of your meal and put the rest of your entree in it so you are not tempted to eat it.  If calories are listed on the menu, choose the lower calorie options.  Choose dishes that include vegetables, fruits, whole grains, low-fat dairy products, and lean protein.  Choose items that are boiled, broiled, grilled, or steamed. Stay away from items that are buttered, battered, fried,  or served with cream sauce. Items labeled "crispy" are usually fried, unless stated otherwise.  Choose water, low-fat milk, unsweetened iced tea, or other drinks without added sugar. If you want an alcoholic beverage, choose a lower calorie option such as a glass of wine or light beer.  Ask for dressings, sauces, and syrups on the side. These are usually high in calories, so you should limit the amount you eat.  If you want a salad, choose a garden salad and ask for grilled meats. Avoid extra toppings like bacon, cheese, or fried items. Ask for the dressing on the side, or ask for olive oil and vinegar or lemon to use as dressing.  Estimate how many servings of a food you are given. For example, a serving of cooked rice is  cup or about the size of half a baseball. Knowing serving sizes will help you be aware of how much food you are eating at restaurants. The list below tells you how big or small some common portion sizes are based on everyday objects: ? 1 oz-4 stacked dice. ? 3 oz-1 deck of cards. ? 1 tsp-1 die. ? 1 Tbsp- a ping-pong ball. ? 2 Tbsp-1 ping-pong ball. ?  cup- baseball. ? 1 cup-1 baseball. Summary  Calorie counting means keeping track of how many calories you eat and drink each day. If you eat fewer calories than your body needs, you should lose weight.  A healthy amount of weight to lose per week is usually 1-2 lb (0.5-0.9 kg). This usually means reducing your daily calorie intake by 500-750 calories.  The number of calories in a food can be found on a Nutrition Facts label. If a food does not have a Nutrition Facts label, try to look up the calories online or ask your dietitian for help.  Use your calories on foods and drinks that will fill you up, and not on foods and drinks that will leave you hungry.  Use smaller plates, glasses, and bowls to prevent overeating. This information is not intended to replace advice given to you by your health care provider. Make sure  you discuss any questions you have with your health care provider. Document Released: 06/13/2005 Document Revised: 05/13/2016 Document Reviewed: 05/13/2016 Elsevier Interactive Patient Education  [employer]2018 Elsevier Inc.

## 2017-08-24 ENCOUNTER — Other Ambulatory Visit: Payer: Self-pay

## 2017-08-24 DIAGNOSIS — J4552 Severe persistent asthma with status asthmaticus: Secondary | ICD-10-CM

## 2017-08-24 MED ORDER — BUDESONIDE-FORMOTEROL FUMARATE 80-4.5 MCG/ACT IN AERO: 2.0000 | INHALATION_SPRAY | Freq: Every day | RESPIRATORY_TRACT | 3 refills | Status: DC

## 2017-08-24 MED ORDER — ALBUTEROL SULFATE HFA 108 (90 BASE) MCG/ACT IN AERS: 2.0000 | INHALATION_SPRAY | RESPIRATORY_TRACT | 3 refills | Status: DC | PRN

## 2017-09-04 ENCOUNTER — Telehealth: Payer: Self-pay | Admitting: Family Medicine

## 2017-09-04 NOTE — Telephone Encounter (Signed)
Received prior authorization request for Symbicort 80-4.5, prior authorization approved from [id number]. Approval faxed back to [facility] on [address]

## 2017-09-18 ENCOUNTER — Encounter: Payer: Self-pay | Admitting: Family Medicine

## 2017-09-18 ENCOUNTER — Ambulatory Visit (INDEPENDENT_AMBULATORY_CARE_PROVIDER_SITE_OTHER): Payer: Managed Care, Other (non HMO) | Admitting: Family Medicine

## 2017-09-18 VITALS — BP 126/80 | HR 87 | Temp 97.9°F | Ht 59.0 in | Wt 159.2 lb

## 2017-09-18 DIAGNOSIS — J301 Allergic rhinitis due to pollen: Secondary | ICD-10-CM | POA: Diagnosis not present

## 2017-09-18 DIAGNOSIS — J4531 Mild persistent asthma with (acute) exacerbation: Secondary | ICD-10-CM

## 2017-09-18 MED ORDER — CETIRIZINE HCL 10 MG PO TABS: 10.0000 mg | ORAL_TABLET | Freq: Every day | ORAL | 11 refills | Status: DC

## 2017-09-18 MED ORDER — FLUTICASONE PROPIONATE 50 MCG/ACT NA SUSP: 2.0000 | Freq: Every day | NASAL | 6 refills | Status: AC

## 2017-09-18 MED ORDER — PREDNISONE 20 MG PO TABS: 20.0000 mg | ORAL_TABLET | Freq: Two times a day (BID) | ORAL | 0 refills | Status: AC

## 2017-09-18 NOTE — Progress Notes (Signed)
Subjective:  Patient ID: Andrea Daniels, female    DOB: 05/21/1991  Age: 27 y.o. MRN: [id number]  CC: Cough   HPI Andrea Daniels presents for 2 wk ho increased cough with sneezing and pnd. Increased wheezing with increased wheezing. No fever or chills. Scant phlegm production. Claritin with minimal effect.  Has been difficult   Proventil has been difficult to use because patient feels as though she coughs directly after trying to inhale a puff and loses medicinal effect.   She quit smoking 3 years ago.   Outpatient Medications Prior to Visit  Medication Sig Dispense Refill  . albuterol (PROVENTIL HFA;VENTOLIN HFA) 108 (90 Base) MCG/ACT inhaler Inhale 2 puffs into the lungs every 4 (four) hours as needed for wheezing or shortness of breath. 1 Inhaler 3  . budesonide-formoterol (SYMBICORT) 80-4.5 MCG/ACT inhaler Inhale 2 puffs into the lungs daily. 1 Inhaler 3  . diclofenac (VOLTAREN) 75 MG EC tablet     . loratadine-pseudoephedrine (CLARITIN-D 12 HOUR) 5-120 MG tablet Take 1 tablet by mouth 2 (two) times daily. 30 tablet 0  . montelukast (SINGULAIR) 10 MG tablet Take 1 tablet (10 mg total) by mouth at bedtime. 30 tablet 0   No facility-administered medications prior to visit.     ROS Review of Systems  Constitutional: Negative for chills, fatigue and fever.  HENT: Positive for postnasal drip and sneezing. Negative for rhinorrhea, sinus pressure, sinus pain and sore throat.   Eyes: Negative for discharge and itching.  Respiratory: Positive for choking and wheezing. Negative for chest tightness and shortness of breath.   Cardiovascular: Negative.   Gastrointestinal: Negative.   Genitourinary: Negative.   Musculoskeletal: Negative for arthralgias and myalgias.  Allergic/Immunologic: Negative for immunocompromised state.  Neurological: Negative for weakness and headaches.  Hematological: Does not bruise/bleed easily.  Psychiatric/Behavioral: Negative.     Objective:  BP 126/80 (BP  Location: Left Arm, Patient Position: Sitting, Cuff Size: Normal)   Pulse 87   Temp 97.9 F (36.6 C) (Oral)   Ht 4\' 11"  (1.499 m)   Wt 159 lb 4 oz (72.2 kg)   SpO2 97%   BMI 32.16 kg/m   BP Readings from Last 3 Encounters:  09/18/17 126/80  08/22/17 (!) 120/100  07/11/17 (!) 147/107    Wt Readings from Last 3 Encounters:  09/18/17 159 lb 4 oz (72.2 kg)  08/22/17 162 lb 12.8 oz (73.8 kg)  07/11/17 162 lb (73.5 kg)    Physical Exam  Constitutional: She is oriented to person, place, and time. She appears well-developed and well-nourished. No distress.  HENT:  Head: Normocephalic and atraumatic.  Right Ear: External ear normal.  Left Ear: External ear normal.  Mouth/Throat: No oropharyngeal exudate.  Eyes: Pupils are equal, round, and reactive to light. Right eye exhibits no discharge. Left eye exhibits no discharge. No scleral icterus.  Neck: Neck supple. No JVD present. No tracheal deviation present. No thyromegaly present.  Cardiovascular: Normal rate, regular rhythm and normal heart sounds.  Pulmonary/Chest: No stridor. No respiratory distress. She has no wheezes. She has no rales. She exhibits no tenderness.  Lymphadenopathy:    She has no cervical adenopathy.  Neurological: She is alert and oriented to person, place, and time.  Skin: Skin is warm and dry. She is not diaphoretic.  Psychiatric: She has a normal mood and affect. Her behavior is normal.    Lab Results  Component Value Date   WBC 4.4 08/22/2017   HGB 13.5 08/22/2017   HCT  39.9 08/22/2017   PLT 346.0 08/22/2017   GLUCOSE 95 08/22/2017   CHOL 174 08/22/2017   TRIG 60.0 08/22/2017   HDL 57.40 08/22/2017   LDLCALC 104 (H) 08/22/2017   ALT 19 08/22/2017   AST 18 08/22/2017   NA 140 08/22/2017   K 3.8 08/22/2017   CL 106 08/22/2017   CREATININE 0.85 08/22/2017   BUN 10 08/22/2017   CO2 28 08/22/2017   TSH 1.90 08/22/2017   HGBA1C 6.2 07/03/2014    No results found.  Assessment & Plan:   Andrea Daniels  was seen today for cough.  Diagnoses and all orders for this visit:  Allergic rhinitis due to pollen, unspecified seasonality -     fluticasone (FLONASE) 50 MCG/ACT nasal spray; Place 2 sprays into both nostrils daily. -     cetirizine (ZYRTEC) 10 MG tablet; Take 1 tablet (10 mg total) by mouth at bedtime. -     predniSONE (DELTASONE) 20 MG tablet; Take 1 tablet (20 mg total) by mouth 2 (two) times daily with a meal for 7 days.  Mild persistent asthma with exacerbation -     predniSONE (DELTASONE) 20 MG tablet; Take 1 tablet (20 mg total) by mouth 2 (two) times daily with a meal for 7 days.   I am having Andrea Daniels start on fluticasone, cetirizine, and predniSONE. I am also having her maintain her loratadine-pseudoephedrine, montelukast, diclofenac, budesonide-formoterol, and albuterol.  Meds ordered this encounter  Medications  . fluticasone (FLONASE) 50 MCG/ACT nasal spray    Sig: Place 2 sprays into both nostrils daily.    Dispense:  16 g    Refill:  6  . cetirizine (ZYRTEC) 10 MG tablet    Sig: Take 1 tablet (10 mg total) by mouth at bedtime.    Dispense:  30 tablet    Refill:  11  . predniSONE (DELTASONE) 20 MG tablet    Sig: Take 1 tablet (20 mg total) by mouth 2 (two) times daily with a meal for 7 days.    Dispense:  14 tablet    Refill:  0   Will use a brief course of prednisone while the Flonase is gaining effect.  Follow-up as needed.  Follow-up: Return in about 1 week (around 09/25/2017), or if symptoms worsen or fail to improve.  Mliss Sax, MD

## 2017-10-16 ENCOUNTER — Emergency Department (HOSPITAL_BASED_OUTPATIENT_CLINIC_OR_DEPARTMENT_OTHER)
Admission: EM | Admit: 2017-10-16 | Discharge: 2017-10-16 | Disposition: A | Discharge status: Home / Self Care | Payer: Managed Care, Other (non HMO) | Attending: Emergency Medicine | Admitting: Emergency Medicine

## 2017-10-16 ENCOUNTER — Other Ambulatory Visit: Payer: Self-pay

## 2017-10-16 ENCOUNTER — Emergency Department (HOSPITAL_BASED_OUTPATIENT_CLINIC_OR_DEPARTMENT_OTHER): Payer: Managed Care, Other (non HMO)

## 2017-10-16 ENCOUNTER — Encounter (HOSPITAL_BASED_OUTPATIENT_CLINIC_OR_DEPARTMENT_OTHER): Payer: Self-pay | Admitting: Emergency Medicine

## 2017-10-16 DIAGNOSIS — I10 Essential (primary) hypertension: Secondary | ICD-10-CM | POA: Insufficient documentation

## 2017-10-16 DIAGNOSIS — J45901 Unspecified asthma with (acute) exacerbation: Secondary | ICD-10-CM | POA: Diagnosis not present

## 2017-10-16 DIAGNOSIS — Z87891 Personal history of nicotine dependence: Secondary | ICD-10-CM | POA: Insufficient documentation

## 2017-10-16 DIAGNOSIS — R0602 Shortness of breath: Secondary | ICD-10-CM | POA: Diagnosis present

## 2017-10-16 DIAGNOSIS — Z79899 Other long term (current) drug therapy: Secondary | ICD-10-CM | POA: Insufficient documentation

## 2017-10-16 LAB — BASIC METABOLIC PANEL
Anion gap: 9 (ref 5–15)
BUN: 7 mg/dL (ref 6–20)
CO2: 21 mmol/L — ABNORMAL LOW (ref 22–32)
Calcium: 8.3 mg/dL — ABNORMAL LOW (ref 8.9–10.3)
Chloride: 105 mmol/L (ref 101–111)
Creatinine, Ser: 0.85 mg/dL (ref 0.44–1.00)
GFR calc Af Amer: >60 mL/min (ref >60)
GFR calc non Af Amer: >60 mL/min (ref >60)
Glucose, Bld: 125 mg/dL — ABNORMAL HIGH (ref 65–99)
Potassium: 3.3 mmol/L — ABNORMAL LOW (ref 3.5–5.1)
Sodium: 135 mmol/L (ref 135–145)

## 2017-10-16 LAB — CBC WITH DIFFERENTIAL/PLATELET
Basophils Absolute: 0.1 10*3/uL (ref 0.0–0.1)
Basophils Relative: 1 %
Eosinophils Absolute: 0.4 10*3/uL (ref 0.0–0.7)
Eosinophils Relative: 8 %
HCT: 39 % (ref 36.0–46.0)
Hemoglobin: 13.9 g/dL (ref 12.0–15.0)
Lymphocytes Relative: 45 %
Lymphs Abs: 2 10*3/uL (ref 0.7–4.0)
MCH: 28.3 pg (ref 26.0–34.0)
MCHC: 35.6 g/dL (ref 30.0–36.0)
MCV: 79.4 fL (ref 78.0–100.0)
Monocytes Absolute: 0.2 10*3/uL (ref 0.1–1.0)
Monocytes Relative: 5 %
Neutro Abs: 1.8 10*3/uL (ref 1.7–7.7)
Neutrophils Relative %: 41 %
Platelets: 311 10*3/uL (ref 150–400)
RBC: 4.91 MIL/uL (ref 3.87–5.11)
RDW: 13.4 % (ref 11.5–15.5)
WBC: 4.3 10*3/uL (ref 4.0–10.5)

## 2017-10-16 MED ORDER — ALBUTEROL (5 MG/ML) CONTINUOUS INHALATION SOLN
15.0000 mg/h | INHALATION_SOLUTION | Freq: Once | RESPIRATORY_TRACT | Status: AC
  Administered 2017-10-16: 15 mg/h via RESPIRATORY_TRACT
  Filled 2017-10-16: qty 20

## 2017-10-16 MED ORDER — ALBUTEROL SULFATE (2.5 MG/3ML) 0.083% IN NEBU
2.5000 mg | INHALATION_SOLUTION | Freq: Once | RESPIRATORY_TRACT | Status: AC
  Administered 2017-10-16: 2.5 mg via RESPIRATORY_TRACT
  Filled 2017-10-16: qty 3

## 2017-10-16 MED ORDER — PREDNISONE 20 MG PO TABS: 40.0000 mg | ORAL_TABLET | Freq: Every day | ORAL | 0 refills | Status: DC

## 2017-10-16 MED ORDER — METHYLPREDNISOLONE SODIUM SUCC 125 MG IJ SOLR
125.0000 mg | Freq: Once | INTRAMUSCULAR | Status: AC
  Administered 2017-10-16: 125 mg via INTRAVENOUS
  Filled 2017-10-16: qty 2

## 2017-10-16 MED ORDER — IPRATROPIUM-ALBUTEROL 0.5-2.5 (3) MG/3ML IN SOLN
3.0000 mL | Freq: Once | RESPIRATORY_TRACT | Status: AC
  Administered 2017-10-16: 3 mL via RESPIRATORY_TRACT
  Filled 2017-10-16: qty 3

## 2017-10-16 MED ORDER — ALBUTEROL SULFATE HFA 108 (90 BASE) MCG/ACT IN AERS: 1.0000 | INHALATION_SPRAY | Freq: Four times a day (QID) | RESPIRATORY_TRACT | 0 refills | Status: DC | PRN

## 2017-10-16 MED ORDER — BUDESONIDE-FORMOTEROL FUMARATE 80-4.5 MCG/ACT IN AERO: 2.0000 | INHALATION_SPRAY | Freq: Every day | RESPIRATORY_TRACT | 3 refills | Status: DC

## 2017-10-16 MED ORDER — MAGNESIUM SULFATE 2 GM/50ML IV SOLN
2.0000 g | Freq: Once | INTRAVENOUS | Status: AC
  Administered 2017-10-16: 2 g via INTRAVENOUS
  Filled 2017-10-16: qty 50

## 2017-10-16 NOTE — ED Notes (Signed)
ED Provider at bedside. 

## 2017-10-16 NOTE — ED Notes (Signed)
Ambulated in hall on r/a HR 135-145, RR 20-24, SpO2 >96%.

## 2017-10-16 NOTE — ED Provider Notes (Signed)
[facility] EMERGENCY DEPARTMENT Provider Note   CSN: [id number] Arrival date & time: 10/16/17  1147     History   Chief Complaint Chief Complaint  Patient presents with  . Shortness of Breath    HPI Andrea Daniels is a 27 y.o. female.  The history is provided by the patient and medical records. No language interpreter was used.  Shortness of Breath  Associated symptoms include cough and wheezing. Pertinent negatives include no fever, no sore throat, no chest pain, no vomiting and no abdominal pain.   Andrea Daniels is a 27 y.o. female  with a PMH of asthma who presents to the Emergency Department complaining of shortness of breath.  Patient states that she has been struggling with seasonal allergies and viral URI symptoms for about 3 weeks.  She was seen by her primary care doctor on 3/25 at onset of symptoms.  He was started on Flonase and Zyrtec daily and given a short prednisone burst.  She took these medications and noticed some improvement, however over the last 2-3 days, her symptoms have progressively worsened.  She has started to wheeze much more severely and is getting a little improvement from her home albuterol inhaler.  She feels much more short of breath.  Denies any chest pain.  She also reports cough and congestion.  No known fever or chills.  She reports history of similar this time about every year.  Last year, she had to be admitted to the hospital for asthma exacerbation.   Past Medical History:  Diagnosis Date  . Asthma   . Gastritis   . Gastritis   . Hypertension   . Ovarian cyst   . UTI (lower urinary tract infection)     Patient Active Problem List   Diagnosis Date Noted  . Acute respiratory failure (HCC) 10/12/2016  . Acute respiratory failure with hypoxia (HCC) 10/11/2016  . Hypokalemia 10/11/2016  . Lobar pneumonia (HCC)   . CAP (community acquired pneumonia) 06/03/2016  . Epigastric abdominal pain   . Tachycardia 06/02/2016  .  Leukocytosis 06/02/2016  . Community acquired pneumonia of right lung 06/02/2016  . Asthma 06/02/2016  . Hypoxia   . Hypophosphatemia 03/20/2015  . Low TSH level 03/20/2015  . Metabolic acidosis 03/20/2015  . Microcytic anemia 03/20/2015  . Exacerbation of asthma 03/19/2015  . Elevated glucose 07/03/2014  . Asthma with exacerbation 10/22/2013  . Hives 07/11/2013  . Status asthmaticus 06/08/2013  . Nausea vomiting and diarrhea 08/16/2012  . Allergic rhinitis 04/24/2012  . Preventative health care 08/14/2011    Past Surgical History:  Procedure Laterality Date  . NO PAST SURGERIES       OB History    Gravida  0   Para      Term      Preterm      AB      Living        SAB      TAB      Ectopic      Multiple      Live Births               Home Medications    Prior to Admission medications   Medication Sig Start Date End Date Taking? Authorizing Provider  albuterol (PROVENTIL HFA;VENTOLIN HFA) 108 (90 Base) MCG/ACT inhaler Inhale 1-2 puffs into the lungs every 6 (six) hours as needed for wheezing or shortness of breath. 10/16/17   Jyrah Blye, Chase Picket, PA-C  budesonide-formoterol [facility])  80-4.5 MCG/ACT inhaler Inhale 2 puffs into the lungs daily. 10/16/17   Shawn Dannenberg, Chase Picket, PA-C  cetirizine (ZYRTEC) 10 MG tablet Take 1 tablet (10 mg total) by mouth at bedtime. 09/18/17   Mliss Sax, MD  diclofenac (VOLTAREN) 75 MG EC tablet  08/11/17   [provider]  fluticasone (FLONASE) 50 MCG/ACT nasal spray Place 2 sprays into both nostrils daily. 09/18/17   Mliss Sax, MD  loratadine-pseudoephedrine (CLARITIN-D 12 HOUR) 5-120 MG tablet Take 1 tablet by mouth 2 (two) times daily. 04/24/17   McDonald, Mia A, PA-C  montelukast (SINGULAIR) 10 MG tablet Take 1 tablet (10 mg total) by mouth at bedtime. 04/24/17   McDonald, Mia A, PA-C  predniSONE (DELTASONE) 20 MG tablet Take 2 tablets (40 mg total) by mouth daily. 10/16/17   Jurnee Nakayama, Chase Picket, PA-C    Family History Family History  Problem Relation Age of Onset  . Arthritis Other   . Cancer Other        breast cancer  . Hypertension Other   . Asthma Other   . Hyperlipidemia Other   . Hypertension Other   . Diabetes Other   . Arthritis Mother   . Hearing loss Mother   . Hypertension Mother   . Arthritis Father   . Hypertension Father   . Early death Brother   . Arthritis Maternal Grandmother   . Asthma Maternal Grandmother   . Cancer Maternal Grandmother   . Diabetes Maternal Grandmother   . Hearing loss Maternal Grandmother   . Hypertension Maternal Grandmother   . Kidney disease Paternal Grandmother   . Stroke Paternal Grandfather     Social History Social History   Tobacco Use  . Smoking status: Former Smoker    Packs/day: 0.10    Types: Cigarettes  . Smokeless tobacco: Never Used  Substance Use Topics  . Alcohol use: Yes    Comment: Once a week   . Drug use: Yes    Types: Marijuana     Allergies   Patient has no known allergies.   Review of Systems Review of Systems  Constitutional: Negative for chills and fever.  HENT: Positive for congestion. Negative for sore throat.   Respiratory: Positive for cough, shortness of breath and wheezing.   Cardiovascular: Negative for chest pain.  Gastrointestinal: Negative for abdominal pain, nausea and vomiting.  All other systems reviewed and are negative.    Physical Exam Updated Vital Signs BP (!) 155/83 Comment: after ambulating  Pulse (!) 137 Comment: after ambulating  Temp 98.6 F (37 C) (Oral)   Resp (!) 27 Comment: after ambulating  Ht 4\' 11"  (1.499 m)   Wt 69.9 kg (154 lb)   LMP 10/16/2017   SpO2 98% Comment: after ambulating  BMI 31.10 kg/m   Physical Exam  Constitutional: She is oriented to person, place, and time. She appears well-developed and well-nourished. No distress.  HENT:  Head: Normocephalic and atraumatic.  Cardiovascular: Normal rate, regular rhythm and  normal heart sounds.  No murmur heard. Pulmonary/Chest:  Increased effort in breathing.  Tachypneic.  Inspiratory and expiratory wheezing bilaterally.  No crackles appreciated.  Abdominal: Soft. She exhibits no distension. There is no tenderness.  Musculoskeletal: She exhibits no edema.  Neurological: She is alert and oriented to person, place, and time.  Skin: Skin is warm and dry.  Nursing note and vitals reviewed.    ED Treatments / Results  Labs (all labs ordered are listed, but only abnormal results are  displayed) Labs Reviewed  BASIC METABOLIC PANEL - Abnormal; Notable for the following components:      Result Value   Potassium 3.3 (*)    CO2 21 (*)    Glucose, Bld 125 (*)    Calcium 8.3 (*)    All other components within normal limits  CBC WITH DIFFERENTIAL/PLATELET    EKG None  Radiology Dg Chest 2 View  Result Date: 10/16/2017 CLINICAL DATA:  Shortness of breath EXAM: CHEST - 2 VIEW COMPARISON:  06/01/2017 FINDINGS: Normal heart size and mediastinal contours. No acute infiltrate or edema. No effusion or pneumothorax. No acute osseous findings. IMPRESSION: Negative chest. Electronically Signed   By: Marnee SpringJonathon  Watts M.D.   On: 10/16/2017 12:39    Procedures Procedures (including critical care time)  CRITICAL CARE Performed by: Chase PicketJaime Pilcher Eulia Hatcher   Total critical care time: 35 minutes  Critical care time was exclusive of separately billable procedures and treating other patients.  Critical care was necessary to treat or prevent imminent or life-threatening deterioration.  Critical care was time spent personally by me on the following activities: development of treatment plan with patient and/or surrogate as well as nursing, discussions with consultants, evaluation of patient's response to treatment, examination of patient, obtaining history from patient or surrogate, ordering and performing treatments and interventions, ordering and review of laboratory studies,  ordering and review of radiographic studies, pulse oximetry and re-evaluation of patient's condition.   Medications Ordered in ED Medications  ipratropium-albuterol (DUONEB) 0.5-2.5 (3) MG/3ML nebulizer solution 3 mL (3 mLs Nebulization Given 10/16/17 1156)  albuterol (PROVENTIL) (2.5 MG/3ML) 0.083% nebulizer solution 2.5 mg (2.5 mg Nebulization Given 10/16/17 1156)  methylPREDNISolone sodium succinate (SOLU-MEDROL) 125 mg/2 mL injection 125 mg (125 mg Intravenous Given 10/16/17 1207)  albuterol (PROVENTIL,VENTOLIN) solution continuous neb (15 mg/hr Nebulization Given 10/16/17 1228)  magnesium sulfate IVPB 2 g 50 mL (0 g Intravenous Stopped 10/16/17 1339)     Initial Impression / Assessment and Plan / ED Course  I have reviewed the triage vital signs and the nursing notes.  Pertinent labs & imaging results that were available during my care of the patient were reviewed by me and considered in my medical decision making (see chart for details).    Carey Bullocksia L Hairston is a 27 y.o. female who presents to ED for shortness of breath consistent with asthma exacerbation.  Increased effort of breathing, tachypneic with respiratory and expiratory wheezing bilaterally.  Given Solu-Medrol and started on breathing treatment.  Will monitor closely.  Patient reevaluated after first neb treatment with minimal improvement.  She does not feel much improved either.  Will place on continuous and start magnesium.  CXR with no acute findings. No PNA.  Labs with mild hypokalemia at 3.3. Normal white count.   Patient re-evaluated following treatment. Feels improved. She was able to ambulate in ED and maintain O2 sats >95%. She would like to go home and feels comfortable doing so. Will tx with steroid burst and refill inhalers. Low threshold to return if symptoms worsen. She understands this. PCP follow up encouraged. All questions answered.    Patient discussed with Dr. Ranae PalmsYelverton who agrees with treatment plan.    Final Clinical Impressions(s) / ED Diagnoses   Final diagnoses:  Moderate asthma with exacerbation, unspecified whether persistent    ED Discharge Orders        Ordered    predniSONE (DELTASONE) 20 MG tablet  Daily     10/16/17 1406    albuterol (PROVENTIL HFA;VENTOLIN HFA)  108 (90 Base) MCG/ACT inhaler  Every 6 hours PRN     10/16/17 1406    budesonide-formoterol (SYMBICORT) 80-4.5 MCG/ACT inhaler  Daily     10/16/17 1406       Jennessy Sandridge, Chase Picket, PA-C 10/16/17 1408    Loren Racer, MD 10/16/17 1507

## 2017-10-16 NOTE — ED Notes (Signed)
Pt directed to pharmacy to pick up Rx. Ambulatory to d/c window. Pt has family with her at time of d/c

## 2017-10-16 NOTE — Discharge Instructions (Signed)
It was my pleasure taking care of you today!   Take prednisone daily starting tomorrow morning.  I have given you a refill of your home inhalers.   Follow up with your primary care provider for further discussion of today's ER visit.   If you develop any new or worsening symptoms, including but not limited to fever, persistent vomiting, worsening shortness of breath or other symptoms that concern you, please return to the Emergency Department immediately.

## 2017-10-16 NOTE — ED Triage Notes (Signed)
Patient states that she has a hx of asthma and has had increased WOB and SOB x 2 -3 days

## 2017-11-21 ENCOUNTER — Ambulatory Visit: Payer: Managed Care, Other (non HMO) | Admitting: Family Medicine

## 2017-11-23 ENCOUNTER — Ambulatory Visit: Payer: Managed Care, Other (non HMO) | Admitting: Family Medicine

## 2017-12-17 ENCOUNTER — Encounter (HOSPITAL_COMMUNITY): Payer: Self-pay | Admitting: Emergency Medicine

## 2017-12-17 ENCOUNTER — Emergency Department (HOSPITAL_COMMUNITY): Payer: Managed Care, Other (non HMO)

## 2017-12-17 ENCOUNTER — Emergency Department (HOSPITAL_COMMUNITY)
Admission: EM | Admit: 2017-12-17 | Discharge: 2017-12-17 | Disposition: A | Discharge status: Home / Self Care | Payer: Managed Care, Other (non HMO) | Attending: Emergency Medicine | Admitting: Emergency Medicine

## 2017-12-17 DIAGNOSIS — N946 Dysmenorrhea, unspecified: Secondary | ICD-10-CM | POA: Diagnosis present

## 2017-12-17 DIAGNOSIS — Z79899 Other long term (current) drug therapy: Secondary | ICD-10-CM | POA: Diagnosis not present

## 2017-12-17 DIAGNOSIS — N739 Female pelvic inflammatory disease, unspecified: Secondary | ICD-10-CM | POA: Diagnosis not present

## 2017-12-17 DIAGNOSIS — J45909 Unspecified asthma, uncomplicated: Secondary | ICD-10-CM | POA: Diagnosis not present

## 2017-12-17 DIAGNOSIS — N730 Acute parametritis and pelvic cellulitis: Secondary | ICD-10-CM

## 2017-12-17 DIAGNOSIS — N83292 Other ovarian cyst, left side: Secondary | ICD-10-CM | POA: Diagnosis not present

## 2017-12-17 DIAGNOSIS — N83202 Unspecified ovarian cyst, left side: Secondary | ICD-10-CM | POA: Diagnosis present

## 2017-12-17 DIAGNOSIS — R1030 Lower abdominal pain, unspecified: Secondary | ICD-10-CM | POA: Diagnosis not present

## 2017-12-17 DIAGNOSIS — I10 Essential (primary) hypertension: Secondary | ICD-10-CM | POA: Insufficient documentation

## 2017-12-17 DIAGNOSIS — Z87891 Personal history of nicotine dependence: Secondary | ICD-10-CM | POA: Diagnosis not present

## 2017-12-17 LAB — URINALYSIS, ROUTINE W REFLEX MICROSCOPIC
Bacteria, UA: NONE SEEN
Bilirubin Urine: NEGATIVE
Glucose, UA: NEGATIVE mg/dL
Ketones, ur: NEGATIVE mg/dL
Leukocytes, UA: NEGATIVE
Nitrite: NEGATIVE
Protein, ur: 100 mg/dL — AB
RBC / HPF: >50 RBC/hpf — ABNORMAL HIGH (ref 0–5)
Specific Gravity, Urine: 1.025 (ref 1.005–1.030)
pH: 9 — ABNORMAL HIGH (ref 5.0–8.0)

## 2017-12-17 LAB — COMPREHENSIVE METABOLIC PANEL
ALT: 14 U/L (ref 14–54)
AST: 21 U/L (ref 15–41)
Albumin: 3.9 g/dL (ref 3.5–5.0)
Alkaline Phosphatase: 58 U/L (ref 38–126)
Anion gap: 7 (ref 5–15)
BUN: 5 mg/dL — ABNORMAL LOW (ref 6–20)
CO2: 24 mmol/L (ref 22–32)
Calcium: 8.8 mg/dL — ABNORMAL LOW (ref 8.9–10.3)
Chloride: 109 mmol/L (ref 101–111)
Creatinine, Ser: 0.92 mg/dL (ref 0.44–1.00)
GFR calc Af Amer: >60 mL/min (ref >60)
GFR calc non Af Amer: >60 mL/min (ref >60)
Glucose, Bld: 118 mg/dL — ABNORMAL HIGH (ref 65–99)
Potassium: 3.8 mmol/L (ref 3.5–5.1)
Sodium: 140 mmol/L (ref 135–145)
Total Bilirubin: 1.1 mg/dL (ref 0.3–1.2)
Total Protein: 7.5 g/dL (ref 6.5–8.1)

## 2017-12-17 LAB — WET PREP, GENITAL
Clue Cells Wet Prep HPF POC: NONE SEEN
Sperm: NONE SEEN
Trich, Wet Prep: NONE SEEN
Yeast Wet Prep HPF POC: NONE SEEN

## 2017-12-17 LAB — CBC
HCT: 40.2 % (ref 36.0–46.0)
Hemoglobin: 13.4 g/dL (ref 12.0–15.0)
MCH: 27.1 pg (ref 26.0–34.0)
MCHC: 33.3 g/dL (ref 30.0–36.0)
MCV: 81.4 fL (ref 78.0–100.0)
Platelets: 357 10*3/uL (ref 150–400)
RBC: 4.94 MIL/uL (ref 3.87–5.11)
RDW: 12.5 % (ref 11.5–15.5)
WBC: 5.2 10*3/uL (ref 4.0–10.5)

## 2017-12-17 LAB — I-STAT BETA HCG BLOOD, ED (MC, WL, AP ONLY): I-stat hCG, quantitative: <5 m[IU]/mL (ref <5)

## 2017-12-17 MED ORDER — ACETAMINOPHEN 500 MG PO TABS
1000.0000 mg | ORAL_TABLET | Freq: Once | ORAL | Status: AC
  Administered 2017-12-17: 1000 mg via ORAL
  Filled 2017-12-17: qty 2

## 2017-12-17 MED ORDER — MELOXICAM 7.5 MG PO TABS: 7.5000 mg | ORAL_TABLET | Freq: Every day | ORAL | 0 refills | Status: AC

## 2017-12-17 MED ORDER — LIDOCAINE HCL (PF) 1 % IJ SOLN
INTRAMUSCULAR | Status: AC
  Administered 2017-12-17: 0.9 mL
  Filled 2017-12-17: qty 5

## 2017-12-17 MED ORDER — DOXYCYCLINE HYCLATE 100 MG PO CAPS: 100.0000 mg | ORAL_CAPSULE | Freq: Two times a day (BID) | ORAL | 0 refills | Status: AC

## 2017-12-17 MED ORDER — KETOROLAC TROMETHAMINE 30 MG/ML IJ SOLN
15.0000 mg | Freq: Once | INTRAMUSCULAR | Status: AC
  Administered 2017-12-17: 15 mg via INTRAVENOUS
  Filled 2017-12-17: qty 1

## 2017-12-17 MED ORDER — ONDANSETRON HCL 4 MG/2ML IJ SOLN
4.0000 mg | Freq: Once | INTRAMUSCULAR | Status: AC
  Administered 2017-12-17: 4 mg via INTRAVENOUS
  Filled 2017-12-17: qty 2

## 2017-12-17 MED ORDER — METRONIDAZOLE 500 MG PO TABS: 500.0000 mg | ORAL_TABLET | Freq: Two times a day (BID) | ORAL | 0 refills | Status: AC

## 2017-12-17 MED ORDER — IOHEXOL 300 MG/ML  SOLN
100.0000 mL | Freq: Once | INTRAMUSCULAR | Status: AC | PRN
  Administered 2017-12-17: 100 mL via INTRAVENOUS

## 2017-12-17 MED ORDER — MORPHINE SULFATE (PF) 4 MG/ML IV SOLN
4.0000 mg | Freq: Once | INTRAVENOUS | Status: AC
  Administered 2017-12-17: 4 mg via INTRAVENOUS
  Filled 2017-12-17: qty 1

## 2017-12-17 MED ORDER — ONDANSETRON 4 MG PO TBDP: 4.0000 mg | ORAL_TABLET | Freq: Three times a day (TID) | ORAL | 0 refills | Status: DC | PRN

## 2017-12-17 MED ORDER — CEFTRIAXONE SODIUM 250 MG IJ SOLR
250.0000 mg | Freq: Once | INTRAMUSCULAR | Status: AC
  Administered 2017-12-17: 250 mg via INTRAMUSCULAR
  Filled 2017-12-17: qty 250

## 2017-12-17 NOTE — ED Notes (Signed)
Pt given sample cup for urine  

## 2017-12-17 NOTE — ED Triage Notes (Addendum)
Pt loudly moaning throughout entire triage. Pt here for LLQ abdominal pain for 2 days with onset of menstrual cycle. Denies urinary symptoms or N V D.

## 2017-12-17 NOTE — ED Provider Notes (Signed)
[facility] EMERGENCY DEPARTMENT Provider Note   CSN: [id number] Arrival date & time: 12/17/17  1508     History   Chief Complaint Chief Complaint  Patient presents with  . Dysmenorrhea    HPI Andrea Daniels is a 27 y.o. female with a past medical history of ovarian cysts, dysmenorrhea, who presents today for evaluation of right lower quadrant abdominal pain.  She reports that this pain started 2 days ago with the onset of her menstrual cycle.  She has previously had potential cycles, however she is concerned based on her history of ovarian cyst that this may be something else.  She denies any urinary symptoms.  Pain is worse in the right lower quadrant however is generalized.  She reports nausea without vomiting.  She has not tried anything for her pain prior to arrival.  HPI  Past Medical History:  Diagnosis Date  . Asthma   . Gastritis   . Gastritis   . Hypertension   . Ovarian cyst   . UTI (lower urinary tract infection)     Patient Active Problem List   Diagnosis Date Noted  . Cyst of left ovary 12/17/2017  . Acute respiratory failure (HCC) 10/12/2016  . Acute respiratory failure with hypoxia (HCC) 10/11/2016  . Hypokalemia 10/11/2016  . Lobar pneumonia (HCC)   . CAP (community acquired pneumonia) 06/03/2016  . Epigastric abdominal pain   . Tachycardia 06/02/2016  . Leukocytosis 06/02/2016  . Community acquired pneumonia of right lung 06/02/2016  . Asthma 06/02/2016  . Hypoxia   . Hypophosphatemia 03/20/2015  . Low TSH level 03/20/2015  . Metabolic acidosis 03/20/2015  . Microcytic anemia 03/20/2015  . Exacerbation of asthma 03/19/2015  . Elevated glucose 07/03/2014  . Asthma with exacerbation 10/22/2013  . Hives 07/11/2013  . Status asthmaticus 06/08/2013  . Nausea vomiting and diarrhea 08/16/2012  . Allergic rhinitis 04/24/2012  . Preventative health care 08/14/2011    Past Surgical History:  Procedure Laterality Date  . NO PAST  SURGERIES       OB History    Gravida  0   Para      Term      Preterm      AB      Living        SAB      TAB      Ectopic      Multiple      Live Births               Home Medications    Prior to Admission medications   Medication Sig Start Date End Date Taking? Authorizing Provider  albuterol (PROVENTIL HFA;VENTOLIN HFA) 108 (90 Base) MCG/ACT inhaler Inhale 1-2 puffs into the lungs every 6 (six) hours as needed for wheezing or shortness of breath. 10/16/17   Ward, Chase Picket, PA-C  budesonide-formoterol (SYMBICORT) 80-4.5 MCG/ACT inhaler Inhale 2 puffs into the lungs daily. 10/16/17   Ward, Chase Picket, PA-C  cetirizine (ZYRTEC) 10 MG tablet Take 1 tablet (10 mg total) by mouth at bedtime. 09/18/17   Mliss Sax, MD  diclofenac (VOLTAREN) 75 MG EC tablet  08/11/17   [provider]  doxycycline (VIBRAMYCIN) 100 MG capsule Take 1 capsule (100 mg total) by mouth 2 (two) times daily for 14 days. 12/17/17 12/31/17  Cristina Gong, PA-C  fluticasone [facility]) 50 MCG/ACT nasal spray Place 2 sprays into both nostrils daily. 09/18/17   Mliss Sax, MD  loratadine-pseudoephedrine (CLARITIN-D 12  HOUR) 5-120 MG tablet Take 1 tablet by mouth 2 (two) times daily. 04/24/17   McDonald, Mia A, PA-C  meloxicam (MOBIC) 7.5 MG tablet Take 1-2 tablets (7.5-15 mg total) by mouth daily. 12/17/17   Cristina Gong, PA-C  metroNIDAZOLE (FLAGYL) 500 MG tablet Take 1 tablet (500 mg total) by mouth 2 (two) times daily for 14 days. 12/17/17 12/31/17  Cristina Gong, PA-C  montelukast (SINGULAIR) 10 MG tablet Take 1 tablet (10 mg total) by mouth at bedtime. 04/24/17   McDonald, Mia A, PA-C  ondansetron (ZOFRAN ODT) 4 MG disintegrating tablet Take 1 tablet (4 mg total) by mouth every 8 (eight) hours as needed for nausea or vomiting. 12/17/17   Cristina Gong, PA-C  predniSONE (DELTASONE) 20 MG tablet Take 2 tablets (40 mg total) by mouth daily.  10/16/17   Ward, Chase Picket, PA-C    Family History Family History  Problem Relation Age of Onset  . Arthritis Other   . Cancer Other        breast cancer  . Hypertension Other   . Asthma Other   . Hyperlipidemia Other   . Hypertension Other   . Diabetes Other   . Arthritis Mother   . Hearing loss Mother   . Hypertension Mother   . Arthritis Father   . Hypertension Father   . Early death Brother   . Arthritis Maternal Grandmother   . Asthma Maternal Grandmother   . Cancer Maternal Grandmother   . Diabetes Maternal Grandmother   . Hearing loss Maternal Grandmother   . Hypertension Maternal Grandmother   . Kidney disease Paternal Grandmother   . Stroke Paternal Grandfather     Social History Social History   Tobacco Use  . Smoking status: Former Smoker    Packs/day: 0.10    Types: Cigarettes  . Smokeless tobacco: Never Used  Substance Use Topics  . Alcohol use: Yes    Comment: Once a week   . Drug use: Yes    Types: Marijuana     Allergies   Patient has no known allergies.   Review of Systems Review of Systems  Constitutional: Negative for chills and fever.  Eyes: Negative for visual disturbance.  Respiratory: Negative for chest tightness.   Gastrointestinal: Positive for abdominal pain and nausea. Negative for diarrhea and vomiting.  Genitourinary: Positive for vaginal bleeding and vaginal pain. Negative for decreased urine volume, dysuria, urgency and vaginal discharge.  Neurological: Negative for headaches.  All other systems reviewed and are negative.    Physical Exam Updated Vital Signs BP (!) 144/93   Pulse 62   Temp 97.8 F (36.6 C) (Oral)   Resp 16   LMP 12/15/2017   SpO2 99%   Physical Exam  Constitutional: She appears well-developed and well-nourished. No distress.  HENT:  Head: Normocephalic and atraumatic.  Eyes: Conjunctivae are normal. Right eye exhibits no discharge. Left eye exhibits no discharge. No scleral icterus.  Neck:  Normal range of motion.  Cardiovascular: Normal rate and regular rhythm.  Pulmonary/Chest: Effort normal. No stridor. No respiratory distress.  Abdominal: Soft. Normal appearance and bowel sounds are normal. She exhibits no distension and no mass. There is tenderness in the right lower quadrant, suprapubic area and left lower quadrant. There is rebound and guarding.  Genitourinary: Vagina normal. Cervix exhibits motion tenderness and discharge. Right adnexum displays no mass, no tenderness and no fullness. Left adnexum displays mass, tenderness and fullness.  Genitourinary Comments: Exam performed with chaperone in room.  Musculoskeletal: She exhibits no edema or deformity.  Neurological: She is alert. She exhibits normal muscle tone.  Skin: Skin is warm and dry. She is not diaphoretic.  Psychiatric: She has a normal mood and affect. Her behavior is normal.  Nursing note and vitals reviewed.    ED Treatments / Results  Labs (all labs ordered are listed, but only abnormal results are displayed) Labs Reviewed  WET PREP, GENITAL - Abnormal; Notable for the following components:      Result Value   WBC, Wet Prep HPF POC FEW (*)    All other components within normal limits  URINALYSIS, ROUTINE W REFLEX MICROSCOPIC - Abnormal; Notable for the following components:   APPearance CLOUDY (*)    pH 9.0 (*)    Hgb urine dipstick MODERATE (*)    Protein, ur 100 (*)    RBC / HPF >50 (*)    All other components within normal limits  COMPREHENSIVE METABOLIC PANEL - Abnormal; Notable for the following components:   Glucose, Bld 118 (*)    BUN 5 (*)    Calcium 8.8 (*)    All other components within normal limits  CBC  I-STAT BETA HCG BLOOD, ED ([facility], [facility], [facility] ONLY)  GC/CHLAMYDIA PROBE AMP ([place]) NOT AT [facility]    EKG None  Radiology US Transvaginal Non-ob  Result Date: 12/17/2017 CLINICAL DATA:  Initial evaluation for acute left lower quadrant pain, ovarian cyst on prior CT. EXAM:  TRANSABDOMINAL AND TRANSVAGINAL ULTRASOUND OF PELVIS DOPPLER ULTRASOUND OF OVARIES TECHNIQUE: Both transabdominal and transvaginal ultrasound examinations of the pelvis were performed. Transabdominal technique was performed for global imaging of the pelvis including uterus, ovaries, adnexal regions, and pelvic cul-de-sac. It was necessary to proceed with endovaginal exam following the transabdominal exam to visualize the uterus, endometrium, ovaries. Color and duplex Doppler ultrasound was utilized to evaluate blood flow to the ovaries. COMPARISON:  Prior CT from earlier the same day. FINDINGS: Uterus Measurements: 10.0 x 3.9 x 4.5 cm. No fibroids or other mass visualized. Endometrium Thickness: 11.2 mm.  No focal abnormality visualized. Right ovary Measurements: 5.8 x 2.7 x 3.0 cm. Normal appearance/no adnexal mass. Left ovary Measurements: 9.5 x 6.2 x 6.2 cm. Complex hypoechoic cystic lesion measuring 8.8 x 6.1 x 5.2 cm. Internal low level echoes with lace-like architecture. No internal nodularity or vascularity. Pulsed Doppler evaluation of both ovaries demonstrates normal low-resistance arterial and venous waveforms. Other findings Trace free fluid within the pelvis. IMPRESSION: 1. 8.8 cm complex left ovarian cyst, imaging features of which favor a hemorrhagic cyst. Given size, a short interval follow-up ultrasound in 6-12 weeks to ensure resolution is recommended. 2. No evidence for ovarian torsion or other acute abnormality. Electronically Signed   By: Rise Mu M.D.   On: 12/17/2017 21:15   US Pelvis Complete  Result Date: 12/17/2017 CLINICAL DATA:  Initial evaluation for acute left lower quadrant pain, ovarian cyst on prior CT. EXAM: TRANSABDOMINAL AND TRANSVAGINAL ULTRASOUND OF PELVIS DOPPLER ULTRASOUND OF OVARIES TECHNIQUE: Both transabdominal and transvaginal ultrasound examinations of the pelvis were performed. Transabdominal technique was performed for global imaging of the pelvis  including uterus, ovaries, adnexal regions, and pelvic cul-de-sac. It was necessary to proceed with endovaginal exam following the transabdominal exam to visualize the uterus, endometrium, ovaries. Color and duplex Doppler ultrasound was utilized to evaluate blood flow to the ovaries. COMPARISON:  Prior CT from earlier the same day. FINDINGS: Uterus Measurements: 10.0 x 3.9 x 4.5 cm. No fibroids or other mass  visualized. Endometrium Thickness: 11.2 mm.  No focal abnormality visualized. Right ovary Measurements: 5.8 x 2.7 x 3.0 cm. Normal appearance/no adnexal mass. Left ovary Measurements: 9.5 x 6.2 x 6.2 cm. Complex hypoechoic cystic lesion measuring 8.8 x 6.1 x 5.2 cm. Internal low level echoes with lace-like architecture. No internal nodularity or vascularity. Pulsed Doppler evaluation of both ovaries demonstrates normal low-resistance arterial and venous waveforms. Other findings Trace free fluid within the pelvis. IMPRESSION: 1. 8.8 cm complex left ovarian cyst, imaging features of which favor a hemorrhagic cyst. Given size, a short interval follow-up ultrasound in 6-12 weeks to ensure resolution is recommended. 2. No evidence for ovarian torsion or other acute abnormality. Electronically Signed   By: Rise Mu M.D.   On: 12/17/2017 21:15   Ct Abdomen Pelvis W Contrast  Result Date: 12/17/2017 CLINICAL DATA:  Right lower quadrant abdominal pain and nausea since this morning. EXAM: CT ABDOMEN AND PELVIS WITH CONTRAST TECHNIQUE: Multidetector CT imaging of the abdomen and pelvis was performed using the standard protocol following bolus administration of intravenous contrast. CONTRAST:  OMNIPAQUE IOHEXOL 300 MG/ML  SOLN COMPARISON:  06/03/2016. Previous pelvic ultrasound examinations, the most recent dated 11/24/2014. FINDINGS: Lower chest: Minimal left basilar atelectasis. Hepatobiliary: No focal liver abnormality is seen. No gallstones, gallbladder wall thickening, or biliary dilatation.  Pancreas: Unremarkable. No pancreatic ductal dilatation or surrounding inflammatory changes. Spleen: Normal in size without focal abnormality. Adrenals/Urinary Tract: Adrenal glands are unremarkable. Kidneys are normal, without renal calculi, focal lesion, or hydronephrosis. Bladder is unremarkable. Stomach/Bowel: Stomach is within normal limits. Appendix appears normal. No evidence of bowel wall thickening, distention, or inflammatory changes. Vascular/Lymphatic: No significant vascular findings are present. No enlarged abdominal or pelvic lymph nodes. Reproductive: 8.1 x 5.5 cm left adnexal cyst. Previously, there was a 5.3 x 2.7 cm cyst at that location. No visible soft tissue component other than normal ovarian tissue. No abnormal enhancement. 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 The patient had a left hydrosalpinx and left ovarian cyst on an ultrasound dated 03/11/2014. Unremarkable uterus and right ovary. Other: No abdominal wall hernia or abnormality. No abdominopelvic ascites. Musculoskeletal: Normal appearing bones. IMPRESSION: 1. Enlarging left adnexal cystic area, currently measuring 8.1 x 5.5 cm. On the current examination, this appears to be arising from the left ovary. Correlation with an elective repeat pelvic ultrasound is recommended. 2. No acute abnormality. Specifically, the appendix has a normal appearance. Electronically Signed   By: Beckie Salts M.D.   On: 12/17/2017 18:54   [place] Art/ven Flow Abd Pelv Doppler  Result Date: 12/17/2017 CLINICAL DATA:  Initial evaluation for acute left lower quadrant pain, ovarian cyst on prior CT. EXAM: TRANSABDOMINAL AND TRANSVAGINAL ULTRASOUND OF PELVIS DOPPLER ULTRASOUND OF OVARIES TECHNIQUE: Both transabdominal and transvaginal ultrasound examinations of the pelvis were performed. Transabdominal technique was performed for global imaging of the pelvis including uterus, ovaries, adnexal regions, and  pelvic cul-de-sac. It was necessary to proceed with endovaginal exam following the transabdominal exam to visualize the uterus, endometrium, ovaries. Color and duplex Doppler ultrasound was utilized to evaluate blood flow to the ovaries. COMPARISON:  Prior CT from earlier the same day. FINDINGS: Uterus Measurements: 10.0 x 3.9 x 4.5 cm. No fibroids or other mass visualized. Endometrium Thickness: 11.2 mm.  No focal abnormality visualized. Right ovary Measurements: 5.8 x 2.7 x 3.0 cm. Normal appearance/no adnexal mass. Left ovary Measurements: 9.5 x 6.2 x 6.2 cm. Complex hypoechoic cystic lesion measuring 8.8  x 6.1 x 5.2 cm. Internal low level echoes with lace-like architecture. No internal nodularity or vascularity. Pulsed Doppler evaluation of both ovaries demonstrates normal low-resistance arterial and venous waveforms. Other findings Trace free fluid within the pelvis. IMPRESSION: 1. 8.8 cm complex left ovarian cyst, imaging features of which favor a hemorrhagic cyst. Given size, a short interval follow-up ultrasound in 6-12 weeks to ensure resolution is recommended. 2. No evidence for ovarian torsion or other acute abnormality. Electronically Signed   By: Rise MuBenjamin  McClintock M.D.   On: 12/17/2017 21:15    Procedures Procedures (including critical care time)  Medications Ordered in ED Medications  cefTRIAXone (ROCEPHIN) injection 250 mg (has no administration in time range)  lidocaine (PF) (XYLOCAINE) 1 % injection (has no administration in time range)  morphine 4 MG/ML injection 4 mg (4 mg Intravenous Given 12/17/17 1654)  ondansetron (ZOFRAN) injection 4 mg (4 mg Intravenous Given 12/17/17 1652)  iohexol (OMNIPAQUE) 300 MG/ML solution 100 mL (100 mLs Intravenous Contrast Given 12/17/17 1806)  ondansetron (ZOFRAN) injection 4 mg (4 mg Intravenous Given 12/17/17 1835)  acetaminophen (TYLENOL) tablet 1,000 mg (1,000 mg Oral Given 12/17/17 2129)  ketorolac (TORADOL) 30 MG/ML injection 15 mg (15 mg  Intravenous Given 12/17/17 2137)     Initial Impression / Assessment and Plan / ED Course  I have reviewed the triage vital signs and the nursing notes.  Pertinent labs & imaging results that were available during my care of the patient were reviewed by me and considered in my medical decision making (see chart for details).  Clinical Course as of Dec 18 2135  Wynelle LinkSun Dec 17, 2017  1905 Patient informed of results, agreeable to ultrasound.    [EH]    Clinical Course User Index [EH] Cristina GongHammond, Tenea Sens W, PA-C   Patient presents today for evaluation of pelvic pain. Discussed work-up options with the patient.  CT scan abdomen pelvis was performed with a large left-sided ovarian cyst found.  Pelvic exam was performed with left-sided adnexal tenderness and fullness, along with cervical motion tenderness.  Pelvic ultrasound was performed showing good blood flow to both ovaries with large left-sided cyst.  Patient was given written instructions from radiology on 1 to follow-up.  Labs were obtained and reviewed, she is not anemic, no cytosis, no significant electrolyte abnormalities.  Based on pelvic motion tenderness will treat patient as PID.  She is given Rocephin IM while in the department, along with home prescriptions for doxycycline and Flagyl.  She was advised not to drink while taking Flagyl.  Given [facility] with [facility], and PCP.  Return precautions were discussed, patient is hemodynamically stable, generally well-appearing, does not meet Sirs or sepsis criteria.  Discharged home.  Final Clinical Impressions(s) / ED Diagnoses   Final diagnoses:  PID (acute pelvic inflammatory disease)  Dysmenorrhea  Cyst of left ovary    ED Discharge Orders        Ordered    meloxicam (MOBIC) 7.5 MG tablet  Daily     12/17/17 2134    ondansetron (ZOFRAN ODT) 4 MG disintegrating tablet  Every 8 hours PRN     12/17/17 2134    doxycycline (VIBRAMYCIN) 100 MG capsule  2 times daily      12/17/17 2134    metroNIDAZOLE (FLAGYL) 500 MG tablet  2 times daily     12/17/17 2134       Cristina GongHammond, Skyelar Halliday W, PA-C 12/17/17 2140    Vanetta MuldersZackowski, Scott, MD 12/20/17 [id number]

## 2017-12-17 NOTE — Discharge Instructions (Addendum)
IMPRESSION: 1. 8.8 cm complex left ovarian cyst, imaging features of which favor a hemorrhagic cyst. Given size, a short interval follow-up ultrasound in 6-12 weeks to ensure resolution is recommended.   I have given you a prescription for Mobic (meloxicam) today.  Mobic is a NSAID medication and you should not take it with other NSAIDs.  Examples of other NSAIDS include motrin, ibuprofen, aleve, naproxen, and Voltaren.  Please monitor your bowel movements for dark, tarry, sticky stools. If you have any bowel movements like this you need to stop taking mobic and call your doctor as this may represent a stomach ulcer from taking NSAIDS.    Please take Tylenol (acetaminophen) to relieve your pain.  You may take tylenol, up to 1,000 mg (two extra strength pills).  Do not take more than 3,000 mg tylenol in a 24 hour period.  Please check all medication labels as many medications such as pain and cold medications may contain tylenol. Please do not drink alcohol while taking this medication.   Today your diagnosed with PID and received a prescription for metronidazole also known as Flagyl. It is very important that you do not consume any alcohol while taking this medication as it will cause you to become violently ill.  Doxycycline will make you more likely to sun burn.  Please make sure you stay covered when outside.    Today you received medications that may make you sleepy or impair your ability to make decisions.  For the next 24 hours please do not drive, operate heavy machinery, care for a small child with out another adult present, or perform any activities that may cause harm to you or someone else if you were to fall asleep or be impaired.   Today you have been treated for gonorrhea and chlamydia.  The test to determine if you have these will take a few days. They will only call you if your tests come back positive, no news is good news.

## 2017-12-17 NOTE — ED Notes (Signed)
Patient transported to CT 

## 2017-12-17 NOTE — ED Notes (Signed)
Patient transported to US 

## 2017-12-18 LAB — GC/CHLAMYDIA PROBE AMP (~~LOC~~) NOT AT ARMC
Chlamydia: NEGATIVE
Neisseria Gonorrhea: NEGATIVE

## 2018-01-17 ENCOUNTER — Encounter (HOSPITAL_COMMUNITY): Payer: Self-pay | Admitting: Emergency Medicine

## 2018-01-17 ENCOUNTER — Other Ambulatory Visit: Payer: Self-pay

## 2018-01-17 ENCOUNTER — Emergency Department (HOSPITAL_COMMUNITY)
Admission: EM | Admit: 2018-01-17 | Discharge: 2018-01-18 | Disposition: A | Discharge status: Home / Self Care | Payer: Self-pay | Attending: Emergency Medicine | Admitting: Emergency Medicine

## 2018-01-17 DIAGNOSIS — J45909 Unspecified asthma, uncomplicated: Secondary | ICD-10-CM | POA: Insufficient documentation

## 2018-01-17 DIAGNOSIS — I10 Essential (primary) hypertension: Secondary | ICD-10-CM | POA: Insufficient documentation

## 2018-01-17 DIAGNOSIS — Z87891 Personal history of nicotine dependence: Secondary | ICD-10-CM | POA: Insufficient documentation

## 2018-01-17 DIAGNOSIS — N926 Irregular menstruation, unspecified: Secondary | ICD-10-CM | POA: Insufficient documentation

## 2018-01-17 DIAGNOSIS — Z79899 Other long term (current) drug therapy: Secondary | ICD-10-CM | POA: Insufficient documentation

## 2018-01-17 LAB — COMPREHENSIVE METABOLIC PANEL
ALT: 14 U/L (ref 0–44)
AST: 20 U/L (ref 15–41)
Albumin: 4 g/dL (ref 3.5–5.0)
Alkaline Phosphatase: 49 U/L (ref 38–126)
Anion gap: 8 (ref 5–15)
BUN: 11 mg/dL (ref 6–20)
CO2: 22 mmol/L (ref 22–32)
Calcium: 9 mg/dL (ref 8.9–10.3)
Chloride: 106 mmol/L (ref 98–111)
Creatinine, Ser: 0.91 mg/dL (ref 0.44–1.00)
GFR calc Af Amer: >60 mL/min (ref >60)
GFR calc non Af Amer: >60 mL/min (ref >60)
Glucose, Bld: 82 mg/dL (ref 70–99)
Potassium: 4.1 mmol/L (ref 3.5–5.1)
Sodium: 136 mmol/L (ref 135–145)
Total Bilirubin: 0.6 mg/dL (ref 0.3–1.2)
Total Protein: 7.7 g/dL (ref 6.5–8.1)

## 2018-01-17 LAB — URINALYSIS, ROUTINE W REFLEX MICROSCOPIC
Bacteria, UA: NONE SEEN
Bilirubin Urine: NEGATIVE
Glucose, UA: NEGATIVE mg/dL
Ketones, ur: NEGATIVE mg/dL
Leukocytes, UA: NEGATIVE
Nitrite: NEGATIVE
Protein, ur: NEGATIVE mg/dL
Specific Gravity, Urine: 1.018 (ref 1.005–1.030)
pH: 6 (ref 5.0–8.0)

## 2018-01-17 LAB — CBC
HCT: 50.2 % — ABNORMAL HIGH (ref 36.0–46.0)
Hemoglobin: 16.4 g/dL — ABNORMAL HIGH (ref 12.0–15.0)
MCH: 27.2 pg (ref 26.0–34.0)
MCHC: 32.7 g/dL (ref 30.0–36.0)
MCV: 83.4 fL (ref 78.0–100.0)
Platelets: 246 10*3/uL (ref 150–400)
RBC: 6.02 MIL/uL — ABNORMAL HIGH (ref 3.87–5.11)
RDW: 12.5 % (ref 11.5–15.5)
WBC: 6.2 10*3/uL (ref 4.0–10.5)

## 2018-01-17 LAB — LIPASE, BLOOD: Lipase: 47 U/L (ref 11–51)

## 2018-01-17 LAB — I-STAT CG4 LACTIC ACID, ED: Lactic Acid, Venous: 0.8 mmol/L (ref 0.5–1.9)

## 2018-01-17 NOTE — ED Triage Notes (Addendum)
Patient reports low abdominal pain with nausea and spotting onset today , denies emesis or diarrhea , no fever or chills . She is hypertensive at triage .

## 2018-01-18 ENCOUNTER — Encounter: Payer: Self-pay | Admitting: Family Medicine

## 2018-01-18 DIAGNOSIS — J45909 Unspecified asthma, uncomplicated: Secondary | ICD-10-CM

## 2018-01-18 LAB — RAPID URINE DRUG SCREEN, HOSP PERFORMED
Amphetamines: NOT DETECTED
Barbiturates: NOT DETECTED
Benzodiazepines: NOT DETECTED
Cocaine: NOT DETECTED
Opiates: NOT DETECTED
Tetrahydrocannabinol: NOT DETECTED

## 2018-01-18 LAB — WET PREP, GENITAL
Clue Cells Wet Prep HPF POC: NONE SEEN
Sperm: NONE SEEN
Trich, Wet Prep: NONE SEEN
Yeast Wet Prep HPF POC: NONE SEEN

## 2018-01-18 MED ORDER — ALBUTEROL SULFATE HFA 108 (90 BASE) MCG/ACT IN AERS
1.0000 | INHALATION_SPRAY | Freq: Four times a day (QID) | RESPIRATORY_TRACT | 0 refills | Status: DC | PRN
Start: 2018-01-18 — End: 2018-02-09

## 2018-01-18 NOTE — ED Provider Notes (Signed)
[facility] Provider Note   CSN: [id number] Arrival date & time: 01/17/18  2221     History   Chief Complaint Chief Complaint  Patient presents with  . Abdominal Pain  . Hypertension    HPI Andrea Daniels is a 27 y.o. female.  Patient presents with lower abdominal pain described as sharp shooting pain that started around 5:00 pm yesterday (01/17/18). She reports irregular vaginal bleeding for the past 4 days with light flow. This would be her second menses this month. She denies history of menstrual irregularity. No dysuria, vaginal itching or odor. No fever or vomiting.    The history is provided by the patient. No language interpreter was used.    Past Medical History:  Diagnosis Date  . Asthma   . Gastritis   . Gastritis   . Hypertension   . Ovarian cyst   . UTI (lower urinary tract infection)     Patient Active Problem List   Diagnosis Date Noted  . Cyst of left ovary 12/17/2017  . Acute respiratory failure (HCC) 10/12/2016  . Acute respiratory failure with hypoxia (HCC) 10/11/2016  . Hypokalemia 10/11/2016  . Lobar pneumonia (HCC)   . CAP (community acquired pneumonia) 06/03/2016  . Epigastric abdominal pain   . Tachycardia 06/02/2016  . Leukocytosis 06/02/2016  . Community acquired pneumonia of right lung 06/02/2016  . Asthma 06/02/2016  . Hypoxia   . Hypophosphatemia 03/20/2015  . Low TSH level 03/20/2015  . Metabolic acidosis 03/20/2015  . Microcytic anemia 03/20/2015  . Exacerbation of asthma 03/19/2015  . Elevated glucose 07/03/2014  . Asthma with exacerbation 10/22/2013  . Hives 07/11/2013  . Status asthmaticus 06/08/2013  . Nausea vomiting and diarrhea 08/16/2012  . Allergic rhinitis 04/24/2012  . Preventative health care 08/14/2011    Past Surgical History:  Procedure Laterality Date  . NO PAST SURGERIES       OB History    Gravida  0   Para      Term      Preterm      AB      Living       SAB      TAB      Ectopic      Multiple      Live Births               Home Medications    Prior to Admission medications   Medication Sig Start Date End Date Taking? Authorizing Provider  albuterol (PROVENTIL HFA;VENTOLIN HFA) 108 (90 Base) MCG/ACT inhaler Inhale 1-2 puffs into the lungs every 6 (six) hours as needed for wheezing or shortness of breath. 10/16/17   Ward, Chase PicketJaime Pilcher, PA-C  budesonide-formoterol (SYMBICORT) 80-4.5 MCG/ACT inhaler Inhale 2 puffs into the lungs daily. 10/16/17   Ward, Chase PicketJaime Pilcher, PA-C  cetirizine (ZYRTEC) 10 MG tablet Take 1 tablet (10 mg total) by mouth at bedtime. 09/18/17   Mliss SaxKremer, William Alfred, MD  diclofenac (VOLTAREN) 75 MG EC tablet  08/11/17   [provider]  fluticasone (FLONASE) 50 MCG/ACT nasal spray Place 2 sprays into both nostrils daily. 09/18/17   Mliss SaxKremer, William Alfred, MD  loratadine-pseudoephedrine (CLARITIN-D 12 HOUR) 5-120 MG tablet Take 1 tablet by mouth 2 (two) times daily. 04/24/17   McDonald, Mia A, PA-C  meloxicam (MOBIC) 7.5 MG tablet Take 1-2 tablets (7.5-15 mg total) by mouth daily. 12/17/17   Cristina GongHammond, Elizabeth W, PA-C  montelukast (SINGULAIR) 10 MG tablet Take 1 tablet (10  mg total) by mouth at bedtime. 04/24/17   McDonald, Mia A, PA-C  ondansetron (ZOFRAN ODT) 4 MG disintegrating tablet Take 1 tablet (4 mg total) by mouth every 8 (eight) hours as needed for nausea or vomiting. 12/17/17   Cristina Gong, PA-C  predniSONE (DELTASONE) 20 MG tablet Take 2 tablets (40 mg total) by mouth daily. 10/16/17   Ward, Chase Picket, PA-C    Family History Family History  Problem Relation Age of Onset  . Arthritis Other   . Cancer Other        breast cancer  . Hypertension Other   . Asthma Other   . Hyperlipidemia Other   . Hypertension Other   . Diabetes Other   . Arthritis Mother   . Hearing loss Mother   . Hypertension Mother   . Arthritis Father   . Hypertension Father   . Early death Brother   .  Arthritis Maternal Grandmother   . Asthma Maternal Grandmother   . Cancer Maternal Grandmother   . Diabetes Maternal Grandmother   . Hearing loss Maternal Grandmother   . Hypertension Maternal Grandmother   . Kidney disease Paternal Grandmother   . Stroke Paternal Grandfather     Social History Social History   Tobacco Use  . Smoking status: Former Smoker    Packs/day: 0.10    Types: Cigarettes  . Smokeless tobacco: Never Used  Substance Use Topics  . Alcohol use: Yes    Comment: Once a week   . Drug use: Yes    Types: Marijuana     Allergies   Patient has no known allergies.   Review of Systems Review of Systems  Constitutional: Negative for chills and fever.  Respiratory: Negative.   Cardiovascular: Negative.   Gastrointestinal: Positive for abdominal pain. Negative for vomiting.  Genitourinary: Positive for menstrual problem and vaginal bleeding. Negative for dysuria and vaginal discharge.  Musculoskeletal: Negative.   Skin: Negative.   Neurological: Negative.      Physical Exam Updated Vital Signs BP (!) 166/107   Pulse 64   Temp 98.3 F (36.8 C) (Oral)   Resp 16   Ht 4\' 11"  (1.499 m)   LMP 12/27/2017   SpO2 100%   BMI 31.10 kg/m   Physical Exam  Constitutional: She is oriented to person, place, and time. She appears well-developed and well-nourished.  HENT:  Head: Normocephalic.  Neck: Normal range of motion. Neck supple.  Cardiovascular: Normal rate and regular rhythm.  Pulmonary/Chest: Effort normal and breath sounds normal.  Abdominal: Soft. Bowel sounds are normal. There is tenderness (Mild) in the suprapubic area. There is no rebound and no guarding.  Genitourinary: Uterus is not tender. Cervix exhibits no motion tenderness and no discharge. Right adnexum displays no tenderness. Left adnexum displays no tenderness. There is bleeding in the vagina. No tenderness in the vagina. No foreign body in the vagina. No vaginal discharge found.    Musculoskeletal: Normal range of motion.  Neurological: She is alert and oriented to person, place, and time. No cranial nerve deficit.  Skin: Skin is warm and dry. No rash noted.  Psychiatric: She has a normal mood and affect.     ED Treatments / Results  Labs (all labs ordered are listed, but only abnormal results are displayed) Labs Reviewed  CBC - Abnormal; Notable for the following components:      Result Value   RBC 6.02 (*)    Hemoglobin 16.4 (*)    HCT 50.2 (*)  All other components within normal limits  URINALYSIS, ROUTINE W REFLEX MICROSCOPIC - Abnormal; Notable for the following components:   Hgb urine dipstick SMALL (*)    All other components within normal limits  LIPASE, BLOOD  COMPREHENSIVE METABOLIC PANEL  RAPID URINE DRUG SCREEN, HOSP PERFORMED  I-STAT BETA HCG BLOOD, ED ([facility], [facility], [facility] ONLY)  I-STAT CG4 LACTIC ACID, ED    EKG None  Radiology No results found.  Procedures Procedures (including critical care time)  Medications Ordered in ED Medications - No data to display   Initial Impression / Assessment and Plan / ED Course  I have reviewed the triage vital signs and the nursing notes.  Pertinent labs & imaging results that were available during my care of the patient were reviewed by me and considered in my medical decision making (see chart for details).     Patient here with irregular vaginal bleeding and lower abdominal pain felt associated with the irregular period. No evidence of infection. Not pregnant. VSS, normal HGB.   She is felt appropriate for discharge home with GYN follow up if symptoms persist or irregularity becomes frequent.  Final Clinical Impressions(s) / ED Diagnoses   Final diagnoses:  None   1. Irregular vaginal bleeding  ED Discharge Orders    None       Elpidio Anis, PA-C 01/18/18 0414    Palumbo, April, MD 01/18/18 1610

## 2018-01-18 NOTE — Discharge Instructions (Addendum)
[facility] or with a gynecologist of your choice for further evaluation if bleeding continues. Return here as needed.

## 2018-01-18 NOTE — ED Notes (Signed)
Patient verbalizes understanding of medications and discharge instructions. No further questions at this time. VSS and patient ambulatory at discharge.   

## 2018-01-19 LAB — GC/CHLAMYDIA PROBE AMP (~~LOC~~) NOT AT ARMC
Chlamydia: NEGATIVE
Neisseria Gonorrhea: NEGATIVE

## 2018-02-02 ENCOUNTER — Encounter: Payer: Self-pay | Admitting: Family Medicine

## 2018-02-06 ENCOUNTER — Ambulatory Visit: Payer: Self-pay | Admitting: Family Medicine

## 2018-02-09 ENCOUNTER — Other Ambulatory Visit: Payer: Self-pay

## 2018-02-09 DIAGNOSIS — J45909 Unspecified asthma, uncomplicated: Secondary | ICD-10-CM

## 2018-02-09 MED ORDER — ALBUTEROL SULFATE HFA 108 (90 BASE) MCG/ACT IN AERS: 1.0000 | INHALATION_SPRAY | Freq: Four times a day (QID) | RESPIRATORY_TRACT | 2 refills | Status: DC | PRN

## 2018-02-09 MED ORDER — BUDESONIDE-FORMOTEROL FUMARATE 80-4.5 MCG/ACT IN AERO: 2.0000 | INHALATION_SPRAY | Freq: Every day | RESPIRATORY_TRACT | 2 refills | Status: DC

## 2018-02-10 IMAGING — CR DG CHEST 1V PORT
1 series · 1 of 1 positions shown · non-contrast
Comparison: Earlier same day.

CLINICAL DATA: Acute respiratory failure.

EXAM:
PORTABLE CHEST 1 VIEW

[AP]
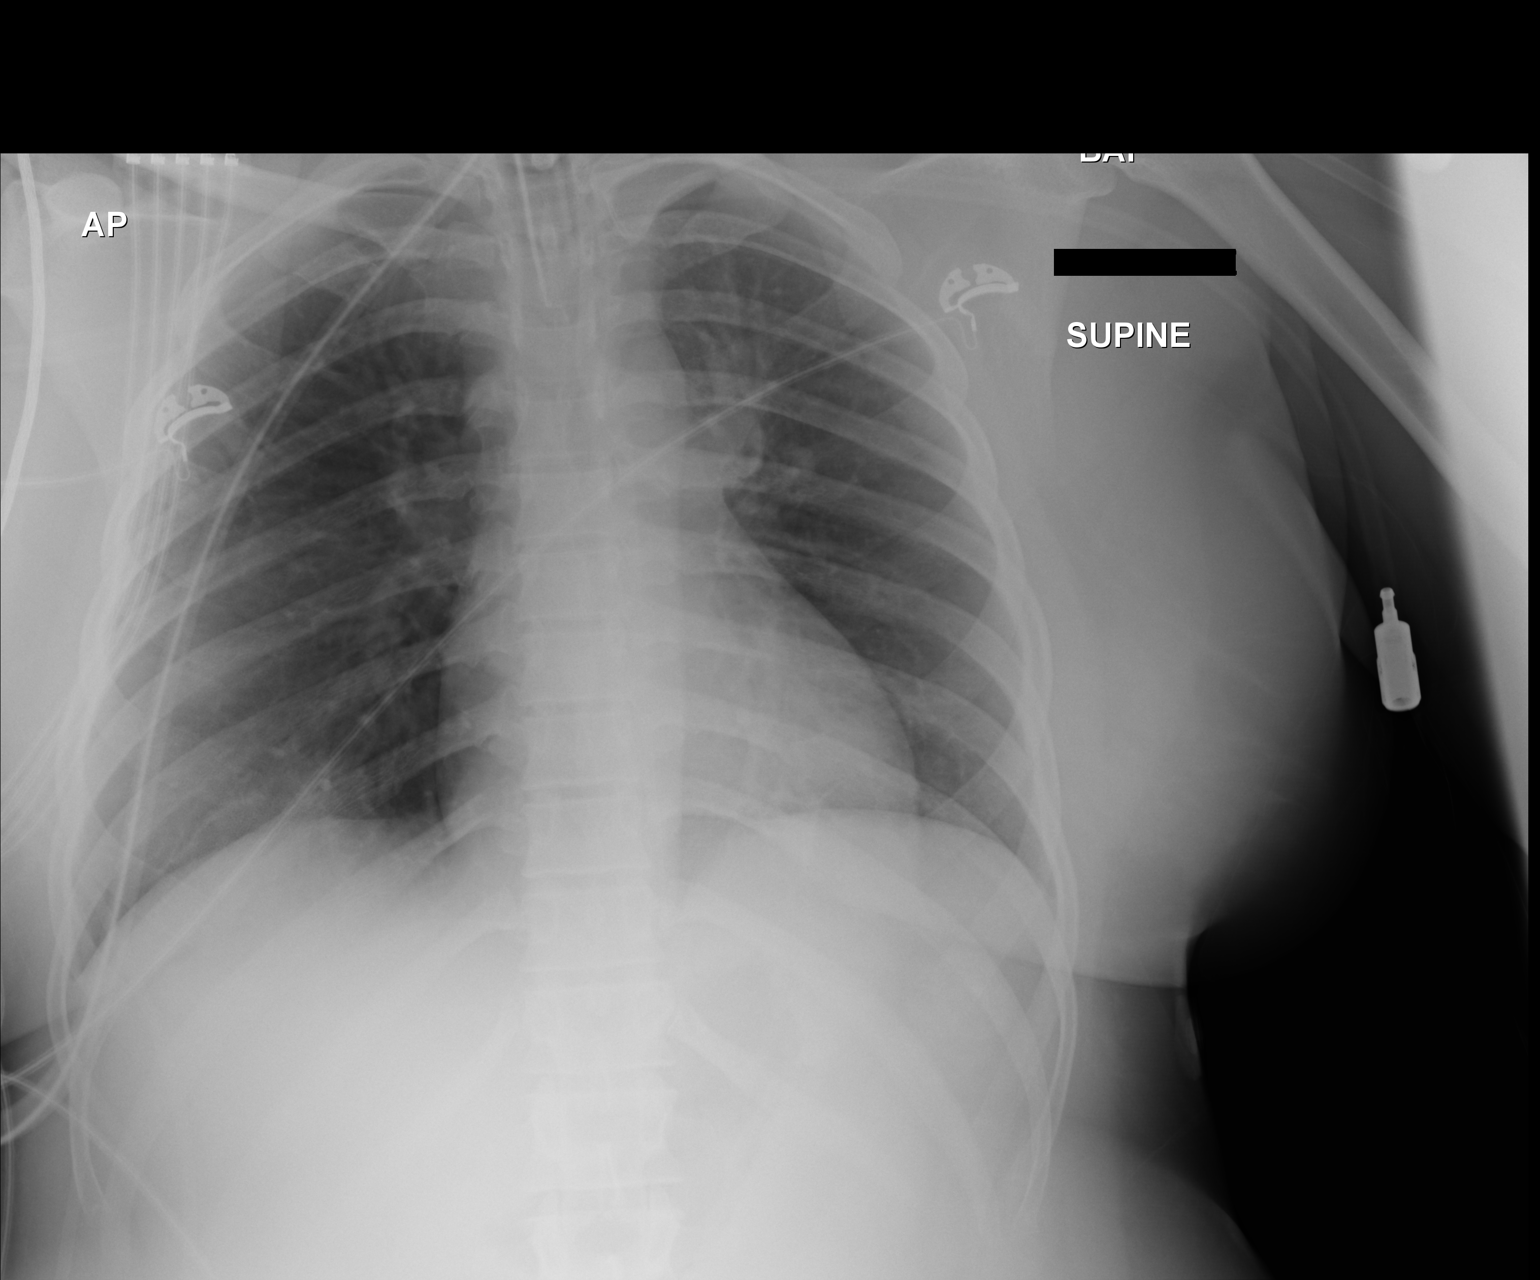

[1 of 1 positions shown; findings below may reference images not displayed]

FINDINGS: Endotracheal tube has tip 3.7 cm above the carina. Lungs are
adequately inflated with persistent hazy opacification over the
right base slightly improved. No effusion or pneumothorax.
Cardiomediastinal silhouette and remainder of the exam is unchanged.
IMPRESSION: Interval improvement in mild opacification over the right base.

Endotracheal tube with tip 3.7 cm above the carina.

## 2018-02-10 IMAGING — CR DG ABD PORTABLE 1V
1 series · 1 of 1 positions shown · non-contrast
Comparison: CT 06/03/2016

CLINICAL DATA: Enteric tube placement.

EXAM:
PORTABLE ABDOMEN - 1 VIEW

[AP]
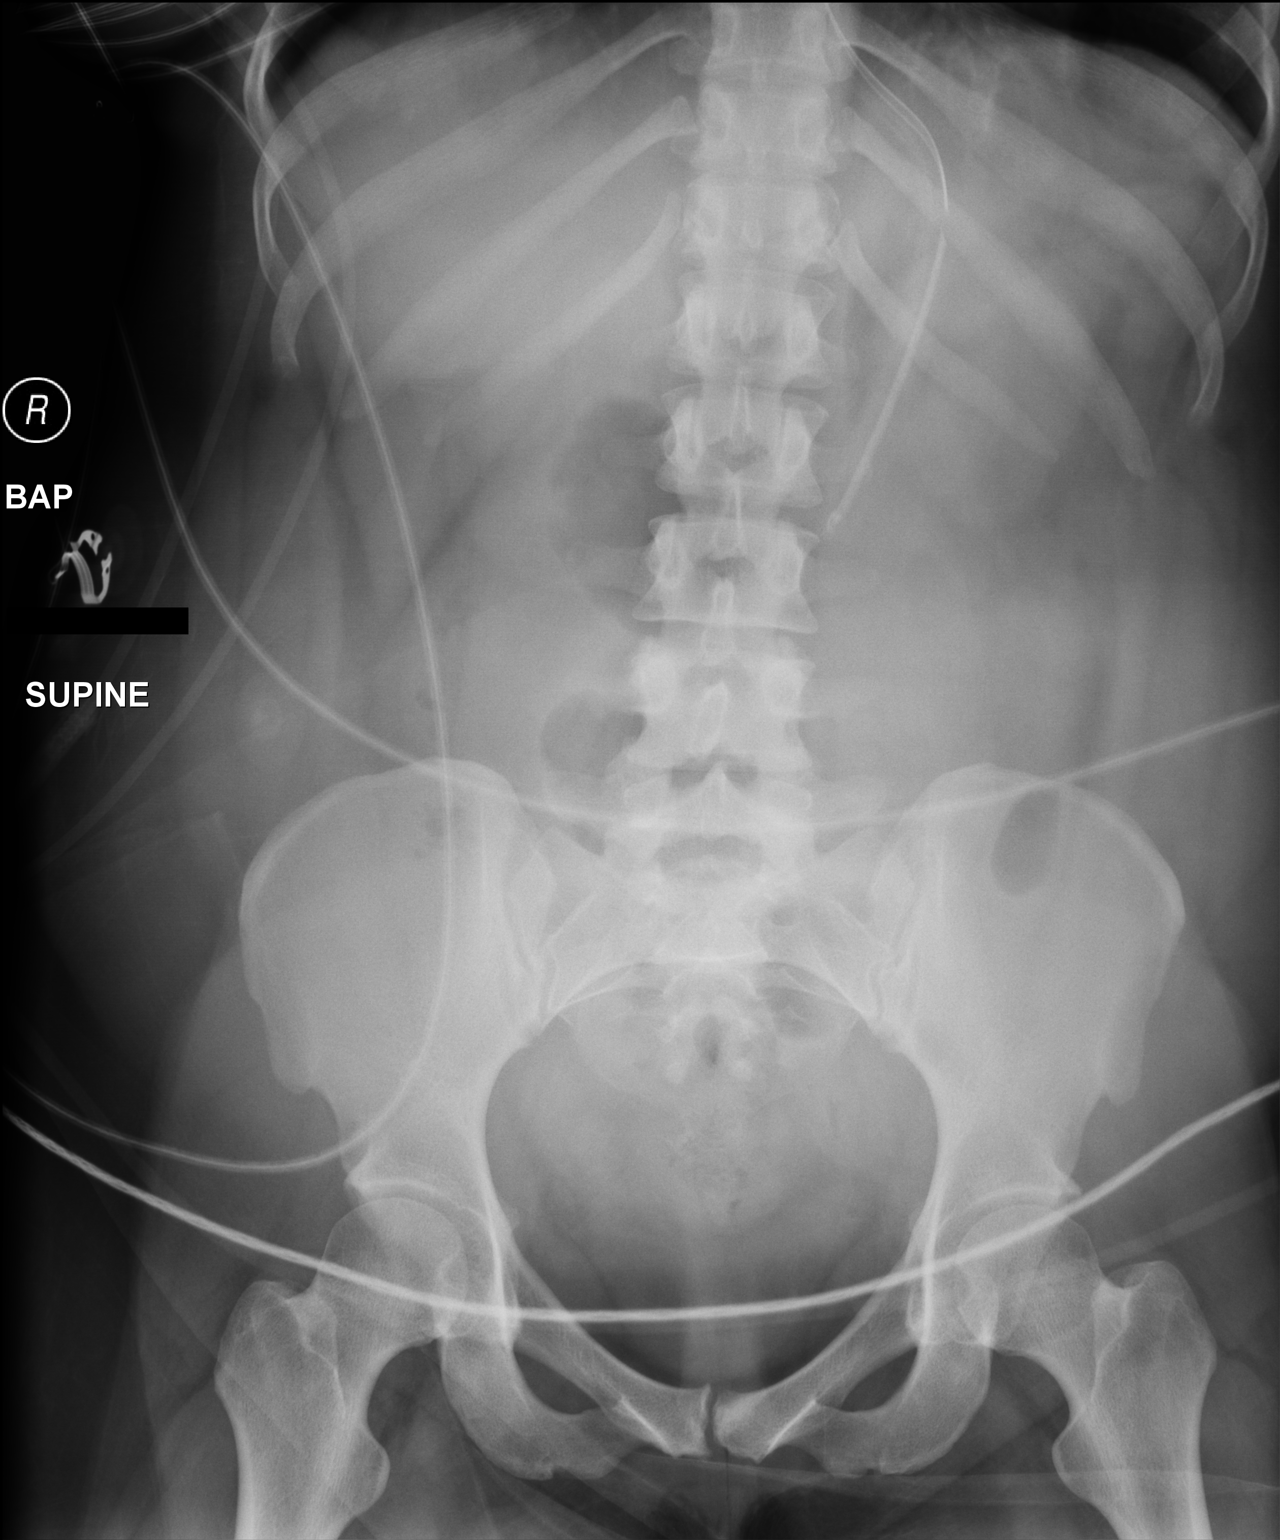

[1 of 1 positions shown; findings below may reference images not displayed]

FINDINGS: Enteric tube is present with tip over the mid abdomen just left of
midline likely over the mid to distal stomach. Bowel gas pattern is
nonobstructive. Bones and soft tissues are within normal.
IMPRESSION: Nonobstructive bowel gas pattern.

Enteric tube with tip over the left mid abdomen likely in the mid to
distal stomach.

## 2018-02-11 IMAGING — CR DG ABD PORTABLE 1V
1 series · 1 of 1 positions shown · non-contrast
Comparison: 10/11/2016

CLINICAL DATA: Feeding tube placement.

EXAM:
PORTABLE ABDOMEN - 1 VIEW

[AP]
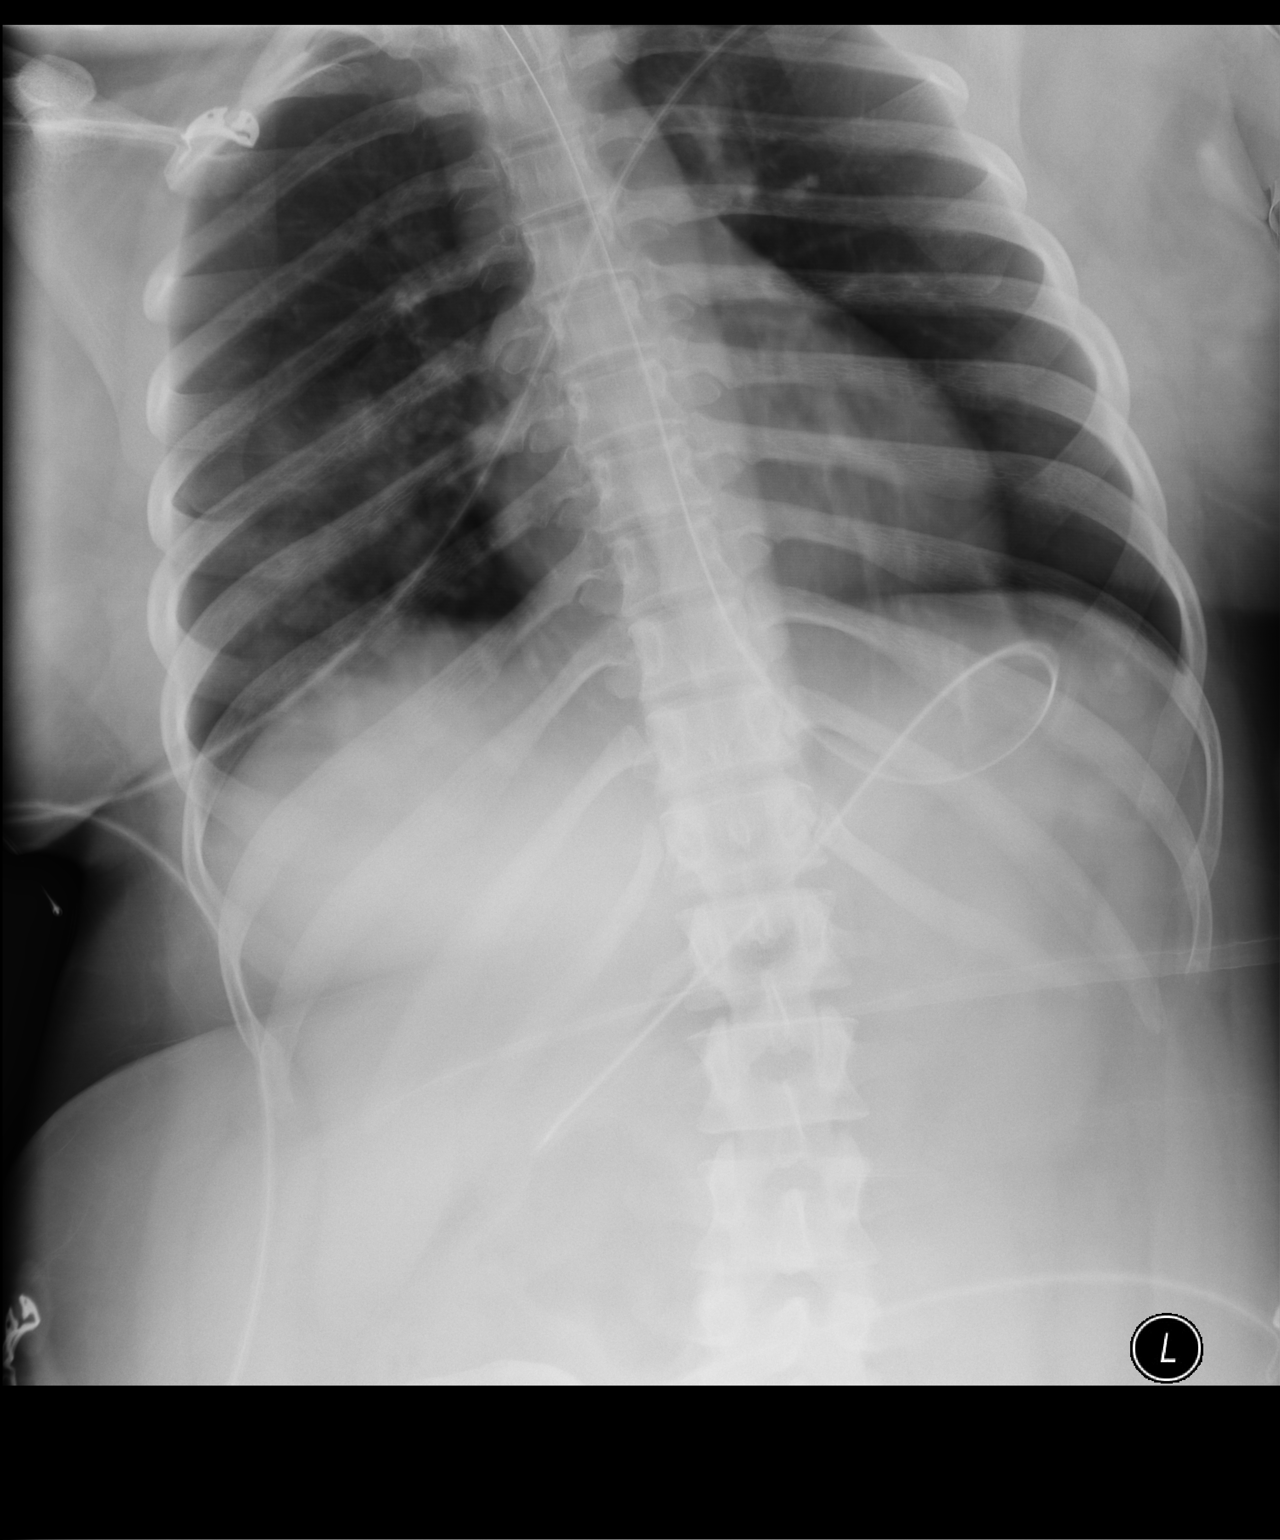

[1 of 1 positions shown; findings below may reference images not displayed]

FINDINGS: Enteric tube tip is in the right upper quadrant consistent with
location in the distal stomach. Motion artifact limits examination
but suggestion of infiltration in the right lung base. No gaseous
distention of bowel in the upper abdomen.
IMPRESSION: Enteric tube tip is in the right upper quadrant consistent with
location in the distal stomach.

## 2018-02-12 ENCOUNTER — Ambulatory Visit: Payer: Self-pay | Admitting: Family Medicine

## 2018-03-31 ENCOUNTER — Encounter: Payer: Self-pay | Admitting: Family Medicine

## 2018-05-07 ENCOUNTER — Encounter: Payer: Self-pay | Admitting: Family Medicine

## 2018-05-07 DIAGNOSIS — J45909 Unspecified asthma, uncomplicated: Secondary | ICD-10-CM

## 2018-05-08 MED ORDER — ALBUTEROL SULFATE HFA 108 (90 BASE) MCG/ACT IN AERS: 1.0000 | INHALATION_SPRAY | Freq: Four times a day (QID) | RESPIRATORY_TRACT | 0 refills | Status: DC | PRN

## 2018-05-08 MED ORDER — BUDESONIDE-FORMOTEROL FUMARATE 80-4.5 MCG/ACT IN AERO: 2.0000 | INHALATION_SPRAY | Freq: Every day | RESPIRATORY_TRACT | 0 refills | Status: DC

## 2018-06-03 ENCOUNTER — Emergency Department (HOSPITAL_COMMUNITY)
Admission: EM | Admit: 2018-06-03 | Discharge: 2018-06-04 | Disposition: A | Discharge status: Home / Self Care | Payer: Self-pay | Attending: Emergency Medicine | Admitting: Emergency Medicine

## 2018-06-03 ENCOUNTER — Encounter (HOSPITAL_COMMUNITY): Payer: Self-pay | Admitting: *Deleted

## 2018-06-03 ENCOUNTER — Other Ambulatory Visit: Payer: Self-pay

## 2018-06-03 DIAGNOSIS — J45909 Unspecified asthma, uncomplicated: Secondary | ICD-10-CM | POA: Insufficient documentation

## 2018-06-03 DIAGNOSIS — R112 Nausea with vomiting, unspecified: Secondary | ICD-10-CM

## 2018-06-03 DIAGNOSIS — Z87891 Personal history of nicotine dependence: Secondary | ICD-10-CM | POA: Insufficient documentation

## 2018-06-03 DIAGNOSIS — K5289 Other specified noninfective gastroenteritis and colitis: Secondary | ICD-10-CM | POA: Insufficient documentation

## 2018-06-03 DIAGNOSIS — A084 Viral intestinal infection, unspecified: Secondary | ICD-10-CM

## 2018-06-03 DIAGNOSIS — Z79899 Other long term (current) drug therapy: Secondary | ICD-10-CM | POA: Insufficient documentation

## 2018-06-03 DIAGNOSIS — B349 Viral infection, unspecified: Secondary | ICD-10-CM | POA: Insufficient documentation

## 2018-06-03 DIAGNOSIS — I10 Essential (primary) hypertension: Secondary | ICD-10-CM | POA: Insufficient documentation

## 2018-06-03 DIAGNOSIS — R197 Diarrhea, unspecified: Secondary | ICD-10-CM

## 2018-06-03 LAB — CBC WITH DIFFERENTIAL/PLATELET
Abs Immature Granulocytes: 0.01 10*3/uL (ref 0.00–0.07)
Basophils Absolute: 0.1 10*3/uL (ref 0.0–0.1)
Basophils Relative: 1 %
Eosinophils Absolute: 0 10*3/uL (ref 0.0–0.5)
Eosinophils Relative: 0 %
HCT: 40.4 % (ref 36.0–46.0)
Hemoglobin: 13 g/dL (ref 12.0–15.0)
Immature Granulocytes: 0 %
Lymphocytes Relative: 20 %
Lymphs Abs: 1.4 10*3/uL (ref 0.7–4.0)
MCH: 27.4 pg (ref 26.0–34.0)
MCHC: 32.2 g/dL (ref 30.0–36.0)
MCV: 85.1 fL (ref 80.0–100.0)
Monocytes Absolute: 0.2 10*3/uL (ref 0.1–1.0)
Monocytes Relative: 3 %
Neutro Abs: 5.4 10*3/uL (ref 1.7–7.7)
Neutrophils Relative %: 76 %
Platelets: 416 10*3/uL — ABNORMAL HIGH (ref 150–400)
RBC: 4.75 MIL/uL (ref 3.87–5.11)
RDW: 13.2 % (ref 11.5–15.5)
WBC: 7.1 10*3/uL (ref 4.0–10.5)
nRBC: 0 % (ref 0.0–0.2)

## 2018-06-03 LAB — COMPREHENSIVE METABOLIC PANEL
ALT: 19 U/L (ref 0–44)
AST: 28 U/L (ref 15–41)
Albumin: 4.3 g/dL (ref 3.5–5.0)
Alkaline Phosphatase: 64 U/L (ref 38–126)
Anion gap: 5 (ref 5–15)
BUN: 9 mg/dL (ref 6–20)
CO2: 26 mmol/L (ref 22–32)
Calcium: 8.8 mg/dL — ABNORMAL LOW (ref 8.9–10.3)
Chloride: 111 mmol/L (ref 98–111)
Creatinine, Ser: 0.86 mg/dL (ref 0.44–1.00)
GFR calc Af Amer: >60 mL/min (ref >60)
GFR calc non Af Amer: >60 mL/min (ref >60)
Glucose, Bld: 106 mg/dL — ABNORMAL HIGH (ref 70–99)
Potassium: 4.5 mmol/L (ref 3.5–5.1)
Sodium: 142 mmol/L (ref 135–145)
Total Bilirubin: 1 mg/dL (ref 0.3–1.2)
Total Protein: 7.9 g/dL (ref 6.5–8.1)

## 2018-06-03 LAB — URINALYSIS, ROUTINE W REFLEX MICROSCOPIC
Bacteria, UA: NONE SEEN
Bilirubin Urine: NEGATIVE
Glucose, UA: NEGATIVE mg/dL
Hgb urine dipstick: NEGATIVE
Ketones, ur: NEGATIVE mg/dL
Leukocytes, UA: NEGATIVE
Nitrite: NEGATIVE
Protein, ur: 30 mg/dL — AB
Specific Gravity, Urine: 1.021 (ref 1.005–1.030)
pH: 8 (ref 5.0–8.0)

## 2018-06-03 LAB — MAGNESIUM: Magnesium: 1.8 mg/dL (ref 1.7–2.4)

## 2018-06-03 LAB — I-STAT BETA HCG BLOOD, ED (MC, WL, AP ONLY): I-stat hCG, quantitative: <5 m[IU]/mL (ref <5)

## 2018-06-03 LAB — LIPASE, BLOOD: Lipase: 26 U/L (ref 11–51)

## 2018-06-03 MED ORDER — PROMETHAZINE HCL 25 MG/ML IJ SOLN
25.0000 mg | Freq: Once | INTRAMUSCULAR | Status: AC
  Administered 2018-06-03: 25 mg via INTRAVENOUS
  Filled 2018-06-03: qty 1

## 2018-06-03 MED ORDER — PROMETHAZINE HCL 25 MG PO TABS: 25.0000 mg | ORAL_TABLET | Freq: Four times a day (QID) | ORAL | 0 refills | Status: DC | PRN

## 2018-06-03 MED ORDER — SODIUM CHLORIDE 0.9 % IV BOLUS
2000.0000 mL | Freq: Once | INTRAVENOUS | Status: AC
  Administered 2018-06-03: 2000 mL via INTRAVENOUS

## 2018-06-03 MED ORDER — SODIUM CHLORIDE 0.9 % IV BOLUS: 1000.0000 mL | Freq: Once | INTRAVENOUS | Status: DC

## 2018-06-03 MED ORDER — ONDANSETRON 4 MG PO TBDP: 4.0000 mg | ORAL_TABLET | Freq: Three times a day (TID) | ORAL | 0 refills | Status: DC | PRN

## 2018-06-03 NOTE — Discharge Instructions (Signed)
Use zofran and/or phenergan as prescribed, as needed for nausea. Alternate between tylenol and motrin as needed for pain. May consider using over the counter tums, maalox, pepto bismol, or other over the counter remedies to help with symptoms. Stay well hydrated with small sips of fluids throughout the day. Follow a BRAT (banana-rice-applesauce-toast) diet as described below for the next 24-48 hours. The 'BRAT' diet is suggested, then progress to diet as tolerated as symptoms abate. Call your regular doctor if bloody stools, persistent diarrhea, vomiting, fever or abdominal pain. Follow up with your regular doctor in 1 week for recheck of symptoms. Return to ER for changing or worsening of symptoms.

## 2018-06-03 NOTE — ED Notes (Signed)
Urine culture sent to the lab. 

## 2018-06-03 NOTE — ED Triage Notes (Signed)
Pt bib EMS and presents after drinking ETOH last night/ early this morning.  Pt also reported doing liquid THC.  Pt presents n/v/d and tremors since 13:30 today. Pt a/o x 4. EMS gave pt 4mg  of Zofran IV and pt reported improvement.

## 2018-06-03 NOTE — ED Provider Notes (Signed)
[facility]-EMERGENCY DEPT Provider Note   CSN: [id number] Arrival date & time: 06/03/18  1840     History   Chief Complaint Chief Complaint  Patient presents with  . Nausea  . Emesis    HPI Andrea Daniels is a 27 y.o. female with a PMHx of asthma, gastritis, HTN, and ovarian cysts, who presents to the ED with complaints of nausea, vomiting, and diarrhea that began around 1:30 PM.  Patient reports approximately 6 episodes of nonbloody nonbilious emesis and 4 episodes of nonbloody diarrhea since 1:30 PM.  Drinking liquids seem to worsen her symptoms, she was given Zofran in route with minimal relief of her symptoms.  She states that about an hour after she started vomiting she noticed that she had a tremor in her right arm.  She admits that she drinks 3 mixed drinks of alcohol last night.  She denies any marijuana or THC use, however triage note reports that she used THC liquid, patient did not report this to me.  She endorses occasional NSAID use.  She denies any fevers, chills, CP, SOB, abdominal pain, hematemesis, melena, hematochezia, obstipation, constipation, dysuria, hematuria, vaginal bleeding or discharge, myalgias, arthralgias, numbness, tingling, focal weakness, or any other complaints at this time.  She denies any recent travel, sick contacts, suspicious food intake, or prior abdominal surgeries.  The history is provided by the patient and medical records. No language interpreter was used.  Emesis   Associated symptoms include diarrhea. Pertinent negatives include no abdominal pain, no arthralgias, no chills, no fever and no myalgias.    Past Medical History:  Diagnosis Date  . Asthma   . Gastritis   . Gastritis   . Hypertension   . Ovarian cyst   . UTI (lower urinary tract infection)     Patient Active Problem List   Diagnosis Date Noted  . Cyst of left ovary 12/17/2017  . Acute respiratory failure (HCC) 10/12/2016  . Acute respiratory failure  with hypoxia (HCC) 10/11/2016  . Hypokalemia 10/11/2016  . Lobar pneumonia (HCC)   . CAP (community acquired pneumonia) 06/03/2016  . Epigastric abdominal pain   . Tachycardia 06/02/2016  . Leukocytosis 06/02/2016  . Community acquired pneumonia of right lung 06/02/2016  . Asthma 06/02/2016  . Hypoxia   . Hypophosphatemia 03/20/2015  . Low TSH level 03/20/2015  . Metabolic acidosis 03/20/2015  . Microcytic anemia 03/20/2015  . Exacerbation of asthma 03/19/2015  . Elevated glucose 07/03/2014  . Asthma with exacerbation 10/22/2013  . Hives 07/11/2013  . Status asthmaticus 06/08/2013  . Nausea vomiting and diarrhea 08/16/2012  . Allergic rhinitis 04/24/2012  . Preventative health care 08/14/2011    Past Surgical History:  Procedure Laterality Date  . NO PAST SURGERIES       OB History    Gravida  0   Para      Term      Preterm      AB      Living        SAB      TAB      Ectopic      Multiple      Live Births               Home Medications    Prior to Admission medications   Medication Sig Start Date End Date Taking? Authorizing Provider  acetaminophen (TYLENOL) 325 MG tablet Take 650 mg by mouth every 6 (six) hours as needed for mild pain.  [provider]  albuterol (PROVENTIL HFA;VENTOLIN HFA) 108 (90 Base) MCG/ACT inhaler Inhale 1-2 puffs into the lungs every 6 (six) hours as needed for wheezing or shortness of breath. 05/08/18   Mliss Sax, MD  budesonide-formoterol [facility]) 80-4.5 MCG/ACT inhaler Inhale 2 puffs into the lungs daily. 05/08/18   Mliss Sax, MD  cetirizine (ZYRTEC) 10 MG tablet Take 1 tablet (10 mg total) by mouth at bedtime. Patient not taking: Reported on 01/18/2018 09/18/17   Mliss Sax, MD  fluticasone [facility]) 50 MCG/ACT nasal spray Place 2 sprays into both nostrils daily. Patient not taking: Reported on 01/18/2018 09/18/17   Mliss Sax, MD    loratadine-pseudoephedrine (CLARITIN-D 12 HOUR) 5-120 MG tablet Take 1 tablet by mouth 2 (two) times daily. Patient not taking: Reported on 01/18/2018 04/24/17   McDonald, Pedro Earls A, PA-C  meloxicam (MOBIC) 7.5 MG tablet Take 1-2 tablets (7.5-15 mg total) by mouth daily. Patient not taking: Reported on 01/18/2018 12/17/17   Cristina Gong, PA-C  montelukast (SINGULAIR) 10 MG tablet Take 1 tablet (10 mg total) by mouth at bedtime. Patient not taking: Reported on 01/18/2018 04/24/17   McDonald, Mia A, PA-C  ondansetron (ZOFRAN ODT) 4 MG disintegrating tablet Take 1 tablet (4 mg total) by mouth every 8 (eight) hours as needed for nausea or vomiting. Patient not taking: Reported on 01/18/2018 12/17/17   Cristina Gong, PA-C  predniSONE (DELTASONE) 20 MG tablet Take 2 tablets (40 mg total) by mouth daily. Patient not taking: Reported on 01/18/2018 10/16/17   Ward, Chase Picket, PA-C    Family History Family History  Problem Relation Age of Onset  . Arthritis Other   . Cancer Other        breast cancer  . Hypertension Other   . Asthma Other   . Hyperlipidemia Other   . Hypertension Other   . Diabetes Other   . Arthritis Mother   . Hearing loss Mother   . Hypertension Mother   . Arthritis Father   . Hypertension Father   . Early death Brother   . Arthritis Maternal Grandmother   . Asthma Maternal Grandmother   . Cancer Maternal Grandmother   . Diabetes Maternal Grandmother   . Hearing loss Maternal Grandmother   . Hypertension Maternal Grandmother   . Kidney disease Paternal Grandmother   . Stroke Paternal Grandfather     Social History Social History   Tobacco Use  . Smoking status: Former Smoker    Packs/day: 0.10    Types: Cigarettes  . Smokeless tobacco: Never Used  Substance Use Topics  . Alcohol use: Yes    Comment: Once a week   . Drug use: Yes    Types: Marijuana     Allergies   Patient has no known allergies.   Review of Systems Review of Systems   Constitutional: Negative for chills and fever.  Respiratory: Negative for shortness of breath.   Cardiovascular: Negative for chest pain.  Gastrointestinal: Positive for diarrhea, nausea and vomiting. Negative for abdominal pain, blood in stool and constipation.  Genitourinary: Negative for dysuria, hematuria, vaginal bleeding and vaginal discharge.  Musculoskeletal: Negative for arthralgias and myalgias.  Skin: Negative for color change.  Allergic/Immunologic: Negative for immunocompromised state.  Neurological: Positive for tremors (R arm). Negative for weakness and numbness.  Psychiatric/Behavioral: Negative for confusion.   All other systems reviewed and are negative for acute change except as noted in the HPI.    Physical Exam Updated Vital Signs  BP (!) 155/95 (BP Location: Left Arm)   Pulse 89   Temp 98.4 F (36.9 C) (Oral)   Resp 18   Ht 4\' 11"  (1.499 m)   LMP 05/11/2018   SpO2 98%   BMI 31.10 kg/m   Physical Exam  Constitutional: She is oriented to person, place, and time. Vital signs are normal. She appears well-developed and well-nourished.  Non-toxic appearance. No distress.  Afebrile, nontoxic, NAD  HENT:  Head: Normocephalic and atraumatic.  Mouth/Throat: Oropharynx is clear and moist and mucous membranes are normal.  Eyes: Conjunctivae and EOM are normal. Right eye exhibits no discharge. Left eye exhibits no discharge.  Neck: Normal range of motion. Neck supple.  Cardiovascular: Normal rate, regular rhythm, normal heart sounds and intact distal pulses. Exam reveals no gallop and no friction rub.  No murmur heard. Pulmonary/Chest: Effort normal and breath sounds normal. No respiratory distress. She has no decreased breath sounds. She has no wheezes. She has no rhonchi. She has no rales.  Abdominal: Soft. Normal appearance and bowel sounds are normal. She exhibits no distension. There is no tenderness. There is no rigidity, no rebound, no guarding, no CVA  tenderness, no tenderness at McBurney's point and negative Murphy's sign.  Soft, nondistended, +BS throughout, without appreciable TTP, no r/g/r, neg murphy's, neg mcburney's, no CVA TTP   Musculoskeletal: Normal range of motion.  No tremor noted during exam MAE x4 Strength and sensation grossly intact in all extremities Distal pulses intact  Neurological: She is alert and oriented to person, place, and time. She has normal strength. No sensory deficit.  Skin: Skin is warm, dry and intact. No rash noted.  Psychiatric: She has a normal mood and affect.  Nursing note and vitals reviewed.    ED Treatments / Results  Labs (all labs ordered are listed, but only abnormal results are displayed) Labs Reviewed  CBC WITH DIFFERENTIAL/PLATELET - Abnormal; Notable for the following components:      Result Value   Platelets 416 (*)    All other components within normal limits  COMPREHENSIVE METABOLIC PANEL - Abnormal; Notable for the following components:   Glucose, Bld 106 (*)    Calcium 8.8 (*)    All other components within normal limits  URINALYSIS, ROUTINE W REFLEX MICROSCOPIC - Abnormal; Notable for the following components:   Protein, ur 30 (*)    All other components within normal limits  LIPASE, BLOOD  MAGNESIUM  I-STAT BETA HCG BLOOD, ED ([facility], [facility], [facility] ONLY)    EKG None  Radiology No results found.  Procedures Procedures (including critical care time)  Medications Ordered in ED Medications  sodium chloride 0.9 % bolus 2,000 mL (2,000 mLs Intravenous New Bag/Given 06/03/18 2132)  promethazine (PHENERGAN) injection 25 mg (25 mg Intravenous Given 06/03/18 2133)     Initial Impression / Assessment and Plan / ED Course  I have reviewed the triage vital signs and the nursing notes.  Pertinent labs & imaging results that were available during my care of the patient were reviewed by me and considered in my medical decision making (see chart for details).     27 y.o. female  here with nausea, vomiting, and diarrhea that began around 1:30 PM.  She drink alcohol last night.  No known sick contacts that she is aware of.  On exam, no significant abdominal tenderness, no concerning exam findings.  She mentions that she has a right arm tremor but this is not noted during the exam.  All extremities  are neurovascularly intact with good strength.  We will get labs, doubt that this time.  Could be viral GI illness versus alcohol related.  We will give Phenergan since Zofran did not help entirely, will give fluids, and reassess shortly.  10:32 PM CBC w/diff WNL. CMP WNL. Lipase WNL. BetaHCG neg. Mg level WNL. U/A unremarkable. Pt feeling better and tolerating PO well here. Overall, symptoms c/w viral gastroenteritis.  Discussed OTC remedies for symptomatic relief, BRAT diet, adequate hydration, and will rx zofran and phenergan since those worked well here. Advised tylenol/motrin for pain. F/up with PCP in 1wk for recheck of symptoms. I explained the diagnosis and have given explicit precautions to return to the ER including for any other new or worsening symptoms. The patient understands and accepts the medical plan as it's been dictated and I have answered their questions. Discharge instructions concerning home care and prescriptions have been given. The patient is STABLE and is discharged to home in good condition.     Final Clinical Impressions(s) / ED Diagnoses   Final diagnoses:  Nausea vomiting and diarrhea  Viral gastroenteritis    ED Discharge Orders         Ordered    ondansetron (ZOFRAN ODT) 4 MG disintegrating tablet  Every 8 hours PRN     06/03/18 2237    promethazine (PHENERGAN) 25 MG tablet  Every 6 hours PRN     06/03/18 [address]37           Analia Zuk, [place] VernonMercedes, [place] JerseyPA-C 06/03/18 2237    Arby BarrettePfeiffer, Marcy, MD 06/04/18 [phone]

## 2018-06-03 NOTE — ED Notes (Signed)
ED Provider at bedside. 

## 2018-10-17 ENCOUNTER — Telehealth: Payer: Self-pay | Admitting: Family Medicine

## 2018-10-17 NOTE — Telephone Encounter (Signed)
Called and couldn't leave vm for patient. Calling to schedule virtual visit with Andrea Daniels.

## 2019-02-24 ENCOUNTER — Encounter (HOSPITAL_COMMUNITY): Payer: Self-pay | Admitting: Emergency Medicine

## 2019-02-24 ENCOUNTER — Other Ambulatory Visit: Payer: Self-pay

## 2019-02-24 ENCOUNTER — Emergency Department (HOSPITAL_COMMUNITY)
Admission: EM | Admit: 2019-02-24 | Discharge: 2019-02-24 | Disposition: A | Discharge status: Home / Self Care | Payer: Self-pay | Attending: Emergency Medicine | Admitting: Emergency Medicine

## 2019-02-24 DIAGNOSIS — Z79899 Other long term (current) drug therapy: Secondary | ICD-10-CM | POA: Insufficient documentation

## 2019-02-24 DIAGNOSIS — G5602 Carpal tunnel syndrome, left upper limb: Secondary | ICD-10-CM | POA: Insufficient documentation

## 2019-02-24 DIAGNOSIS — I10 Essential (primary) hypertension: Secondary | ICD-10-CM | POA: Insufficient documentation

## 2019-02-24 DIAGNOSIS — Z87891 Personal history of nicotine dependence: Secondary | ICD-10-CM | POA: Insufficient documentation

## 2019-02-24 LAB — CBC WITH DIFFERENTIAL/PLATELET
Abs Immature Granulocytes: 0.02 10*3/uL (ref 0.00–0.07)
Basophils Absolute: 0.1 10*3/uL (ref 0.0–0.1)
Basophils Relative: 1 %
Eosinophils Absolute: 0.5 10*3/uL (ref 0.0–0.5)
Eosinophils Relative: 7 %
HCT: 40.1 % (ref 36.0–46.0)
Hemoglobin: 12.5 g/dL (ref 12.0–15.0)
Immature Granulocytes: 0 %
Lymphocytes Relative: 49 %
Lymphs Abs: 3.3 10*3/uL (ref 0.7–4.0)
MCH: 25.9 pg — ABNORMAL LOW (ref 26.0–34.0)
MCHC: 31.2 g/dL (ref 30.0–36.0)
MCV: 83.2 fL (ref 80.0–100.0)
Monocytes Absolute: 0.4 10*3/uL (ref 0.1–1.0)
Monocytes Relative: 6 %
Neutro Abs: 2.5 10*3/uL (ref 1.7–7.7)
Neutrophils Relative %: 37 %
Platelets: 403 10*3/uL — ABNORMAL HIGH (ref 150–400)
RBC: 4.82 MIL/uL (ref 3.87–5.11)
RDW: 13.2 % (ref 11.5–15.5)
WBC: 6.7 10*3/uL (ref 4.0–10.5)
nRBC: 0 % (ref 0.0–0.2)

## 2019-02-24 LAB — COMPREHENSIVE METABOLIC PANEL WITH GFR
ALT: 14 U/L (ref 0–44)
AST: 19 U/L (ref 15–41)
Albumin: 4 g/dL (ref 3.5–5.0)
Alkaline Phosphatase: 61 U/L (ref 38–126)
Anion gap: 9 (ref 5–15)
BUN: 14 mg/dL (ref 6–20)
CO2: 23 mmol/L (ref 22–32)
Calcium: 8.9 mg/dL (ref 8.9–10.3)
Chloride: 106 mmol/L (ref 98–111)
Creatinine, Ser: 0.98 mg/dL (ref 0.44–1.00)
GFR calc Af Amer: >60 mL/min (ref >60)
GFR calc non Af Amer: >60 mL/min (ref >60)
Glucose, Bld: 87 mg/dL (ref 70–99)
Potassium: 3.8 mmol/L (ref 3.5–5.1)
Sodium: 138 mmol/L (ref 135–145)
Total Bilirubin: 0.2 mg/dL — ABNORMAL LOW (ref 0.3–1.2)
Total Protein: 7.4 g/dL (ref 6.5–8.1)

## 2019-02-24 LAB — URINALYSIS, ROUTINE W REFLEX MICROSCOPIC
Bilirubin Urine: NEGATIVE
Glucose, UA: NEGATIVE mg/dL
Hgb urine dipstick: NEGATIVE
Ketones, ur: NEGATIVE mg/dL
Leukocytes,Ua: NEGATIVE
Nitrite: NEGATIVE
Protein, ur: NEGATIVE mg/dL
Specific Gravity, Urine: 1.024 (ref 1.005–1.030)
pH: 7 (ref 5.0–8.0)

## 2019-02-24 MED ORDER — AMLODIPINE BESYLATE 5 MG PO TABS: 5.0000 mg | ORAL_TABLET | Freq: Every day | ORAL | 0 refills | Status: AC

## 2019-02-24 NOTE — ED Triage Notes (Signed)
Patient with left arm numbness that started this morning at 1030a.  She states that she has been having high blood pressure that she discovered a few days ago.  She now is having thoughts that she could be having a stroke.  She was able to walk in, no gait abnormalities, no facial droop, equal hand grips and pedal pushes.

## 2019-02-24 NOTE — ED Provider Notes (Signed)
[facility]   CSN: [id number] Arrival date & time: 02/24/19  0013     History   Chief Complaint Chief Complaint  Patient presents with  . Numbness  . Hypertension    HPI Andrea Daniels is a 28 y.o. female.     HPI  This is a 28 year old female who presents with high blood pressure and left hand numbness.  Patient reports that she was noted to have high blood pressure on Friday when being checked out by chiropractor.  She reports "my numbers were high."  She states she is only ever had high blood pressure issues when she sees a doctor.  This morning she noted she had some left hand numbness.  It was in digits 2 through 4 and over the palmar aspect of the hand.  She also states that some of the numbness seems to radiate upward.  She is right-handed.  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  Denies weakness, numbness, speech difficulty or other strokelike symptoms.  Denies headache, chest pain, recent illnesses, shortness of breath.  Past Medical History:  Diagnosis Date  . Asthma   . Gastritis   . Gastritis   . Hypertension   . Ovarian cyst   . UTI (lower urinary tract infection)     Patient Active Problem List   Diagnosis Date Noted  . Cyst of left ovary 12/17/2017  . Acute respiratory failure (HCC) 10/12/2016  . Acute respiratory failure with hypoxia (HCC) 10/11/2016  . Hypokalemia 10/11/2016  . Lobar pneumonia (HCC)   . CAP (community acquired pneumonia) 06/03/2016  . Epigastric abdominal pain   . Tachycardia 06/02/2016  . Leukocytosis 06/02/2016  . Community acquired pneumonia of right lung 06/02/2016  . Asthma 06/02/2016  . Hypoxia   . Hypophosphatemia 03/20/2015  . Low TSH level 03/20/2015  . Metabolic acidosis 03/20/2015  . Microcytic anemia 03/20/2015  . Exacerbation of asthma 03/19/2015  . Elevated glucose 07/03/2014  . Asthma with exacerbation 10/22/2013  . Hives 07/11/2013  . Status asthmaticus  06/08/2013  . Nausea vomiting and diarrhea 08/16/2012  . Allergic rhinitis 04/24/2012  . Preventative health care 08/14/2011    Past Surgical History:  Procedure Laterality Date  . NO PAST SURGERIES       OB History    Gravida  0   Para      Term      Preterm      AB      Living        SAB      TAB      Ectopic      Multiple      Live Births               Home Medications    Prior to Admission medications   Medication Sig Start Date End Date Taking? Authorizing Provider  albuterol (PROVENTIL HFA;VENTOLIN HFA) 108 (90 Base) MCG/ACT inhaler Inhale 1-2 puffs into the lungs every 6 (six) hours as needed for wheezing or shortness of breath. 05/08/18   Mliss SaxKremer, William Alfred, MD  amLODipine (NORVASC) 5 MG tablet Take 1 tablet (5 mg total) by mouth daily. 02/24/19   Aleira Deiter, Mayer Maskerourtney F, MD  aspirin-acetaminophen-caffeine (EXCEDRIN MIGRAINE) [phone] MG tablet Take 2 tablets by mouth every 6 (six) hours as needed for headache.    [provider]  budesonide-formoterol (SYMBICORT) 80-4.5 MCG/ACT inhaler Inhale 2 puffs into the lungs daily. 05/08/18   Mliss SaxKremer, William Alfred, MD  cetirizine (ZYRTEC) 10 MG tablet Take 1 tablet (10 mg total) by mouth at bedtime. Patient not taking: Reported on 01/18/2018 09/18/17   Mliss Sax, MD  fluticasone [facility]) 50 MCG/ACT nasal spray Place 2 sprays into both nostrils daily. Patient not taking: Reported on 01/18/2018 09/18/17   Mliss Sax, MD  loratadine-pseudoephedrine (CLARITIN-D 12 HOUR) 5-120 MG tablet Take 1 tablet by mouth 2 (two) times daily. Patient not taking: Reported on 01/18/2018 04/24/17   McDonald, Pedro Earls A, PA-C  meloxicam (MOBIC) 7.5 MG tablet Take 1-2 tablets (7.5-15 mg total) by mouth daily. Patient not taking: Reported on 01/18/2018 12/17/17   Cristina Gong, PA-C  montelukast (SINGULAIR) 10 MG tablet Take 1 tablet (10 mg total) by mouth at bedtime. Patient not taking: Reported on  06/03/2018 04/24/17   McDonald, Mia A, PA-C  ondansetron (ZOFRAN ODT) 4 MG disintegrating tablet Take 1 tablet (4 mg total) by mouth every 8 (eight) hours as needed for nausea or vomiting. Patient not taking: Reported on 01/18/2018 12/17/17   Cristina Gong, PA-C  ondansetron (ZOFRAN ODT) 4 MG disintegrating tablet Take 1 tablet (4 mg total) by mouth every 8 (eight) hours as needed for nausea or vomiting. 06/03/18   Street, Mercedes, PA-C  predniSONE (DELTASONE) 20 MG tablet Take 2 tablets (40 mg total) by mouth daily. Patient not taking: Reported on 01/18/2018 10/16/17   Ward, Chase Picket, PA-C  promethazine (PHENERGAN) 25 MG tablet Take 1 tablet (25 mg total) by mouth every 6 (six) hours as needed for nausea or vomiting. 06/03/18   Street, [place], PA-C    Family History Family History  Problem Relation Age of Onset  . Arthritis Other   . Cancer Other        breast cancer  . Hypertension Other   . Asthma Other   . Hyperlipidemia Other   . Hypertension Other   . Diabetes Other   . Arthritis Mother   . Hearing loss Mother   . Hypertension Mother   . Arthritis Father   . Hypertension Father   . Early death Brother   . Arthritis Maternal Grandmother   . Asthma Maternal Grandmother   . Cancer Maternal Grandmother   . Diabetes Maternal Grandmother   . Hearing loss Maternal Grandmother   . Hypertension Maternal Grandmother   . Kidney disease Paternal Grandmother   . Stroke Paternal Grandfather     Social History Social History   Tobacco Use  . Smoking status: Former Smoker    Packs/day: 0.10    Types: Cigarettes  . Smokeless tobacco: Never Used  Substance Use Topics  . Alcohol use: Yes    Comment: Once a week   . Drug use: Yes    Types: Marijuana     Allergies   Patient has no known allergies.   Review of Systems Review of Systems  Constitutional: Negative for fever.  Respiratory: Negative for shortness of breath.   Cardiovascular: Negative for chest pain.   Gastrointestinal: Negative for abdominal pain, nausea and vomiting.  Genitourinary: Negative for dysuria.  Neurological: Positive for numbness. Negative for weakness, light-headedness and headaches.  All other systems reviewed and are negative.    Physical Exam Updated Vital Signs BP (!) 159/118 (BP Location: Right Arm)   Pulse (!) 58   Temp 98.5 F (36.9 C) (Oral)   Resp 16   Ht 1.499 m (4\' 11" )   Wt 82.6 kg   LMP 02/15/2019 (Exact Date)   SpO2 99%   BMI  36.78 kg/m   Physical Exam Vitals signs and nursing note reviewed.  Constitutional:      Appearance: She is well-developed. She is not ill-appearing.  HENT:     Head: Normocephalic and atraumatic.     Mouth/Throat:     Mouth: Mucous membranes are moist.  Eyes:     Pupils: Pupils are equal, round, and reactive to light.  Neck:     Musculoskeletal: Neck supple.  Cardiovascular:     Rate and Rhythm: Normal rate and regular rhythm.     Heart sounds: Normal heart sounds.  Pulmonary:     Effort: Pulmonary effort is normal. No respiratory distress.     Breath sounds: No wheezing.  Abdominal:     General: Bowel sounds are normal.     Palpations: Abdomen is soft.     Tenderness: There is no abdominal tenderness.  Musculoskeletal:     Right lower leg: No edema.     Left lower leg: No edema.  Skin:    General: Skin is warm and dry.  Neurological:     Mental Status: She is alert and oriented to person, place, and time.     Comments: Cranial nerves II through XII intact, follow 5 strength in all 4 extremities, sensation objectively intact, patient with tenderness to palpation over the left carpal tunnel with radiation of numbness into her hand  Psychiatric:        Mood and Affect: Mood normal.      ED Treatments / Results  Labs (all labs ordered are listed, but only abnormal results are displayed) Labs Reviewed  CBC WITH DIFFERENTIAL/PLATELET - Abnormal; Notable for the following components:      Result Value   MCH  25.9 (*)    Platelets 403 (*)    All other components within normal limits  COMPREHENSIVE METABOLIC PANEL - Abnormal; Notable for the following components:   Total Bilirubin 0.2 (*)    All other components within normal limits  URINALYSIS, ROUTINE W REFLEX MICROSCOPIC - Abnormal; Notable for the following components:   APPearance HAZY (*)    All other components within normal limits  I-STAT BETA HCG BLOOD, ED ([facility], [facility], [facility] ONLY)    EKG None  Radiology No results found.  Procedures Procedures (including critical care time)  Medications Ordered in ED Medications - No data to display   Initial Impression / Assessment and Plan / ED Course  I have reviewed the triage vital signs and the nursing notes.  Pertinent labs & imaging results that were available during my care of the patient were reviewed by me and considered in my medical decision making (see chart for details).        Patient presents with high blood pressure.  Also reports numbness in the left hand.  Blood pressure 178/117.  This improved to 159/118.  Suspect she has longstanding hypertension.  She has no evidence of endorgan dysfunction or symptoms.  Her left hand numbness is likely unrelated as this most fits with carpal tunnel syndrome.  No indication at this time for stroke.  Patient reassured.  Lab work reviewed and largely reassuring.  Will initiate Norvasc.  Patient does have a primary physician and we will have her follow-up closely for pressure recheck and additional medications.  She was provided with a left wrist splint.  After history, exam, and medical workup I feel the patient has been appropriately medically screened and is safe for discharge home. Pertinent diagnoses were discussed with the patient. Patient  was given return precautions.   Final Clinical Impressions(s) / ED Diagnoses   Final diagnoses:  Essential hypertension  Carpal tunnel syndrome of left wrist    ED Discharge Orders          Ordered    amLODipine (NORVASC) 5 MG tablet  Daily     02/24/19 0452           Shon BatonHorton, Ayliana Casciano F, MD 02/24/19 [phone]

## 2019-02-24 NOTE — Discharge Instructions (Addendum)
You were seen today for high blood pressure and left hand numbness.  Follow-up closely your primary physician for blood pressure recheck.  You will be started on Norvasc.  Additionally, regarding your left hand numbness, this is likely related to carpal tunnel syndrome.  Wear wrist splint as needed.

## 2019-10-09 ENCOUNTER — Ambulatory Visit: Payer: Self-pay | Attending: Internal Medicine

## 2019-10-09 DIAGNOSIS — Z20822 Contact with and (suspected) exposure to covid-19: Secondary | ICD-10-CM | POA: Insufficient documentation

## 2019-10-10 LAB — NOVEL CORONAVIRUS, NAA: SARS-CoV-2, NAA: NOT DETECTED

## 2019-10-10 LAB — SARS-COV-2, NAA 2 DAY TAT

## 2019-12-26 ENCOUNTER — Encounter (HOSPITAL_BASED_OUTPATIENT_CLINIC_OR_DEPARTMENT_OTHER): Payer: Self-pay | Admitting: Emergency Medicine

## 2019-12-26 ENCOUNTER — Emergency Department (HOSPITAL_BASED_OUTPATIENT_CLINIC_OR_DEPARTMENT_OTHER)
Admission: EM | Admit: 2019-12-26 | Discharge: 2019-12-26 | Disposition: A | Discharge status: Home / Self Care | Payer: PRIVATE HEALTH INSURANCE | Attending: Emergency Medicine | Admitting: Emergency Medicine

## 2019-12-26 ENCOUNTER — Other Ambulatory Visit: Payer: Self-pay

## 2019-12-26 DIAGNOSIS — R0602 Shortness of breath: Secondary | ICD-10-CM | POA: Diagnosis present

## 2019-12-26 DIAGNOSIS — J45909 Unspecified asthma, uncomplicated: Secondary | ICD-10-CM | POA: Insufficient documentation

## 2019-12-26 DIAGNOSIS — Z79899 Other long term (current) drug therapy: Secondary | ICD-10-CM | POA: Diagnosis not present

## 2019-12-26 DIAGNOSIS — I10 Essential (primary) hypertension: Secondary | ICD-10-CM | POA: Diagnosis not present

## 2019-12-26 DIAGNOSIS — Z7982 Long term (current) use of aspirin: Secondary | ICD-10-CM | POA: Insufficient documentation

## 2019-12-26 DIAGNOSIS — J4521 Mild intermittent asthma with (acute) exacerbation: Secondary | ICD-10-CM

## 2019-12-26 MED ORDER — PREDNISONE 20 MG PO TABS: 40.0000 mg | ORAL_TABLET | Freq: Every day | ORAL | 0 refills | Status: DC

## 2019-12-26 MED ORDER — PREDNISONE 20 MG PO TABS
40.0000 mg | ORAL_TABLET | Freq: Once | ORAL | Status: AC
  Administered 2019-12-26: 40 mg via ORAL
  Filled 2019-12-26: qty 2

## 2019-12-26 MED ORDER — ALBUTEROL SULFATE HFA 108 (90 BASE) MCG/ACT IN AERS
INHALATION_SPRAY | RESPIRATORY_TRACT | Status: AC
  Administered 2019-12-26: 8
  Filled 2019-12-26: qty 6.7

## 2019-12-26 MED ORDER — IPRATROPIUM BROMIDE HFA 17 MCG/ACT IN AERS
INHALATION_SPRAY | RESPIRATORY_TRACT | Status: AC
  Administered 2019-12-26: 2
  Filled 2019-12-26: qty 12.9

## 2019-12-26 NOTE — ED Notes (Signed)
ED Provider at bedside. 

## 2019-12-26 NOTE — ED Triage Notes (Signed)
Asthma attack onset 0100 am last night, " I Used all of my inhaler" Audible wheezing. RT assessed in room. Able to speak short phrases

## 2019-12-26 NOTE — ED Provider Notes (Signed)
[facility] EMERGENCY DEPARTMENT Provider Note   CSN: [id number] Arrival date & time: 12/26/19  1802     History Chief Complaint  Patient presents with  . Asthma    Andrea Daniels is a 29 y.o. female.  29 year old female with past medical history below including asthma, gastritis, hypertension who presents with shortness of breath.  Patient states that she has been having an asthma exacerbation since 1 AM this morning.  She has been using her albuterol inhaler so frequently that she used it up.  She reports associated dry cough, has some sore throat because of the cough but denies any other URI symptoms.  She states that she has been having milder symptoms over the past 2 weeks which have been progressively worsening.  No fevers, vomiting, diarrhea, or sick contacts.  This feels like previous asthma exacerbations.  The history is provided by the patient.  Asthma       Past Medical History:  Diagnosis Date  . Asthma   . Gastritis   . Gastritis   . Hypertension   . Ovarian cyst   . UTI (lower urinary tract infection)     Patient Active Problem List   Diagnosis Date Noted  . Cyst of left ovary 12/17/2017  . Acute respiratory failure (HCC) 10/12/2016  . Acute respiratory failure with hypoxia (HCC) 10/11/2016  . Hypokalemia 10/11/2016  . Lobar pneumonia (HCC)   . CAP (community acquired pneumonia) 06/03/2016  . Epigastric abdominal pain   . Tachycardia 06/02/2016  . Leukocytosis 06/02/2016  . Community acquired pneumonia of right lung 06/02/2016  . Asthma 06/02/2016  . Hypoxia   . Hypophosphatemia 03/20/2015  . Low TSH level 03/20/2015  . Metabolic acidosis 03/20/2015  . Microcytic anemia 03/20/2015  . Exacerbation of asthma 03/19/2015  . Elevated glucose 07/03/2014  . Asthma with exacerbation 10/22/2013  . Hives 07/11/2013  . Status asthmaticus 06/08/2013  . Nausea vomiting and diarrhea 08/16/2012  . Allergic rhinitis 04/24/2012  . Preventative health  care 08/14/2011    Past Surgical History:  Procedure Laterality Date  . NO PAST SURGERIES       OB History    Gravida  0   Para      Term      Preterm      AB      Living        SAB      TAB      Ectopic      Multiple      Live Births              Family History  Problem Relation Age of Onset  . Arthritis Other   . Cancer Other        breast cancer  . Hypertension Other   . Asthma Other   . Hyperlipidemia Other   . Hypertension Other   . Diabetes Other   . Arthritis Mother   . Hearing loss Mother   . Hypertension Mother   . Arthritis Father   . Hypertension Father   . Early death Brother   . Arthritis Maternal Grandmother   . Asthma Maternal Grandmother   . Cancer Maternal Grandmother   . Diabetes Maternal Grandmother   . Hearing loss Maternal Grandmother   . Hypertension Maternal Grandmother   . Kidney disease Paternal Grandmother   . Stroke Paternal Grandfather     Social History   Tobacco Use  . Smoking status: Former Smoker    Packs/day:  0.10    Types: Cigarettes  . Smokeless tobacco: Never Used  Vaping Use  . Vaping Use: Some days  Substance Use Topics  . Alcohol use: Yes    Comment: Once a week   . Drug use: Yes    Types: Marijuana    Home Medications Prior to Admission medications   Medication Sig Start Date End Date Taking? Authorizing Provider  albuterol (PROVENTIL HFA;VENTOLIN HFA) 108 (90 Base) MCG/ACT inhaler Inhale 1-2 puffs into the lungs every 6 (six) hours as needed for wheezing or shortness of breath. 05/08/18   Mliss Sax, MD  amLODipine (NORVASC) 5 MG tablet Take 1 tablet (5 mg total) by mouth daily. 02/24/19   Horton, Mayer Masker, MD  aspirin-acetaminophen-caffeine (EXCEDRIN MIGRAINE) [phone] MG tablet Take 2 tablets by mouth every 6 (six) hours as needed for headache.    [provider]  budesonide-formoterol (SYMBICORT) 80-4.5 MCG/ACT inhaler Inhale 2 puffs into the lungs daily. 05/08/18    Mliss Sax, MD  cetirizine (ZYRTEC) 10 MG tablet Take 1 tablet (10 mg total) by mouth at bedtime. Patient not taking: Reported on 01/18/2018 09/18/17   Mliss Sax, MD  fluticasone [facility]) 50 MCG/ACT nasal spray Place 2 sprays into both nostrils daily. Patient not taking: Reported on 01/18/2018 09/18/17   Mliss Sax, MD  meloxicam (MOBIC) 7.5 MG tablet Take 1-2 tablets (7.5-15 mg total) by mouth daily. Patient not taking: Reported on 01/18/2018 12/17/17   Cristina Gong, PA-C  montelukast (SINGULAIR) 10 MG tablet Take 1 tablet (10 mg total) by mouth at bedtime. Patient not taking: Reported on 06/03/2018 04/24/17   McDonald, Pedro Earls A, PA-C  predniSONE (DELTASONE) 20 MG tablet Take 2 tablets (40 mg total) by mouth daily. 12/26/19   Mckenzi Buonomo, Ambrose [place], MD  promethazine (PHENERGAN) 25 MG tablet Take 1 tablet (25 mg total) by mouth every 6 (six) hours as needed for nausea or vomiting. 06/03/18   Street, [place], PA-C    Allergies    Doxycycline  Review of Systems   Review of Systems  All other systems reviewed and are negative except that which was mentioned in HPI  Physical Exam Updated Vital Signs BP 114/85 (BP Location: Right Arm)   Pulse 85   Temp 98.6 F (37 C) (Oral)   Resp 20   Ht 4\' 11"  (1.499 m)   Wt 83.6 kg   LMP 11/28/2019 (Approximate)   SpO2 100%   BMI 37.22 kg/m   Physical Exam Vitals and nursing note reviewed.  Constitutional:      General: She is not in acute distress.    Appearance: She is well-developed.  HENT:     Head: Normocephalic and atraumatic.     Mouth/Throat:     Mouth: Mucous membranes are moist.     Comments: Mild erythema posterior oropharynx with no exudates Eyes:     Conjunctiva/sclera: Conjunctivae normal.  Cardiovascular:     Rate and Rhythm: Normal rate and regular rhythm.     Heart sounds: Normal heart sounds. No murmur heard.   Pulmonary:     Comments: Mildly increased work of breathing without  respiratory distress; wheezes bilaterally with diminished breath sounds bilateral bases Abdominal:     General: Bowel sounds are normal. There is no distension.     Palpations: Abdomen is soft.     Tenderness: There is no abdominal tenderness.  Musculoskeletal:     Cervical back: Neck supple.     Right lower leg: No edema.  Left lower leg: No edema.  Skin:    General: Skin is warm and dry.  Neurological:     Mental Status: She is alert and oriented to person, place, and time.     Comments: Fluent speech  Psychiatric:        Judgment: Judgment normal.     ED Results / Procedures / Treatments   Labs (all labs ordered are listed, but only abnormal results are displayed) Labs Reviewed - No data to display  EKG None  Radiology No results found.  Procedures Procedures (including critical care time)  Medications Ordered in ED Medications  albuterol (VENTOLIN HFA) 108 (90 Base) MCG/ACT inhaler (8 puffs  Given 12/26/19 1833)  ipratropium (ATROVENT HFA) 17 MCG/ACT inhaler (2 puffs  Given 12/26/19 1833)  predniSONE (DELTASONE) tablet 40 mg (40 mg Oral Given 12/26/19 1844)    ED Course  I have reviewed the triage vital signs and the nursing notes.     MDM Rules/Calculators/A&P                          Wheezing on exam consistent with asthma exacerbation, vital signs reassuring with normal O2 saturation.  No respiratory distress.  Gave albuterol and Atrovent as well as prednisone. On reassessment, pt breathing comfortably on RA. Discussed continuing prednisone at home and provided with albuterol to continue every 4-6 hours.  Reviewed return precautions and she voiced understanding. Final Clinical Impression(s) / ED Diagnoses Final diagnoses:  Mild intermittent asthma with exacerbation    Rx / DC Orders ED Discharge Orders         Ordered    predniSONE (DELTASONE) 20 MG tablet  Daily     Discontinue  Reprint     12/26/19 1924           Jessika Rothery, Ambrose Finland,  MD 12/26/19 1929

## 2020-01-02 ENCOUNTER — Emergency Department (HOSPITAL_COMMUNITY)
Admission: EM | Admit: 2020-01-02 | Discharge: 2020-01-03 | Disposition: A | Discharge status: Home / Self Care | Payer: PRIVATE HEALTH INSURANCE | Attending: Emergency Medicine | Admitting: Emergency Medicine

## 2020-01-02 ENCOUNTER — Other Ambulatory Visit: Payer: Self-pay

## 2020-01-02 ENCOUNTER — Encounter (HOSPITAL_COMMUNITY): Payer: Self-pay | Admitting: Emergency Medicine

## 2020-01-02 DIAGNOSIS — F410 Panic disorder [episodic paroxysmal anxiety] without agoraphobia: Secondary | ICD-10-CM | POA: Diagnosis not present

## 2020-01-02 DIAGNOSIS — F419 Anxiety disorder, unspecified: Secondary | ICD-10-CM | POA: Diagnosis present

## 2020-01-02 DIAGNOSIS — Z79899 Other long term (current) drug therapy: Secondary | ICD-10-CM | POA: Insufficient documentation

## 2020-01-02 DIAGNOSIS — Z87891 Personal history of nicotine dependence: Secondary | ICD-10-CM | POA: Insufficient documentation

## 2020-01-02 DIAGNOSIS — J45901 Unspecified asthma with (acute) exacerbation: Secondary | ICD-10-CM | POA: Diagnosis not present

## 2020-01-02 DIAGNOSIS — I10 Essential (primary) hypertension: Secondary | ICD-10-CM | POA: Insufficient documentation

## 2020-01-02 NOTE — ED Provider Notes (Signed)
[facility]-EMERGENCY DEPT Provider Note   CSN: [id number] Arrival date & time: 01/02/20  2223     History Chief Complaint  Patient presents with  . Panic Attack    Andrea Daniels is a 29 y.o. female.  29 year old female presents with increased anxiety.  States that she was at work today and had just returned after being treated for asthma several days ago.  States that she start using a machine at work and came very anxious.  Had trouble breathing and was found to have clear breath sounds despite having a history of asthma.  Denies any SI or HI.  Feels better at this time        Past Medical History:  Diagnosis Date  . Asthma   . Gastritis   . Gastritis   . Hypertension   . Ovarian cyst   . UTI (lower urinary tract infection)     Patient Active Problem List   Diagnosis Date Noted  . Cyst of left ovary 12/17/2017  . Acute respiratory failure (HCC) 10/12/2016  . Acute respiratory failure with hypoxia (HCC) 10/11/2016  . Hypokalemia 10/11/2016  . Lobar pneumonia (HCC)   . CAP (community acquired pneumonia) 06/03/2016  . Epigastric abdominal pain   . Tachycardia 06/02/2016  . Leukocytosis 06/02/2016  . Community acquired pneumonia of right lung 06/02/2016  . Asthma 06/02/2016  . Hypoxia   . Hypophosphatemia 03/20/2015  . Low TSH level 03/20/2015  . Metabolic acidosis 03/20/2015  . Microcytic anemia 03/20/2015  . Exacerbation of asthma 03/19/2015  . Elevated glucose 07/03/2014  . Asthma with exacerbation 10/22/2013  . Hives 07/11/2013  . Status asthmaticus 06/08/2013  . Nausea vomiting and diarrhea 08/16/2012  . Allergic rhinitis 04/24/2012  . Preventative health care 08/14/2011    Past Surgical History:  Procedure Laterality Date  . NO PAST SURGERIES       OB History    Gravida  0   Para      Term      Preterm      AB      Living        SAB      TAB      Ectopic      Multiple      Live Births               Family History  Problem Relation Age of Onset  . Arthritis Other   . Cancer Other        breast cancer  . Hypertension Other   . Asthma Other   . Hyperlipidemia Other   . Hypertension Other   . Diabetes Other   . Arthritis Mother   . Hearing loss Mother   . Hypertension Mother   . Arthritis Father   . Hypertension Father   . Early death Brother   . Arthritis Maternal Grandmother   . Asthma Maternal Grandmother   . Cancer Maternal Grandmother   . Diabetes Maternal Grandmother   . Hearing loss Maternal Grandmother   . Hypertension Maternal Grandmother   . Kidney disease Paternal Grandmother   . Stroke Paternal Grandfather     Social History   Tobacco Use  . Smoking status: Former Smoker    Packs/day: 0.10    Types: Cigarettes  . Smokeless tobacco: Never Used  Vaping Use  . Vaping Use: Some days  Substance Use Topics  . Alcohol use: Yes    Comment: Once a week   . Drug  use: Yes    Types: Marijuana    Home Medications Prior to Admission medications   Medication Sig Start Date End Date Taking? Authorizing Provider  albuterol (PROVENTIL HFA;VENTOLIN HFA) 108 (90 Base) MCG/ACT inhaler Inhale 1-2 puffs into the lungs every 6 (six) hours as needed for wheezing or shortness of breath. 05/08/18   Mliss Sax, MD  amLODipine (NORVASC) 5 MG tablet Take 1 tablet (5 mg total) by mouth daily. 02/24/19   Horton, Mayer Masker, MD  aspirin-acetaminophen-caffeine (EXCEDRIN MIGRAINE) [phone] MG tablet Take 2 tablets by mouth every 6 (six) hours as needed for headache.    [provider]  budesonide-formoterol (SYMBICORT) 80-4.5 MCG/ACT inhaler Inhale 2 puffs into the lungs daily. 05/08/18   Mliss Sax, MD  cetirizine (ZYRTEC) 10 MG tablet Take 1 tablet (10 mg total) by mouth at bedtime. Patient not taking: Reported on 01/18/2018 09/18/17   Mliss Sax, MD  fluticasone [facility]) 50 MCG/ACT nasal spray Place 2 sprays into both nostrils  daily. Patient not taking: Reported on 01/18/2018 09/18/17   Mliss Sax, MD  meloxicam (MOBIC) 7.5 MG tablet Take 1-2 tablets (7.5-15 mg total) by mouth daily. Patient not taking: Reported on 01/18/2018 12/17/17   Cristina Gong, PA-C  montelukast (SINGULAIR) 10 MG tablet Take 1 tablet (10 mg total) by mouth at bedtime. Patient not taking: Reported on 06/03/2018 04/24/17   McDonald, Pedro Earls A, PA-C  predniSONE (DELTASONE) 20 MG tablet Take 2 tablets (40 mg total) by mouth daily. 12/26/19   Little, Ambrose Finland, MD  promethazine (PHENERGAN) 25 MG tablet Take 1 tablet (25 mg total) by mouth every 6 (six) hours as needed for nausea or vomiting. 06/03/18   Street, [place], PA-C    Allergies    Doxycycline  Review of Systems   Review of Systems  All other systems reviewed and are negative.   Physical Exam Updated Vital Signs BP (!) 157/118 (BP Location: Left Arm)   Pulse 93   Temp 98.2 F (36.8 C) (Oral)   Resp 18   LMP 12/22/2019 (Approximate)   SpO2 98%   Physical Exam Vitals and nursing note reviewed.  Constitutional:      General: She is not in acute distress.    Appearance: Normal appearance. She is well-developed. She is not toxic-appearing.  HENT:     Head: Normocephalic and atraumatic.  Eyes:     General: Lids are normal.     Conjunctiva/sclera: Conjunctivae normal.     Pupils: Pupils are equal, round, and reactive to light.  Neck:     Thyroid: No thyroid mass.     Trachea: No tracheal deviation.  Cardiovascular:     Rate and Rhythm: Normal rate and regular rhythm.     Heart sounds: Normal heart sounds. No murmur heard.  No gallop.   Pulmonary:     Effort: Pulmonary effort is normal. No respiratory distress.     Breath sounds: Normal breath sounds. No stridor. No decreased breath sounds, wheezing, rhonchi or rales.  Abdominal:     General: Bowel sounds are normal. There is no distension.     Palpations: Abdomen is soft.     Tenderness: There is no  abdominal tenderness. There is no rebound.  Musculoskeletal:        General: No tenderness. Normal range of motion.     Cervical back: Normal range of motion and neck supple.  Skin:    General: Skin is warm and dry.     Findings:  No abrasion or rash.  Neurological:     Mental Status: She is alert and oriented to person, place, and time.     GCS: GCS eye subscore is 4. GCS verbal subscore is 5. GCS motor subscore is 6.     Cranial Nerves: No cranial nerve deficit.     Sensory: No sensory deficit.  Psychiatric:        Attention and Perception: Attention normal.        Speech: Speech normal.        Behavior: Behavior normal.     ED Results / Procedures / Treatments   Labs (all labs ordered are listed, but only abnormal results are displayed) Labs Reviewed - No data to display  EKG None  Radiology No results found.  Procedures Procedures (including critical care time)  Medications Ordered in ED Medications - No data to display  ED Course  I have reviewed the triage vital signs and the nursing notes.  Pertinent labs & imaging results that were available during my care of the patient were reviewed by me and considered in my medical decision making (see chart for details).    MDM Rules/Calculators/A&P                         Patient here after having anxiety attack.]  Time.  Encouraged to follow-up with her doctor  Final Clinical Impression(s) / ED Diagnoses Final diagnoses:  None    Rx / DC Orders ED Discharge Orders    None       Lorre Nick, MD 01/02/20 2308

## 2020-01-02 NOTE — ED Triage Notes (Signed)
Patient reports anxiety attack today at work. Reports increased stress at work. States having continued symptoms of tingling all over since that time.

## 2020-01-12 ENCOUNTER — Other Ambulatory Visit: Payer: Self-pay

## 2020-01-12 ENCOUNTER — Encounter (HOSPITAL_COMMUNITY): Payer: Self-pay

## 2020-01-12 ENCOUNTER — Inpatient Hospital Stay (HOSPITAL_COMMUNITY)
Admission: EM | Admit: 2020-01-12 | Discharge: 2020-01-14 | DRG: 203 | Disposition: A | Discharge status: Home / Self Care | Payer: PRIVATE HEALTH INSURANCE | Attending: Family Medicine | Admitting: Family Medicine

## 2020-01-12 ENCOUNTER — Emergency Department (HOSPITAL_COMMUNITY): Payer: PRIVATE HEALTH INSURANCE

## 2020-01-12 DIAGNOSIS — Z8249 Family history of ischemic heart disease and other diseases of the circulatory system: Secondary | ICD-10-CM

## 2020-01-12 DIAGNOSIS — Z825 Family history of asthma and other chronic lower respiratory diseases: Secondary | ICD-10-CM

## 2020-01-12 DIAGNOSIS — N83209 Unspecified ovarian cyst, unspecified side: Secondary | ICD-10-CM | POA: Diagnosis present

## 2020-01-12 DIAGNOSIS — J4541 Moderate persistent asthma with (acute) exacerbation: Principal | ICD-10-CM | POA: Diagnosis present

## 2020-01-12 DIAGNOSIS — R0603 Acute respiratory distress: Secondary | ICD-10-CM

## 2020-01-12 DIAGNOSIS — Z87891 Personal history of nicotine dependence: Secondary | ICD-10-CM

## 2020-01-12 DIAGNOSIS — F410 Panic disorder [episodic paroxysmal anxiety] without agoraphobia: Secondary | ICD-10-CM | POA: Diagnosis present

## 2020-01-12 DIAGNOSIS — J45901 Unspecified asthma with (acute) exacerbation: Secondary | ICD-10-CM | POA: Diagnosis present

## 2020-01-12 DIAGNOSIS — R0902 Hypoxemia: Secondary | ICD-10-CM | POA: Diagnosis present

## 2020-01-12 DIAGNOSIS — I10 Essential (primary) hypertension: Secondary | ICD-10-CM | POA: Diagnosis present

## 2020-01-12 DIAGNOSIS — Z7951 Long term (current) use of inhaled steroids: Secondary | ICD-10-CM

## 2020-01-12 DIAGNOSIS — D509 Iron deficiency anemia, unspecified: Secondary | ICD-10-CM | POA: Diagnosis present

## 2020-01-12 DIAGNOSIS — Z79899 Other long term (current) drug therapy: Secondary | ICD-10-CM

## 2020-01-12 DIAGNOSIS — Z792 Long term (current) use of antibiotics: Secondary | ICD-10-CM

## 2020-01-12 DIAGNOSIS — E669 Obesity, unspecified: Secondary | ICD-10-CM | POA: Diagnosis present

## 2020-01-12 DIAGNOSIS — Z20822 Contact with and (suspected) exposure to covid-19: Secondary | ICD-10-CM | POA: Diagnosis present

## 2020-01-12 DIAGNOSIS — G8929 Other chronic pain: Secondary | ICD-10-CM | POA: Diagnosis present

## 2020-01-12 DIAGNOSIS — R109 Unspecified abdominal pain: Secondary | ICD-10-CM | POA: Diagnosis present

## 2020-01-12 DIAGNOSIS — Z881 Allergy status to other antibiotic agents status: Secondary | ICD-10-CM

## 2020-01-12 DIAGNOSIS — Z6834 Body mass index (BMI) 34.0-34.9, adult: Secondary | ICD-10-CM

## 2020-01-12 LAB — CBC WITH DIFFERENTIAL/PLATELET
Abs Immature Granulocytes: 0.02 10*3/uL (ref 0.00–0.07)
Basophils Absolute: 0.1 10*3/uL (ref 0.0–0.1)
Basophils Relative: 1 %
Eosinophils Absolute: 0.7 10*3/uL — ABNORMAL HIGH (ref 0.0–0.5)
Eosinophils Relative: 9 %
HCT: 39.1 % (ref 36.0–46.0)
Hemoglobin: 12.6 g/dL (ref 12.0–15.0)
Immature Granulocytes: 0 %
Lymphocytes Relative: 26 %
Lymphs Abs: 1.9 10*3/uL (ref 0.7–4.0)
MCH: 25.8 pg — ABNORMAL LOW (ref 26.0–34.0)
MCHC: 32.2 g/dL (ref 30.0–36.0)
MCV: 80.1 fL (ref 80.0–100.0)
Monocytes Absolute: 0.7 10*3/uL (ref 0.1–1.0)
Monocytes Relative: 10 %
Neutro Abs: 3.9 10*3/uL (ref 1.7–7.7)
Neutrophils Relative %: 54 %
Platelets: 408 10*3/uL — ABNORMAL HIGH (ref 150–400)
RBC: 4.88 MIL/uL (ref 3.87–5.11)
RDW: 13.8 % (ref 11.5–15.5)
WBC: 7.2 10*3/uL (ref 4.0–10.5)
nRBC: 0 % (ref 0.0–0.2)

## 2020-01-12 LAB — POC SARS CORONAVIRUS 2 AG -  ED: SARS Coronavirus 2 Ag: NEGATIVE

## 2020-01-12 LAB — BLOOD GAS, ARTERIAL
Acid-base deficit: 2.4 mmol/L — ABNORMAL HIGH (ref 0.0–2.0)
Bicarbonate: 21.3 mmol/L (ref 20.0–28.0)
FIO2: 100
O2 Saturation: 99.6 %
Patient temperature: 98.6
pCO2 arterial: 35.2 mmHg (ref 32.0–48.0)
pH, Arterial: 7.4 (ref 7.350–7.450)
pO2, Arterial: 157 mmHg — ABNORMAL HIGH (ref 83.0–108.0)

## 2020-01-12 LAB — BASIC METABOLIC PANEL
Anion gap: 9 (ref 5–15)
BUN: 10 mg/dL (ref 6–20)
CO2: 25 mmol/L (ref 22–32)
Calcium: 8.9 mg/dL (ref 8.9–10.3)
Chloride: 103 mmol/L (ref 98–111)
Creatinine, Ser: 0.9 mg/dL (ref 0.44–1.00)
GFR calc Af Amer: >60 mL/min (ref >60)
GFR calc non Af Amer: >60 mL/min (ref >60)
Glucose, Bld: 96 mg/dL (ref 70–99)
Potassium: 3.8 mmol/L (ref 3.5–5.1)
Sodium: 137 mmol/L (ref 135–145)

## 2020-01-12 LAB — SARS CORONAVIRUS 2 BY RT PCR (HOSPITAL ORDER, PERFORMED IN ~~LOC~~ HOSPITAL LAB): SARS Coronavirus 2: NEGATIVE

## 2020-01-12 LAB — I-STAT BETA HCG BLOOD, ED (MC, WL, AP ONLY): I-stat hCG, quantitative: <5 m[IU]/mL (ref <5)

## 2020-01-12 MED ORDER — MAGNESIUM SULFATE 2 GM/50ML IV SOLN
2.0000 g | Freq: Once | INTRAVENOUS | Status: AC
  Administered 2020-01-12: 2 g via INTRAVENOUS
  Filled 2020-01-12: qty 50

## 2020-01-12 MED ORDER — SODIUM CHLORIDE 0.9 % IV BOLUS
1000.0000 mL | Freq: Once | INTRAVENOUS | Status: AC
  Administered 2020-01-12: 1000 mL via INTRAVENOUS

## 2020-01-12 MED ORDER — ALBUTEROL SULFATE HFA 108 (90 BASE) MCG/ACT IN AERS
8.0000 | INHALATION_SPRAY | Freq: Once | RESPIRATORY_TRACT | Status: AC
  Administered 2020-01-12: 8 via RESPIRATORY_TRACT
  Filled 2020-01-12: qty 6.7

## 2020-01-12 MED ORDER — AEROCHAMBER PLUS FLO-VU MEDIUM MISC
1.0000 | Freq: Once | Status: AC
  Administered 2020-01-12: 1
  Filled 2020-01-12: qty 1

## 2020-01-12 MED ORDER — ALBUTEROL (5 MG/ML) CONTINUOUS INHALATION SOLN
10.0000 mg/h | INHALATION_SOLUTION | RESPIRATORY_TRACT | Status: DC
  Administered 2020-01-12: 10 mg/h via RESPIRATORY_TRACT
  Filled 2020-01-12: qty 20

## 2020-01-12 MED ORDER — METHYLPREDNISOLONE SODIUM SUCC 125 MG IJ SOLR
125.0000 mg | Freq: Once | INTRAMUSCULAR | Status: AC
  Administered 2020-01-12: 125 mg via INTRAVENOUS
  Filled 2020-01-12: qty 2

## 2020-01-12 NOTE — ED Triage Notes (Signed)
Patient arrived stating she began feeling short of breath about an hour ago. Family reports she has taken her albuterol and atrovent inhalers today around 3pm but has not had any since.

## 2020-01-12 NOTE — ED Provider Notes (Signed)
[facility]-EMERGENCY DEPT Provider Note   CSN: [id number] Arrival date & time: 01/12/20  1904     History Chief Complaint  Patient presents with   Asthma    Andrea Daniels is a 29 y.o. female.  Andrea Daniels is a 29 y.o. female with a history of asthma, hypertension, ovarian cyst, obesity, who presents to the emergency department with respiratory distress.  Patient states that her asthma has been flaring up over the past 2 days, started to get worse between 3 and 4 PM, she used albuterol and Atrovent without relief.  Breathing significantly worsened about an hour prior to arrival.  Upon arriving to the emergency department patient is tachypneic, tachycardic and unable to speak due to shortness of breath, herpartner provides the majority of the history stating that symptoms started this afternoon but worsened, she has had a similar problems 3 times before, and required intubation in 2018.  The last time this happened it was attributed to a panic attack.  Patient complaining of some pain over the left lower chest that is worse with breathing that started after her breathing got worse this evening.  She has had a nonproductive cough, no fevers or chills.  Patient has had her Covid vaccines.  No associated abdominal pain.  No lower extremity pain or swelling.  No previous history of PE or DVT.  No history of ACS.  She is followed by her PCP for moderate persistent asthma.        Past Medical History:  Diagnosis Date   Asthma    Gastritis    Gastritis    Hypertension    Ovarian cyst    UTI (lower urinary tract infection)     Patient Active Problem List   Diagnosis Date Noted   Asthma exacerbation 01/12/2020   Cyst of left ovary 12/17/2017   Acute respiratory failure (HCC) 10/12/2016   Acute respiratory failure with hypoxia (HCC) 10/11/2016   Hypokalemia 10/11/2016   Lobar pneumonia (HCC)    CAP (community acquired pneumonia) 06/03/2016    Epigastric abdominal pain    Tachycardia 06/02/2016   Leukocytosis 06/02/2016   Community acquired pneumonia of right lung 06/02/2016   Asthma 06/02/2016   Hypoxia    Hypophosphatemia 03/20/2015   Low TSH level 03/20/2015   Metabolic acidosis 03/20/2015   Microcytic anemia 03/20/2015   Exacerbation of asthma 03/19/2015   Elevated glucose 07/03/2014   Asthma with exacerbation 10/22/2013   Hives 07/11/2013   Status asthmaticus 06/08/2013   Nausea vomiting and diarrhea 08/16/2012   Allergic rhinitis 04/24/2012   Preventative health care 08/14/2011    Past Surgical History:  Procedure Laterality Date   NO PAST SURGERIES       OB History    Gravida  0   Para      Term      Preterm      AB      Living        SAB      TAB      Ectopic      Multiple      Live Births              Family History  Problem Relation Age of Onset   Arthritis Other    Cancer Other        breast cancer   Hypertension Other    Asthma Other    Hyperlipidemia Other    Hypertension Other    Diabetes Other  Arthritis Mother    Hearing loss Mother    Hypertension Mother    Arthritis Father    Hypertension Father    Early death Brother    Arthritis Maternal Grandmother    Asthma Maternal Grandmother    Cancer Maternal Grandmother    Diabetes Maternal Grandmother    Hearing loss Maternal Grandmother    Hypertension Maternal Grandmother    Kidney disease Paternal Grandmother    Stroke Paternal Grandfather     Social History   Tobacco Use   Smoking status: Former Smoker    Packs/day: 0.10    Types: Cigarettes   Smokeless tobacco: Never Used  Building services engineer Use: Some days  Substance Use Topics   Alcohol use: Yes    Comment: Once a week    Drug use: Yes    Types: Marijuana    Home Medications Prior to Admission medications   Medication Sig Start Date End Date Taking? Authorizing Provider  albuterol (PROVENTIL  HFA;VENTOLIN HFA) 108 (90 Base) MCG/ACT inhaler Inhale 1-2 puffs into the lungs every 6 (six) hours as needed for wheezing or shortness of breath. 05/08/18  Yes Mliss Sax, MD  amLODipine (NORVASC) 5 MG tablet Take 1 tablet (5 mg total) by mouth daily. 02/24/19  Yes Horton, Mayer Masker, MD  amoxicillin (AMOXIL) 500 MG tablet Take 500 mg by mouth 2 (two) times daily. 01/10/20  Yes [provider]  aspirin-acetaminophen-caffeine (EXCEDRIN MIGRAINE) [phone] MG tablet Take 2 tablets by mouth every 6 (six) hours as needed for headache.   Yes [provider]  budesonide-formoterol (SYMBICORT) 80-4.5 MCG/ACT inhaler Inhale 2 puffs into the lungs daily. 05/08/18  Yes Mliss Sax, MD  cetirizine (ZYRTEC) 10 MG tablet Take 1 tablet (10 mg total) by mouth at bedtime. Patient taking differently: Take 10 mg by mouth daily.  09/18/17  Yes Mliss Sax, MD  chlorpheniramine-HYDROcodone (TUSSIONEX) 10-8 MG/5ML SUER Take 5 mLs by mouth at bedtime as needed for cough.  01/10/20  Yes [provider]  hydrOXYzine (ATARAX/VISTARIL) 10 MG tablet Take 10 mg by mouth 3 (three) times daily as needed for itching or anxiety.  01/03/20  Yes [provider]  doxycycline (VIBRA-TABS) 100 MG tablet Take 100 mg by mouth 2 (two) times daily. Patient not taking: Reported on 01/12/2020 01/03/20   [provider]  fluticasone (FLONASE) 50 MCG/ACT nasal spray Place 2 sprays into both nostrils daily. Patient not taking: Reported on 01/18/2018 09/18/17   Mliss Sax, MD  meloxicam (MOBIC) 7.5 MG tablet Take 1-2 tablets (7.5-15 mg total) by mouth daily. Patient not taking: Reported on 01/18/2018 12/17/17   Cristina Gong, PA-C  montelukast (SINGULAIR) 10 MG tablet Take 1 tablet (10 mg total) by mouth at bedtime. Patient not taking: Reported on 06/03/2018 04/24/17   McDonald, Pedro Earls A, PA-C  predniSONE (DELTASONE) 20 MG tablet Take 2 tablets (40 mg total) by  mouth daily. Patient not taking: Reported on 01/12/2020 12/26/19   Little, Ambrose Finland, MD  promethazine (PHENERGAN) 25 MG tablet Take 1 tablet (25 mg total) by mouth every 6 (six) hours as needed for nausea or vomiting. Patient not taking: Reported on 01/12/2020 06/03/18   Street, [place], [place]    Allergies    Doxycycline  Review of Systems   Review of Systems  Constitutional: Negative for chills and fever.  HENT: Negative.   Respiratory: Positive for cough, chest tightness, shortness of breath and wheezing.   Cardiovascular: Positive for chest pain.  Negative for palpitations and leg swelling.  Gastrointestinal: Negative for abdominal pain, nausea and vomiting.  Genitourinary: Negative for dysuria and frequency.  Musculoskeletal: Negative for arthralgias and myalgias.  Skin: Negative for color change and rash.  Neurological: Negative for dizziness, syncope and light-headedness.  Psychiatric/Behavioral: The patient is nervous/anxious.   All other systems reviewed and are negative.   Physical Exam Updated Vital Signs BP (!) 152/123 (BP Location: Left Arm)    Pulse (!) 118    Temp 98.8 F (37.1 C) (Oral)    Resp (!) 28    Ht 5\' 1"  (1.549 m)    Wt 81.6 kg    LMP 12/22/2019 (Approximate)    SpO2 94%    BMI 34.01 kg/m   Physical Exam Vitals and nursing note reviewed.  Constitutional:      General: She is in acute distress.     Appearance: Normal appearance. She is well-developed. She is obese. She is ill-appearing. She is not diaphoretic.  HENT:     Head: Normocephalic and atraumatic.     Mouth/Throat:     Mouth: Mucous membranes are moist.     Pharynx: Oropharynx is clear.  Eyes:     General:        Right eye: No discharge.        Left eye: No discharge.     Pupils: Pupils are equal, round, and reactive to light.  Neck:     Trachea: No tracheal deviation.  Cardiovascular:     Rate and Rhythm: Regular rhythm. Tachycardia present.     Heart sounds: Normal heart sounds. No  murmur heard.  No friction rub. No gallop.      Comments: Tachycardia with regular rhythm Pulmonary:     Effort: Respiratory distress present.     Breath sounds: Decreased air movement present. No stridor. Wheezing present. No rales.     Comments: Patient arrives in respiratory distress, unable to speak, significantly tachypneic with respiratory rate in the 30s and accessory muscle use, although patient is not hypoxic, she has diffuse wheezing throughout bilateral lung fields with decreased air movement, some tenderness over the left lower chest wall Chest:     Chest wall: Tenderness present.  Abdominal:     General: Bowel sounds are normal. There is no distension.     Palpations: Abdomen is soft. There is no mass.     Tenderness: There is no abdominal tenderness. There is no guarding.     Comments: Abdomen soft, nondistended, nontender to palpation in all quadrants without guarding or peritoneal signs  Musculoskeletal:        General: No deformity.     Cervical back: Neck supple.     Right lower leg: No edema.     Left lower leg: No edema.  Skin:    General: Skin is warm and dry.     Capillary Refill: Capillary refill takes less than 2 seconds.  Neurological:     Mental Status: She is alert.     Coordination: Coordination normal.     Comments: Speech is clear, able to follow commands Moves extremities without ataxia, coordination intact  Psychiatric:        Mood and Affect: Mood is anxious.        Behavior: Behavior normal.     ED Results / Procedures / Treatments   Labs (all labs ordered are listed, but only abnormal results are displayed) Labs Reviewed  CBC WITH DIFFERENTIAL/PLATELET - Abnormal; Notable for the following components:  Result Value   MCH 25.8 (*)    Platelets 408 (*)    Eosinophils Absolute 0.7 (*)    All other components within normal limits  BLOOD GAS, ARTERIAL - Abnormal; Notable for the following components:   pO2, Arterial 157 (*)    Acid-base  deficit 2.4 (*)    All other components within normal limits  SARS CORONAVIRUS 2 BY RT PCR (HOSPITAL ORDER, PERFORMED IN [facility])  BASIC METABOLIC PANEL  I-STAT BETA HCG BLOOD, ED ([facility], [facility], [facility] ONLY)  POC SARS CORONAVIRUS 2 AG -  ED    EKG EKG Interpretation  Date/Time:  "Sunday January 12 2020 19:16:39 EDT Ventricular Rate:  119 PR Interval:    QRS Duration: 60 QT Interval:  340 QTC Calculation: 479 R Axis:   73 Text Interpretation: Sinus tachycardia Borderline T abnormalities, lateral leads Borderline prolonged QT interval 12 Lead; [place] Since last tracing rate slower Confirmed by Yao, David H ([id number]) on 01/12/2020 7:23:19 PM   Radiology DG Chest Port 1 View  Result Date: 01/12/2020 CLINICAL DATA:  Shortness of breath EXAM: PORTABLE CHEST 1 VIEW COMPARISON:  None. FINDINGS: The heart size and mediastinal contours are within normal limits. Both lungs are clear. The visualized skeletal structures are unremarkable. IMPRESSION: No active disease. Electronically Signed   By: Bindu  Avutu M.D.   On: 01/12/2020 19:45    Procedures .Critical Care Performed by: Ford, Kloey Cazarez N, PA-C Authorized by: Ford, Maricela Kawahara N, PA-C   Critical care provider statement:    Critical care time (minutes):  45   Critical care was necessary to treat or prevent imminent or life-threatening deterioration of the following conditions:  Respiratory failure   Critical care was time spent personally by me on the following activities:  Discussions with consultants, evaluation of patient's response to treatment, examination of patient, ordering and performing treatments and interventions, ordering and review of laboratory studies, ordering and review of radiographic studies, pulse oximetry, re-evaluation of patient's condition, obtaining history from patient or surrogate and review of old charts   (including critical care time)  Medications Ordered in ED Medications  albuterol (PROVENTIL,VENTOLIN)  solution continuous neb (10 mg/hr Nebulization New Bag/Given 01/12/20 2129)  magnesium sulfate IVPB 2 g 50 mL (2 g Intravenous New Bag/Given 01/12/20 2005)  methylPREDNISolone sodium succinate (SOLU-MEDROL) 125 mg/2 mL injection 125 mg (125 mg Intravenous Given 01/12/20 2001)  albuterol (VENTOLIN HFA) 108 (90 Base) MCG/ACT inhaler 8 puff (8 puffs Inhalation Given 01/12/20 2001)  AeroChamber Plus Flo-Vu Medium MISC 1 each (1 each Other Given 01/12/20 2006)  sodium chloride 0.9 % bolus 1,000 mL (1,000 mLs Intravenous New Bag/Given 01/12/20 2005)    ED Course  I have reviewed the triage vital signs and the nursing notes.  Pertinent labs & imaging results that were available during my care of the patient were reviewed by me and considered in my medical decision making (see chart for details).    MDM Rules/Calculators/A&P                         29"  year old female arrives in respiratory distress, tachypneic, with increased respiratory effort, unable to speak, on lung exam she had decreased air movement, but wheezes noted throughout all lung fields.  Suspect this is related to patient's asthma especially given diffuse wheezing.  Patient given 8 puffs of albuterol and started on IV Solu-Medrol and magnesium, she was placed on nonrebreather to help improve her respiratory effort but was  not hypoxic on arrival.  Patient reported left chest pain but this resolved with improvement in her work of breathing feel this is unlikely ACS or PE.  Patient's EKG without any ischemic changes.  Patient is tachycardic on arrival but suspect this is due to increased respiratory effort and use of bronchodilators.  No risk factors for PE.  Will get basic labs, chest x-ray and Covid test.  Low suspicion for Covid as patient is fully vaccinated.  After initial medications patient is already improving with much better respiratory rate and effort and much better air movement, she is now able to speak.  Rapid Covid negative, will order  1 hour continuous albuterol neb.  Patient's chest x-ray is clear with no active cardiopulmonary disease. ABG with normal pH, PCO2 35, PO2 157 which is to be expected as patient is currently on O2. CBC with no leukocytosis and normal hemoglobin BMP with no acute electrolyte derangements and normal renal function Negative pregnancy  Patient continues to improve admission and monitoring given significant respiratory distress on arrival.  Case discussed with Dr. Toniann Fail with [facility] hospitalist who will see and admit the patient.  Final Clinical Impression(s) / ED Diagnoses Final diagnoses:  Moderate persistent asthma with exacerbation  Respiratory distress    Rx / DC Orders ED Discharge Orders    None       Legrand Rams 01/12/20 2314    Charlynne Pander, MD 01/12/20 2329

## 2020-01-13 ENCOUNTER — Encounter (HOSPITAL_COMMUNITY): Payer: Self-pay | Admitting: Internal Medicine

## 2020-01-13 DIAGNOSIS — I10 Essential (primary) hypertension: Secondary | ICD-10-CM | POA: Diagnosis present

## 2020-01-13 DIAGNOSIS — E669 Obesity, unspecified: Secondary | ICD-10-CM | POA: Diagnosis present

## 2020-01-13 DIAGNOSIS — N83209 Unspecified ovarian cyst, unspecified side: Secondary | ICD-10-CM | POA: Diagnosis present

## 2020-01-13 DIAGNOSIS — F410 Panic disorder [episodic paroxysmal anxiety] without agoraphobia: Secondary | ICD-10-CM | POA: Diagnosis present

## 2020-01-13 DIAGNOSIS — R0603 Acute respiratory distress: Secondary | ICD-10-CM | POA: Diagnosis present

## 2020-01-13 DIAGNOSIS — Z87891 Personal history of nicotine dependence: Secondary | ICD-10-CM | POA: Diagnosis not present

## 2020-01-13 DIAGNOSIS — R109 Unspecified abdominal pain: Secondary | ICD-10-CM | POA: Diagnosis present

## 2020-01-13 DIAGNOSIS — Z792 Long term (current) use of antibiotics: Secondary | ICD-10-CM | POA: Diagnosis not present

## 2020-01-13 DIAGNOSIS — J4541 Moderate persistent asthma with (acute) exacerbation: Secondary | ICD-10-CM | POA: Diagnosis present

## 2020-01-13 DIAGNOSIS — Z825 Family history of asthma and other chronic lower respiratory diseases: Secondary | ICD-10-CM | POA: Diagnosis not present

## 2020-01-13 DIAGNOSIS — Z6834 Body mass index (BMI) 34.0-34.9, adult: Secondary | ICD-10-CM | POA: Diagnosis not present

## 2020-01-13 DIAGNOSIS — Z79899 Other long term (current) drug therapy: Secondary | ICD-10-CM | POA: Diagnosis not present

## 2020-01-13 DIAGNOSIS — D509 Iron deficiency anemia, unspecified: Secondary | ICD-10-CM | POA: Diagnosis present

## 2020-01-13 DIAGNOSIS — Z8249 Family history of ischemic heart disease and other diseases of the circulatory system: Secondary | ICD-10-CM | POA: Diagnosis not present

## 2020-01-13 DIAGNOSIS — R0902 Hypoxemia: Secondary | ICD-10-CM | POA: Diagnosis present

## 2020-01-13 DIAGNOSIS — Z7951 Long term (current) use of inhaled steroids: Secondary | ICD-10-CM | POA: Diagnosis not present

## 2020-01-13 DIAGNOSIS — Z20822 Contact with and (suspected) exposure to covid-19: Secondary | ICD-10-CM | POA: Diagnosis present

## 2020-01-13 DIAGNOSIS — G8929 Other chronic pain: Secondary | ICD-10-CM | POA: Diagnosis present

## 2020-01-13 DIAGNOSIS — Z881 Allergy status to other antibiotic agents status: Secondary | ICD-10-CM | POA: Diagnosis not present

## 2020-01-13 LAB — BASIC METABOLIC PANEL
Anion gap: 17 — ABNORMAL HIGH (ref 5–15)
BUN: 8 mg/dL (ref 6–20)
CO2: 15 mmol/L — ABNORMAL LOW (ref 22–32)
Calcium: 9.1 mg/dL (ref 8.9–10.3)
Chloride: 104 mmol/L (ref 98–111)
Creatinine, Ser: 0.9 mg/dL (ref 0.44–1.00)
GFR calc Af Amer: >60 mL/min (ref >60)
GFR calc non Af Amer: >60 mL/min (ref >60)
Glucose, Bld: 206 mg/dL — ABNORMAL HIGH (ref 70–99)
Potassium: 3.9 mmol/L (ref 3.5–5.1)
Sodium: 136 mmol/L (ref 135–145)

## 2020-01-13 LAB — CBC
HCT: 39.8 % (ref 36.0–46.0)
Hemoglobin: 12.7 g/dL (ref 12.0–15.0)
MCH: 25.6 pg — ABNORMAL LOW (ref 26.0–34.0)
MCHC: 31.9 g/dL (ref 30.0–36.0)
MCV: 80.1 fL (ref 80.0–100.0)
Platelets: 426 10*3/uL — ABNORMAL HIGH (ref 150–400)
RBC: 4.97 MIL/uL (ref 3.87–5.11)
RDW: 14.2 % (ref 11.5–15.5)
WBC: 7.5 10*3/uL (ref 4.0–10.5)
nRBC: 0 % (ref 0.0–0.2)

## 2020-01-13 LAB — HIV ANTIBODY (ROUTINE TESTING W REFLEX): HIV Screen 4th Generation wRfx: NONREACTIVE

## 2020-01-13 MED ORDER — MOMETASONE FURO-FORMOTEROL FUM 200-5 MCG/ACT IN AERO
2.0000 | INHALATION_SPRAY | Freq: Two times a day (BID) | RESPIRATORY_TRACT | Status: DC
  Administered 2020-01-13 – 2020-01-14 (×3): 2 via RESPIRATORY_TRACT
  Filled 2020-01-13: qty 8.8

## 2020-01-13 MED ORDER — HYDROXYZINE HCL 25 MG PO TABS
25.0000 mg | ORAL_TABLET | Freq: Three times a day (TID) | ORAL | Status: DC | PRN
  Administered 2020-01-13 (×2): 25 mg via ORAL
  Filled 2020-01-13 (×2): qty 1

## 2020-01-13 MED ORDER — ACETAMINOPHEN 325 MG PO TABS
650.0000 mg | ORAL_TABLET | Freq: Four times a day (QID) | ORAL | Status: DC | PRN
  Administered 2020-01-13: 650 mg via ORAL
  Filled 2020-01-13: qty 2

## 2020-01-13 MED ORDER — METHYLPREDNISOLONE SODIUM SUCC 125 MG IJ SOLR
40.0000 mg | Freq: Two times a day (BID) | INTRAMUSCULAR | Status: DC
  Administered 2020-01-13 – 2020-01-14 (×3): 40 mg via INTRAVENOUS
  Filled 2020-01-13 (×3): qty 2

## 2020-01-13 MED ORDER — GUAIFENESIN 100 MG/5ML PO SOLN
5.0000 mL | ORAL | Status: DC | PRN
  Administered 2020-01-13 (×4): 100 mg via ORAL
  Filled 2020-01-13 (×5): qty 10

## 2020-01-13 MED ORDER — ALBUTEROL SULFATE (2.5 MG/3ML) 0.083% IN NEBU
2.5000 mg | INHALATION_SOLUTION | RESPIRATORY_TRACT | Status: DC | PRN
  Administered 2020-01-13: 2.5 mg via RESPIRATORY_TRACT
  Filled 2020-01-13: qty 3

## 2020-01-13 MED ORDER — AMLODIPINE BESYLATE 5 MG PO TABS
5.0000 mg | ORAL_TABLET | Freq: Every day | ORAL | Status: DC
  Administered 2020-01-13 – 2020-01-14 (×2): 5 mg via ORAL
  Filled 2020-01-13 (×2): qty 1

## 2020-01-13 MED ORDER — HYDROXYZINE HCL 10 MG PO TABS
10.0000 mg | ORAL_TABLET | Freq: Three times a day (TID) | ORAL | Status: DC | PRN
  Administered 2020-01-13: 10 mg via ORAL
  Filled 2020-01-13 (×2): qty 1

## 2020-01-13 MED ORDER — ALBUTEROL SULFATE (2.5 MG/3ML) 0.083% IN NEBU: 2.5000 mg | INHALATION_SOLUTION | RESPIRATORY_TRACT | Status: DC

## 2020-01-13 MED ORDER — ONDANSETRON HCL 4 MG/2ML IJ SOLN
4.0000 mg | Freq: Four times a day (QID) | INTRAMUSCULAR | Status: DC | PRN
  Administered 2020-01-13 (×2): 4 mg via INTRAVENOUS
  Filled 2020-01-13 (×2): qty 2

## 2020-01-13 MED ORDER — BUDESONIDE 0.25 MG/2ML IN SUSP
0.2500 mg | Freq: Two times a day (BID) | RESPIRATORY_TRACT | Status: DC
  Administered 2020-01-13: 0.25 mg via RESPIRATORY_TRACT
  Filled 2020-01-13: qty 2

## 2020-01-13 MED ORDER — ONDANSETRON HCL 4 MG PO TABS: 4.0000 mg | ORAL_TABLET | Freq: Four times a day (QID) | ORAL | Status: DC | PRN

## 2020-01-13 MED ORDER — IPRATROPIUM BROMIDE 0.02 % IN SOLN: 0.5000 mg | RESPIRATORY_TRACT | Status: DC

## 2020-01-13 MED ORDER — ENOXAPARIN SODIUM 40 MG/0.4ML ~~LOC~~ SOLN
40.0000 mg | SUBCUTANEOUS | Status: DC
  Administered 2020-01-13 – 2020-01-14 (×2): 40 mg via SUBCUTANEOUS
  Filled 2020-01-13 (×2): qty 0.4

## 2020-01-13 MED ORDER — IPRATROPIUM-ALBUTEROL 0.5-2.5 (3) MG/3ML IN SOLN
3.0000 mL | Freq: Four times a day (QID) | RESPIRATORY_TRACT | Status: DC
  Administered 2020-01-13 – 2020-01-14 (×5): 3 mL via RESPIRATORY_TRACT
  Filled 2020-01-13 (×5): qty 3

## 2020-01-13 NOTE — Progress Notes (Signed)
Called to patient's room for complaints of Dyspnea, patient saturation 97% on room air, audible wheezing and work of breathing, lungs clear with auscultation, patient received PRN Atarax for anxiety, consulted with respiratory. Per respiratory patient can receive an albuterol nebulizer at 0800 as patient's wheezing is unlikely to be impacted by that mode of therapy. Explained to patient the indications for nebulizer, and patient acknowledged understanding, patient placed on 2LO2 [place] for comfort, and instructed on pursed lip breathing technique. Patient placed in chair, notable improvement in respiratory rate and wheezing with interventions, patient awaiting nebulizer treatment at this time.

## 2020-01-13 NOTE — Progress Notes (Signed)
PROGRESS NOTE    Andrea Daniels  TKZ:[id number] DOB: 06-07-1991 DOA: 01/12/2020 PCP: Mliss Sax, MD  Brief Narrative:  29 year old black female with underlying asthma Moderate to severe anxiety Borderline abnormal TSH in the past Microcytic anemia probably secondary to periods Chronic abdominal pain with frequent ED visits in the past Prior tobacco abuse Admitted earlier this morning by my partner Dr. Toniann Fail who she followed up with for wheezing was given antibiotics etc. She was afebrile hypoxic and wheezing Covid was negative chest x-ray normal  Assessment & Plan:   Principal Problem:   Asthma exacerbation Active Problems:   Essential hypertension   1. Acute asthma exacerbation i. I do not see PFTs in the chart and I am not sure if she has only asthma or whether she may have some component of COPD as she used to be a smoker b. Continue Solu-Medrol every 12, albuterol every 2 as needed 2.5 DuoNebs 3 mill every 6 and Dulera 2 puffs twice daily in addition to guaifenesin c. She does not seem to have objective wheezing although she has decreased air entry bilaterally d. Would monitor her closely on progressive unit 2. Severe anxiety a. Went into the room and she was huffing and puffing and was unable to keep her eyes open however was also able to verbalize in full sentences and pull up in the chair b. She is on hydroxyzine 10 mg 3 times a day which I will increase to 25 c. She may require some Ativan if she continues to be anxious and nursing is aware to check in on her later today 3. Hypertension a. Continue amlodipine 5 mg  4. microcytic anemia in the past a. Trends are good no further work-up 5. BMI >34 6. Chronic abdominal pain?  DVT prophylaxis: Lovenox Code Status: Full Family Communication: Discussed with mother and the father at the bedside Disposition: Inpatient  Status is: Observation  The patient will require care spanning > 2 midnights and should  be moved to inpatient because: Unsafe d/c plan  Dispo: The patient is from: Home              Anticipated d/c is to: Home              Anticipated d/c date is: 2 days              Patient currently is medically stable to d/c.       Consultants:   None none  Procedures: no  Antimicrobials:    Subjective:  She is quite tired but otherwise feels well When I walked into the room she was huffing and puffing but this seems to resolve quite rapidly when she sits up  Examination:  BP 127/70 (BP Location: Left Arm)   Pulse (!) 120   Temp 99.1 F (37.3 C) (Oral)   Resp 20   Ht 5\' 1"  (1.549 m)   Wt 81.6 kg   LMP 12/22/2019 (Approximate)   SpO2 96%   BMI 34.01 kg/m    General exam: Awake pleasant no distress Respiratory system: No wheeze no rales no rhonchi Cardiovascular system: S1-S2 no murmur rub or gallop-heart rate however sinus tach in the 120s Gastrointestinal system: Soft obese nontender no rebound. Central nervous system: Awake coherent Extremities: No lower extremity edema Skin: Intact Psychiatry: Anxious   Scheduled Meds: . amLODipine  5 mg Oral Daily  . enoxaparin (LOVENOX) injection  40 mg Subcutaneous Q24H  . ipratropium-albuterol  3 mL Nebulization Q6H  .  methylPREDNISolone (SOLU-MEDROL) injection  40 mg Intravenous Q12H  . mometasone-formoterol  2 puff Inhalation BID   Continuous Infusions:   LOS: 0 days    Time spent: 15 minutes No charge note as admitted after midnight  Rhetta Mura, MD [facility] To contact the attending provider between 7A-7P or the covering provider during after hours 7P-7A, please log into the web site [email or web address] and access using universal [place] password for that web site. If you do not have the password, please call the hospital operator.  01/13/2020, 12:30 PM

## 2020-01-13 NOTE — H&P (Signed)
History and Physical    Andrea Daniels:[id number] DOB: May 31, 1991 DOA: 01/12/2020  PCP: Mliss Sax, MD  Patient coming from: Home.  Chief Complaint: Shortness of breath.  HPI: Andrea Daniels is a 29 y.o. female with history of bronchial asthma, hypertension presents to the ER for the third time in the last 3 weeks.  Patient had come on December 26, 2019 for asthma attack and was prescribed inhalers and steroids.  Had come in the following week on January 02, 2020 with panic attack.  Patient also had followed up with a primary care physician for wheezing and was prescribed antibiotics in addition.  Despite which patient states wheezing is to be getting worsening.  Patient states she had a recent change in the job where she had to go to another assignment over the last 1 month and since then her attacks have become more frequent.  Denies chest pain fever chills has some mild productive cough.  ED Course: In the ER patient was afebrile hypoxic and wheezing.  Chest x-ray was unremarkable Covid test was negative and EKG shows sinus tachycardia.  Labs were largely unremarkable.  Despite giving nebulizer treatment and steroids patient was still wheezing admitted for further observation.  Review of Systems: As per HPI, rest all negative.   Past Medical History:  Diagnosis Date  . Asthma   . Gastritis   . Gastritis   . Hypertension   . Ovarian cyst   . UTI (lower urinary tract infection)     Past Surgical History:  Procedure Laterality Date  . NO PAST SURGERIES       reports that she has quit smoking. Her smoking use included cigarettes. She smoked 0.10 packs per day. She has never used smokeless tobacco. She reports current alcohol use. She reports current drug use. Drug: Marijuana.  Allergies  Allergen Reactions  . Doxycycline Rash    Family History  Problem Relation Age of Onset  . Arthritis Other   . Cancer Other        breast cancer  . Hypertension Other   . Asthma  Other   . Hyperlipidemia Other   . Hypertension Other   . Diabetes Other   . Arthritis Mother   . Hearing loss Mother   . Hypertension Mother   . Arthritis Father   . Hypertension Father   . Early death Brother   . Arthritis Maternal Grandmother   . Asthma Maternal Grandmother   . Diabetes Maternal Grandmother   . Hearing loss Maternal Grandmother   . Hypertension Maternal Grandmother   . Breast cancer Maternal Grandmother   . Congestive Heart Failure Maternal Grandmother   . Kidney disease Paternal Grandmother   . Stroke Paternal Grandfather   . Hypertension Maternal Grandfather     Prior to Admission medications   Medication Sig Start Date End Date Taking? Authorizing Provider  albuterol (PROVENTIL HFA;VENTOLIN HFA) 108 (90 Base) MCG/ACT inhaler Inhale 1-2 puffs into the lungs every 6 (six) hours as needed for wheezing or shortness of breath. 05/08/18  Yes Mliss Sax, MD  amLODipine (NORVASC) 5 MG tablet Take 1 tablet (5 mg total) by mouth daily. 02/24/19  Yes Horton, Mayer Masker, MD  amoxicillin (AMOXIL) 500 MG tablet Take 500 mg by mouth 2 (two) times daily. 01/10/20  Yes [provider]  aspirin-acetaminophen-caffeine (EXCEDRIN MIGRAINE) [phone] MG tablet Take 2 tablets by mouth every 6 (six) hours as needed for headache.   Yes [provider]  budesonide-formoterol (  SYMBICORT) 80-4.5 MCG/ACT inhaler Inhale 2 puffs into the lungs daily. 05/08/18  Yes Mliss SaxKremer, William Alfred, MD  cetirizine (ZYRTEC) 10 MG tablet Take 1 tablet (10 mg total) by mouth at bedtime. Patient taking differently: Take 10 mg by mouth daily.  09/18/17  Yes Mliss SaxKremer, William Alfred, MD  chlorpheniramine-HYDROcodone (TUSSIONEX) 10-8 MG/5ML SUER Take 5 mLs by mouth at bedtime as needed for cough.  01/10/20  Yes [provider]  hydrOXYzine (ATARAX/VISTARIL) 10 MG tablet Take 10 mg by mouth 3 (three) times daily as needed for itching or anxiety.  01/03/20  Yes [provider]  doxycycline (VIBRA-TABS) 100 MG tablet Take 100 mg by mouth 2 (two) times daily. Patient not taking: Reported on 01/12/2020 01/03/20   [provider]  fluticasone (FLONASE) 50 MCG/ACT nasal spray Place 2 sprays into both nostrils daily. Patient not taking: Reported on 01/18/2018 09/18/17   Mliss SaxKremer, William Alfred, MD  meloxicam (MOBIC) 7.5 MG tablet Take 1-2 tablets (7.5-15 mg total) by mouth daily. Patient not taking: Reported on 01/18/2018 12/17/17   Cristina GongHammond, Elizabeth W, PA-C  montelukast (SINGULAIR) 10 MG tablet Take 1 tablet (10 mg total) by mouth at bedtime. Patient not taking: Reported on 06/03/2018 04/24/17   McDonald, Pedro EarlsMia A, PA-C  predniSONE (DELTASONE) 20 MG tablet Take 2 tablets (40 mg total) by mouth daily. Patient not taking: Reported on 01/12/2020 12/26/19   Little, Ambrose Finlandachel Morgan, MD  promethazine (PHENERGAN) 25 MG tablet Take 1 tablet (25 mg total) by mouth every 6 (six) hours as needed for nausea or vomiting. Patient not taking: Reported on 01/12/2020 06/03/18   Street, WhitehavenMercedes, New JerseyPA-C    Physical Exam: Constitutional: Moderately built and nourished. Vitals:   01/12/20 2129 01/12/20 2150 01/12/20 2200 01/12/20 2346  BP:  (!) 154/96 (!) 146/99 (!) 159/87  Pulse:  (!) 103 (!) 104 (!) 123  Resp:  13  20  Temp:    98.3 F (36.8 C)  TempSrc:    Oral  SpO2: 100% 95% 100% 97%  Weight:      Height:       Eyes: Anicteric no pallor. ENMT: No discharge from the ears eyes nose or mouth. Neck: No mass felt.  No neck rigidity. Respiratory: Bilateral expiratory wheeze and no crepitations. Cardiovascular: S1-S2 heard. Abdomen: Soft nontender bowel sounds present. Musculoskeletal: No edema. Skin: No rash. Neurologic: Alert awake oriented to time place and person.  Moves all extremities. Psychiatric: Appears normal.  Normal affect.   Labs on Admission: I have personally reviewed following labs and imaging studies  CBC: Recent Labs  Lab 01/12/20 1930  WBC 7.2    NEUTROABS 3.9  HGB 12.6  HCT 39.1  MCV 80.1  PLT 408*   Basic Metabolic Panel: Recent Labs  Lab 01/12/20 1930  NA 137  K 3.8  CL 103  CO2 25  GLUCOSE 96  BUN 10  CREATININE 0.90  CALCIUM 8.9   GFR: Estimated Creatinine Clearance: 88.4 mL/min (by C-G formula based on SCr of 0.9 mg/dL). Liver Function Tests: No results for input(s): AST, ALT, ALKPHOS, BILITOT, PROT, ALBUMIN in the last 168 hours. No results for input(s): LIPASE, AMYLASE in the last 168 hours. No results for input(s): AMMONIA in the last 168 hours. Coagulation Profile: No results for input(s): INR, PROTIME in the last 168 hours. Cardiac Enzymes: No results for input(s): CKTOTAL, CKMB, CKMBINDEX, TROPONINI in the last 168 hours. BNP (last 3 results) No results for input(s): PROBNP in the last 8760 hours. HbA1C: No results  for input(s): HGBA1C in the last 72 hours. CBG: No results for input(s): GLUCAP in the last 168 hours. Lipid Profile: No results for input(s): CHOL, HDL, LDLCALC, TRIG, CHOLHDL, LDLDIRECT in the last 72 hours. Thyroid Function Tests: No results for input(s): TSH, T4TOTAL, FREET4, T3FREE, THYROIDAB in the last 72 hours. Anemia Panel: No results for input(s): VITAMINB12, FOLATE, FERRITIN, TIBC, IRON, RETICCTPCT in the last 72 hours. Urine analysis:    Component Value Date/Time   COLORURINE YELLOW 02/24/2019 0054   APPEARANCEUR HAZY (A) 02/24/2019 0054   LABSPEC 1.024 02/24/2019 0054   PHURINE 7.0 02/24/2019 0054   GLUCOSEU NEGATIVE 02/24/2019 0054   GLUCOSEU NEGATIVE 08/22/2017 0957   HGBUR NEGATIVE 02/24/2019 0054   BILIRUBINUR NEGATIVE 02/24/2019 0054   KETONESUR NEGATIVE 02/24/2019 0054   PROTEINUR NEGATIVE 02/24/2019 0054   UROBILINOGEN 0.2 08/22/2017 0957   NITRITE NEGATIVE 02/24/2019 0054   LEUKOCYTESUR NEGATIVE 02/24/2019 0054   Sepsis Labs: @LABRCNTIP (procalcitonin:4,lacticidven:4) ) Recent Results (from the past 240 hour(s))  SARS Coronavirus 2 by RT PCR (hospital  order, performed in [facility] hospital lab) Nasopharyngeal Nasopharyngeal Swab     Status: None   Collection Time: 01/12/20  8:09 PM   Specimen: Nasopharyngeal Swab  Result Value Ref Range Status   SARS Coronavirus 2 NEGATIVE NEGATIVE Final    Comment: (NOTE) SARS-CoV-2 target nucleic acids are NOT DETECTED.  The SARS-CoV-2 RNA is generally detectable in upper and lower respiratory specimens during the acute phase of infection. The lowest concentration of SARS-CoV-2 viral copies this assay can detect is 250 copies / mL. A negative result does not preclude SARS-CoV-2 infection and should not be used as the sole basis for treatment or other patient management decisions.  A negative result may occur with improper specimen collection / handling, submission of specimen other than nasopharyngeal swab, presence of viral mutation(s) within the areas targeted by this assay, and inadequate number of viral copies (<250 copies / mL). A negative result must be combined with clinical observations, patient history, and epidemiological information.  Fact Sheet for Patients:   01/14/20  Fact Sheet for Healthcare Providers: BoilerBrush.com.cy  This test is not yet approved or  cleared by the https://pope.com/ FDA and has been authorized for detection and/or diagnosis of SARS-CoV-2 by FDA under an Emergency Use Authorization (EUA).  This EUA will remain in effect (meaning this test can be used) for the duration of the COVID-19 declaration under Section 564(b)(1) of the Act, 21 U.S.C. section 360bbb-3(b)(1), unless the authorization is terminated or revoked sooner.  Performed at [facility], [address], [place], [place]      Radiological Exams on Admission: DG Chest Port 1 View  Result Date: 01/12/2020 CLINICAL DATA:  Shortness of breath EXAM: PORTABLE CHEST 1 VIEW COMPARISON:  None. FINDINGS: The heart size  and mediastinal contours are within normal limits. Both lungs are clear. The visualized skeletal structures are unremarkable. IMPRESSION: No active disease. Electronically Signed   By: 01/14/2020 M.D.   On: 01/12/2020 19:45    EKG: Independently reviewed.  Sinus tachycardia.  Assessment/Plan Principal Problem:   Asthma exacerbation Active Problems:   Essential hypertension    1. Acute asthma exacerbation for which I have placed patient on IV Solu-Medrol nebulizer Pulmicort.  Closely monitor respiratory status.  This time of my exam patient is able to complete sentences without difficulty.  Patient has concerns about her workplace and asthma attack.  Patient may need referral to pulmonologist at discharge. 2. Hypertension  usually takes amlodipine but patient states she has not been taking it regularly.  Patient is okay to restart amlodipine.  Closely follow blood pressure trends. 3. Obesity -will need nutrition counseling.   DVT prophylaxis: Lovenox. Code Status: Full code. Family Communication: Discussed with patient. Disposition Plan: Home. Consults called: None. Admission status: Observation.   Eduard Clos MD [facility] Pager [phone].  If 7PM-7AM, please contact night-coverage [email or web address] Password [facility]  01/13/2020, 2:35 AM

## 2020-01-13 NOTE — Plan of Care (Signed)

## 2020-01-14 ENCOUNTER — Encounter: Payer: Self-pay | Admitting: Family Medicine

## 2020-01-14 MED ORDER — PREDNISONE 50 MG PO TABS
60.0000 mg | ORAL_TABLET | Freq: Every day | ORAL | Status: DC
  Administered 2020-01-14: 60 mg via ORAL
  Filled 2020-01-14: qty 1

## 2020-01-14 MED ORDER — MONTELUKAST SODIUM 10 MG PO TABS: 10.0000 mg | ORAL_TABLET | Freq: Every day | ORAL | 0 refills | Status: AC

## 2020-01-14 MED ORDER — GUAIFENESIN 100 MG/5ML PO SOLN: 5.0000 mL | ORAL | 0 refills | Status: AC | PRN

## 2020-01-14 MED ORDER — PREDNISONE 20 MG PO TABS
40.0000 mg | ORAL_TABLET | Freq: Every day | ORAL | 0 refills | Status: AC
Start: 2020-01-14 — End: 2020-01-17

## 2020-01-14 MED ORDER — HYDROXYZINE HCL 25 MG PO TABS: 25.0000 mg | ORAL_TABLET | Freq: Two times a day (BID) | ORAL | 0 refills | Status: AC | PRN

## 2020-01-14 MED ORDER — MOMETASONE FURO-FORMOTEROL FUM 200-5 MCG/ACT IN AERO: 2.0000 | INHALATION_SPRAY | Freq: Two times a day (BID) | RESPIRATORY_TRACT | 0 refills | Status: AC

## 2020-01-14 NOTE — Plan of Care (Signed)
Discharge instructions reviewed with patient, questions answered, verbalized understanding.  Patient transported via wheelchair to main entrance to be taken home by significant other.  Work note given to patient prior to discharge.

## 2020-01-14 NOTE — TOC Progression Note (Signed)
Transition of Care [facility]) - Progression Note    Patient Details  Name: Andrea Daniels MRN: [id number] Date of Birth: July 22, 1990  [facility] CM/SW Contact  Geni Bers, RN Phone Number: 01/14/2020, 12:18 PM  Clinical Narrative:     Discharged home with no needs.        Expected Discharge Plan and Services           Expected Discharge Date: 01/14/20                                     Social Determinants of Health (SDOH) Interventions    Readmission Risk Interventions No flowsheet data found.

## 2020-01-14 NOTE — Discharge Summary (Signed)
Physician Discharge Summary  LEONARD HENDLER OJJ:[id number] DOB: [birth year] DOA: 01/12/2020  PCP: Mliss Sax, MD  Admit date: 01/12/2020 Discharge date: 01/14/2020  Time spent: 35 minutes  Recommendations for Outpatient Follow-up:  1. Patient may require outpatient oxygen and will need follow-up in the outpatient setting for the same 2. Discharged on steroid burst will need pulmonology follow-up for PFTs in addition to possible testing for vocal cord dysfunction 3. Will require outpatient psychiatrist-at this admission started on higher dose of hydroxyzine 4. Consider testing for sleep apnea in the outpatient setting  Discharge Diagnoses:  Principal Problem:   Asthma exacerbation Active Problems:   Essential hypertension   Discharge Condition: Improved  Diet recommendation: Heart healthy  Filed Weights   01/12/20 1910  Weight: 81.6 kg    History of present illness:  29 year old black female with underlying asthma Moderate to severe anxiety Borderline abnormal TSH in the past Microcytic anemia probably secondary to periods Chronic abdominal pain with frequent ED visits in the past Prior tobacco abuse Admitted earlier this morning by my partner Dr. Toniann Fail who she followed up with for wheezing was given antibiotics etc. She was afebrile hypoxic and wheezing Covid was negative chest x-ray normal  Hospital Course:  1. Acute asthma exacerbation i. No PFT diagnosis therefore patient will need formal testing in the outpatient setting a. Initially placed on Solu-Medrol on discharge transitioned to prior dosing of prednisone b. Can continue albuterol every 2 as needed 2.5 DuoNebs 3 mill every 6 and Dulera 2 puffs twice daily in addition to guaifenesin c. She is much improved on discharge 2. Severe anxiety a. Went into the room and she was huffing and puffing and was unable to keep her eyes open however was also able to verbalize in full sentences and pull up in the  chair b. She is on hydroxyzine 10 mg 3 times a day which I will increase to 25 twice daily on discharge 3. Hypertension a. Continue amlodipine 5 mg  4. microcytic anemia in the past a. Trends are good no further work-up 5. BMI >34 6. Chronic abdominal pain   Consultations:  None  Discharge Exam: Vitals:   01/14/20 0810 01/14/20 0938  BP:  (!) 147/99  Pulse:  100  Resp:  16  Temp:  98.5 F (36.9 C)  SpO2: 95% 93%    General: Awake coherent pleasant no distress EOMI NCAT no focal deficit no pallor no icterus Cardiovascular: S1-S2 no murmur rub or gallop Respiratory: No rales wheezes rhonchi or other issues Abdomen soft nontender no rebound no guarding Neurologically intact no focal deficit   Discharge Instructions    Allergies as of 01/14/2020      Reactions   Doxycycline Rash      Medication List    STOP taking these medications   amoxicillin 500 MG tablet Commonly known as: AMOXIL   cetirizine 10 MG tablet Commonly known as: ZYRTEC   chlorpheniramine-HYDROcodone 10-8 MG/5ML Suer Commonly known as: TUSSIONEX   doxycycline 100 MG tablet Commonly known as: VIBRA-TABS   promethazine 25 MG tablet Commonly known as: PHENERGAN     TAKE these medications   albuterol 108 (90 Base) MCG/ACT inhaler Commonly known as: VENTOLIN HFA Inhale 1-2 puffs into the lungs every 6 (six) hours as needed for wheezing or shortness of breath.   amLODipine 5 MG tablet Commonly known as: NORVASC Take 1 tablet (5 mg total) by mouth daily.   aspirin-acetaminophen-caffeine 250-250-65 MG tablet Commonly known as: EXCEDRIN MIGRAINE Take 2  tablets by mouth every 6 (six) hours as needed for headache.   budesonide-formoterol 80-4.5 MCG/ACT inhaler Commonly known as: Symbicort Inhale 2 puffs into the lungs daily.   fluticasone 50 MCG/ACT nasal spray Commonly known as: FLONASE Place 2 sprays into both nostrils daily.   guaiFENesin 100 MG/5ML Soln Commonly known as:  ROBITUSSIN Take 5 mLs (100 mg total) by mouth every 4 (four) hours as needed for cough or to loosen phlegm.   hydrOXYzine 25 MG tablet Commonly known as: ATARAX/VISTARIL Take 1 tablet (25 mg total) by mouth 2 (two) times daily as needed for anxiety or itching. What changed:   medication strength  how much to take  when to take this   meloxicam 7.5 MG tablet Commonly known as: Mobic Take 1-2 tablets (7.5-15 mg total) by mouth daily.   mometasone-formoterol 200-5 MCG/ACT Aero Commonly known as: DULERA Inhale 2 puffs into the lungs 2 (two) times daily.   montelukast 10 MG tablet Commonly known as: Singulair Take 1 tablet (10 mg total) by mouth at bedtime.   predniSONE 20 MG tablet Commonly known as: DELTASONE Take 2 tablets (40 mg total) by mouth daily for 3 days.      Allergies  Allergen Reactions  . Doxycycline Rash      The results of significant diagnostics from this hospitalization (including imaging, microbiology, ancillary and laboratory) are listed below for reference.    Significant Diagnostic Studies: DG Chest Port 1 View  Result Date: 01/12/2020 CLINICAL DATA:  Shortness of breath EXAM: PORTABLE CHEST 1 VIEW COMPARISON:  None. FINDINGS: The heart size and mediastinal contours are within normal limits. Both lungs are clear. The visualized skeletal structures are unremarkable. IMPRESSION: No active disease. Electronically Signed   By: Jonna Clark M.D.   On: 01/12/2020 19:45    Microbiology: Recent Results (from the past 240 hour(s))  SARS Coronavirus 2 by RT PCR (hospital order, performed in [facility] hospital lab) Nasopharyngeal Nasopharyngeal Swab     Status: None   Collection Time: 01/12/20  8:09 PM   Specimen: Nasopharyngeal Swab  Result Value Ref Range Status   SARS Coronavirus 2 NEGATIVE NEGATIVE Final    Comment: (NOTE) SARS-CoV-2 target nucleic acids are NOT DETECTED.  The SARS-CoV-2 RNA is generally detectable in upper and lower respiratory  specimens during the acute phase of infection. The lowest concentration of SARS-CoV-2 viral copies this assay can detect is 250 copies / mL. A negative result does not preclude SARS-CoV-2 infection and should not be used as the sole basis for treatment or other patient management decisions.  A negative result may occur with improper specimen collection / handling, submission of specimen other than nasopharyngeal swab, presence of viral mutation(s) within the areas targeted by this assay, and inadequate number of viral copies (<250 copies / mL). A negative result must be combined with clinical observations, patient history, and epidemiological information.  Fact Sheet for Patients:   BoilerBrush.com.cy  Fact Sheet for Healthcare Providers: https://pope.com/  This test is not yet approved or  cleared by the [place] FDA and has been authorized for detection and/or diagnosis of SARS-CoV-2 by FDA under an Emergency Use Authorization (EUA).  This EUA will remain in effect (meaning this test can be used) for the duration of the COVID-19 declaration under Section 564(b)(1) of the Act, 21 U.S.C. section 360bbb-3(b)(1), unless the authorization is terminated or revoked sooner.  Performed at [facility], [address] [place], [place] [address]  Labs: Basic Metabolic Panel: Recent Labs  Lab 01/12/20 1930 01/13/20 0501  NA 137 136  K 3.8 3.9  CL 103 104  CO2 25 15*  GLUCOSE 96 206*  BUN 10 8  CREATININE 0.90 0.90  CALCIUM 8.9 9.1   Liver Function Tests: No results for input(s): AST, ALT, ALKPHOS, BILITOT, PROT, ALBUMIN in the last 168 hours. No results for input(s): LIPASE, AMYLASE in the last 168 hours. No results for input(s): AMMONIA in the last 168 hours. CBC: Recent Labs  Lab 01/12/20 1930 01/13/20 0501  WBC 7.2 7.5  NEUTROABS 3.9  --   HGB 12.6 12.7  HCT 39.1 39.8  MCV 80.1 80.1  PLT  408* 426*   Cardiac Enzymes: No results for input(s): CKTOTAL, CKMB, CKMBINDEX, TROPONINI in the last 168 hours. BNP: BNP (last 3 results) No results for input(s): BNP in the last 8760 hours.  ProBNP (last 3 results) No results for input(s): PROBNP in the last 8760 hours.  CBG: No results for input(s): GLUCAP in the last 168 hours.     Signed:  Rhetta Mura MD   [facility] 01/14/2020, 9:50 AM

## 2020-01-14 NOTE — Progress Notes (Signed)
SATURATION QUALIFICATIONS: (This note is used to comply with regulatory documentation for home oxygen)  Patient Saturations on Room Air at Rest = 93%  Patient Saturations on Room Air while Ambulating = 92%  Patient Saturations on n/a Liters of oxygen while Ambulating = n/a  Please briefly explain why patient needs home oxygen: patient does not require home oxygen

## 2020-02-04 ENCOUNTER — Encounter: Payer: Self-pay | Admitting: Internal Medicine

## 2020-02-04 ENCOUNTER — Ambulatory Visit (INDEPENDENT_AMBULATORY_CARE_PROVIDER_SITE_OTHER): Payer: PRIVATE HEALTH INSURANCE | Admitting: Internal Medicine

## 2020-02-04 ENCOUNTER — Other Ambulatory Visit: Payer: Self-pay

## 2020-02-04 VITALS — BP 138/84 | HR 74 | Temp 97.2°F | Ht 59.0 in | Wt 187.2 lb

## 2020-02-04 DIAGNOSIS — J45909 Unspecified asthma, uncomplicated: Secondary | ICD-10-CM

## 2020-02-04 DIAGNOSIS — G479 Sleep disorder, unspecified: Secondary | ICD-10-CM | POA: Insufficient documentation

## 2020-02-04 DIAGNOSIS — J4541 Moderate persistent asthma with (acute) exacerbation: Secondary | ICD-10-CM | POA: Diagnosis not present

## 2020-02-04 DIAGNOSIS — J4540 Moderate persistent asthma, uncomplicated: Secondary | ICD-10-CM | POA: Diagnosis not present

## 2020-02-04 DIAGNOSIS — G4734 Idiopathic sleep related nonobstructive alveolar hypoventilation: Secondary | ICD-10-CM

## 2020-02-04 LAB — CBC WITH DIFFERENTIAL/PLATELET
Basophils Absolute: 0.1 10*3/uL (ref 0.0–0.1)
Basophils Relative: 1.4 % (ref 0.0–3.0)
Eosinophils Absolute: 0.5 10*3/uL (ref 0.0–0.7)
Eosinophils Relative: 8.5 % — ABNORMAL HIGH (ref 0.0–5.0)
HCT: 35.7 % — ABNORMAL LOW (ref 36.0–46.0)
Hemoglobin: 11.6 g/dL — ABNORMAL LOW (ref 12.0–15.0)
Lymphocytes Relative: 57.4 % — ABNORMAL HIGH (ref 12.0–46.0)
Lymphs Abs: 3.3 10*3/uL (ref 0.7–4.0)
MCHC: 32.5 g/dL (ref 30.0–36.0)
MCV: 78.6 fl (ref 78.0–100.0)
Monocytes Absolute: 0.5 10*3/uL (ref 0.1–1.0)
Monocytes Relative: 8.2 % (ref 3.0–12.0)
Neutro Abs: 1.4 10*3/uL (ref 1.4–7.7)
Neutrophils Relative %: 24.5 % — ABNORMAL LOW (ref 43.0–77.0)
Platelets: 378 10*3/uL (ref 150.0–400.0)
RBC: 4.54 Mil/uL (ref 3.87–5.11)
RDW: 15 % (ref 11.5–15.5)
WBC: 5.7 10*3/uL (ref 4.0–10.5)

## 2020-02-04 NOTE — Assessment & Plan Note (Signed)
Discussed shift-work sleep issue. Also explore possibility of OSA with concern for nocturnal hypoxemia noted at recent hosp. Plan- schedule attended sleep study to add O2 if need documented.

## 2020-02-04 NOTE — Progress Notes (Signed)
02/04/20- 29 yo  F former smoker for sleep evaluation with concern of OSA, asthma/ VCD, referred by Zoe Lan, NP for hosp f/u.Marland Kitchen Medical problem list includes HTN, Asthma, Acute Resp Failure with Hypoxia, Allergic Rhinitis, Urticaria, Abdominal Pain,    Denies pregnant. Hosp 7/18-7/20/21- Asthma, Anxiety, noted to by wheezing and hypoxic, CXR wnl, Covid neg, with recommended f/u to include PFT, sleep study. Not discharged on O2. Anxiety component addressed with increased dose of hydroxyzine.  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 Triggers have included pollen, dust, but not animals or aspirin. Thinks Elwin Sleight is helping. Finished prednisone 1 week ago. Past hx pneumonia. Not needing daily rescue inhaler. No hx ENT surgery or significant sinus disease. Sleep- work shift is 3:30 PM to Midnight when on full schedule. Bedtime 2:30 AM, latency 2-3 hours, up around 2:00 PM. Dark bedroom. Aware of loud snoring. Has tried melatonin and several otc sleep aids, which help a little. Little caffeine. Now on hydroxyzine 25 mg tid.  Prior to Admission medications   Medication Sig Start Date End Date Taking? Authorizing Provider  albuterol (PROVENTIL HFA;VENTOLIN HFA) 108 (90 Base) MCG/ACT inhaler Inhale 1-2 puffs into the lungs every 6 (six) hours as needed for wheezing or shortness of breath. 05/08/18  Yes Mliss Sax, MD  amLODipine (NORVASC) 5 MG tablet Take 1 tablet (5 mg total) by mouth daily. 02/24/19  Yes Horton, Mayer Masker, MD  aspirin-acetaminophen-caffeine (EXCEDRIN MIGRAINE) [phone] MG tablet Take 2 tablets by mouth every 6 (six) hours as needed for headache.   Yes [provider]   fluticasone (FLONASE) 50 MCG/ACT nasal spray Place 2 sprays into both nostrils daily. 09/18/17  Yes Mliss Sax, MD  guaiFENesin (ROBITUSSIN) 100 MG/5ML SOLN Take 5 mLs (100 mg total) by mouth every 4 (four) hours as needed for cough or to loosen phlegm. 01/14/20  Yes Rhetta Mura, MD  hydrOXYzine (ATARAX/VISTARIL) 25 MG tablet Take 1 tablet (25 mg total) by mouth 2 (two) times daily as needed for anxiety or itching. 01/14/20  Yes Rhetta Mura, MD  meloxicam (MOBIC) 7.5 MG tablet Take 1-2 tablets (7.5-15 mg total) by mouth daily. 12/17/17  Yes Cristina Gong, PA-C  mometasone-formoterol (DULERA) 200-5 MCG/ACT AERO Inhale 2 puffs into the lungs 2 (two) times daily. 01/14/20  Yes Rhetta Mura, MD  montelukast (SINGULAIR) 10 MG tablet Take 1 tablet (10 mg total) by mouth at bedtime. 01/14/20  Yes Rhetta Mura, MD   Past Medical History:  Diagnosis Date  . Asthma   . Gastritis   . Gastritis   . Hypertension   . Ovarian cyst   . UTI (lower urinary tract infection)    Past Surgical History:  Procedure Laterality Date  . NO PAST SURGERIES     Family History  Problem Relation Age of Onset  . Arthritis Other   . Cancer Other        breast cancer  . Hypertension Other   . Asthma Other   . Hyperlipidemia Other   . Hypertension Other   . Diabetes Other   . Arthritis Mother   . Hearing loss Mother   . Hypertension Mother   . Arthritis Father   . Hypertension Father   .  Early death Brother   . Arthritis Maternal Grandmother   . Asthma Maternal Grandmother   . Diabetes Maternal Grandmother   . Hearing loss Maternal Grandmother   . Hypertension Maternal Grandmother   . Breast cancer Maternal Grandmother   . Congestive Heart Failure Maternal Grandmother   . Kidney disease Paternal Grandmother   . Stroke Paternal Grandfather   . Hypertension Maternal Grandfather    Social History   Socioeconomic History  . Marital status: Single    Spouse  name: Not on file  . Number of children: Not on file  . Years of education: 101  . Highest education level: Not on file  Occupational History  . Not on file  Tobacco Use  . Smoking status: Former Smoker    Packs/day: 0.10    Types: Cigarettes  . Smokeless tobacco: Never Used  Vaping Use  . Vaping Use: Former  . Substances: Nicotine  Substance and Sexual Activity  . Alcohol use: Yes    Comment: less than once a week  . Drug use: Yes    Types: Marijuana    Comment: Edible  . Sexual activity: Yes    Partners: Female    Birth control/protection: None    Comment: Eventual need for family planning   Other Topics Concern  . Not on file  Social History Narrative  . Not on file   Social Determinants of Health   Financial Resource Strain:   . Difficulty of Paying Living Expenses:   Food Insecurity:   . Worried About Programme researcher, broadcasting/film/video in the Last Year:   . Barista in the Last Year:   Transportation Needs:   . Freight forwarder (Medical):   Marland Kitchen Lack of Transportation (Non-Medical):   Physical Activity:   . Days of Exercise per Week:   . Minutes of Exercise per Session:   Stress:   . Feeling of Stress :   Social Connections:   . Frequency of Communication with Friends and Family:   . Frequency of Social Gatherings with Friends and Family:   . Attends Religious Services:   . Active Member of Clubs or Organizations:   . Attends Banker Meetings:   Marland Kitchen Marital Status:   Intimate Partner Violence:   . Fear of Current or Ex-Partner:   . Emotionally Abused:   Marland Kitchen Physically Abused:   . Sexually Abused:    ROS-see HPI   + = positive Constitutional:    weight loss, night sweats, fevers, chills, fatigue, lassitude. HEENT:    +headaches, difficulty swallowing, tooth/dental problems, +sore throat,       sneezing, itching, ear ache, +nasal congestion, post nasal drip, snoring CV:    +chest pain, orthopnea, PND, swelling in lower extremities, anasarca,                                    dizziness, palpitations Resp:   +shortness of breath with exertion or at rest.                +productive cough,   non-productive cough, coughing up of blood.              +change in color of mucus.  wheezing.   Skin:    rash or lesions. GI:  No-   heartburn, +indigestion, +abdominal pain, nausea, vomiting, diarrhea,  change in bowel habits, +loss of appetite GU: dysuria, change in color of urine, no urgency or frequency.   flank pain. MS:   joint pain, stiffness, decreased range of motion, back pain. Neuro-     nothing unusual Psych:  change in mood or affect.  depression or +anxiety.   memory loss.  OBJ- Physical Exam General- Alert, Oriented, Affect-appropriate, Distress- none acute, +obese Skin- rash-none, lesions- none, excoriation- none Lymphadenopathy- none Head- atraumatic            Eyes- Gross vision intact, PERRLA, conjunctivae and secretions clear            Ears- Hearing, canals-normal            Nose- Clear, no-Septal dev, mucus, polyps, erosion, perforation             Throat- Mallampati II-III , mucosa clear , drainage- none, tonsils- atrophic, + teeth Neck- flexible , trachea midline, no stridor , thyroid nl, carotid no bruit Chest - symmetrical excursion , unlabored           Heart/CV- RRR , no murmur , no gallop  , no rub, nl s1 s2                           - JVD- none , edema- none, stasis changes- none, varices- none           Lung- clear to P&A, wheeze- none, cough+ dry , dullness-none, rub- none           Chest wall-  Abd-  Br/ Gen/ Rectal- Not done, not indicated Extrem- cyanosis- none, clubbing, none, atrophy- none, strength- nl Neuro- grossly intact to observation

## 2020-02-04 NOTE — Assessment & Plan Note (Signed)
She hopes that job trasfer away from [employer], and change to [place], will help. Today feels stable, but only 1 week off steroids and has some dry cough.  Plan- Try samples of Breztri. Labs for CBC diff and IgE to explore potential for Biologic therapy. Schedule PFT.

## 2020-02-04 NOTE — Patient Instructions (Signed)
Order- Schedule split study sleep study   Dx OSA, Asthma, nocturnal hypoxemia  Order- schedule PFT  Order- lab- CBC w diff, IgE    Dx asthma moderate persistent  Sample x 2 Breztri inhaler     Inhale 2 puffs, then rinse mouth, twice daily Try this instead of Dulera. When the samples run out, go back to [facility] and see if one works better.  Please call as needed

## 2020-02-05 LAB — IGE: IgE (Immunoglobulin E), Serum: 66 kU/L (ref <=114)

## 2020-02-27 ENCOUNTER — Encounter (HOSPITAL_BASED_OUTPATIENT_CLINIC_OR_DEPARTMENT_OTHER): Payer: PRIVATE HEALTH INSURANCE | Admitting: Internal Medicine

## 2020-05-10 NOTE — Progress Notes (Deleted)
02/04/20- 29 yo  F former smoker for sleep evaluation with concern of OSA, asthma/ VCD, referred by Zoe Lan, NP for hosp f/u.Marland Kitchen Medical problem list includes HTN, Asthma, Acute Resp Failure with Hypoxia, Allergic Rhinitis, Urticaria, Abdominal Pain,    Denies pregnant. Hosp 7/18-7/20/21- Asthma, Anxiety, noted to by wheezing and hypoxic, CXR wnl, Covid neg, with recommended f/u to include PFT, sleep study. Not discharged on O2. Anxiety component addressed with increased dose of hydroxyzine.  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 Triggers have included pollen, dust, but not animals or aspirin. Thinks Elwin Sleight is helping. Finished prednisone 1 week ago. Past hx pneumonia. Not needing daily rescue inhaler. No hx ENT surgery or significant sinus disease. Sleep- work shift is 3:30 PM to Midnight when on full schedule. Bedtime 2:30 AM, latency 2-3 hours, up around 2:00 PM. Dark bedroom. Aware of loud snoring. Has tried melatonin and several otc sleep aids, which help a little. Little caffeine. Now on hydroxyzine 25 mg tid.  05/11/20- 29 yoF former smoker followed for OSA, complicated by Asthma/ VCD, HTN, Asthma, Acute Resp Failure with Hypoxia, Allergic Rhinitis, Urticaria, Abdominal Pain, Anxiety,  Sleep study scheduled for August, never done [facility] 7/18-7/20- Asthma exacerbation and anxiety Singulair, Albuterol hfa, Duleraq 200, hydroxyzine 25 bid prn anxiety, Covid vax- Flu vax- Body weight today-    CXR 01/12/20- 1V-  IMPRESSION: No active disease.  ROS-see HPI   + = positive Constitutional:    weight loss, night sweats, fevers, chills, fatigue, lassitude. HEENT:    +headaches, difficulty  swallowing, tooth/dental problems, +sore throat,       sneezing, itching, ear ache, +nasal congestion, post nasal drip, snoring CV:    +chest pain, orthopnea, PND, swelling in lower extremities, anasarca,                                   dizziness, palpitations Resp:   +shortness of breath with exertion or at rest.                +productive cough,   non-productive cough, coughing up of blood.              +change in color of mucus.  wheezing.   Skin:    rash or lesions. GI:  No-   heartburn, +indigestion, +abdominal pain, nausea, vomiting, diarrhea,                 change in bowel habits, +loss of appetite GU: dysuria, change in color of urine, no urgency or frequency.   flank pain. MS:   joint pain, stiffness, decreased range of motion, back pain. Neuro-     nothing unusual Psych:  change in mood or affect.  depression or +anxiety.   memory loss.  OBJ- Physical Exam General- Alert, Oriented, Affect-appropriate, Distress- none acute, +obese Skin- rash-none, lesions- none, excoriation- none Lymphadenopathy- none Head- atraumatic            Eyes- Gross vision intact, PERRLA, conjunctivae and secretions clear            Ears- Hearing, canals-normal  Nose- Clear, no-Septal dev, mucus, polyps, erosion, perforation             Throat- Mallampati II-III , mucosa clear , drainage- none, tonsils- atrophic, + teeth Neck- flexible , trachea midline, no stridor , thyroid nl, carotid no bruit Chest - symmetrical excursion , unlabored           Heart/CV- RRR , no murmur , no gallop  , no rub, nl s1 s2                           - JVD- none , edema- none, stasis changes- none, varices- none           Lung- clear to P&A, wheeze- none, cough+ dry , dullness-none, rub- none           Chest wall-  Abd-  Br/ Gen/ Rectal- Not done, not indicated Extrem- cyanosis- none, clubbing, none, atrophy- none, strength- nl Neuro- grossly intact to observation

## 2020-05-11 ENCOUNTER — Ambulatory Visit: Payer: PRIVATE HEALTH INSURANCE | Admitting: Internal Medicine

## 2020-05-20 ENCOUNTER — Emergency Department (HOSPITAL_COMMUNITY)
Admission: EM | Admit: 2020-05-20 | Discharge: 2020-05-20 | Disposition: A | Discharge status: Home / Self Care | Payer: Self-pay | Attending: Emergency Medicine | Admitting: Emergency Medicine

## 2020-05-20 ENCOUNTER — Emergency Department (HOSPITAL_COMMUNITY): Payer: Self-pay

## 2020-05-20 ENCOUNTER — Other Ambulatory Visit: Payer: Self-pay

## 2020-05-20 DIAGNOSIS — Z87891 Personal history of nicotine dependence: Secondary | ICD-10-CM | POA: Insufficient documentation

## 2020-05-20 DIAGNOSIS — R609 Edema, unspecified: Secondary | ICD-10-CM

## 2020-05-20 DIAGNOSIS — M79601 Pain in right arm: Secondary | ICD-10-CM

## 2020-05-20 DIAGNOSIS — R0789 Other chest pain: Secondary | ICD-10-CM

## 2020-05-20 DIAGNOSIS — M79609 Pain in unspecified limb: Secondary | ICD-10-CM

## 2020-05-20 DIAGNOSIS — J45901 Unspecified asthma with (acute) exacerbation: Secondary | ICD-10-CM | POA: Insufficient documentation

## 2020-05-20 DIAGNOSIS — I10 Essential (primary) hypertension: Secondary | ICD-10-CM | POA: Insufficient documentation

## 2020-05-20 DIAGNOSIS — Z7982 Long term (current) use of aspirin: Secondary | ICD-10-CM | POA: Insufficient documentation

## 2020-05-20 DIAGNOSIS — Z79899 Other long term (current) drug therapy: Secondary | ICD-10-CM | POA: Insufficient documentation

## 2020-05-20 DIAGNOSIS — R519 Headache, unspecified: Secondary | ICD-10-CM | POA: Insufficient documentation

## 2020-05-20 LAB — CBC
HCT: 39.2 % (ref 36.0–46.0)
Hemoglobin: 12.2 g/dL (ref 12.0–15.0)
MCH: 24.3 pg — ABNORMAL LOW (ref 26.0–34.0)
MCHC: 31.1 g/dL (ref 30.0–36.0)
MCV: 78.1 fL — ABNORMAL LOW (ref 80.0–100.0)
Platelets: 457 10*3/uL — ABNORMAL HIGH (ref 150–400)
RBC: 5.02 MIL/uL (ref 3.87–5.11)
RDW: 13.9 % (ref 11.5–15.5)
WBC: 4 10*3/uL (ref 4.0–10.5)
nRBC: 0 % (ref 0.0–0.2)

## 2020-05-20 LAB — BASIC METABOLIC PANEL
Anion gap: 10 (ref 5–15)
BUN: 8 mg/dL (ref 6–20)
CO2: 23 mmol/L (ref 22–32)
Calcium: 8.9 mg/dL (ref 8.9–10.3)
Chloride: 104 mmol/L (ref 98–111)
Creatinine, Ser: 0.8 mg/dL (ref 0.44–1.00)
GFR, Estimated: >60 mL/min (ref >60)
Glucose, Bld: 97 mg/dL (ref 70–99)
Potassium: 3.6 mmol/L (ref 3.5–5.1)
Sodium: 137 mmol/L (ref 135–145)

## 2020-05-20 LAB — TROPONIN I (HIGH SENSITIVITY)
Troponin I (High Sensitivity): <2 ng/L (ref <18)
Troponin I (High Sensitivity): <2 ng/L (ref <18)

## 2020-05-20 NOTE — ED Triage Notes (Signed)
Patient c/o headache that's been consistent since last week. C/O numbness of right arm since last night at 2am. Patient also c/o right sided chest pain while at the doctor's office, unrelieved by NTG. When asked about pain level, patient stated "I am okay, other than my right hand, I can't bend and it hurts and its swollen "

## 2020-05-20 NOTE — Progress Notes (Signed)
Right upper extremity venous duplex completed. Refer to "CV Proc" under chart review to view preliminary results.  05/20/2020 6:43 PM Eula Fried., MHA, RVT, RDCS, RDMS

## 2020-05-20 NOTE — ED Provider Notes (Signed)
[facility] EMERGENCY DEPARTMENT Provider Note   CSN: [id number] Arrival date & time: 05/20/20  1518     History Chief Complaint  Patient presents with  . Chest Pain  . Numbness  . Headache    Andrea Daniels is a 29 y.o. female.  Patient is a 29 year old female with past medical history of asthma, hypertension, anxiety.  She presents today for evaluation of multiple complaints.  Patient reports headache for the past week.  She describes a pressure in the back of her head that is unrelieved with over-the-counter medications.  She denies any visual disturbances, fevers, chills, or stiff neck.  She woke up yesterday morning at 2 AM complaining of swelling and pain in her right hand.  She states the pain and swelling has remained and is worse when she attempts to close her hand.  When she attempts to close her hand, the pain shoots into the back of her neck and back of her head.  She was at her primary doctor's office being evaluated for this when she began to experience chest discomfort she describes as a pressure in her chest.  She was given nitroglycerin there, then sent here for further evaluation of these symptoms.  Patient has no prior cardiac history and no cardiac risk factors.  She has had no recent exertional symptoms.  She also tells me that her blood pressure has been elevated with diastolic pressures in the triple digits.  The history is provided by the patient.       Past Medical History:  Diagnosis Date  . Asthma   . Gastritis   . Gastritis   . Hypertension   . Ovarian cyst   . UTI (lower urinary tract infection)     Patient Active Problem List   Diagnosis Date Noted  . Sleep disorder 02/04/2020  . Essential hypertension 01/13/2020  . Asthma exacerbation 01/12/2020  . Cyst of left ovary 12/17/2017  . Acute respiratory failure (HCC) 10/12/2016  . Acute respiratory failure with hypoxia (HCC) 10/11/2016  . Hypokalemia 10/11/2016  . Lobar  pneumonia (HCC)   . CAP (community acquired pneumonia) 06/03/2016  . Epigastric abdominal pain   . Tachycardia 06/02/2016  . Leukocytosis 06/02/2016  . Community acquired pneumonia of right lung 06/02/2016  . Asthma, moderate 06/02/2016  . Hypoxia   . Hypophosphatemia 03/20/2015  . Low TSH level 03/20/2015  . Metabolic acidosis 03/20/2015  . Microcytic anemia 03/20/2015  . Exacerbation of asthma 03/19/2015  . Elevated glucose 07/03/2014  . Asthma with exacerbation 10/22/2013  . Hives 07/11/2013  . Status asthmaticus 06/08/2013  . Nausea vomiting and diarrhea 08/16/2012  . Allergic rhinitis 04/24/2012  . Preventative health care 08/14/2011    Past Surgical History:  Procedure Laterality Date  . NO PAST SURGERIES       OB History    Gravida  0   Para      Term      Preterm      AB      Living        SAB      TAB      Ectopic      Multiple      Live Births              Family History  Problem Relation Age of Onset  . Arthritis Other   . Cancer Other        breast cancer  . Hypertension Other   . Asthma Other   .  Hyperlipidemia Other   . Hypertension Other   . Diabetes Other   . Arthritis Mother   . Hearing loss Mother   . Hypertension Mother   . Arthritis Father   . Hypertension Father   . Early death Brother   . Arthritis Maternal Grandmother   . Asthma Maternal Grandmother   . Diabetes Maternal Grandmother   . Hearing loss Maternal Grandmother   . Hypertension Maternal Grandmother   . Breast cancer Maternal Grandmother   . Congestive Heart Failure Maternal Grandmother   . Kidney disease Paternal Grandmother   . Stroke Paternal Grandfather   . Hypertension Maternal Grandfather     Social History   Tobacco Use  . Smoking status: Former Smoker    Packs/day: 0.10    Types: Cigarettes  . Smokeless tobacco: Never Used  Vaping Use  . Vaping Use: Former  . Substances: Nicotine  Substance Use Topics  . Alcohol use: Yes    Comment:  less than once a week  . Drug use: Yes    Types: Marijuana    Comment: Edible    Home Medications Prior to Admission medications   Medication Sig Start Date End Date Taking? Authorizing Provider  albuterol (PROVENTIL HFA;VENTOLIN HFA) 108 (90 Base) MCG/ACT inhaler Inhale 1-2 puffs into the lungs every 6 (six) hours as needed for wheezing or shortness of breath. 05/08/18   Mliss Sax, MD  amLODipine (NORVASC) 5 MG tablet Take 1 tablet (5 mg total) by mouth daily. 02/24/19   Horton, Mayer Masker, MD  aspirin-acetaminophen-caffeine (EXCEDRIN MIGRAINE) [phone] MG tablet Take 2 tablets by mouth every 6 (six) hours as needed for headache.    [provider]  fluticasone (FLONASE) 50 MCG/ACT nasal spray Place 2 sprays into both nostrils daily. 09/18/17   Mliss Sax, MD  guaiFENesin (ROBITUSSIN) 100 MG/5ML SOLN Take 5 mLs (100 mg total) by mouth every 4 (four) hours as needed for cough or to loosen phlegm. 01/14/20   Rhetta Mura, MD  hydrOXYzine (ATARAX/VISTARIL) 25 MG tablet Take 1 tablet (25 mg total) by mouth 2 (two) times daily as needed for anxiety or itching. 01/14/20   Rhetta Mura, MD  meloxicam (MOBIC) 7.5 MG tablet Take 1-2 tablets (7.5-15 mg total) by mouth daily. 12/17/17   Cristina Gong, PA-C  mometasone-formoterol (DULERA) 200-5 MCG/ACT AERO Inhale 2 puffs into the lungs 2 (two) times daily. 01/14/20   Rhetta Mura, MD  montelukast (SINGULAIR) 10 MG tablet Take 1 tablet (10 mg total) by mouth at bedtime. 01/14/20   Rhetta Mura, MD    Allergies    Doxycycline  Review of Systems   Review of Systems  All other systems reviewed and are negative.   Physical Exam Updated Vital Signs BP (!) 161/106   Pulse 74   Temp 98.1 F (36.7 C) (Oral)   Resp 13   SpO2 100%   Physical Exam Vitals and nursing note reviewed.  Constitutional:      General: She is not in acute distress.    Appearance: She is well-developed.  She is not diaphoretic.  HENT:     Head: Normocephalic and atraumatic.  Cardiovascular:     Rate and Rhythm: Normal rate and regular rhythm.     Heart sounds: No murmur heard.  No friction rub. No gallop.   Pulmonary:     Effort: Pulmonary effort is normal. No respiratory distress.     Breath sounds: Normal breath sounds. No wheezing.  Abdominal:  General: Bowel sounds are normal. There is no distension.     Palpations: Abdomen is soft.     Tenderness: There is no abdominal tenderness.  Musculoskeletal:        General: Normal range of motion.     Cervical back: Normal range of motion and neck supple.     Right lower leg: No tenderness. No edema.     Left lower leg: No tenderness. No edema.     Comments: There is perhaps mild swelling to the area of the thenar eminence.  There is no erythema or warmth.  She reports pain with active range of motion, but I am able to close her fist passively.  Ulnar and radial pulses are easily palpable and motor and sensation are intact throughout the entire hand.  Skin:    General: Skin is warm and dry.  Neurological:     General: No focal deficit present.     Mental Status: She is alert and oriented to person, place, and time.     Cranial Nerves: No cranial nerve deficit.     Motor: No weakness.     ED Results / Procedures / Treatments   Labs (all labs ordered are listed, but only abnormal results are displayed) Labs Reviewed  CBC - Abnormal; Notable for the following components:      Result Value   MCV 78.1 (*)    MCH 24.3 (*)    Platelets 457 (*)    All other components within normal limits  BASIC METABOLIC PANEL  TROPONIN I (HIGH SENSITIVITY)    EKG EKG Interpretation  Date/Time:  Wednesday May 20 2020 15:28:24 EST Ventricular Rate:  78 PR Interval:  150 QRS Duration: 90 QT Interval:  378 QTC Calculation: 430 R Axis:   48 Text Interpretation: Normal sinus rhythm Cannot rule out Anterior infarct , age undetermined  Abnormal ECG Confirmed by Geoffery Lyons ([id number]) on 05/20/2020 5:00:08 PM   Radiology DG Chest 2 View  Result Date: 05/20/2020 CLINICAL DATA:  Chest pain. EXAM: CHEST - 2 VIEW COMPARISON:  01/12/2020. FINDINGS: The heart size and mediastinal contours are within normal limits. Both lungs are clear. No visible pleural effusions or pneumothorax. The visualized skeletal structures are unremarkable. IMPRESSION: No active cardiopulmonary disease. Electronically Signed   By: Feliberto Harts MD   On: 05/20/2020 15:58    Procedures Procedures (including critical care time)  Medications Ordered in ED Medications - No data to display  ED Course  I have reviewed the triage vital signs and the nursing notes.  Pertinent labs & imaging results that were available during my care of the patient were reviewed by me and considered in my medical decision making (see chart for details).    MDM Rules/Calculators/A&P  Patient's blood pressures have improved and laboratory studies are unremarkable.  I am uncertain as to the etiology of her headache, chest discomfort, and arm pain, but there is no evidence for DVT, cardiac etiology, or acute intracranial pathology.  Patient seems stable for discharge with ibuprofen as needed and follow-up.  Final Clinical Impression(s) / ED Diagnoses Final diagnoses:  None    Rx / DC Orders ED Discharge Orders    None       Geoffery Lyons, MD 05/20/20 2036

## 2020-05-20 NOTE — ED Notes (Signed)
Pt taken to vascular

## 2020-05-20 NOTE — Discharge Instructions (Addendum)
Take ibuprofen 600 mg every 6 hours as needed for pain.  Follow-up with your primary doctor if symptoms or not improving through the weekend, and return to the ER if symptoms significantly worsen or change.

## 2020-07-04 ENCOUNTER — Encounter (HOSPITAL_COMMUNITY): Payer: Self-pay | Admitting: *Deleted

## 2020-07-04 ENCOUNTER — Other Ambulatory Visit: Payer: Self-pay

## 2020-07-04 ENCOUNTER — Emergency Department (HOSPITAL_COMMUNITY)
Admission: EM | Admit: 2020-07-04 | Discharge: 2020-07-04 | Disposition: A | Discharge status: Home / Self Care | Payer: BLUE CROSS/BLUE SHIELD | Attending: Emergency Medicine | Admitting: Emergency Medicine

## 2020-07-04 DIAGNOSIS — R1013 Epigastric pain: Secondary | ICD-10-CM | POA: Insufficient documentation

## 2020-07-04 DIAGNOSIS — Z79899 Other long term (current) drug therapy: Secondary | ICD-10-CM | POA: Diagnosis not present

## 2020-07-04 DIAGNOSIS — J4541 Moderate persistent asthma with (acute) exacerbation: Secondary | ICD-10-CM | POA: Diagnosis not present

## 2020-07-04 DIAGNOSIS — I10 Essential (primary) hypertension: Secondary | ICD-10-CM | POA: Insufficient documentation

## 2020-07-04 DIAGNOSIS — U071 COVID-19: Secondary | ICD-10-CM | POA: Insufficient documentation

## 2020-07-04 DIAGNOSIS — Z7982 Long term (current) use of aspirin: Secondary | ICD-10-CM | POA: Insufficient documentation

## 2020-07-04 DIAGNOSIS — Z87891 Personal history of nicotine dependence: Secondary | ICD-10-CM | POA: Diagnosis not present

## 2020-07-04 DIAGNOSIS — Z20822 Contact with and (suspected) exposure to covid-19: Secondary | ICD-10-CM

## 2020-07-04 DIAGNOSIS — R509 Fever, unspecified: Secondary | ICD-10-CM | POA: Diagnosis present

## 2020-07-04 LAB — SARS CORONAVIRUS 2 (TAT 6-24 HRS): SARS Coronavirus 2: POSITIVE — AB

## 2020-07-04 MED ORDER — ONDANSETRON 4 MG PO TBDP: 4.0000 mg | ORAL_TABLET | Freq: Three times a day (TID) | ORAL | 0 refills | Status: AC | PRN

## 2020-07-04 MED ORDER — ALBUTEROL SULFATE HFA 108 (90 BASE) MCG/ACT IN AERS
1.0000 | INHALATION_SPRAY | Freq: Four times a day (QID) | RESPIRATORY_TRACT | 0 refills | Status: DC | PRN
Start: 2020-07-04 — End: 2023-02-28

## 2020-07-04 NOTE — ED Provider Notes (Signed)
[facility]-EMERGENCY DEPT Provider Note   CSN: [id number] Arrival date & time: 07/04/20  0748     History Chief Complaint  Patient presents with  . Covid Symptoms    Andrea Daniels is a 30 y.o. female past history of asthma, gastritis, hypertension who presents for evaluation of 5 days of generalized body aches, subjective fever/chills, cough, nausea/vomiting/diarrhea.  She reports that wife and daughter have similar symptoms.  Both her and her wife tested negative initially but daughter has tested positive.  She reports that she has had intermittent nausea/vomiting/diarrhea.  She does report that she has been able to tolerate some p.o.  She states that when she vomits, she will sometimes have some epigastric abdominal pain but this does not occur all the time.  She states cough is nonproductive.  She reports some times having trouble breathing when she gets in a coughing fit.  She does have history of asthma.  She does not smoke.  She has not had any sore throat.  She also reports some rhinorrhea, nasal congestion.  The history is provided by the patient.       Past Medical History:  Diagnosis Date  . Asthma   . Gastritis   . Gastritis   . Hypertension   . Ovarian cyst   . UTI (lower urinary tract infection)     Patient Active Problem List   Diagnosis Date Noted  . Sleep disorder 02/04/2020  . Essential hypertension 01/13/2020  . Asthma exacerbation 01/12/2020  . Cyst of left ovary 12/17/2017  . Acute respiratory failure (HCC) 10/12/2016  . Acute respiratory failure with hypoxia (HCC) 10/11/2016  . Hypokalemia 10/11/2016  . Lobar pneumonia (HCC)   . CAP (community acquired pneumonia) 06/03/2016  . Epigastric abdominal pain   . Tachycardia 06/02/2016  . Leukocytosis 06/02/2016  . Community acquired pneumonia of right lung 06/02/2016  . Asthma, moderate 06/02/2016  . Hypoxia   . Hypophosphatemia 03/20/2015  . Low TSH level 03/20/2015  .  Metabolic acidosis 03/20/2015  . Microcytic anemia 03/20/2015  . Exacerbation of asthma 03/19/2015  . Elevated glucose 07/03/2014  . Asthma with exacerbation 10/22/2013  . Hives 07/11/2013  . Status asthmaticus 06/08/2013  . Nausea vomiting and diarrhea 08/16/2012  . Allergic rhinitis 04/24/2012  . Preventative health care 08/14/2011    Past Surgical History:  Procedure Laterality Date  . NO PAST SURGERIES       OB History    Gravida  0   Para      Term      Preterm      AB      Living        SAB      IAB      Ectopic      Multiple      Live Births              Family History  Problem Relation Age of Onset  . Arthritis Other   . Cancer Other        breast cancer  . Hypertension Other   . Asthma Other   . Hyperlipidemia Other   . Hypertension Other   . Diabetes Other   . Arthritis Mother   . Hearing loss Mother   . Hypertension Mother   . Arthritis Father   . Hypertension Father   . Early death Brother   . Arthritis Maternal Grandmother   . Asthma Maternal Grandmother   . Diabetes Maternal Grandmother   .  Hearing loss Maternal Grandmother   . Hypertension Maternal Grandmother   . Breast cancer Maternal Grandmother   . Congestive Heart Failure Maternal Grandmother   . Kidney disease Paternal Grandmother   . Stroke Paternal Grandfather   . Hypertension Maternal Grandfather     Social History   Tobacco Use  . Smoking status: Former Smoker    Packs/day: 0.10    Types: Cigarettes  . Smokeless tobacco: Never Used  Vaping Use  . Vaping Use: Former  . Substances: Nicotine  Substance Use Topics  . Alcohol use: Yes    Comment: less than once a week  . Drug use: Yes    Types: Marijuana    Comment: Edible    Home Medications Prior to Admission medications   Medication Sig Start Date End Date Taking? Authorizing Provider  albuterol (VENTOLIN HFA) 108 (90 Base) MCG/ACT inhaler Inhale 1-2 puffs into the lungs every 6 (six) hours as needed  for wheezing or shortness of breath. 07/04/20  Yes Graciella Freer A, PA-C  ondansetron (ZOFRAN ODT) 4 MG disintegrating tablet Take 1 tablet (4 mg total) by mouth every 8 (eight) hours as needed for nausea or vomiting. 07/04/20  Yes Graciella Freer A, PA-C  albuterol (PROVENTIL HFA;VENTOLIN HFA) 108 (90 Base) MCG/ACT inhaler Inhale 1-2 puffs into the lungs every 6 (six) hours as needed for wheezing or shortness of breath. 05/08/18   Mliss Sax, MD  amLODipine (NORVASC) 5 MG tablet Take 1 tablet (5 mg total) by mouth daily. 02/24/19   Horton, Mayer Masker, MD  aspirin-acetaminophen-caffeine (EXCEDRIN MIGRAINE) [phone] MG tablet Take 2 tablets by mouth every 6 (six) hours as needed for headache.    [provider]  fluticasone (FLONASE) 50 MCG/ACT nasal spray Place 2 sprays into both nostrils daily. Patient not taking: Reported on 05/20/2020 09/18/17   Mliss Sax, MD  guaiFENesin (ROBITUSSIN) 100 MG/5ML SOLN Take 5 mLs (100 mg total) by mouth every 4 (four) hours as needed for cough or to loosen phlegm. 01/14/20   Rhetta Mura, MD  hydrOXYzine (ATARAX/VISTARIL) 25 MG tablet Take 1 tablet (25 mg total) by mouth 2 (two) times daily as needed for anxiety or itching. 01/14/20   Rhetta Mura, MD  labetalol (NORMODYNE) 100 MG tablet Take 100 mg by mouth once.  05/20/20   [provider]  meloxicam (MOBIC) 7.5 MG tablet Take 1-2 tablets (7.5-15 mg total) by mouth daily. 12/17/17   Cristina Gong, PA-C  mometasone-formoterol (DULERA) 200-5 MCG/ACT AERO Inhale 2 puffs into the lungs 2 (two) times daily. 01/14/20   Rhetta Mura, MD  montelukast (SINGULAIR) 10 MG tablet Take 1 tablet (10 mg total) by mouth at bedtime. 01/14/20   Rhetta Mura, MD  nitroGLYCERIN (NITROSTAT) 0.4 MG SL tablet Place 0.4 mg under the tongue every 5 (five) minutes as needed for chest pain.  05/20/20   [provider]  OVER THE COUNTER MEDICATION Take 1 packet  by mouth See admin instructions. Pt drinks a supplement once a week on sundays consisting of  Elderberry,zinc,tumeric and black pepper    [provider]  Vitamin D, Cholecalciferol, 25 MCG (1000 UT) TABS Take 1 tablet by mouth once a week. saturday    [provider]    Allergies    Doxycycline  Review of Systems   Review of Systems  Constitutional: Positive for activity change, appetite change and fatigue. Negative for fever.  HENT: Positive for congestion and rhinorrhea. Negative for sore throat and trouble swallowing.  Respiratory: Positive for cough. Negative for shortness of breath.   Cardiovascular: Negative for chest pain.  Gastrointestinal: Positive for diarrhea, nausea and vomiting. Negative for abdominal pain.  Neurological: Negative for headaches.  All other systems reviewed and are negative.   Physical Exam Updated Vital Signs BP (!) 128/92   Pulse 86   Temp 98.6 F (37 C) (Oral)   Resp 18   Ht 4\' 11"  (1.499 m)   Wt 83.9 kg   SpO2 94%   BMI 37.37 kg/m   Physical Exam Vitals and nursing note reviewed.  Constitutional:      Appearance: She is well-developed and well-nourished.  HENT:     Head: Normocephalic and atraumatic.     Mouth/Throat:     Comments: Posterior pharynx is clear.  Uvula is midline, airways patent. Eyes:     General: No scleral icterus.       Right eye: No discharge.        Left eye: No discharge.     Extraocular Movements: EOM normal.     Conjunctiva/sclera: Conjunctivae normal.  Pulmonary:     Effort: Pulmonary effort is normal.     Comments: Lungs clear to auscultation bilaterally.  Symmetric chest rise.  No wheezing, rales, rhonchi. Abdominal:     Comments: Abdomen is soft, non-distended, non-tender. No rigidity, No guarding. No peritoneal signs.  Skin:    General: Skin is warm and dry.  Neurological:     Mental Status: She is alert.  Psychiatric:        Mood and Affect: Mood and affect normal.        Speech:  Speech normal.        Behavior: Behavior normal.     ED Results / Procedures / Treatments   Labs (all labs ordered are listed, but only abnormal results are displayed) Labs Reviewed  SARS CORONAVIRUS 2 (TAT 6-24 HRS)    EKG None  Radiology No results found.  Procedures Procedures (including critical care time)  Medications Ordered in ED Medications - No data to display  ED Course  I have reviewed the triage vital signs and the nursing notes.  Pertinent labs & imaging results that were available during my care of the patient were reviewed by me and considered in my medical decision making (see chart for details).    MDM Rules/Calculators/A&P                          30 year old female who presents for evaluation of 5 days of generalized body aches, subjective fever/chills, nausea/vomiting/diarrhea.  Wife and daughter at home with similar symptoms.  Daughter tested positive for COVID.  Patient has been vaccinated x2.  Did not receive a booster.  On initial arrival, he is afebrile nontoxic-appearing.  Vital signs are stable.  Abdominal exam is benign.  Suspect viral illness such as COVID-19.  COVID-19 test ordered at triage.  At this time, patient is well-appearing.  We will plan to give her albuterol inhaler as well as Zofran for symptomatic control.  She has a COVID-19 test pending.  Encouraged at home supportive care measures. At this time, patient exhibits no emergent life-threatening condition that require further evaluation in ED. Patient had ample opportunity for questions and discussion. All patient's questions were answered with full understanding. Strict return precautions discussed. Patient expresses understanding and agreement to plan.   Andrea Daniels was evaluated in Emergency Department on 07/04/2020 for the symptoms described in the  history of present illness. She was evaluated in the context of the global COVID-19 pandemic, which necessitated consideration that  the patient might be at risk for infection with the SARS-CoV-2 virus that causes COVID-19. Institutional protocols and algorithms that pertain to the evaluation of patients at risk for COVID-19 are in a state of rapid change based on information released by regulatory bodies including the CDC and federal and state organizations. These policies and algorithms were followed during the patient's care in the ED.  Portions of this note were generated with Scientist, clinical (histocompatibility and immunogenetics). Dictation errors may occur despite best attempts at proofreading.   Final Clinical Impression(s) / ED Diagnoses Final diagnoses:  Suspected COVID-19 virus infection    Rx / DC Orders ED Discharge Orders         Ordered    ondansetron (ZOFRAN ODT) 4 MG disintegrating tablet  Every 8 hours PRN        07/04/20 1357    albuterol (VENTOLIN HFA) 108 (90 Base) MCG/ACT inhaler  Every 6 hours PRN        07/04/20 1357           Rosana Hoes 07/04/20 1401    Gerhard Munch, MD 07/04/20 [phone]

## 2020-07-04 NOTE — ED Triage Notes (Addendum)
BIB EMS Family members tested + Covid, pt has fever, cough, shob, body aches 150/100-80-18-99% CBG 104

## 2020-07-04 NOTE — ED Notes (Signed)
Pt discharged from this ED in stable condition at this time. All discharge instructions and follow up care reviewed with pt with no further questions at this time. Pt ambulatory with steady gait, clear speech.  

## 2020-07-04 NOTE — Discharge Instructions (Signed)
You have a COVID test pending. This may take several hours to come back. You can check online or use the MyChart App to look at your results.   You can take Tylenol or Ibuprofen as directed for pain. You can alternate Tylenol and Ibuprofen every 4 hours. If you take Tylenol at 1pm, then you can take Ibuprofen at 5pm. Then you can take Tylenol again at 9pm.   Make sure you are staying hydrated and drinking plenty of fluids.   Use albuterol in haler.  Take zofran for nausea.   Return to the Emergency Dept for any difficulty breathing, vomiting/inability to keep any food or liquid downs, chest pain or any other worsening or concerning symptoms.

## 2020-07-05 ENCOUNTER — Encounter (HOSPITAL_BASED_OUTPATIENT_CLINIC_OR_DEPARTMENT_OTHER): Payer: Self-pay

## 2020-07-05 ENCOUNTER — Emergency Department (HOSPITAL_BASED_OUTPATIENT_CLINIC_OR_DEPARTMENT_OTHER)
Admission: EM | Admit: 2020-07-05 | Discharge: 2020-07-05 | Disposition: A | Discharge status: Home / Self Care | Payer: BLUE CROSS/BLUE SHIELD | Attending: Emergency Medicine | Admitting: Emergency Medicine

## 2020-07-05 ENCOUNTER — Other Ambulatory Visit: Payer: Self-pay

## 2020-07-05 DIAGNOSIS — Z87891 Personal history of nicotine dependence: Secondary | ICD-10-CM | POA: Diagnosis not present

## 2020-07-05 DIAGNOSIS — R197 Diarrhea, unspecified: Secondary | ICD-10-CM | POA: Insufficient documentation

## 2020-07-05 DIAGNOSIS — Z7951 Long term (current) use of inhaled steroids: Secondary | ICD-10-CM | POA: Diagnosis not present

## 2020-07-05 DIAGNOSIS — R112 Nausea with vomiting, unspecified: Secondary | ICD-10-CM | POA: Diagnosis not present

## 2020-07-05 DIAGNOSIS — Z79899 Other long term (current) drug therapy: Secondary | ICD-10-CM | POA: Insufficient documentation

## 2020-07-05 DIAGNOSIS — J45901 Unspecified asthma with (acute) exacerbation: Secondary | ICD-10-CM | POA: Insufficient documentation

## 2020-07-05 DIAGNOSIS — I10 Essential (primary) hypertension: Secondary | ICD-10-CM | POA: Diagnosis not present

## 2020-07-05 LAB — COMPREHENSIVE METABOLIC PANEL
ALT: 41 U/L (ref 0–44)
AST: 39 U/L (ref 15–41)
Albumin: 3.8 g/dL (ref 3.5–5.0)
Alkaline Phosphatase: 59 U/L (ref 38–126)
Anion gap: 11 (ref 5–15)
BUN: 10 mg/dL (ref 6–20)
CO2: 22 mmol/L (ref 22–32)
Calcium: 8.1 mg/dL — ABNORMAL LOW (ref 8.9–10.3)
Chloride: 105 mmol/L (ref 98–111)
Creatinine, Ser: 0.72 mg/dL (ref 0.44–1.00)
GFR, Estimated: >60 mL/min (ref >60)
Glucose, Bld: 94 mg/dL (ref 70–99)
Potassium: 3.4 mmol/L — ABNORMAL LOW (ref 3.5–5.1)
Sodium: 138 mmol/L (ref 135–145)
Total Bilirubin: 0.3 mg/dL (ref 0.3–1.2)
Total Protein: 7.6 g/dL (ref 6.5–8.1)

## 2020-07-05 LAB — CBC WITH DIFFERENTIAL/PLATELET
Abs Immature Granulocytes: 0.01 10*3/uL (ref 0.00–0.07)
Basophils Absolute: 0 10*3/uL (ref 0.0–0.1)
Basophils Relative: 1 %
Eosinophils Absolute: 0 10*3/uL (ref 0.0–0.5)
Eosinophils Relative: 0 %
HCT: 37.7 % (ref 36.0–46.0)
Hemoglobin: 12.2 g/dL (ref 12.0–15.0)
Immature Granulocytes: 0 %
Lymphocytes Relative: 48 %
Lymphs Abs: 1.9 10*3/uL (ref 0.7–4.0)
MCH: 24.6 pg — ABNORMAL LOW (ref 26.0–34.0)
MCHC: 32.4 g/dL (ref 30.0–36.0)
MCV: 76 fL — ABNORMAL LOW (ref 80.0–100.0)
Monocytes Absolute: 0.2 10*3/uL (ref 0.1–1.0)
Monocytes Relative: 6 %
Neutro Abs: 1.7 10*3/uL (ref 1.7–7.7)
Neutrophils Relative %: 45 %
Platelets: 269 10*3/uL (ref 150–400)
RBC: 4.96 MIL/uL (ref 3.87–5.11)
RDW: 14.6 % (ref 11.5–15.5)
WBC: 3.9 10*3/uL — ABNORMAL LOW (ref 4.0–10.5)
nRBC: 0 % (ref 0.0–0.2)

## 2020-07-05 LAB — LIPASE, BLOOD: Lipase: 38 U/L (ref 11–51)

## 2020-07-05 MED ORDER — SODIUM CHLORIDE 0.9 % IV BOLUS
1000.0000 mL | Freq: Once | INTRAVENOUS | Status: AC
  Administered 2020-07-05: 1000 mL via INTRAVENOUS

## 2020-07-05 MED ORDER — PROCHLORPERAZINE EDISYLATE 10 MG/2ML IJ SOLN
10.0000 mg | Freq: Once | INTRAMUSCULAR | Status: AC
  Administered 2020-07-05: 10 mg via INTRAVENOUS
  Filled 2020-07-05: qty 2

## 2020-07-05 MED ORDER — DIPHENHYDRAMINE HCL 50 MG/ML IJ SOLN
25.0000 mg | Freq: Once | INTRAMUSCULAR | Status: AC
  Administered 2020-07-05: 25 mg via INTRAVENOUS
  Filled 2020-07-05: qty 1

## 2020-07-05 NOTE — ED Triage Notes (Signed)
Pt arrives via [facility] from home where pt has been having symptoms of Covid since Monday. Pt was seen at [facility] yesterday and given home Zofran. Pt unable to keep down home Zofran today EMS started IV PTA and gave 4mg  IV Zofran. Pt reports some relief with this.

## 2020-07-05 NOTE — ED Provider Notes (Signed)
[facility] EMERGENCY DEPARTMENT Provider Note   CSN: [id number] Arrival date & time: 07/05/20  1730     History Chief Complaint  Patient presents with  . Emesis    Andrea Daniels is a 30 y.o. female.  The history is provided by the patient.  Emesis Severity:  Mild Timing:  Intermittent Progression:  Unchanged Chronicity:  New Relieved by:  Nothing Worsened by:  Nothing Associated symptoms: diarrhea   Associated symptoms: no abdominal pain, no arthralgias, no chills, no cough, no fever, no headaches, no myalgias, no sore throat and no URI   Risk factors: sick contacts (COVID diagnosis this week)        Past Medical History:  Diagnosis Date  . Asthma   . Gastritis   . Gastritis   . Hypertension   . Ovarian cyst   . UTI (lower urinary tract infection)     Patient Active Problem List   Diagnosis Date Noted  . Sleep disorder 02/04/2020  . Essential hypertension 01/13/2020  . Asthma exacerbation 01/12/2020  . Cyst of left ovary 12/17/2017  . Acute respiratory failure (HCC) 10/12/2016  . Acute respiratory failure with hypoxia (HCC) 10/11/2016  . Hypokalemia 10/11/2016  . Lobar pneumonia (HCC)   . CAP (community acquired pneumonia) 06/03/2016  . Epigastric abdominal pain   . Tachycardia 06/02/2016  . Leukocytosis 06/02/2016  . Community acquired pneumonia of right lung 06/02/2016  . Asthma, moderate 06/02/2016  . Hypoxia   . Hypophosphatemia 03/20/2015  . Low TSH level 03/20/2015  . Metabolic acidosis 03/20/2015  . Microcytic anemia 03/20/2015  . Exacerbation of asthma 03/19/2015  . Elevated glucose 07/03/2014  . Asthma with exacerbation 10/22/2013  . Hives 07/11/2013  . Status asthmaticus 06/08/2013  . Nausea vomiting and diarrhea 08/16/2012  . Allergic rhinitis 04/24/2012  . Preventative health care 08/14/2011    Past Surgical History:  Procedure Laterality Date  . NO PAST SURGERIES       OB History    Gravida  0   Para       Term      Preterm      AB      Living        SAB      IAB      Ectopic      Multiple      Live Births              Family History  Problem Relation Age of Onset  . Arthritis Other   . Cancer Other        breast cancer  . Hypertension Other   . Asthma Other   . Hyperlipidemia Other   . Hypertension Other   . Diabetes Other   . Arthritis Mother   . Hearing loss Mother   . Hypertension Mother   . Arthritis Father   . Hypertension Father   . Early death Brother   . Arthritis Maternal Grandmother   . Asthma Maternal Grandmother   . Diabetes Maternal Grandmother   . Hearing loss Maternal Grandmother   . Hypertension Maternal Grandmother   . Breast cancer Maternal Grandmother   . Congestive Heart Failure Maternal Grandmother   . Kidney disease Paternal Grandmother   . Stroke Paternal Grandfather   . Hypertension Maternal Grandfather     Social History   Tobacco Use  . Smoking status: Former Smoker    Packs/day: 0.10    Types: Cigarettes  . Smokeless tobacco: Never Used  Vaping Use  . Vaping Use: Former  . Substances: Nicotine  Substance Use Topics  . Alcohol use: Yes    Comment: less than once a week  . Drug use: Yes    Types: Marijuana    Comment: Edible    Home Medications Prior to Admission medications   Medication Sig Start Date End Date Taking? Authorizing Provider  albuterol (PROVENTIL HFA;VENTOLIN HFA) 108 (90 Base) MCG/ACT inhaler Inhale 1-2 puffs into the lungs every 6 (six) hours as needed for wheezing or shortness of breath. 05/08/18   Mliss Sax, MD  albuterol (VENTOLIN HFA) 108 (90 Base) MCG/ACT inhaler Inhale 1-2 puffs into the lungs every 6 (six) hours as needed for wheezing or shortness of breath. 07/04/20   Daniels Caul, PA-C  amLODipine (NORVASC) 5 MG tablet Take 1 tablet (5 mg total) by mouth daily. 02/24/19   Horton, Mayer Masker, MD  aspirin-acetaminophen-caffeine (EXCEDRIN MIGRAINE) [phone] MG tablet Take 2  tablets by mouth every 6 (six) hours as needed for headache.    [provider]  fluticasone (FLONASE) 50 MCG/ACT nasal spray Place 2 sprays into both nostrils daily. Patient not taking: Reported on 05/20/2020 09/18/17   Mliss Sax, MD  guaiFENesin (ROBITUSSIN) 100 MG/5ML SOLN Take 5 mLs (100 mg total) by mouth every 4 (four) hours as needed for cough or to loosen phlegm. 01/14/20   Rhetta Mura, MD  hydrOXYzine (ATARAX/VISTARIL) 25 MG tablet Take 1 tablet (25 mg total) by mouth 2 (two) times daily as needed for anxiety or itching. 01/14/20   Rhetta Mura, MD  labetalol (NORMODYNE) 100 MG tablet Take 100 mg by mouth once.  05/20/20   [provider]  meloxicam (MOBIC) 7.5 MG tablet Take 1-2 tablets (7.5-15 mg total) by mouth daily. 12/17/17   Cristina Gong, PA-C  mometasone-formoterol (DULERA) 200-5 MCG/ACT AERO Inhale 2 puffs into the lungs 2 (two) times daily. 01/14/20   Rhetta Mura, MD  montelukast (SINGULAIR) 10 MG tablet Take 1 tablet (10 mg total) by mouth at bedtime. 01/14/20   Rhetta Mura, MD  nitroGLYCERIN (NITROSTAT) 0.4 MG SL tablet Place 0.4 mg under the tongue every 5 (five) minutes as needed for chest pain.  05/20/20   [provider]  ondansetron (ZOFRAN ODT) 4 MG disintegrating tablet Take 1 tablet (4 mg total) by mouth every 8 (eight) hours as needed for nausea or vomiting. 07/04/20   Daniels Caul, PA-C  OVER THE COUNTER MEDICATION Take 1 packet by mouth See admin instructions. Pt drinks a supplement once a week on sundays consisting of  Elderberry,zinc,tumeric and black pepper    [provider]  Vitamin D, Cholecalciferol, 25 MCG (1000 UT) TABS Take 1 tablet by mouth once a week. saturday    [provider]    Allergies    Doxycycline  Review of Systems   Review of Systems  Constitutional: Negative for chills and fever.  HENT: Negative for ear pain and sore throat.   Eyes: Negative  for pain and visual disturbance.  Respiratory: Negative for cough and shortness of breath.   Cardiovascular: Negative for chest pain and palpitations.  Gastrointestinal: Positive for diarrhea, nausea and vomiting. Negative for abdominal pain.  Genitourinary: Negative for dysuria and hematuria.  Musculoskeletal: Negative for arthralgias, back pain and myalgias.  Skin: Negative for color change and rash.  Neurological: Negative for seizures, syncope and headaches.  All other systems reviewed and are negative.   Physical Exam Updated Vital Signs  ED Triage  Vitals  Enc Vitals Group     BP 07/05/20 1740 (!) 151/88     Pulse Rate 07/05/20 1740 72     Resp 07/05/20 1740 (!) 24     Temp 07/05/20 1740 98.9 F (37.2 C)     Temp Source 07/05/20 1740 Oral     SpO2 07/05/20 1730 98 %     Weight 07/05/20 1737 185 lb (83.9 kg)     Height 07/05/20 1737 4\' 11"  (1.499 m)     Head Circumference --      Peak Flow --      Pain Score 07/05/20 1735 6     Pain Loc --      Pain Edu? --      Excl. in GC? --     Physical Exam Vitals and nursing note reviewed.  Constitutional:      General: She is not in acute distress.    Appearance: She is well-developed and well-nourished. She is not ill-appearing.  HENT:     Head: Normocephalic and atraumatic.     Nose: Nose normal.     Mouth/Throat:     Mouth: Mucous membranes are moist.  Eyes:     Extraocular Movements: Extraocular movements intact.     Conjunctiva/sclera: Conjunctivae normal.     Pupils: Pupils are equal, round, and reactive to light.  Cardiovascular:     Rate and Rhythm: Normal rate and regular rhythm.     Pulses: Normal pulses.     Heart sounds: Normal heart sounds. No murmur heard.   Pulmonary:     Effort: Pulmonary effort is normal. No respiratory distress.     Breath sounds: Normal breath sounds.  Abdominal:     General: There is no distension.     Palpations: Abdomen is soft.     Tenderness: There is no abdominal  tenderness.  Musculoskeletal:        General: No edema.     Cervical back: Normal range of motion and neck supple.  Skin:    General: Skin is warm and dry.     Capillary Refill: Capillary refill takes less than 2 seconds.  Neurological:     General: No focal deficit present.     Mental Status: She is alert.  Psychiatric:        Mood and Affect: Mood and affect normal.     ED Results / Procedures / Treatments   Labs (all labs ordered are listed, but only abnormal results are displayed) Labs Reviewed  CBC WITH DIFFERENTIAL/PLATELET - Abnormal; Notable for the following components:      Result Value   WBC 3.9 (*)    MCV 76.0 (*)    MCH 24.6 (*)    All other components within normal limits  COMPREHENSIVE METABOLIC PANEL - Abnormal; Notable for the following components:   Potassium 3.4 (*)    Calcium 8.1 (*)    All other components within normal limits  LIPASE, BLOOD    EKG None  Radiology No results found.  Procedures Procedures (including critical care time)  Medications Ordered in ED Medications  sodium chloride 0.9 % bolus 1,000 mL (1,000 mLs Intravenous New Bag/Given 07/05/20 2126)  prochlorperazine (COMPAZINE) injection 10 mg (10 mg Intravenous Given 07/05/20 2127)  diphenhydrAMINE (BENADRYL) injection 25 mg (25 mg Intravenous Given 07/05/20 2127)    ED Course  I have reviewed the triage vital signs and the nursing notes.  Pertinent labs & imaging results that were available during my care of  the patient were reviewed by me and considered in my medical decision making (see chart for details).    MDM Rules/Calculators/A&P                          Andrea Daniels is a 30 year old female with recent diagnosis of Covid having mostly GI symptoms who presents the ED with ongoing nausea vomiting.  Patient with overall unremarkable vitals.  No fever.  Denies any respiratory symptoms.  Pain mostly in the epigastric region but when she mostly vomits.  She has no real  focal abdominal tenderness on exam.  Abdomen is soft.  No concern for appendicitis or bowel obstruction.  Overall suspect Covid related illness.  Zofran has been helping at home but still having sometimes difficulty tolerating p.o.  We will get basic labs including normal saline bolus, IV Compazine, IV Benadryl and reevaluate.  Mostly will focus on symptomatic control.  Patient feeling better after IV fluids and IV Compazine.  Lab work was unremarkable.  Discharged in good condition.  No concern for intra-abdominal process.  Likely sequelae of Covid.  This chart was dictated using voice recognition software.  Despite best efforts to proofread,  errors can occur which can change the documentation meaning.   Final Clinical Impression(s) / ED Diagnoses Final diagnoses:  Nausea and vomiting, intractability of vomiting not specified, unspecified vomiting type    Rx / DC Orders ED Discharge Orders    None       Virgina Norfolk, DO 07/05/20 2243

## 2020-12-22 ENCOUNTER — Other Ambulatory Visit: Payer: Self-pay

## 2020-12-22 ENCOUNTER — Encounter (HOSPITAL_COMMUNITY): Payer: Self-pay

## 2020-12-22 ENCOUNTER — Emergency Department (HOSPITAL_COMMUNITY)
Admission: EM | Admit: 2020-12-22 | Discharge: 2020-12-22 | Disposition: A | Discharge status: Home / Self Care | Payer: BLUE CROSS/BLUE SHIELD | Attending: Emergency Medicine | Admitting: Emergency Medicine

## 2020-12-22 DIAGNOSIS — Z20822 Contact with and (suspected) exposure to covid-19: Secondary | ICD-10-CM | POA: Insufficient documentation

## 2020-12-22 DIAGNOSIS — Z87891 Personal history of nicotine dependence: Secondary | ICD-10-CM | POA: Diagnosis not present

## 2020-12-22 DIAGNOSIS — Z79899 Other long term (current) drug therapy: Secondary | ICD-10-CM | POA: Insufficient documentation

## 2020-12-22 DIAGNOSIS — I10 Essential (primary) hypertension: Secondary | ICD-10-CM | POA: Insufficient documentation

## 2020-12-22 DIAGNOSIS — J4541 Moderate persistent asthma with (acute) exacerbation: Secondary | ICD-10-CM | POA: Insufficient documentation

## 2020-12-22 DIAGNOSIS — Z7951 Long term (current) use of inhaled steroids: Secondary | ICD-10-CM | POA: Diagnosis not present

## 2020-12-22 DIAGNOSIS — J45909 Unspecified asthma, uncomplicated: Secondary | ICD-10-CM | POA: Diagnosis present

## 2020-12-22 LAB — CBC WITH DIFFERENTIAL/PLATELET
Abs Immature Granulocytes: 0.03 10*3/uL (ref 0.00–0.07)
Basophils Absolute: 0.1 10*3/uL (ref 0.0–0.1)
Basophils Relative: 1 %
Eosinophils Absolute: 0.5 10*3/uL (ref 0.0–0.5)
Eosinophils Relative: 5 %
HCT: 37.6 % (ref 36.0–46.0)
Hemoglobin: 11.8 g/dL — ABNORMAL LOW (ref 12.0–15.0)
Immature Granulocytes: 0 %
Lymphocytes Relative: 18 %
Lymphs Abs: 1.9 10*3/uL (ref 0.7–4.0)
MCH: 23.9 pg — ABNORMAL LOW (ref 26.0–34.0)
MCHC: 31.4 g/dL (ref 30.0–36.0)
MCV: 76.3 fL — ABNORMAL LOW (ref 80.0–100.0)
Monocytes Absolute: 0.6 10*3/uL (ref 0.1–1.0)
Monocytes Relative: 6 %
Neutro Abs: 7.4 10*3/uL (ref 1.7–7.7)
Neutrophils Relative %: 70 %
Platelets: 383 10*3/uL (ref 150–400)
RBC: 4.93 MIL/uL (ref 3.87–5.11)
RDW: 15.9 % — ABNORMAL HIGH (ref 11.5–15.5)
WBC: 10.5 10*3/uL (ref 4.0–10.5)
nRBC: 0 % (ref 0.0–0.2)

## 2020-12-22 LAB — RESP PANEL BY RT-PCR (FLU A&B, COVID) ARPGX2
Influenza A by PCR: NEGATIVE
Influenza B by PCR: NEGATIVE
SARS Coronavirus 2 by RT PCR: NEGATIVE

## 2020-12-22 LAB — BASIC METABOLIC PANEL
Anion gap: 7 (ref 5–15)
BUN: 8 mg/dL (ref 6–20)
CO2: 22 mmol/L (ref 22–32)
Calcium: 8.9 mg/dL (ref 8.9–10.3)
Chloride: 109 mmol/L (ref 98–111)
Creatinine, Ser: 0.81 mg/dL (ref 0.44–1.00)
GFR, Estimated: >60 mL/min (ref >60)
Glucose, Bld: 122 mg/dL — ABNORMAL HIGH (ref 70–99)
Potassium: 3.6 mmol/L (ref 3.5–5.1)
Sodium: 138 mmol/L (ref 135–145)

## 2020-12-22 LAB — I-STAT BETA HCG BLOOD, ED (MC, WL, AP ONLY): I-stat hCG, quantitative: <5 m[IU]/mL (ref <5)

## 2020-12-22 MED ORDER — ALBUTEROL SULFATE HFA 108 (90 BASE) MCG/ACT IN AERS: 2.0000 | INHALATION_SPRAY | RESPIRATORY_TRACT | Status: DC | PRN

## 2020-12-22 MED ORDER — ALBUTEROL SULFATE HFA 108 (90 BASE) MCG/ACT IN AERS
2.0000 | INHALATION_SPRAY | Freq: Once | RESPIRATORY_TRACT | Status: AC
  Administered 2020-12-22: 2 via RESPIRATORY_TRACT
  Filled 2020-12-22: qty 6.7

## 2020-12-22 MED ORDER — IPRATROPIUM-ALBUTEROL 0.5-2.5 (3) MG/3ML IN SOLN
3.0000 mL | Freq: Once | RESPIRATORY_TRACT | Status: AC
  Administered 2020-12-22: 3 mL via RESPIRATORY_TRACT
  Filled 2020-12-22: qty 3

## 2020-12-22 MED ORDER — ALBUTEROL (5 MG/ML) CONTINUOUS INHALATION SOLN
10.0000 mg/h | INHALATION_SOLUTION | Freq: Once | RESPIRATORY_TRACT | Status: AC
  Administered 2020-12-22: 10 mg/h via RESPIRATORY_TRACT
  Filled 2020-12-22: qty 20

## 2020-12-22 MED ORDER — PREDNISONE 50 MG PO TABS: ORAL_TABLET | ORAL | 0 refills | Status: DC

## 2020-12-22 MED ORDER — PREDNISONE 20 MG PO TABS
60.0000 mg | ORAL_TABLET | Freq: Once | ORAL | Status: AC
  Administered 2020-12-22: 60 mg via ORAL
  Filled 2020-12-22: qty 3

## 2020-12-22 MED ORDER — ONDANSETRON 8 MG PO TBDP
8.0000 mg | ORAL_TABLET | Freq: Once | ORAL | Status: AC
  Administered 2020-12-22: 8 mg via ORAL
  Filled 2020-12-22: qty 1

## 2020-12-22 NOTE — ED Triage Notes (Signed)
Pt reports being exposed to mold and has had trouble breathing for few days. She reports using her inhaler without relief. Pt has hx of asthma.

## 2020-12-22 NOTE — ED Provider Notes (Signed)
Andrea Daniels Provider Note   CSN: [id number] Arrival date & time: 12/22/20  0134     History Chief Complaint  Patient presents with   Asthma    Andrea Daniels is a 30 y.o. female.  The history is provided by the patient and a friend.  Asthma This is a recurrent problem. The current episode started 2 days ago. The problem has been gradually worsening. Associated symptoms include shortness of breath. Nothing aggravates the symptoms. Nothing relieves the symptoms.  Patient with history of asthma presents with coughing and shortness of breath.  Patient reports for the past 2 days she has had increasing cough, wheezing and shortness of breath.  No hemoptysis.  Reports whitish phlegm when coughing.  No significant chest pain.  Reports previous admission to the ICU for severe asthma.   She reports she has mold and mildew in her house and she thinks that triggered her symptoms  Most of the history is provided by friend due to patient's shortness of breath Past Medical History:  Diagnosis Date   Asthma    Gastritis    Gastritis    Hypertension    Ovarian cyst    UTI (lower urinary tract infection)     Patient Active Problem List   Diagnosis Date Noted   Sleep disorder 02/04/2020   Essential hypertension 01/13/2020   Asthma exacerbation 01/12/2020   Cyst of left ovary 12/17/2017   Acute respiratory failure (HCC) 10/12/2016   Acute respiratory failure with hypoxia (HCC) 10/11/2016   Hypokalemia 10/11/2016   Lobar pneumonia (HCC)    CAP (community acquired pneumonia) 06/03/2016   Epigastric abdominal pain    Tachycardia 06/02/2016   Leukocytosis 06/02/2016   Community acquired pneumonia of right lung 06/02/2016   Asthma, moderate 06/02/2016   Hypoxia    Hypophosphatemia 03/20/2015   Low TSH level 03/20/2015   Metabolic acidosis 03/20/2015   Microcytic anemia 03/20/2015   Exacerbation of asthma 03/19/2015   Elevated glucose  07/03/2014   Asthma with exacerbation 10/22/2013   Hives 07/11/2013   Status asthmaticus 06/08/2013   Nausea vomiting and diarrhea 08/16/2012   Allergic rhinitis 04/24/2012   Preventative health care 08/14/2011    Past Surgical History:  Procedure Laterality Date   NO PAST SURGERIES       OB History     Gravida  0   Para      Term      Preterm      AB      Living         SAB      IAB      Ectopic      Multiple      Live Births              Family History  Problem Relation Age of Onset   Arthritis Other    Cancer Other        breast cancer   Hypertension Other    Asthma Other    Hyperlipidemia Other    Hypertension Other    Diabetes Other    Arthritis Mother    Hearing loss Mother    Hypertension Mother    Arthritis Father    Hypertension Father    Early death Brother    Arthritis Maternal Grandmother    Asthma Maternal Grandmother    Diabetes Maternal Grandmother    Hearing loss Maternal Grandmother    Hypertension Maternal Grandmother    Breast cancer Maternal  Grandmother    Congestive Heart Failure Maternal Grandmother    Kidney disease Paternal Grandmother    Stroke Paternal Grandfather    Hypertension Maternal Grandfather     Social History   Tobacco Use   Smoking status: Former    Packs/day: 0.10    Pack years: 0.00    Types: Cigarettes   Smokeless tobacco: Never  Vaping Use   Vaping Use: Former   Substances: Nicotine  Substance Use Topics   Alcohol use: Yes    Comment: less than once a week   Drug use: Yes    Types: Marijuana    Comment: Edible    Home Medications Prior to Admission medications   Medication Sig Start Date End Date Taking? Authorizing Provider  albuterol (PROVENTIL HFA;VENTOLIN HFA) 108 (90 Base) MCG/ACT inhaler Inhale 1-2 puffs into the lungs every 6 (six) hours as needed for wheezing or shortness of breath. 05/08/18   Mliss Sax, MD  albuterol (VENTOLIN HFA) 108 (90 Base) MCG/ACT  inhaler Inhale 1-2 puffs into the lungs every 6 (six) hours as needed for wheezing or shortness of breath. 07/04/20   Maxwell Caul, PA-C  amLODipine (NORVASC) 5 MG tablet Take 1 tablet (5 mg total) by mouth daily. 02/24/19   Horton, Mayer Masker, MD  aspirin-acetaminophen-caffeine (EXCEDRIN MIGRAINE) [phone] MG tablet Take 2 tablets by mouth every 6 (six) hours as needed for headache.    [provider]  fluticasone (FLONASE) 50 MCG/ACT nasal spray Place 2 sprays into both nostrils daily. Patient not taking: Reported on 05/20/2020 09/18/17   Mliss Sax, MD  guaiFENesin (ROBITUSSIN) 100 MG/5ML SOLN Take 5 mLs (100 mg total) by mouth every 4 (four) hours as needed for cough or to loosen phlegm. 01/14/20   Rhetta Mura, MD  hydrOXYzine (ATARAX/VISTARIL) 25 MG tablet Take 1 tablet (25 mg total) by mouth 2 (two) times daily as needed for anxiety or itching. 01/14/20   Rhetta Mura, MD  labetalol (NORMODYNE) 100 MG tablet Take 100 mg by mouth once.  05/20/20   [provider]  meloxicam (MOBIC) 7.5 MG tablet Take 1-2 tablets (7.5-15 mg total) by mouth daily. 12/17/17   Cristina Gong, PA-C  mometasone-formoterol (DULERA) 200-5 MCG/ACT AERO Inhale 2 puffs into the lungs 2 (two) times daily. 01/14/20   Rhetta Mura, MD  montelukast (SINGULAIR) 10 MG tablet Take 1 tablet (10 mg total) by mouth at bedtime. 01/14/20   Rhetta Mura, MD  nitroGLYCERIN (NITROSTAT) 0.4 MG SL tablet Place 0.4 mg under the tongue every 5 (five) minutes as needed for chest pain.  05/20/20   [provider]  ondansetron (ZOFRAN ODT) 4 MG disintegrating tablet Take 1 tablet (4 mg total) by mouth every 8 (eight) hours as needed for nausea or vomiting. 07/04/20   Maxwell Caul, PA-C  OVER THE COUNTER MEDICATION Take 1 packet by mouth See admin instructions. Pt drinks a supplement once a week on sundays consisting of  Elderberry,zinc,tumeric and black pepper     [provider]  Vitamin D, Cholecalciferol, 25 MCG (1000 UT) TABS Take 1 tablet by mouth once a week. saturday    [provider]    Allergies    Doxycycline  Review of Systems   Review of Systems  Constitutional:  Negative for fever.  Respiratory:  Positive for shortness of breath.   Cardiovascular:  Negative for leg swelling.  Gastrointestinal:  Positive for nausea.  All other systems reviewed and are negative.  Physical Exam Updated  Vital Signs BP (!) 145/98   Pulse (!) 110   Temp 98.5 F (36.9 C) (Oral)   Resp (!) 22   Ht 1.499 m (4\' 11" )   Wt 77.6 kg   SpO2 98%   BMI 34.54 kg/m   Physical Exam CONSTITUTIONAL: Well developed/well nourished HEAD: Normocephalic/atraumatic EYES: EOMI/PERRL ENMT: Mucous membranes moist, uvula midline, no angioedema, no stridor NECK: supple no meningeal signs SPINE/BACK:entire spine nontender CV: S1/S2 noted, tachycardic LUNGS: Tachypneic and wheezing bilaterally ABDOMEN: soft, nontender, no rebound or guarding, bowel sounds noted throughout abdomen GU:no cva tenderness NEURO: Pt is awake/alert/appropriate, moves all extremitiesx4.  No facial droop.   EXTREMITIES: pulses normal/equal, full ROM, no lower extremity edema noted SKIN: warm, color normal PSYCH: Mildly anxious  ED Results / Procedures / Treatments   Labs (all labs ordered are listed, but only abnormal results are displayed) Labs Reviewed  BASIC METABOLIC PANEL - Abnormal; Notable for the following components:      Result Value   Glucose, Bld 122 (*)    All other components within normal limits  CBC WITH DIFFERENTIAL/PLATELET - Abnormal; Notable for the following components:   Hemoglobin 11.8 (*)    MCV 76.3 (*)    MCH 23.9 (*)    RDW 15.9 (*)    All other components within normal limits  RESP PANEL BY RT-PCR (FLU A&B, COVID) ARPGX2  I-STAT BETA HCG BLOOD, ED ([facility], [facility], [facility] ONLY)    EKG None  Radiology No results  found.  Procedures Procedures   Medications Ordered in ED Medications  albuterol (VENTOLIN HFA) 108 (90 Base) MCG/ACT inhaler 2 puff (has no administration in time range)  ipratropium-albuterol (DUONEB) 0.5-2.5 (3) MG/3ML nebulizer solution 3 mL (3 mLs Nebulization Given 12/22/20 0200)  ipratropium-albuterol (DUONEB) 0.5-2.5 (3) MG/3ML nebulizer solution 3 mL (3 mLs Nebulization Given 12/22/20 0221)  ondansetron (ZOFRAN-ODT) disintegrating tablet 8 mg (8 mg Oral Given 12/22/20 0359)  predniSONE (DELTASONE) tablet 60 mg (60 mg Oral Given 12/22/20 0358)  albuterol (PROVENTIL,VENTOLIN) solution continuous neb (10 mg/hr Nebulization Given 12/22/20 0420)    ED Course  I have reviewed the triage vital signs and the nursing notes.  Pertinent labs & imaging results that were available during my care of the patient were reviewed by me and considered in my medical decision making (see chart for details).    MDM Rules/Calculators/A&P                          3:41 AM Patient with history of asthma presents with increasing wheezing shortness of breath for the past 2 days she suspects is due to mold exposure.  By the time of my evaluation she had received albuterol, but was still wheezing. Plan for continuous neb as well as prednisone 6:49 AM After multiple neb treatments, patient has improved. Lung sounds are improved, only scattered wheeze remains.  Her work of breathing is improved.  Patient does have some tachycardia, but likely due to albuterol.  She was able to ambulate in the ER short distance with a steady gait.  Her heart rate improved upon standing.  No hypoxia  Overall patient is appropriate for discharge home.  Low suspicion for pneumonia/PE/CHF at this time. Patient reports she plans to leave her current residence due to the mold exposure.  She was placed on prednisone for 4 days.  Albuterol provided. Discussed need for follow with PCP.  We discussed strict return precautions  Final  Clinical Impression(s) / ED  Diagnoses Final diagnoses:  Moderate persistent asthma with exacerbation    Rx / DC Orders ED Discharge Orders          Ordered    predniSONE (DELTASONE) 50 MG tablet        12/22/20 0645             Zadie Rhine, MD 12/22/20 [phone]

## 2021-08-11 ENCOUNTER — Emergency Department (HOSPITAL_BASED_OUTPATIENT_CLINIC_OR_DEPARTMENT_OTHER)
Admission: EM | Admit: 2021-08-11 | Discharge: 2021-08-11 | Disposition: A | Discharge status: Home / Self Care | Payer: Self-pay | Attending: Emergency Medicine | Admitting: Emergency Medicine

## 2021-08-11 ENCOUNTER — Other Ambulatory Visit: Payer: Self-pay

## 2021-08-11 ENCOUNTER — Encounter (HOSPITAL_BASED_OUTPATIENT_CLINIC_OR_DEPARTMENT_OTHER): Payer: Self-pay | Admitting: Emergency Medicine

## 2021-08-11 ENCOUNTER — Emergency Department (HOSPITAL_BASED_OUTPATIENT_CLINIC_OR_DEPARTMENT_OTHER): Payer: Self-pay

## 2021-08-11 DIAGNOSIS — R0602 Shortness of breath: Secondary | ICD-10-CM | POA: Insufficient documentation

## 2021-08-11 DIAGNOSIS — R6883 Chills (without fever): Secondary | ICD-10-CM | POA: Insufficient documentation

## 2021-08-11 DIAGNOSIS — Z7952 Long term (current) use of systemic steroids: Secondary | ICD-10-CM | POA: Insufficient documentation

## 2021-08-11 DIAGNOSIS — J45901 Unspecified asthma with (acute) exacerbation: Secondary | ICD-10-CM

## 2021-08-11 DIAGNOSIS — J029 Acute pharyngitis, unspecified: Secondary | ICD-10-CM | POA: Insufficient documentation

## 2021-08-11 DIAGNOSIS — J45909 Unspecified asthma, uncomplicated: Secondary | ICD-10-CM | POA: Insufficient documentation

## 2021-08-11 DIAGNOSIS — R1110 Vomiting, unspecified: Secondary | ICD-10-CM | POA: Insufficient documentation

## 2021-08-11 DIAGNOSIS — R000 Tachycardia, unspecified: Secondary | ICD-10-CM

## 2021-08-11 DIAGNOSIS — Z20822 Contact with and (suspected) exposure to covid-19: Secondary | ICD-10-CM | POA: Insufficient documentation

## 2021-08-11 LAB — COMPREHENSIVE METABOLIC PANEL
ALT: 12 U/L (ref 0–44)
AST: 20 U/L (ref 15–41)
Albumin: 3.9 g/dL (ref 3.5–5.0)
Alkaline Phosphatase: 65 U/L (ref 38–126)
Anion gap: 10 (ref 5–15)
BUN: 10 mg/dL (ref 6–20)
CO2: 20 mmol/L — ABNORMAL LOW (ref 22–32)
Calcium: 8.4 mg/dL — ABNORMAL LOW (ref 8.9–10.3)
Chloride: 106 mmol/L (ref 98–111)
Creatinine, Ser: 0.85 mg/dL (ref 0.44–1.00)
GFR, Estimated: >60 mL/min (ref >60)
Glucose, Bld: 134 mg/dL — ABNORMAL HIGH (ref 70–99)
Potassium: 3.9 mmol/L (ref 3.5–5.1)
Sodium: 136 mmol/L (ref 135–145)
Total Bilirubin: 0.6 mg/dL (ref 0.3–1.2)
Total Protein: 7.7 g/dL (ref 6.5–8.1)

## 2021-08-11 LAB — CBC WITH DIFFERENTIAL/PLATELET
Abs Immature Granulocytes: 0.01 10*3/uL (ref 0.00–0.07)
Basophils Absolute: 0.1 10*3/uL (ref 0.0–0.1)
Basophils Relative: 1 %
Eosinophils Absolute: 0.1 10*3/uL (ref 0.0–0.5)
Eosinophils Relative: 1 %
HCT: 36.2 % (ref 36.0–46.0)
Hemoglobin: 11.8 g/dL — ABNORMAL LOW (ref 12.0–15.0)
Immature Granulocytes: 0 %
Lymphocytes Relative: 20 %
Lymphs Abs: 1.3 10*3/uL (ref 0.7–4.0)
MCH: 25.3 pg — ABNORMAL LOW (ref 26.0–34.0)
MCHC: 32.6 g/dL (ref 30.0–36.0)
MCV: 77.5 fL — ABNORMAL LOW (ref 80.0–100.0)
Monocytes Absolute: 0.2 10*3/uL (ref 0.1–1.0)
Monocytes Relative: 3 %
Neutro Abs: 5 10*3/uL (ref 1.7–7.7)
Neutrophils Relative %: 75 %
Platelets: 408 10*3/uL — ABNORMAL HIGH (ref 150–400)
RBC: 4.67 MIL/uL (ref 3.87–5.11)
RDW: 14.6 % (ref 11.5–15.5)
WBC: 6.7 10*3/uL (ref 4.0–10.5)
nRBC: 0 % (ref 0.0–0.2)

## 2021-08-11 LAB — RESP PANEL BY RT-PCR (FLU A&B, COVID) ARPGX2
Influenza A by PCR: NEGATIVE
Influenza B by PCR: NEGATIVE
SARS Coronavirus 2 by RT PCR: NEGATIVE

## 2021-08-11 LAB — PREGNANCY, URINE: Preg Test, Ur: NEGATIVE

## 2021-08-11 MED ORDER — SODIUM CHLORIDE 0.9 % IV BOLUS
1000.0000 mL | Freq: Once | INTRAVENOUS | Status: AC
  Administered 2021-08-11: 1000 mL via INTRAVENOUS

## 2021-08-11 MED ORDER — ALBUTEROL (5 MG/ML) CONTINUOUS INHALATION SOLN
15.0000 mg/h | INHALATION_SOLUTION | Freq: Once | RESPIRATORY_TRACT | Status: AC
  Administered 2021-08-11: 15 mg/h via RESPIRATORY_TRACT

## 2021-08-11 MED ORDER — METHYLPREDNISOLONE SODIUM SUCC 125 MG IJ SOLR
125.0000 mg | Freq: Once | INTRAMUSCULAR | Status: AC
  Administered 2021-08-11: 125 mg via INTRAVENOUS
  Filled 2021-08-11: qty 2

## 2021-08-11 MED ORDER — IPRATROPIUM BROMIDE 0.02 % IN SOLN
1.0000 mg | Freq: Once | RESPIRATORY_TRACT | Status: AC
  Administered 2021-08-11: 1 mg via RESPIRATORY_TRACT
  Filled 2021-08-11: qty 5

## 2021-08-11 MED ORDER — BENZONATATE 100 MG PO CAPS: 100.0000 mg | ORAL_CAPSULE | Freq: Three times a day (TID) | ORAL | 0 refills | Status: AC

## 2021-08-11 MED ORDER — ONDANSETRON HCL 4 MG PO TABS: 4.0000 mg | ORAL_TABLET | Freq: Four times a day (QID) | ORAL | 0 refills | Status: AC

## 2021-08-11 MED ORDER — ALBUTEROL (5 MG/ML) CONTINUOUS INHALATION SOLN
INHALATION_SOLUTION | RESPIRATORY_TRACT | Status: AC
  Filled 2021-08-11: qty 3

## 2021-08-11 MED ORDER — MAGNESIUM SULFATE 2 GM/50ML IV SOLN
2.0000 g | Freq: Once | INTRAVENOUS | Status: AC
  Administered 2021-08-11: 2 g via INTRAVENOUS
  Filled 2021-08-11: qty 50

## 2021-08-11 MED ORDER — ALBUTEROL (5 MG/ML) CONTINUOUS INHALATION SOLN
INHALATION_SOLUTION | RESPIRATORY_TRACT | Status: AC
  Administered 2021-08-11: 2.5 mg
  Filled 2021-08-11: qty 0.5

## 2021-08-11 MED ORDER — PREDNISONE 20 MG PO TABS: ORAL_TABLET | ORAL | 0 refills | Status: DC

## 2021-08-11 MED ORDER — ONDANSETRON HCL 4 MG/2ML IJ SOLN
4.0000 mg | Freq: Once | INTRAMUSCULAR | Status: AC
  Administered 2021-08-11: 4 mg via INTRAVENOUS
  Filled 2021-08-11: qty 2

## 2021-08-11 MED ORDER — IPRATROPIUM-ALBUTEROL 0.5-2.5 (3) MG/3ML IN SOLN
RESPIRATORY_TRACT | Status: AC
  Administered 2021-08-11: 3 mL
  Filled 2021-08-11: qty 3

## 2021-08-11 NOTE — ED Triage Notes (Signed)
Pt arrives pov, to triage in wheelchair with c/o N/V, and cough, sore throat & shob x 2 days, hx of asthma

## 2021-08-11 NOTE — Discharge Instructions (Addendum)
Take prednisone as prescribed. Take zofran for nausea and tessalon pearls for cough   Your heart rate will be elevated from using albuterol.  Please use albuterol every 4 hours as needed for cough and shortness of breath  Your COVID test and your labs and your x-rays were unremarkable today.  See your doctor for follow up this week   Return to ER if you have worse shortness of breath, cough, fever, trouble breathing

## 2021-08-11 NOTE — ED Provider Notes (Signed)
[facility] EMERGENCY DEPARTMENT Provider Note   CSN: [id number] Arrival date & time: 08/11/21  1633     History  Chief Complaint  Patient presents with   Shortness of Breath    Andrea Daniels is a 31 y.o. female history of asthma, here presenting with sore throat and shortness of breath and vomiting.  Patient states that she has been having short of breath for the last 2 to 3 days.  Patient states that she has some chills and sore throat as well.  She also started vomiting today.  Patient states that she has a history of asthma and was intubated several years ago.  She was told that she had black mold at that time.  She states that she moved to a different apartment and she was concern for black mold as well.  Denies any fevers or any COVID exposures.  Patient received 1 nebulizer prior to my exam and still feeling short of breath.  Patient had no recent travel or history of blood clots  The history is provided by the patient.      Home Medications Prior to Admission medications   Medication Sig Start Date End Date Taking? Authorizing Provider  albuterol (PROVENTIL HFA;VENTOLIN HFA) 108 (90 Base) MCG/ACT inhaler Inhale 1-2 puffs into the lungs every 6 (six) hours as needed for wheezing or shortness of breath. 05/08/18   Mliss Sax, MD  albuterol (VENTOLIN HFA) 108 (90 Base) MCG/ACT inhaler Inhale 1-2 puffs into the lungs every 6 (six) hours as needed for wheezing or shortness of breath. 07/04/20   Maxwell Caul, PA-C  amLODipine (NORVASC) 5 MG tablet Take 1 tablet (5 mg total) by mouth daily. 02/24/19   Horton, Mayer Masker, MD  aspirin-acetaminophen-caffeine (EXCEDRIN MIGRAINE) [phone] MG tablet Take 2 tablets by mouth every 6 (six) hours as needed for headache.    [provider]  fluticasone (FLONASE) 50 MCG/ACT nasal spray Place 2 sprays into both nostrils daily. Patient not taking: Reported on 05/20/2020 09/18/17   Mliss Sax, MD   guaiFENesin (ROBITUSSIN) 100 MG/5ML SOLN Take 5 mLs (100 mg total) by mouth every 4 (four) hours as needed for cough or to loosen phlegm. 01/14/20   Rhetta Mura, MD  hydrOXYzine (ATARAX/VISTARIL) 25 MG tablet Take 1 tablet (25 mg total) by mouth 2 (two) times daily as needed for anxiety or itching. 01/14/20   Rhetta Mura, MD  labetalol (NORMODYNE) 100 MG tablet Take 100 mg by mouth once.  05/20/20   [provider]  meloxicam (MOBIC) 7.5 MG tablet Take 1-2 tablets (7.5-15 mg total) by mouth daily. 12/17/17   Cristina Gong, PA-C  mometasone-formoterol (DULERA) 200-5 MCG/ACT AERO Inhale 2 puffs into the lungs 2 (two) times daily. 01/14/20   Rhetta Mura, MD  montelukast (SINGULAIR) 10 MG tablet Take 1 tablet (10 mg total) by mouth at bedtime. 01/14/20   Rhetta Mura, MD  nitroGLYCERIN (NITROSTAT) 0.4 MG SL tablet Place 0.4 mg under the tongue every 5 (five) minutes as needed for chest pain.  05/20/20   [provider]  ondansetron (ZOFRAN ODT) 4 MG disintegrating tablet Take 1 tablet (4 mg total) by mouth every 8 (eight) hours as needed for nausea or vomiting. 07/04/20   Maxwell Caul, PA-C  OVER THE COUNTER MEDICATION Take 1 packet by mouth See admin instructions. Pt drinks a supplement once a week on sundays consisting of  Elderberry,zinc,tumeric and black pepper    [provider]  predniSONE (  DELTASONE) 50 MG tablet 1 tablet PO QD X4 days 12/22/20   Zadie Rhine, MD  Vitamin D, Cholecalciferol, 25 MCG (1000 UT) TABS Take 1 tablet by mouth once a week. saturday    [provider]      Allergies    Doxycycline    Review of Systems   Review of Systems  Respiratory:  Positive for shortness of breath.   Gastrointestinal:  Positive for vomiting.  All other systems reviewed and are negative.  Physical Exam Updated Vital Signs BP (!) 151/106 (BP Location: Right Arm)    Pulse 94    Temp 98.7 F (37.1 C) (Oral)    Resp  (!) 28    Ht 4\' 11"  (1.499 m)    Wt 84.4 kg    LMP 07/26/2021    SpO2 98%    BMI 37.57 kg/m  Physical Exam Vitals and nursing note reviewed.  Constitutional:      Comments: Tachypneic  HENT:     Head: Normocephalic.     Mouth/Throat:     Mouth: Mucous membranes are moist.  Eyes:     Extraocular Movements: Extraocular movements intact.     Pupils: Pupils are equal, round, and reactive to light.  Cardiovascular:     Rate and Rhythm: Regular rhythm. Tachycardia present.  Pulmonary:     Comments: Tachypneic, diminished breath sounds bilaterally.  Mild wheezing bilaterally. Mild retractions, talking in short sentences  Abdominal:     General: Bowel sounds are normal.     Palpations: Abdomen is soft.  Musculoskeletal:        General: Normal range of motion.     Cervical back: Normal range of motion and neck supple.     Right lower leg: No tenderness. No edema.     Left lower leg: No tenderness. No edema.  Skin:    General: Skin is warm.     Capillary Refill: Capillary refill takes less than 2 seconds.  Neurological:     General: No focal deficit present.     Mental Status: She is oriented to person, place, and time.  Psychiatric:        Mood and Affect: Mood normal.        Behavior: Behavior normal.    ED Results / Procedures / Treatments   Labs (all labs ordered are listed, but only abnormal results are displayed) Labs Reviewed  RESP PANEL BY RT-PCR (FLU A&B, COVID) ARPGX2  CBC WITH DIFFERENTIAL/PLATELET  COMPREHENSIVE METABOLIC PANEL  PREGNANCY, URINE    EKG None  Radiology No results found.  Procedures Procedures    Medications Ordered in ED Medications  ondansetron (ZOFRAN) injection 4 mg (has no administration in time range)  sodium chloride 0.9 % bolus 1,000 mL (has no administration in time range)  methylPREDNISolone sodium succinate (SOLU-MEDROL) 125 mg/2 mL injection 125 mg (has no administration in time range)  magnesium sulfate IVPB 2 g 50 mL (has no  administration in time range)  albuterol (PROVENTIL,VENTOLIN) solution continuous neb (has no administration in time range)  albuterol (VENTOLIN) (5 MG/ML) 0.5% continuous inhalation solution (has no administration in time range)  ipratropium-albuterol (DUONEB) 0.5-2.5 (3) MG/3ML nebulizer solution (3 mLs  Given 08/11/21 1653)  albuterol (VENTOLIN) (5 MG/ML) 0.5% continuous inhalation solution (2.5 mg  Given 08/11/21 1653)  ipratropium (ATROVENT) nebulizer solution 1 mg (1 mg Nebulization Given 08/11/21 1723)    ED Course/ Medical Decision Making/ A&P  Medical Decision Making Ayvah L Durinda Buzzelli is a 31 y.o. female here presenting with shortness of breath.  Patient has a history of asthma and suspect asthma exacerbation.  Also consider COVID or pneumonia.  Patient was intubated previously but she is protecting her airway right now.  We will get CBC and CMP and COVID test.  We will also get a chest x-ray.  We will give continuous neb and Solu-Medrol and magnesium and hydrate patient.   6:30 pm Patient finished continuous neb and heart rate went up to the 130s. Patient's breathing better now.  Lungs are more clear and work of breathing has decreased.  8:06 PM Patient received another liter normal saline bolus and I reassessed her just now.  Patient's heart rate is down to around 110.  Patient is feeling much better and lungs remain clear.  At this point I think she stable for discharge.  Her COVID test is negative.  I reviewed her CBC and CMP and x-ray and they were unremarkable.  At this point patient stable for discharge and will discharge with a course of steroids.  She has an inhaler at home.   Problems Addressed: Moderate asthma with exacerbation, unspecified whether persistent: acute illness or injury Sinus tachycardia: acute illness or injury  Amount and/or Complexity of Data Reviewed Independent Historian: spouse External Data Reviewed: notes. Labs: ordered.  Decision-making details documented in ED Course. Radiology: ordered and independent interpretation performed. Decision-making details documented in ED Course. ECG/medicine tests: ordered and independent interpretation performed. Decision-making details documented in ED Course.  Risk Prescription drug management.  Final Clinical Impression(s) / ED Diagnoses Final diagnoses:  None    Rx / DC Orders ED Discharge Orders     None         Charlynne Pander, MD 08/11/21 2008

## 2023-02-28 ENCOUNTER — Other Ambulatory Visit: Payer: Self-pay

## 2023-02-28 ENCOUNTER — Encounter (HOSPITAL_COMMUNITY): Payer: Self-pay

## 2023-02-28 ENCOUNTER — Emergency Department (HOSPITAL_COMMUNITY)
Admission: EM | Admit: 2023-02-28 | Discharge: 2023-02-28 | Disposition: A | Discharge status: Home / Self Care | Payer: Self-pay | Attending: Emergency Medicine | Admitting: Emergency Medicine

## 2023-02-28 ENCOUNTER — Emergency Department (HOSPITAL_COMMUNITY): Payer: Self-pay

## 2023-02-28 DIAGNOSIS — J3489 Other specified disorders of nose and nasal sinuses: Secondary | ICD-10-CM

## 2023-02-28 DIAGNOSIS — I10 Essential (primary) hypertension: Secondary | ICD-10-CM | POA: Insufficient documentation

## 2023-02-28 DIAGNOSIS — J069 Acute upper respiratory infection, unspecified: Secondary | ICD-10-CM | POA: Insufficient documentation

## 2023-02-28 DIAGNOSIS — Z7951 Long term (current) use of inhaled steroids: Secondary | ICD-10-CM | POA: Insufficient documentation

## 2023-02-28 DIAGNOSIS — R0981 Nasal congestion: Secondary | ICD-10-CM

## 2023-02-28 DIAGNOSIS — Z79899 Other long term (current) drug therapy: Secondary | ICD-10-CM | POA: Insufficient documentation

## 2023-02-28 DIAGNOSIS — Z1152 Encounter for screening for COVID-19: Secondary | ICD-10-CM | POA: Insufficient documentation

## 2023-02-28 DIAGNOSIS — R112 Nausea with vomiting, unspecified: Secondary | ICD-10-CM | POA: Insufficient documentation

## 2023-02-28 DIAGNOSIS — J45901 Unspecified asthma with (acute) exacerbation: Secondary | ICD-10-CM | POA: Insufficient documentation

## 2023-02-28 DIAGNOSIS — Z7982 Long term (current) use of aspirin: Secondary | ICD-10-CM | POA: Insufficient documentation

## 2023-02-28 DIAGNOSIS — R197 Diarrhea, unspecified: Secondary | ICD-10-CM | POA: Insufficient documentation

## 2023-02-28 LAB — RESP PANEL BY RT-PCR (RSV, FLU A&B, COVID)  RVPGX2
Influenza A by PCR: NEGATIVE
Influenza B by PCR: NEGATIVE
Resp Syncytial Virus by PCR: NEGATIVE
SARS Coronavirus 2 by RT PCR: NEGATIVE

## 2023-02-28 LAB — HCG, SERUM, QUALITATIVE: Preg, Serum: NEGATIVE

## 2023-02-28 LAB — COMPREHENSIVE METABOLIC PANEL
ALT: 18 U/L (ref 0–44)
AST: 22 U/L (ref 15–41)
Albumin: 3.8 g/dL (ref 3.5–5.0)
Alkaline Phosphatase: 68 U/L (ref 38–126)
Anion gap: 12 (ref 5–15)
BUN: 6 mg/dL (ref 6–20)
CO2: 21 mmol/L — ABNORMAL LOW (ref 22–32)
Calcium: 8.5 mg/dL — ABNORMAL LOW (ref 8.9–10.3)
Chloride: 104 mmol/L (ref 98–111)
Creatinine, Ser: 0.94 mg/dL (ref 0.44–1.00)
GFR, Estimated: >60 mL/min (ref >60)
Glucose, Bld: 110 mg/dL — ABNORMAL HIGH (ref 70–99)
Potassium: 3.4 mmol/L — ABNORMAL LOW (ref 3.5–5.1)
Sodium: 137 mmol/L (ref 135–145)
Total Bilirubin: 0.8 mg/dL (ref 0.3–1.2)
Total Protein: 7.8 g/dL (ref 6.5–8.1)

## 2023-02-28 LAB — URINALYSIS, ROUTINE W REFLEX MICROSCOPIC
Bacteria, UA: NONE SEEN
Bilirubin Urine: NEGATIVE
Glucose, UA: NEGATIVE mg/dL
Ketones, ur: NEGATIVE mg/dL
Leukocytes,Ua: NEGATIVE
Nitrite: NEGATIVE
Protein, ur: NEGATIVE mg/dL
Specific Gravity, Urine: 1.021 (ref 1.005–1.030)
pH: 5 (ref 5.0–8.0)

## 2023-02-28 LAB — CBC
HCT: 33.3 % — ABNORMAL LOW (ref 36.0–46.0)
Hemoglobin: 10 g/dL — ABNORMAL LOW (ref 12.0–15.0)
MCH: 22.2 pg — ABNORMAL LOW (ref 26.0–34.0)
MCHC: 30 g/dL (ref 30.0–36.0)
MCV: 74 fL — ABNORMAL LOW (ref 80.0–100.0)
Platelets: 401 10*3/uL — ABNORMAL HIGH (ref 150–400)
RBC: 4.5 MIL/uL (ref 3.87–5.11)
RDW: 16 % — ABNORMAL HIGH (ref 11.5–15.5)
WBC: 6.1 10*3/uL (ref 4.0–10.5)
nRBC: 0 % (ref 0.0–0.2)

## 2023-02-28 LAB — LIPASE, BLOOD: Lipase: 31 U/L (ref 11–51)

## 2023-02-28 MED ORDER — KETOROLAC TROMETHAMINE 15 MG/ML IJ SOLN
15.0000 mg | Freq: Once | INTRAMUSCULAR | Status: AC
  Administered 2023-02-28: 15 mg via INTRAVENOUS
  Filled 2023-02-28: qty 1

## 2023-02-28 MED ORDER — PREDNISONE 20 MG PO TABS: 20.0000 mg | ORAL_TABLET | Freq: Every day | ORAL | 0 refills | Status: AC

## 2023-02-28 MED ORDER — SODIUM CHLORIDE 0.9 % IV BOLUS
1000.0000 mL | Freq: Once | INTRAVENOUS | Status: AC
  Administered 2023-02-28: 1000 mL via INTRAVENOUS

## 2023-02-28 MED ORDER — IPRATROPIUM-ALBUTEROL 0.5-2.5 (3) MG/3ML IN SOLN
3.0000 mL | Freq: Once | RESPIRATORY_TRACT | Status: AC
  Administered 2023-02-28: 3 mL via RESPIRATORY_TRACT

## 2023-02-28 MED ORDER — ALBUTEROL SULFATE HFA 108 (90 BASE) MCG/ACT IN AERS
1.0000 | INHALATION_SPRAY | Freq: Four times a day (QID) | RESPIRATORY_TRACT | 0 refills | Status: AC | PRN
Start: 2023-02-28 — End: ?

## 2023-02-28 MED ORDER — IPRATROPIUM-ALBUTEROL 0.5-2.5 (3) MG/3ML IN SOLN
3.0000 mL | Freq: Once | RESPIRATORY_TRACT | Status: AC
  Administered 2023-02-28: 3 mL via RESPIRATORY_TRACT
  Filled 2023-02-28: qty 3

## 2023-02-28 MED ORDER — METHYLPREDNISOLONE SODIUM SUCC 125 MG IJ SOLR
125.0000 mg | Freq: Once | INTRAMUSCULAR | Status: AC
  Administered 2023-02-28: 125 mg via INTRAVENOUS
  Filled 2023-02-28: qty 2

## 2023-02-28 MED ORDER — ONDANSETRON HCL 4 MG/2ML IJ SOLN
4.0000 mg | Freq: Once | INTRAMUSCULAR | Status: AC
  Administered 2023-02-28: 4 mg via INTRAVENOUS
  Filled 2023-02-28: qty 2

## 2023-02-28 NOTE — ED Provider Notes (Signed)
[place] EMERGENCY DEPARTMENT AT [facility] Provider Note   CSN: [id number] Arrival date & time: 02/28/23  2952     History  No chief complaint on file.   Andrea Daniels is a 32 y.o. female.  Patient is a 32 year old female with past medical history of asthma hypertension, and gastritis presenting for complaints of fevers.  Patient mid to fevers, chills, nausea, vomiting, generalized abdominal pain, coughing, nasal congestion, and rhinorrhea that started approximately 3 days ago.  Nuys any Motrin or Tylenol prior to arrival.  Did not take temperature prior to arrival.  The history is provided by the patient. No language interpreter was used.       Home Medications Prior to Admission medications   Medication Sig Start Date End Date Taking? Authorizing Provider  albuterol (VENTOLIN HFA) 108 (90 Base) MCG/ACT inhaler Inhale 1-2 puffs into the lungs every 6 (six) hours as needed for wheezing or shortness of breath. 02/28/23  Yes Edwin Dada P, DO  predniSONE (DELTASONE) 20 MG tablet Take 1 tablet (20 mg total) by mouth daily for 5 days. 02/28/23 03/05/23 Yes Edwin Dada P, DO  amLODipine (NORVASC) 5 MG tablet Take 1 tablet (5 mg total) by mouth daily. 02/24/19   Horton, Mayer Masker, MD  aspirin-acetaminophen-caffeine (EXCEDRIN MIGRAINE) [phone] MG tablet Take 2 tablets by mouth every 6 (six) hours as needed for headache.    [provider]  benzonatate (TESSALON) 100 MG capsule Take 1 capsule (100 mg total) by mouth every 8 (eight) hours. 08/11/21   Charlynne Pander, MD  fluticasone Aleda Grana) 50 MCG/ACT nasal spray Place 2 sprays into both nostrils daily. Patient not taking: Reported on 05/20/2020 09/18/17   Mliss Sax, MD  guaiFENesin (ROBITUSSIN) 100 MG/5ML SOLN Take 5 mLs (100 mg total) by mouth every 4 (four) hours as needed for cough or to loosen phlegm. 01/14/20   Rhetta Mura, MD  hydrOXYzine (ATARAX/VISTARIL) 25 MG tablet Take 1 tablet  (25 mg total) by mouth 2 (two) times daily as needed for anxiety or itching. 01/14/20   Rhetta Mura, MD  labetalol (NORMODYNE) 100 MG tablet Take 100 mg by mouth once.  05/20/20   [provider]  meloxicam (MOBIC) 7.5 MG tablet Take 1-2 tablets (7.5-15 mg total) by mouth daily. 12/17/17   Cristina Gong, PA-C  mometasone-formoterol (DULERA) 200-5 MCG/ACT AERO Inhale 2 puffs into the lungs 2 (two) times daily. 01/14/20   Rhetta Mura, MD  montelukast (SINGULAIR) 10 MG tablet Take 1 tablet (10 mg total) by mouth at bedtime. 01/14/20   Rhetta Mura, MD  nitroGLYCERIN (NITROSTAT) 0.4 MG SL tablet Place 0.4 mg under the tongue every 5 (five) minutes as needed for chest pain.  05/20/20   [provider]  ondansetron (ZOFRAN ODT) 4 MG disintegrating tablet Take 1 tablet (4 mg total) by mouth every 8 (eight) hours as needed for nausea or vomiting. 07/04/20   Maxwell Caul, PA-C  ondansetron (ZOFRAN) 4 MG tablet Take 1 tablet (4 mg total) by mouth every 6 (six) hours. 08/11/21   Charlynne Pander, MD  OVER THE COUNTER MEDICATION Take 1 packet by mouth See admin instructions. Pt drinks a supplement once a week on sundays consisting of  Elderberry,zinc,tumeric and black pepper    [provider]  Vitamin D, Cholecalciferol, 25 MCG (1000 UT) TABS Take 1 tablet by mouth once a week. saturday    [provider]      Allergies  Doxycycline    Review of Systems   Review of Systems  Constitutional:  Positive for chills and fever.  HENT:  Positive for congestion and rhinorrhea. Negative for ear pain and sore throat.   Eyes:  Negative for pain and visual disturbance.  Respiratory:  Positive for cough, shortness of breath and wheezing.   Cardiovascular:  Negative for chest pain and palpitations.  Gastrointestinal:  Positive for abdominal pain, diarrhea, nausea and vomiting. Negative for constipation.  Genitourinary:  Negative for dysuria and  hematuria.  Musculoskeletal:  Negative for arthralgias and back pain.  Skin:  Negative for color change and rash.  Neurological:  Negative for seizures and syncope.  All other systems reviewed and are negative.   Physical Exam Updated Vital Signs BP (!) 156/102   Pulse (!) 113   Temp 99.7 F (37.6 C) (Oral)   Resp 20   LMP 02/20/2023   SpO2 100%  Physical Exam Vitals and nursing note reviewed.  Constitutional:      General: She is not in acute distress.    Appearance: She is well-developed.  HENT:     Head: Normocephalic and atraumatic.  Eyes:     Conjunctiva/sclera: Conjunctivae normal.  Cardiovascular:     Rate and Rhythm: Normal rate and regular rhythm.     Heart sounds: No murmur heard. Pulmonary:     Effort: Pulmonary effort is normal. No respiratory distress.     Breath sounds: Normal breath sounds.  Abdominal:     Palpations: Abdomen is soft.     Tenderness: There is no abdominal tenderness.  Musculoskeletal:        General: No swelling.     Cervical back: Neck supple.  Skin:    General: Skin is warm and dry.     Capillary Refill: Capillary refill takes less than 2 seconds.  Neurological:     Mental Status: She is alert.  Psychiatric:        Mood and Affect: Mood normal.     ED Results / Procedures / Treatments   Labs (all labs ordered are listed, but only abnormal results are displayed) Labs Reviewed  COMPREHENSIVE METABOLIC PANEL - Abnormal; Notable for the following components:      Result Value   Potassium 3.4 (*)    CO2 21 (*)    Glucose, Bld 110 (*)    Calcium 8.5 (*)    All other components within normal limits  CBC - Abnormal; Notable for the following components:   Hemoglobin 10.0 (*)    HCT 33.3 (*)    MCV 74.0 (*)    MCH 22.2 (*)    RDW 16.0 (*)    Platelets 401 (*)    All other components within normal limits  URINALYSIS, ROUTINE W REFLEX MICROSCOPIC - Abnormal; Notable for the following components:   Hgb urine dipstick MODERATE  (*)    All other components within normal limits  RESP PANEL BY RT-PCR (RSV, FLU A&B, COVID)  RVPGX2  LIPASE, BLOOD  HCG, SERUM, QUALITATIVE    EKG EKG Interpretation Date/Time:  Tuesday February 28 2023 11:09:45 EDT Ventricular Rate:  103 PR Interval:  171 QRS Duration:  75 QT Interval:  323 QTC Calculation: 423 R Axis:   63  Text Interpretation: Sinus tachycardia Confirmed by Edwin Dada ([id number]) on 02/28/2023 11:11:16 AM  Radiology DG Chest 2 View  Result Date: 02/28/2023 CLINICAL DATA:  Cough. Runny nose. Body aches. Fever. Starting yesterday. EXAM: CHEST - 2 VIEW COMPARISON:  Chest radiographs  08/11/2021 and 05/20/2020 FINDINGS: Cardiac silhouette and mediastinal contours are within normal limits. The lungs are clear. No pleural effusion or pneumothorax. No acute skeletal abnormality. IMPRESSION: No active cardiopulmonary disease. Electronically Signed   By: Neita Garnet M.D.   On: 02/28/2023 10:57    Procedures Procedures    Medications Ordered in ED Medications  sodium chloride 0.9 % bolus 1,000 mL (0 mLs Intravenous Stopped 02/28/23 1245)  ondansetron (ZOFRAN) injection 4 mg (4 mg Intravenous Given 02/28/23 1124)  ketorolac (TORADOL) 15 MG/ML injection 15 mg (15 mg Intravenous Given 02/28/23 1126)  ipratropium-albuterol (DUONEB) 0.5-2.5 (3) MG/3ML nebulizer solution 3 mL (3 mLs Nebulization Given 02/28/23 1128)  methylPREDNISolone sodium succinate (SOLU-MEDROL) 125 mg/2 mL injection 125 mg (125 mg Intravenous Given 02/28/23 1127)  ipratropium-albuterol (DUONEB) 0.5-2.5 (3) MG/3ML nebulizer solution 3 mL (3 mLs Nebulization Given 02/28/23 1215)    ED Course/ Medical Decision Making/ A&P                                 Medical Decision Making Amount and/or Complexity of Data Reviewed Labs: ordered. Radiology: ordered.  Risk Prescription drug management.    32 year old female with past medical history of asthma hypertension, and gastritis presenting for complaints of fevers.   Is alert and oriented x 3, hypertensive at 164/109, tachycardia to 102, with otherwise stable vital signs.  Temperature 99.7 F.  Physical exam demonstrates a nontoxic appearing patient.  She is equal bilateral breath sounds with wheezing throughout.  History of asthma.  DuoNeb ordered.  Solu-Medrol ordered.  Laboratory studies demonstrate no leukocytosis.  No sepsis at this time.  Chest x-ray stable without focal pneumonia.  COVID pending.  On reevaluation patient is still wheezing.  Repeat DuoNebs given.  Evaluation patient has significant improvement of symptoms.  Using improved.  Myalgias improved.  Vital signs improved.  There is no leukocytosis.  No sepsis at this time.  Chest x-ray demonstrates no focal pneumonias.  COVID and influenza negative.  Abdomen remains soft and nontender.  UA demonstrates no UTI.  No signs of sepsis at this time.  Patient is overall nontoxic and well-appearing.  No neck rigidity or stiffness.  No signs of meningitis.  Array of symptoms likely viral nature.  Patient recommended for Motrin Tylenol for fevers or chills, rest, increase hydration, and Zofran for nausea or vomiting.  Asthma exacerbation precipitated by viral syndrome.  Albuterol inhaler and steroid sent to pharmacy.  Patient in no distress and overall condition improved here in the ED. Detailed discussions were had with the patient regarding current findings, and need for close f/u with PCP or on call doctor. The patient has been instructed to return immediately if the symptoms worsen in any way for re-evaluation. Patient verbalized understanding and is in agreement with current care plan. All questions answered prior to discharge.        Final Clinical Impression(s) / ED Diagnoses Final diagnoses:  Exacerbation of asthma, unspecified asthma severity, unspecified whether persistent  Viral URI with cough  Diarrhea, unspecified type  Nausea and vomiting, unspecified vomiting type  Nasal congestion   Rhinorrhea    Rx / DC Orders ED Discharge Orders          Ordered    albuterol (VENTOLIN HFA) 108 (90 Base) MCG/ACT inhaler  Every 6 hours PRN        02/28/23 1246    predniSONE (DELTASONE) 20 MG tablet  Daily  02/28/23 1246              Franne Forts, DO 02/28/23 1247

## 2023-02-28 NOTE — ED Triage Notes (Addendum)
Pt c/o cold sweats, fever, body aches, N/D, cough, nasal drainage, HA; denies known sick contacts
